# Patient Record
Sex: Female | Born: 1940 | Race: Black or African American | Hispanic: No | Smoking: Former smoker
Health system: Southern US, Community
[De-identification: ages and names within clinical notes are randomized; demographics above are authoritative.]

## PROBLEM LIST (undated history)

## (undated) DIAGNOSIS — K209 Esophagitis, unspecified without bleeding: Secondary | ICD-10-CM

## (undated) DIAGNOSIS — D860 Sarcoidosis of lung: Secondary | ICD-10-CM

## (undated) DIAGNOSIS — J449 Chronic obstructive pulmonary disease, unspecified: Secondary | ICD-10-CM

## (undated) DIAGNOSIS — E119 Type 2 diabetes mellitus without complications: Secondary | ICD-10-CM

## (undated) DIAGNOSIS — E669 Obesity, unspecified: Secondary | ICD-10-CM

## (undated) DIAGNOSIS — D649 Anemia, unspecified: Secondary | ICD-10-CM

## (undated) DIAGNOSIS — M1990 Unspecified osteoarthritis, unspecified site: Secondary | ICD-10-CM

## (undated) DIAGNOSIS — I509 Heart failure, unspecified: Secondary | ICD-10-CM

## (undated) DIAGNOSIS — M51369 Other intervertebral disc degeneration, lumbar region without mention of lumbar back pain or lower extremity pain: Secondary | ICD-10-CM

## (undated) DIAGNOSIS — N189 Chronic kidney disease, unspecified: Secondary | ICD-10-CM

## (undated) DIAGNOSIS — I10 Essential (primary) hypertension: Secondary | ICD-10-CM

## (undated) DIAGNOSIS — R0600 Dyspnea, unspecified: Secondary | ICD-10-CM

## (undated) DIAGNOSIS — H269 Unspecified cataract: Secondary | ICD-10-CM

## (undated) DIAGNOSIS — G4730 Sleep apnea, unspecified: Secondary | ICD-10-CM

## (undated) DIAGNOSIS — M109 Gout, unspecified: Secondary | ICD-10-CM

## (undated) DIAGNOSIS — M5136 Other intervertebral disc degeneration, lumbar region: Secondary | ICD-10-CM

## (undated) HISTORY — PX: OTHER SURGICAL HISTORY: SHX169

## (undated) HISTORY — DX: Type 2 diabetes mellitus without complications: E11.9

## (undated) HISTORY — PX: CATARACT EXTRACTION: SUR2

## (undated) HISTORY — DX: Sarcoidosis of lung: D86.0

## (undated) HISTORY — DX: Esophagitis, unspecified: K20.9

## (undated) HISTORY — DX: Unspecified osteoarthritis, unspecified site: M19.90

## (undated) HISTORY — DX: Unspecified cataract: H26.9

## (undated) HISTORY — PX: OOPHORECTOMY: SHX86

## (undated) HISTORY — DX: Gout, unspecified: M10.9

## (undated) HISTORY — DX: Essential (primary) hypertension: I10

## (undated) HISTORY — DX: Esophagitis, unspecified without bleeding: K20.90

## (undated) HISTORY — DX: Sleep apnea, unspecified: G47.30

## (undated) HISTORY — DX: Obesity, unspecified: E66.9

## (undated) HISTORY — DX: Anemia, unspecified: D64.9

## (undated) HISTORY — DX: Chronic obstructive pulmonary disease, unspecified: J44.9

## (undated) HISTORY — PX: ABDOMINAL HYSTERECTOMY: SHX81

## (undated) HISTORY — DX: Heart failure, unspecified: I50.9

## (undated) HISTORY — PX: FOOT SURGERY: SHX648

---

## 2003-01-09 ENCOUNTER — Encounter: Admission: RE | Admit: 2003-01-09 | Discharge: 2003-01-09 | Payer: Self-pay | Admitting: Internal Medicine

## 2003-04-21 ENCOUNTER — Ambulatory Visit (HOSPITAL_BASED_OUTPATIENT_CLINIC_OR_DEPARTMENT_OTHER): Admission: RE | Admit: 2003-04-21 | Discharge: 2003-04-21 | Payer: Self-pay | Admitting: Pulmonary Disease

## 2003-08-17 ENCOUNTER — Encounter: Admission: RE | Admit: 2003-08-17 | Discharge: 2003-08-17 | Payer: Self-pay | Admitting: Anesthesiology

## 2004-02-21 ENCOUNTER — Ambulatory Visit: Payer: Self-pay | Admitting: Pulmonary Disease

## 2004-05-08 ENCOUNTER — Encounter: Admission: RE | Admit: 2004-05-08 | Discharge: 2004-05-08 | Payer: Self-pay | Admitting: Internal Medicine

## 2005-03-12 ENCOUNTER — Ambulatory Visit: Payer: Self-pay | Admitting: Pulmonary Disease

## 2005-08-04 ENCOUNTER — Ambulatory Visit: Payer: Self-pay | Admitting: Internal Medicine

## 2005-11-02 ENCOUNTER — Ambulatory Visit: Payer: Self-pay | Admitting: Unknown Physician Specialty

## 2005-11-02 HISTORY — PX: COLONOSCOPY: SHX174

## 2006-09-30 ENCOUNTER — Ambulatory Visit: Payer: Self-pay | Admitting: Internal Medicine

## 2007-06-02 ENCOUNTER — Encounter: Payer: Self-pay | Admitting: Internal Medicine

## 2007-10-04 ENCOUNTER — Ambulatory Visit: Payer: Self-pay | Admitting: Internal Medicine

## 2008-11-22 ENCOUNTER — Ambulatory Visit: Payer: Self-pay | Admitting: Internal Medicine

## 2008-12-21 ENCOUNTER — Ambulatory Visit: Payer: Self-pay | Admitting: Internal Medicine

## 2010-02-01 ENCOUNTER — Encounter: Payer: Self-pay | Admitting: Internal Medicine

## 2010-03-25 ENCOUNTER — Ambulatory Visit: Payer: Self-pay | Admitting: Otolaryngology

## 2011-02-13 ENCOUNTER — Ambulatory Visit: Payer: Self-pay | Admitting: Family Medicine

## 2011-02-24 ENCOUNTER — Ambulatory Visit: Payer: Self-pay | Admitting: Internal Medicine

## 2011-02-27 LAB — PATHOLOGY REPORT

## 2011-03-03 ENCOUNTER — Ambulatory Visit: Payer: Self-pay | Admitting: Cardiothoracic Surgery

## 2011-03-05 ENCOUNTER — Ambulatory Visit: Payer: Self-pay | Admitting: Cardiothoracic Surgery

## 2011-03-13 ENCOUNTER — Ambulatory Visit: Payer: Self-pay | Admitting: Cardiothoracic Surgery

## 2013-01-23 ENCOUNTER — Ambulatory Visit: Payer: Self-pay | Admitting: Family Medicine

## 2013-06-19 DIAGNOSIS — E119 Type 2 diabetes mellitus without complications: Secondary | ICD-10-CM | POA: Insufficient documentation

## 2013-06-19 DIAGNOSIS — I10 Essential (primary) hypertension: Secondary | ICD-10-CM | POA: Insufficient documentation

## 2013-07-17 ENCOUNTER — Ambulatory Visit: Payer: Self-pay | Admitting: Family Medicine

## 2014-01-08 ENCOUNTER — Emergency Department: Payer: Self-pay | Admitting: Emergency Medicine

## 2014-01-08 LAB — HEPATIC FUNCTION PANEL A (ARMC)
Albumin: 3 g/dL — ABNORMAL LOW (ref 3.4–5.0)
Alkaline Phosphatase: 130 U/L — ABNORMAL HIGH
Bilirubin, Direct: <0.1 mg/dL (ref 0.0–0.2)
Bilirubin,Total: 0.5 mg/dL (ref 0.2–1.0)
SGOT(AST): 17 U/L (ref 15–37)
SGPT (ALT): 54 U/L
Total Protein: 6.3 g/dL — ABNORMAL LOW (ref 6.4–8.2)

## 2014-01-08 LAB — BASIC METABOLIC PANEL
Anion Gap: 8 (ref 7–16)
BUN: 29 mg/dL — ABNORMAL HIGH (ref 7–18)
Calcium, Total: 8.3 mg/dL — ABNORMAL LOW (ref 8.5–10.1)
Chloride: 103 mmol/L (ref 98–107)
Co2: 27 mmol/L (ref 21–32)
Creatinine: 1.66 mg/dL — ABNORMAL HIGH (ref 0.60–1.30)
EGFR (African American): 39 — ABNORMAL LOW
EGFR (Non-African Amer.): 32 — ABNORMAL LOW
Glucose: 304 mg/dL — ABNORMAL HIGH (ref 65–99)
Osmolality: 293 (ref 275–301)
Potassium: 3.8 mmol/L (ref 3.5–5.1)
Sodium: 138 mmol/L (ref 136–145)

## 2014-01-08 LAB — CBC
HCT: 35.6 % (ref 35.0–47.0)
HGB: 11.6 g/dL — ABNORMAL LOW (ref 12.0–16.0)
MCH: 30.1 pg (ref 26.0–34.0)
MCHC: 32.4 g/dL (ref 32.0–36.0)
MCV: 93 fL (ref 80–100)
Platelet: 282 10*3/uL (ref 150–440)
RBC: 3.84 10*6/uL (ref 3.80–5.20)
RDW: 14 % (ref 11.5–14.5)
WBC: 6.5 10*3/uL (ref 3.6–11.0)

## 2014-01-08 LAB — LIPASE, BLOOD: Lipase: 110 U/L (ref 73–393)

## 2014-01-08 LAB — TROPONIN I: Troponin-I: 0.03 ng/mL

## 2014-01-26 ENCOUNTER — Ambulatory Visit: Payer: Self-pay | Admitting: Specialist

## 2014-01-26 DIAGNOSIS — D869 Sarcoidosis, unspecified: Secondary | ICD-10-CM | POA: Diagnosis not present

## 2014-01-26 DIAGNOSIS — I517 Cardiomegaly: Secondary | ICD-10-CM | POA: Diagnosis not present

## 2014-01-26 DIAGNOSIS — I2510 Atherosclerotic heart disease of native coronary artery without angina pectoris: Secondary | ICD-10-CM | POA: Diagnosis not present

## 2014-01-26 DIAGNOSIS — R918 Other nonspecific abnormal finding of lung field: Secondary | ICD-10-CM | POA: Diagnosis not present

## 2014-02-08 DIAGNOSIS — J449 Chronic obstructive pulmonary disease, unspecified: Secondary | ICD-10-CM | POA: Diagnosis not present

## 2014-02-08 DIAGNOSIS — Z79899 Other long term (current) drug therapy: Secondary | ICD-10-CM | POA: Diagnosis not present

## 2014-02-08 DIAGNOSIS — E119 Type 2 diabetes mellitus without complications: Secondary | ICD-10-CM | POA: Diagnosis not present

## 2014-02-08 DIAGNOSIS — I10 Essential (primary) hypertension: Secondary | ICD-10-CM | POA: Diagnosis not present

## 2014-02-08 DIAGNOSIS — D869 Sarcoidosis, unspecified: Secondary | ICD-10-CM | POA: Diagnosis not present

## 2014-02-09 DIAGNOSIS — J449 Chronic obstructive pulmonary disease, unspecified: Secondary | ICD-10-CM | POA: Diagnosis not present

## 2014-02-09 DIAGNOSIS — E669 Obesity, unspecified: Secondary | ICD-10-CM | POA: Diagnosis not present

## 2014-02-09 DIAGNOSIS — M109 Gout, unspecified: Secondary | ICD-10-CM | POA: Diagnosis not present

## 2014-02-09 DIAGNOSIS — I270 Primary pulmonary hypertension: Secondary | ICD-10-CM | POA: Diagnosis not present

## 2014-02-10 DIAGNOSIS — D869 Sarcoidosis, unspecified: Secondary | ICD-10-CM | POA: Diagnosis not present

## 2014-02-10 DIAGNOSIS — I517 Cardiomegaly: Secondary | ICD-10-CM | POA: Diagnosis not present

## 2014-02-10 DIAGNOSIS — J449 Chronic obstructive pulmonary disease, unspecified: Secondary | ICD-10-CM | POA: Diagnosis not present

## 2014-02-10 DIAGNOSIS — D860 Sarcoidosis of lung: Secondary | ICD-10-CM | POA: Diagnosis not present

## 2014-02-10 DIAGNOSIS — I10 Essential (primary) hypertension: Secondary | ICD-10-CM | POA: Diagnosis not present

## 2014-02-10 DIAGNOSIS — R0602 Shortness of breath: Secondary | ICD-10-CM | POA: Diagnosis not present

## 2014-02-10 DIAGNOSIS — E119 Type 2 diabetes mellitus without complications: Secondary | ICD-10-CM | POA: Diagnosis not present

## 2014-02-14 DIAGNOSIS — D869 Sarcoidosis, unspecified: Secondary | ICD-10-CM | POA: Diagnosis not present

## 2014-02-14 DIAGNOSIS — I130 Hypertensive heart and chronic kidney disease with heart failure and stage 1 through stage 4 chronic kidney disease, or unspecified chronic kidney disease: Secondary | ICD-10-CM | POA: Diagnosis not present

## 2014-02-14 DIAGNOSIS — J410 Simple chronic bronchitis: Secondary | ICD-10-CM | POA: Diagnosis not present

## 2014-02-14 DIAGNOSIS — R609 Edema, unspecified: Secondary | ICD-10-CM | POA: Diagnosis not present

## 2014-02-14 DIAGNOSIS — I10 Essential (primary) hypertension: Secondary | ICD-10-CM | POA: Diagnosis not present

## 2014-02-14 DIAGNOSIS — G4733 Obstructive sleep apnea (adult) (pediatric): Secondary | ICD-10-CM | POA: Diagnosis not present

## 2014-02-14 DIAGNOSIS — J449 Chronic obstructive pulmonary disease, unspecified: Secondary | ICD-10-CM | POA: Diagnosis not present

## 2014-02-14 DIAGNOSIS — I159 Secondary hypertension, unspecified: Secondary | ICD-10-CM | POA: Diagnosis not present

## 2014-02-14 DIAGNOSIS — E119 Type 2 diabetes mellitus without complications: Secondary | ICD-10-CM | POA: Diagnosis not present

## 2014-02-14 DIAGNOSIS — D649 Anemia, unspecified: Secondary | ICD-10-CM | POA: Diagnosis not present

## 2014-02-14 DIAGNOSIS — J811 Chronic pulmonary edema: Secondary | ICD-10-CM | POA: Diagnosis not present

## 2014-02-14 DIAGNOSIS — I5033 Acute on chronic diastolic (congestive) heart failure: Secondary | ICD-10-CM | POA: Diagnosis not present

## 2014-02-14 DIAGNOSIS — Z6841 Body Mass Index (BMI) 40.0 and over, adult: Secondary | ICD-10-CM | POA: Diagnosis not present

## 2014-02-14 DIAGNOSIS — E1165 Type 2 diabetes mellitus with hyperglycemia: Secondary | ICD-10-CM | POA: Diagnosis not present

## 2014-02-14 DIAGNOSIS — R0602 Shortness of breath: Secondary | ICD-10-CM | POA: Diagnosis not present

## 2014-02-14 DIAGNOSIS — R001 Bradycardia, unspecified: Secondary | ICD-10-CM | POA: Diagnosis not present

## 2014-02-14 DIAGNOSIS — J90 Pleural effusion, not elsewhere classified: Secondary | ICD-10-CM | POA: Diagnosis not present

## 2014-02-20 DIAGNOSIS — R0609 Other forms of dyspnea: Secondary | ICD-10-CM | POA: Insufficient documentation

## 2014-02-27 DIAGNOSIS — I519 Heart disease, unspecified: Secondary | ICD-10-CM | POA: Diagnosis not present

## 2014-02-27 DIAGNOSIS — I10 Essential (primary) hypertension: Secondary | ICD-10-CM | POA: Diagnosis not present

## 2014-02-27 DIAGNOSIS — I5189 Other ill-defined heart diseases: Secondary | ICD-10-CM | POA: Insufficient documentation

## 2014-03-01 DIAGNOSIS — I519 Heart disease, unspecified: Secondary | ICD-10-CM | POA: Diagnosis not present

## 2014-03-01 DIAGNOSIS — D869 Sarcoidosis, unspecified: Secondary | ICD-10-CM | POA: Diagnosis not present

## 2014-03-01 DIAGNOSIS — D649 Anemia, unspecified: Secondary | ICD-10-CM | POA: Diagnosis not present

## 2014-03-01 DIAGNOSIS — I10 Essential (primary) hypertension: Secondary | ICD-10-CM | POA: Diagnosis not present

## 2014-03-01 DIAGNOSIS — I509 Heart failure, unspecified: Secondary | ICD-10-CM | POA: Diagnosis not present

## 2014-03-01 DIAGNOSIS — J9811 Atelectasis: Secondary | ICD-10-CM | POA: Diagnosis not present

## 2014-03-02 DIAGNOSIS — G4733 Obstructive sleep apnea (adult) (pediatric): Secondary | ICD-10-CM | POA: Diagnosis not present

## 2014-03-05 DIAGNOSIS — Z1231 Encounter for screening mammogram for malignant neoplasm of breast: Secondary | ICD-10-CM | POA: Diagnosis not present

## 2014-03-12 DIAGNOSIS — E669 Obesity, unspecified: Secondary | ICD-10-CM | POA: Diagnosis not present

## 2014-03-12 DIAGNOSIS — J449 Chronic obstructive pulmonary disease, unspecified: Secondary | ICD-10-CM | POA: Diagnosis not present

## 2014-03-12 DIAGNOSIS — I270 Primary pulmonary hypertension: Secondary | ICD-10-CM | POA: Diagnosis not present

## 2014-03-12 DIAGNOSIS — M109 Gout, unspecified: Secondary | ICD-10-CM | POA: Diagnosis not present

## 2014-03-13 DIAGNOSIS — I519 Heart disease, unspecified: Secondary | ICD-10-CM | POA: Diagnosis not present

## 2014-03-13 DIAGNOSIS — I10 Essential (primary) hypertension: Secondary | ICD-10-CM | POA: Diagnosis not present

## 2014-03-13 DIAGNOSIS — R0609 Other forms of dyspnea: Secondary | ICD-10-CM | POA: Diagnosis not present

## 2014-03-14 DIAGNOSIS — D869 Sarcoidosis, unspecified: Secondary | ICD-10-CM | POA: Diagnosis not present

## 2014-03-14 DIAGNOSIS — R0602 Shortness of breath: Secondary | ICD-10-CM | POA: Diagnosis not present

## 2014-03-27 ENCOUNTER — Encounter
Admit: 2014-03-27 | Disposition: A | Discharge status: Home / Self Care | Payer: Self-pay | Attending: Specialist | Admitting: Specialist

## 2014-03-27 DIAGNOSIS — D869 Sarcoidosis, unspecified: Secondary | ICD-10-CM | POA: Diagnosis not present

## 2014-03-27 DIAGNOSIS — J449 Chronic obstructive pulmonary disease, unspecified: Secondary | ICD-10-CM | POA: Diagnosis not present

## 2014-03-27 DIAGNOSIS — H449 Unspecified disorder of globe: Secondary | ICD-10-CM | POA: Diagnosis not present

## 2014-03-30 DIAGNOSIS — D869 Sarcoidosis, unspecified: Secondary | ICD-10-CM | POA: Diagnosis not present

## 2014-03-30 DIAGNOSIS — J449 Chronic obstructive pulmonary disease, unspecified: Secondary | ICD-10-CM | POA: Diagnosis not present

## 2014-03-30 DIAGNOSIS — H449 Unspecified disorder of globe: Secondary | ICD-10-CM | POA: Diagnosis not present

## 2014-04-02 DIAGNOSIS — D869 Sarcoidosis, unspecified: Secondary | ICD-10-CM | POA: Diagnosis not present

## 2014-04-02 DIAGNOSIS — H449 Unspecified disorder of globe: Secondary | ICD-10-CM | POA: Diagnosis not present

## 2014-04-02 DIAGNOSIS — J449 Chronic obstructive pulmonary disease, unspecified: Secondary | ICD-10-CM | POA: Diagnosis not present

## 2014-04-04 DIAGNOSIS — D869 Sarcoidosis, unspecified: Secondary | ICD-10-CM | POA: Diagnosis not present

## 2014-04-04 DIAGNOSIS — J449 Chronic obstructive pulmonary disease, unspecified: Secondary | ICD-10-CM | POA: Diagnosis not present

## 2014-04-04 DIAGNOSIS — H449 Unspecified disorder of globe: Secondary | ICD-10-CM | POA: Diagnosis not present

## 2014-04-06 DIAGNOSIS — J449 Chronic obstructive pulmonary disease, unspecified: Secondary | ICD-10-CM | POA: Diagnosis not present

## 2014-04-06 DIAGNOSIS — H449 Unspecified disorder of globe: Secondary | ICD-10-CM | POA: Diagnosis not present

## 2014-04-06 DIAGNOSIS — D869 Sarcoidosis, unspecified: Secondary | ICD-10-CM | POA: Diagnosis not present

## 2014-04-09 DIAGNOSIS — D869 Sarcoidosis, unspecified: Secondary | ICD-10-CM | POA: Diagnosis not present

## 2014-04-09 DIAGNOSIS — J449 Chronic obstructive pulmonary disease, unspecified: Secondary | ICD-10-CM | POA: Diagnosis not present

## 2014-04-09 DIAGNOSIS — H449 Unspecified disorder of globe: Secondary | ICD-10-CM | POA: Diagnosis not present

## 2014-04-10 DIAGNOSIS — M109 Gout, unspecified: Secondary | ICD-10-CM | POA: Diagnosis not present

## 2014-04-10 DIAGNOSIS — I270 Primary pulmonary hypertension: Secondary | ICD-10-CM | POA: Diagnosis not present

## 2014-04-10 DIAGNOSIS — E669 Obesity, unspecified: Secondary | ICD-10-CM | POA: Diagnosis not present

## 2014-04-10 DIAGNOSIS — J449 Chronic obstructive pulmonary disease, unspecified: Secondary | ICD-10-CM | POA: Diagnosis not present

## 2014-04-11 DIAGNOSIS — J449 Chronic obstructive pulmonary disease, unspecified: Secondary | ICD-10-CM | POA: Diagnosis not present

## 2014-04-11 DIAGNOSIS — D869 Sarcoidosis, unspecified: Secondary | ICD-10-CM | POA: Diagnosis not present

## 2014-04-11 DIAGNOSIS — H449 Unspecified disorder of globe: Secondary | ICD-10-CM | POA: Diagnosis not present

## 2014-04-13 ENCOUNTER — Encounter
Admit: 2014-04-13 | Disposition: A | Discharge status: Home / Self Care | Payer: Self-pay | Attending: Specialist | Admitting: Specialist

## 2014-04-13 DIAGNOSIS — J449 Chronic obstructive pulmonary disease, unspecified: Secondary | ICD-10-CM | POA: Diagnosis not present

## 2014-04-13 DIAGNOSIS — D689 Coagulation defect, unspecified: Secondary | ICD-10-CM | POA: Diagnosis not present

## 2014-04-16 DIAGNOSIS — D689 Coagulation defect, unspecified: Secondary | ICD-10-CM | POA: Diagnosis not present

## 2014-04-16 DIAGNOSIS — J449 Chronic obstructive pulmonary disease, unspecified: Secondary | ICD-10-CM | POA: Diagnosis not present

## 2014-04-18 DIAGNOSIS — J449 Chronic obstructive pulmonary disease, unspecified: Secondary | ICD-10-CM | POA: Diagnosis not present

## 2014-04-18 DIAGNOSIS — D689 Coagulation defect, unspecified: Secondary | ICD-10-CM | POA: Diagnosis not present

## 2014-04-20 DIAGNOSIS — D689 Coagulation defect, unspecified: Secondary | ICD-10-CM | POA: Diagnosis not present

## 2014-04-20 DIAGNOSIS — J449 Chronic obstructive pulmonary disease, unspecified: Secondary | ICD-10-CM | POA: Diagnosis not present

## 2014-04-25 DIAGNOSIS — D689 Coagulation defect, unspecified: Secondary | ICD-10-CM | POA: Diagnosis not present

## 2014-04-25 DIAGNOSIS — J449 Chronic obstructive pulmonary disease, unspecified: Secondary | ICD-10-CM | POA: Diagnosis not present

## 2014-04-26 ENCOUNTER — Other Ambulatory Visit: Payer: Self-pay

## 2014-04-26 DIAGNOSIS — I509 Heart failure, unspecified: Secondary | ICD-10-CM

## 2014-04-26 NOTE — Patient Outreach (Signed)
[place] [facility]) Care Management  04/26/2014  Mackenzie Hill Apr 30, 1940 [id number]  RN CM attempted outreach call to patient to discuss the services of [facility].  Patient unavailable and HIPPA compliant voice mail message left with return call back number.  RN CM will try to reach patient again at a later date.  Maury Dus, RN, Ishmael Holter, [place] Telephonic Care Coordinator [phone]

## 2014-04-26 NOTE — Patient Outreach (Signed)
[facility]) Care Management  04/26/2014  ANABELA MANGINELLI 06-Oct-1940 [id number]  Patient returned call and asked the reason for the call from [facility].  RN CM explained to patient that a referral was made by her insurance carrier, [employer].  RN CM talked with patient about the services of [facility].  Patient has new diagnosis of congestive heart failure.  RN CM asked patient what to talk her understanding of what congestive heart failure is.  Patient stated she does not know a lot.  States she knows her heart was pumping to slow and she was short of breath.  Patient went to [facility] and was told the diagnosis of congestive heart failure.  Patient states she goes to Pulmonology Rehab at [facility] on Mondays, Wednesdays, and Fridays from 10 am- 12 pm.  Patient admits that there is a lot to learn about this diagnosis.  States she is will to learn as much as she can.  Patient agrees to the services of [facility] and is agreeable to scheduling an appointment.  RN CM will make a referral for community nurse to educate patient on self-management of her chronic health condition of congestive heart failure.

## 2014-04-27 DIAGNOSIS — J449 Chronic obstructive pulmonary disease, unspecified: Secondary | ICD-10-CM | POA: Diagnosis not present

## 2014-04-27 DIAGNOSIS — D689 Coagulation defect, unspecified: Secondary | ICD-10-CM | POA: Diagnosis not present

## 2014-04-30 ENCOUNTER — Other Ambulatory Visit: Payer: Self-pay | Admitting: *Deleted

## 2014-04-30 DIAGNOSIS — D689 Coagulation defect, unspecified: Secondary | ICD-10-CM | POA: Diagnosis not present

## 2014-04-30 DIAGNOSIS — J449 Chronic obstructive pulmonary disease, unspecified: Secondary | ICD-10-CM | POA: Diagnosis not present

## 2014-04-30 NOTE — Patient Outreach (Signed)
Call made to home number. HIPPA compliant message left.  Plan: Call pt again tom to attempt to schedule an initial home visit. Rutherford Limerick RN, BSN  [facility] Care Management [phone])

## 2014-05-01 ENCOUNTER — Encounter: Payer: Self-pay | Admitting: *Deleted

## 2014-05-01 ENCOUNTER — Other Ambulatory Visit: Payer: Self-pay | Admitting: *Deleted

## 2014-05-01 VITALS — BP 120/60 | HR 54 | Resp 18 | Ht 68.0 in | Wt 234.0 lb

## 2014-05-01 DIAGNOSIS — I509 Heart failure, unspecified: Secondary | ICD-10-CM

## 2014-05-02 DIAGNOSIS — D689 Coagulation defect, unspecified: Secondary | ICD-10-CM | POA: Diagnosis not present

## 2014-05-02 DIAGNOSIS — J449 Chronic obstructive pulmonary disease, unspecified: Secondary | ICD-10-CM | POA: Diagnosis not present

## 2014-05-04 DIAGNOSIS — D689 Coagulation defect, unspecified: Secondary | ICD-10-CM | POA: Diagnosis not present

## 2014-05-04 DIAGNOSIS — J449 Chronic obstructive pulmonary disease, unspecified: Secondary | ICD-10-CM | POA: Diagnosis not present

## 2014-05-04 NOTE — Patient Outreach (Signed)
[facility]) Care Management   05/04/2014  Mackenzie Hill [birth year] [id number]  Mackenzie Hill is an 74 y.o. female  Subjective: " I want to learn more about heart failure because I had to stay in the hospital a long time and I don't want to have to do that again."   Objective:  Today's Vitals   05/01/14 1651 05/01/14 1652  BP: 120/60   Pulse: 54   Resp:  18  Height:  1.727 m (5\' 8" )  Weight:  234 lb (106.142 kg)  SpO2: 98%     Review of Systems  Constitutional: Positive for weight loss.  All other systems reviewed and are negative.   Physical Exam  Constitutional: She is oriented to person, place, and time. She appears well-developed and well-nourished.  Cardiovascular: Regular rhythm and intact distal pulses.  Bradycardia present.   Pulses:      Popliteal pulses are 2+ on the right side, and 2+ on the left side.  Heart rate noted to be low; No swelling to bilateral lower extremities.   Respiratory: Effort normal and breath sounds normal.  GI: Soft. Bowel sounds are normal.  Musculoskeletal: Normal range of motion.  Neurological: She is alert and oriented to person, place, and time.  Skin: Skin is warm and dry.  Skin integrity checked to bilateral feet per diabetic protocol, skin noted intact.   Psychiatric: She has a normal mood and affect. Thought content normal.    Current Medications:   Current Outpatient Prescriptions  Medication Sig Dispense Refill  . ALBUTEROL SULFATE IN Inhale 2.5 mg into the lungs daily as needed.    Marland Kitchen amLODipine (NORVASC) 10 MG tablet Take 10 mg by mouth daily.    . budesonide-formoterol (SYMBICORT) 80-4.5 MCG/ACT inhaler Inhale 2 puffs into the lungs as needed.    . Calcium Carb-Cholecalciferol (CALCIUM + D3) 600-200 MG-UNIT TABS Take 600 mg by mouth 2 (two) times daily.    . carvedilol (COREG) 12.5 MG tablet Take 12.5 mg by mouth 2 (two) times daily with a meal.    . fexofenadine (ALLEGRA) 180 MG tablet Take 180 mg by  mouth daily.    . furosemide (LASIX) 20 MG tablet Take 20 mg by mouth 2 (two) times daily.    Marland Kitchen glipiZIDE (GLUCOTROL XL) 10 MG 24 hr tablet Take 10 mg by mouth 2 (two) times daily.    . isosorbide mononitrate (IMDUR) 60 MG 24 hr tablet Take 180 mg by mouth daily.    Marland Kitchen losartan (COZAAR) 100 MG tablet Take 100 mg by mouth daily.    . metFORMIN (GLUCOPHAGE) 1000 MG tablet Take 1,000 mg by mouth daily with breakfast.    . metFORMIN (GLUCOPHAGE) 1000 MG tablet Take 1,500 mg by mouth every evening.    . mometasone (NASONEX) 50 MCG/ACT nasal spray Place 2 sprays into the nose daily as needed.    . montelukast (SINGULAIR) 10 MG tablet Take 10 mg by mouth daily.    . sitaGLIPtin (JANUVIA) 100 MG tablet Take 100 mg by mouth daily.    Marland Kitchen spironolactone (ALDACTONE) 25 MG tablet Take 25 mg by mouth daily.    . vitamin B-12 (CYANOCOBALAMIN) 1000 MCG tablet Take 1,000 mcg by mouth daily.     No current facility-administered medications for this visit.    Functional Status:   In your present state of health, do you have any difficulty performing the following activities: 05/01/2014  Hearing? N  Vision? N  Difficulty concentrating or making decisions?  N  Walking or climbing stairs? N  Dressing or bathing? N  Doing errands, shopping? N  Preparing Food and eating ? N  Using the Toilet? N  In the past six months, have you accidently leaked urine? N  Do you have problems with loss of bowel control? N  Managing your Medications? N  Managing your Finances? N  Housekeeping or managing your Housekeeping? N    Fall/Depression Screening:    PHQ 2/9 Scores 05/01/2014  PHQ - 2 Score 0   Current Outpatient Prescriptions  Medication Sig Dispense Refill  . ALBUTEROL SULFATE IN Inhale 2.5 mg into the lungs daily as needed.    Marland Kitchen amLODipine (NORVASC) 10 MG tablet Take 10 mg by mouth daily.    . budesonide-formoterol (SYMBICORT) 80-4.5 MCG/ACT inhaler Inhale 2 puffs into the lungs as needed.    . Calcium  Carb-Cholecalciferol (CALCIUM + D3) 600-200 MG-UNIT TABS Take 600 mg by mouth 2 (two) times daily.    . carvedilol (COREG) 12.5 MG tablet Take 12.5 mg by mouth 2 (two) times daily with a meal.    . fexofenadine (ALLEGRA) 180 MG tablet Take 180 mg by mouth daily.    . furosemide (LASIX) 20 MG tablet Take 20 mg by mouth 2 (two) times daily.    Marland Kitchen glipiZIDE (GLUCOTROL XL) 10 MG 24 hr tablet Take 10 mg by mouth 2 (two) times daily.    . isosorbide mononitrate (IMDUR) 60 MG 24 hr tablet Take 180 mg by mouth daily.    Marland Kitchen losartan (COZAAR) 100 MG tablet Take 100 mg by mouth daily.    . metFORMIN (GLUCOPHAGE) 1000 MG tablet Take 1,000 mg by mouth daily with breakfast.    . metFORMIN (GLUCOPHAGE) 1000 MG tablet Take 1,500 mg by mouth every evening.    . mometasone (NASONEX) 50 MCG/ACT nasal spray Place 2 sprays into the nose daily as needed.    . montelukast (SINGULAIR) 10 MG tablet Take 10 mg by mouth daily.    . sitaGLIPtin (JANUVIA) 100 MG tablet Take 100 mg by mouth daily.    Marland Kitchen spironolactone (ALDACTONE) 25 MG tablet Take 25 mg by mouth daily.    . vitamin B-12 (CYANOCOBALAMIN) 1000 MCG tablet Take 1,000 mcg by mouth daily.     No current facility-administered medications for this visit.   [facility] CM Care Plan Problem One        Patient Outreach from 05/01/2014 in [place] Problem One  Knowlege deficit of HF as evidenced by pt stating "I need to know more about heart faillure."   Care Plan for Problem One  Active   [facility] Long Term Goal Start Date  05/01/14   Interventions for Problem One Long Term Goal  Pt given heart failure packet, RNCM and pt discussed the HF danger zones and when to notify the MD.   [facility] CM Short Term Goal #1 (0-30 days)  Pt will be able to verbalize HF zones without looking at the education sheet, allowing her to recognize s/s of HF while away from home by RNCM's next visit in 30 days   [facility] CM Short Term Goal #1 Start Date  05/01/14   Interventions for  Short Term Goal #1  Heart failure zone education sheet given. RNCM and pt discussed zones and their meanings. Pt verbalized understanding and motivation to want to be very familiar with s/s of  HF   [facility] CM Short Term Goal #2 (0-30 days)  Pt will  regain the strength to work in her yard, starting on Tuesdays and Thursdays, off days of pulmonary rehab in the next 30 days.   [facility] CM Short Term Goal #2 Start Date  05/01/14   Interventions for Short Term Goal #2  RNCM and pt discussed pt's love of working in her yard and how to safely exercise, recognizing the s/s of needing to rest. Pt given EMMI education on HF which addressed HF and safe exercise.     Assessment:   HF: Pt with self proclaimed knowledge deficit. Alert oriented and pleasant mood. Willing participant in education and measures to get well as evidenced by attendance to pulmonary rehab and noted organization of medical information about disease and medications. Lungs clear, no SOB noted, bilat lower extremities with out swelling. Skin to lower extremities noted to be warm dry with +2 pulses. Heart rate noted to be in the lower 50's which the pt stated is her normal.   Plan:  Pt to read and be familiar with the HF zones. Pt to read EMMI printed EMMI information about being salt smart and exercising safely.  RNCM placed pharmacy consult related to high pill burden of greater than 16 daily medications. RNCM will email pt EMMI education on living with HF. RNCM scheduled a follow up visit on 5/17 RNCM will make J. Sparks MD aware of [facility] involvement.   Rutherford Limerick RN, BSN  [facility] Care Management [phone])

## 2014-05-07 ENCOUNTER — Encounter: Payer: Self-pay | Admitting: *Deleted

## 2014-05-07 DIAGNOSIS — J449 Chronic obstructive pulmonary disease, unspecified: Secondary | ICD-10-CM | POA: Diagnosis not present

## 2014-05-07 DIAGNOSIS — Z7189 Other specified counseling: Secondary | ICD-10-CM

## 2014-05-07 DIAGNOSIS — D689 Coagulation defect, unspecified: Secondary | ICD-10-CM | POA: Diagnosis not present

## 2014-05-07 NOTE — Addendum Note (Signed)
Addended by: Gurney Maxin on: 05/07/2014 07:17 PM   Modules accepted: Orders

## 2014-05-08 DIAGNOSIS — H52221 Regular astigmatism, right eye: Secondary | ICD-10-CM | POA: Diagnosis not present

## 2014-05-08 DIAGNOSIS — H4011X1 Primary open-angle glaucoma, mild stage: Secondary | ICD-10-CM | POA: Diagnosis not present

## 2014-05-08 DIAGNOSIS — I10 Essential (primary) hypertension: Secondary | ICD-10-CM | POA: Diagnosis not present

## 2014-05-08 DIAGNOSIS — H5211 Myopia, right eye: Secondary | ICD-10-CM | POA: Diagnosis not present

## 2014-05-08 DIAGNOSIS — H43812 Vitreous degeneration, left eye: Secondary | ICD-10-CM | POA: Diagnosis not present

## 2014-05-08 DIAGNOSIS — E119 Type 2 diabetes mellitus without complications: Secondary | ICD-10-CM | POA: Diagnosis not present

## 2014-05-08 DIAGNOSIS — H524 Presbyopia: Secondary | ICD-10-CM | POA: Diagnosis not present

## 2014-05-08 DIAGNOSIS — H25813 Combined forms of age-related cataract, bilateral: Secondary | ICD-10-CM | POA: Diagnosis not present

## 2014-05-09 DIAGNOSIS — J449 Chronic obstructive pulmonary disease, unspecified: Secondary | ICD-10-CM | POA: Diagnosis not present

## 2014-05-09 DIAGNOSIS — D689 Coagulation defect, unspecified: Secondary | ICD-10-CM | POA: Diagnosis not present

## 2014-05-10 DIAGNOSIS — I10 Essential (primary) hypertension: Secondary | ICD-10-CM | POA: Diagnosis not present

## 2014-05-10 DIAGNOSIS — G4733 Obstructive sleep apnea (adult) (pediatric): Secondary | ICD-10-CM | POA: Insufficient documentation

## 2014-05-10 DIAGNOSIS — Z9989 Dependence on other enabling machines and devices: Secondary | ICD-10-CM | POA: Insufficient documentation

## 2014-05-10 DIAGNOSIS — D869 Sarcoidosis, unspecified: Secondary | ICD-10-CM | POA: Diagnosis not present

## 2014-05-10 DIAGNOSIS — Z79899 Other long term (current) drug therapy: Secondary | ICD-10-CM | POA: Diagnosis not present

## 2014-05-10 DIAGNOSIS — E119 Type 2 diabetes mellitus without complications: Secondary | ICD-10-CM | POA: Diagnosis not present

## 2014-05-10 DIAGNOSIS — D649 Anemia, unspecified: Secondary | ICD-10-CM | POA: Diagnosis not present

## 2014-05-11 ENCOUNTER — Other Ambulatory Visit: Payer: Self-pay | Admitting: Pharmacist

## 2014-05-11 DIAGNOSIS — I270 Primary pulmonary hypertension: Secondary | ICD-10-CM | POA: Diagnosis not present

## 2014-05-11 DIAGNOSIS — E669 Obesity, unspecified: Secondary | ICD-10-CM | POA: Diagnosis not present

## 2014-05-11 DIAGNOSIS — J449 Chronic obstructive pulmonary disease, unspecified: Secondary | ICD-10-CM | POA: Diagnosis not present

## 2014-05-11 DIAGNOSIS — M109 Gout, unspecified: Secondary | ICD-10-CM | POA: Diagnosis not present

## 2014-05-11 NOTE — Patient Outreach (Signed)
Mackenzie Hill is a 74 y.o. female referred to pharmacy for a medication review.  Reviewed medication list with patient.   Drugs sorted by system:  Cardiovascular: amlodipine, carvedilol, furosemide, isosorbide mononitrate ER, losartan, aldactone  Pulmonary/Allergy: albuterol, Symbicort, fexofenadine, Nasonex, montelukast  Spoke with patient about her Symbicort inhaler. Patient reports that she has been using it, as described in the directions, on an as needed basis. Spoke to patient that this is not a rescue inhaler and about the importance of using this inhaler on a scheduled basis for it to be effective. Informed patient that I would follow up with her PCP to confirm if she should be using this on a scheduled basis twice daily. Patient is rinsing her mouth after each use.   Patient reports that she rarely needs her albuterol.  Endocrine: glipizide, metformin, Januvia  Current metformin total daily dose is 2,500 mg, maximum daily dose. Patient's last reported renal function labs in [place] from 03/01/14 reflect a GFR of 36 mL/minute and SCr of 1.7.  Metformin dose reduction is recommended with renal impairment, due to risk of lactic acidosis.  Miscellaneous: calcium + vitamin D, Vitamin B12   Patient reports that she is taking each medication as directed. She is organizing her medications in two weekly pillboxes, one for morning and one for evening doses. Patient reports that she manages her own medications and reports no missed doses.   Discussed medication affordability with patient. Discussed using [employer] for lower copays to patient. Verified that patient's brand name medications, Symbicort and Januvia, do not have a lower tier formulary alternative.   Plan: I will follow up with patient's PCP, Fulton Reek, about patient's most recent GFR and A1C. If patient's GFR remains ?30 to <45 mL/minute, will suggest a dose reduction of her metformin to a total daily dose  of 1000 mg. Will also discuss with the provider patient's Symbicort dosing.   Provided patient with my phone number and let her know that I will follow up with her at the beginning of next week, once I have spoken with her provider.  Harlow Asa, PharmD Clinical Pharmacist [place] Management [phone]

## 2014-05-11 NOTE — Patient Outreach (Signed)
Called to follow up with patient's PCP, Fulton Reek, about patient's most recent GFR and A1C, as well as patient's metformin and Symbicort dosing. Left message with Marlowe Kays. If have not heard from provider by 05/14/14, will follow up with him again at that time.  Harlow Asa, PharmD Clinical Pharmacist [place] Management [phone]

## 2014-05-11 NOTE — Patient Outreach (Signed)
Received a call back from nurse, Olivia Mackie, at Dr. Stacie Glaze office. Olivia Mackie reports that Ms. Test's most recent Scr was 1.8. Olivia Mackie states that regarding the Symbicort, the patient's dose is to be 2 puffs twice daily, not as needed.   Olivia Mackie states that she will have Dr. Doy Hutching call me back about the renal dosing. Given patient's Scr of 1.8 and then estimated creatinine clearance of 35 ml/min, have recommended a dose reduction of Ms. Davidovich's metformin to 500 mg twice daily and her Januvia to 50 mg daily.   Also asked for the most recent labs to be sent to [facility], fax number provided.   If I have not heard back from the provider by 05/14/14, will follow up at that time.   Harlow Asa, PharmD Clinical Pharmacist [place] Management [phone]

## 2014-05-14 ENCOUNTER — Encounter: Payer: Commercial Managed Care - HMO | Attending: Internal Medicine

## 2014-05-14 ENCOUNTER — Other Ambulatory Visit: Payer: Self-pay | Admitting: Pharmacist

## 2014-05-14 DIAGNOSIS — D869 Sarcoidosis, unspecified: Secondary | ICD-10-CM | POA: Insufficient documentation

## 2014-05-14 NOTE — Progress Notes (Signed)
Daily Session Note  Patient Details  Name: Makaylen M Zalesky MRN: [id number] Date of Birth: 08/02/1940 Referring Provider:  Fleming, Herbon E, MD  Encounter Date: 05/14/2014  Check In:     Session Check In - 05/14/14 1227    Check-In   Staff Present Laureen Brown BS, RRT, Respiratory Therapist    Steven Way BS, ACSM EP-C, Exercise Physiologist   ER physicians immediately available to respond to emergencies [facility] immediately available ER MD   Physician(s) Stafford and Kinner   Warm-up and Cool-down Performed on first and last piece of equipment   VAD Patient? No   Pain Assessment   Currently in Pain? No/denies         Goals Met:  Proper associated with RPD/PD & O2 Sat Exercise tolerated well  Goals Unmet:  Not Applicable  Goals Comments:    Dr. Mark Miller is Medical Director for [facility] and [facility]. 

## 2014-05-14 NOTE — Patient Outreach (Signed)
Called again to Dr. Doy Hutching office to follow up about the renal dosing of patient's metformin and Januvia. Given patient's Scr of 1.8 and then estimated creatinine clearance of 35 ml/min, have recommended a dose reduction of Mackenzie Hill's metformin to 500 mg twice daily and her Januvia to 50 mg daily.    If I have not heard back from the provider by 05/16/14, will follow up at that time.  Harlow Asa, PharmD Clinical Pharmacist [place] Management [phone]

## 2014-05-15 ENCOUNTER — Other Ambulatory Visit: Payer: Self-pay | Admitting: *Deleted

## 2014-05-15 NOTE — Patient Outreach (Signed)
[facility] [facility]) Care Management  05/15/2014  Mackenzie Hill 1940/10/07 [id number]    Phone call to patient to schedule home visit to assist with Advanced Directive completion and mental health resources.  HIPPA compliant voice mail message left for a return call.   [name] [facility] Care Management [phone]

## 2014-05-16 ENCOUNTER — Encounter: Payer: Commercial Managed Care - HMO | Admitting: *Deleted

## 2014-05-16 ENCOUNTER — Encounter: Payer: Self-pay | Admitting: Pharmacist

## 2014-05-16 ENCOUNTER — Other Ambulatory Visit: Payer: Self-pay | Admitting: Pharmacist

## 2014-05-16 DIAGNOSIS — D869 Sarcoidosis, unspecified: Secondary | ICD-10-CM

## 2014-05-16 NOTE — Patient Outreach (Signed)
Called again to Dr. Doy Hutching' office to follow up about the renal dosing of patient's metformin and Januvia. Given patient's Scr of 1.8 and then estimated creatinine clearance of 35 ml/min, have recommended a dose reduction of Ms. Lal's metformin to 500 mg twice daily and her Januvia to 50 mg daily.   As this is the third day that I have tried to get in touch with the provider, I will now send a letter to the provider to address these concerns.  Harlow Asa, PharmD Clinical Pharmacist [place] Management [phone]

## 2014-05-16 NOTE — Patient Outreach (Signed)
Called to let Ms. Nogales know that I have spoken to and left messages with Dr. Doy Hutching office about her Symbicort, metformin and Januvia. Let Ms. Cashion know that she should be taking her Symbicort 2 puffs twice daily, not as needed as her current prescription label reads. Also let Ms. Allio know that I have contacted Dr. Doy Hutching by telephone messages and a letter about her metformin and Januvia because I am concerned about the dosing given her current kidney function.   Ms. Ziller let me know that her blood sugar has been on the low side. Patient reports that she recently had a low of 67 and felt badly. Patient reports that she had not missed any meals, just had the low. Reports that when she had the low she did drink some juice to bring her blood sugar back up. Counseled patient on the importance of treating a low by taking a quick acting source of sugar like she did with the juice, then rechecking blood sugar in 15 minutes and repeating until blood sugar returns to normal and then having a snack with protein. Ms. Kassay expressed understanding. Advised patient to contact her PCP, Dr. Doy Hutching, today to let him know about her lower blood sugars and to report those numbers to him. Patient reports that she will call Dr. Doy Hutching office as soon as we get off of the phone. Will follow up with Ms. Smyth next week.   Harlow Asa, PharmD Clinical Pharmacist [place] Management [phone]

## 2014-05-16 NOTE — Patient Outreach (Signed)
Ms. Browe called me back to let me know that she had spoken with her doctor's office about her blood sugar and how she was feeling. Reported that she was instructed by her physician's office to reduce her amlodipine dose to 5 mg/day, her metformin to 500 mg twice daily and her Januvia to 50 mg daily. Confirmed for patient that she can cut her metformin and amlodipine. However, as patient's amlodipine is not scored, recommended that patient purchase a pill cutter from her pharmacy. Advised patient that the Januvia tablets should not be cut. Patient to call physician's office back to request a new prescription for the Januvia 50 mg.   Reviewed again with patient that after treating a low blood sugar, she should have a snack or meal with protein, such as peanut butter and crackers to ensure that her blood sugar does not drop again. Also spoke with patient about checking her blood sugar before bedtime.  Let patient know that I would call to follow up with her on Monday, 05/21/14.  Harlow Asa, PharmD Clinical Pharmacist [place] Management [phone]

## 2014-05-16 NOTE — Progress Notes (Signed)
Daily Session Note  Patient Details  Name: JOHNNI WUNSCHEL MRN: [id number] Date of Birth: 1940-03-03 Referring Provider:  Idelle Crouch, MD  Encounter Date: 05/16/2014  Check In:     Session Check In - 05/16/14 1134    Check-In   Staff Present Carson Myrtle BS,RRT, Respiratory Therapist; Lestine Box BS, ACSM EP-C, Exercise Physiologist; Candiss Norse MS, ACSM CEP, Exercise Physiologist   ER physicians immediately available to respond to emergencies [facility] immediately available ER MD   Physician(s) Drs: Katrine Coho, and Archie Balboa.   Warm-up and Cool-down Performed on first and last piece of equipment   VAD Patient? No   Pain Assessment   Currently in Pain? No/denies   Multiple Pain Sites No           Exercise Prescription Changes - 05/16/14 1100    Exercise Review   Progression Yes   NuStep   Level 5   Watts 55      Goals Met:  Proper associated with RPD/PD & O2 Sat Independence with exercise equipment Exercise tolerated well Personal goals reviewed  Goals Unmet:  Not Applicable  Goals Comments:    Dr. Emily Filbert is Medical Director for [place] and [facility].

## 2014-05-17 ENCOUNTER — Other Ambulatory Visit: Payer: Self-pay | Admitting: *Deleted

## 2014-05-17 NOTE — Patient Outreach (Signed)
Return call made to pt related to pt not recieving EMMI via e-mail. Re-checked e-mail on file and RNCM had written down wrong address. Pt also talked with RNCM about some recent med changes in which she was nervous about because it was related to her DM. She felt the medicine had been controlling her sugars well and was concerned the change would cause sugar increases. RNCM encouraged pt to continue to monitor sugar closely and be in contact with her physician if sugars started to elevate. We also discussed weight, diet and exercise related to HF and DM.   Plan:RNCM will resend correct email in [place] system and resend.

## 2014-05-21 ENCOUNTER — Encounter: Payer: Commercial Managed Care - HMO | Admitting: *Deleted

## 2014-05-21 ENCOUNTER — Other Ambulatory Visit: Payer: Self-pay | Admitting: Pharmacist

## 2014-05-21 DIAGNOSIS — D869 Sarcoidosis, unspecified: Secondary | ICD-10-CM

## 2014-05-21 NOTE — Progress Notes (Signed)
Daily Session Note  Patient Details  Name: Mackenzie Hill MRN: [id number] Date of Birth: 1940/07/08 Referring Provider:  Idelle Crouch, MD  Encounter Date: 05/21/2014  Check In:     Session Check In - 05/21/14 1125    Check-In   Staff Present Lestine Box BS, ACSM EP-C, Exercise Physiologist;Nuala Chiles Dillard Essex MS, ACSM CEP Exercise Physiologist;Laureen Janell Quiet, RRT, Respiratory Therapist   ER physicians immediately available to respond to emergencies [facility] immediately available ER MD   Physician(s) Schaevit and Jimmye Norman   Warm-up and Cool-down Performed on first and last piece of equipment   VAD Patient? No   Pain Assessment   Currently in Pain? No/denies   Multiple Pain Sites No         Goals Met:  Proper associated with RPD/PD & O2 Sat Independence with exercise equipment Exercise tolerated well  Goals Unmet:  Not Applicable  Goals Comments:    Dr. Emily Filbert is Medical Director for [place] and [facility].

## 2014-05-21 NOTE — Patient Outreach (Signed)
Called to follow up with patient about low blood sugars and medication dosage changes made to metformin, Januvia and amlodipine by primary care physician on 05/16/14. Left a HIPAA compliant message on the patient's voicemail. If have not heard from patient by 05/23/14, will give her another call at that time.  Harlow Asa, PharmD Clinical Pharmacist [place] Management [phone]

## 2014-05-23 ENCOUNTER — Encounter: Payer: Commercial Managed Care - HMO | Admitting: *Deleted

## 2014-05-23 ENCOUNTER — Other Ambulatory Visit: Payer: Self-pay | Admitting: *Deleted

## 2014-05-23 ENCOUNTER — Other Ambulatory Visit: Payer: Self-pay | Admitting: Pharmacist

## 2014-05-23 DIAGNOSIS — D869 Sarcoidosis, unspecified: Secondary | ICD-10-CM | POA: Diagnosis not present

## 2014-05-23 NOTE — Progress Notes (Signed)
Daily Session Note  Patient Details  Name: DEVONE BONILLA MRN: [id number] Date of Birth: 22-Jun-1940 Referring Provider:  Idelle Crouch, MD  Encounter Date: 05/23/2014  Check In:     Session Check In - 05/23/14 1144    Check-In   Staff Present Carson Myrtle BS, RRT, Respiratory Therapist;Renee Dillard [place] MS, ACSM CEP Exercise Physiologist;Steven Way BS, ACSM EP-C, Exercise Physiologist;Susanne Bice RN, BSN, [place]   ER physicians immediately available to respond to emergencies [facility] immediately available ER MD   Physician(s) Drs: Thomasene Lot and Joni Fears   Medication changes reported     No   Fall or balance concerns reported    No   Warm-up and Cool-down Performed on first and last piece of equipment   VAD Patient? No   Pain Assessment   Currently in Pain? No/denies           Exercise Prescription Changes - 05/23/14 1100    Treadmill   MPH 1.6   Grade 0      Goals Met:  Proper associated with RPD/PD & O2 Sat Independence with exercise equipment Using PLB without cueing & demonstrates good technique Exercise tolerated well Personal goals reviewed Strength training completed today  Goals Unmet:  Not Applicable  Goals Comments: Information on Sarcoidosis was given today from "Breathing in [place]"; information from Year of the Lung 2010; web sites of interest listed as well.   Dr. Emily Filbert is Medical Director for [place] and [facility].

## 2014-05-23 NOTE — Patient Outreach (Signed)
[place] [facility]) Care Management  05/23/2014  Mackenzie Hill [birth year] [id number]  Return phone call to patient to schedule home visit to complete Advanced Directive and to provide mental health referrals. HIPPA compliant voice mail message left for a return call.  [name] [facility] Care Management [phone]

## 2014-05-23 NOTE — Patient Outreach (Signed)
[facility]) Care Management  [place]   05/23/2014  Mackenzie Hill 03-14-1940 [id number]  Subjective:   Called to follow up with Mackenzie Hill about her blood sugar levels since last week when her PCP, Dr. Doy Hutching, reduced her dose of metformin and Januvia. Patient reports blood sugars in the 80s upon waking up and before bed. She reports a blood sugar level of 130 after "eating something that she wasn't supposed to". Patient reports that she is having a snack of crackers and peanut butter before bed to make sure that she does not have a low overnight. Reports that she has not had any lows since the doses of these medications were decreased.  Patient reports that her blood pressure is typically around 140-150/80, but has gotten up to 170s/80 when she has been upset. Spoke with patient about the reasons for blood pressure control. Asked patient if Dr. Doy Hutching has spoken to her about a specific goal for her blood pressure and if she knows why the provider decreased her dose of amlodipine. Patient reports that she has not had any swelling or low blood pressures and does not know why he decreased this dose.  Objective:   Current Medications: Current Outpatient Prescriptions  Medication Sig Dispense Refill  . ALBUTEROL SULFATE IN Inhale 2.5 mg into the lungs daily as needed.    Marland Kitchen amLODipine (NORVASC) 10 MG tablet Take 10 mg by mouth daily.    . budesonide-formoterol (SYMBICORT) 80-4.5 MCG/ACT inhaler Inhale 2 puffs into the lungs as needed.    . Calcium Carb-Cholecalciferol (CALCIUM + D3) 600-200 MG-UNIT TABS Take 600 mg by mouth 2 (two) times daily.    . carvedilol (COREG) 12.5 MG tablet Take 12.5 mg by mouth 2 (two) times daily with a meal.    . fexofenadine (ALLEGRA) 180 MG tablet Take 180 mg by mouth daily.    . furosemide (LASIX) 20 MG tablet Take 20 mg by mouth 2 (two) times daily.    Marland Kitchen glipiZIDE (GLUCOTROL XL) 10 MG 24 hr tablet Take 10 mg by mouth 2 (two) times  daily.    . isosorbide mononitrate (IMDUR) 60 MG 24 hr tablet Take 180 mg by mouth daily.    Marland Kitchen losartan (COZAAR) 100 MG tablet Take 100 mg by mouth daily.    . metFORMIN (GLUCOPHAGE) 1000 MG tablet Take 1,000 mg by mouth daily with breakfast.    . metFORMIN (GLUCOPHAGE) 1000 MG tablet Take 1,500 mg by mouth every evening.    . mometasone (NASONEX) 50 MCG/ACT nasal spray Place 2 sprays into the nose daily as needed.    . montelukast (SINGULAIR) 10 MG tablet Take 10 mg by mouth daily.    . sitaGLIPtin (JANUVIA) 100 MG tablet Take 100 mg by mouth daily.    Marland Kitchen spironolactone (ALDACTONE) 25 MG tablet Take 25 mg by mouth daily.    . vitamin B-12 (CYANOCOBALAMIN) 1000 MCG tablet Take 1,000 mcg by mouth daily.     No current facility-administered medications for this visit.    Functional Status: In your present state of health, do you have any difficulty performing the following activities: 05/01/2014  Hearing? N  Vision? N  Difficulty concentrating or making decisions? N  Walking or climbing stairs? N  Dressing or bathing? N  Doing errands, shopping? N  Preparing Food and eating ? N  Using the Toilet? N  In the past six months, have you accidently leaked urine? N  Do you have problems with loss of  bowel control? N  Managing your Medications? N  Managing your Finances? N  Housekeeping or managing your Housekeeping? N    Fall/Depression Screening: PHQ 2/9 Scores 05/07/2014 05/01/2014  PHQ - 2 Score 2 0  PHQ- 9 Score 5 -    Assessment:  Patient is adherent to medication changes from Dr. Doy Hutching. Her metformin and Januvia have now been renally dose adjusted.   Patient needs to discuss her blood pressure goal and reason for amlodipine dose reduction with Dr. Doy Hutching.  Plan:  Patient to call PCP's office today to ask about amlodipine dose reduction and blood pressure goal.   No further pharmacy issues at this time. Have confirmed that Mackenzie Hill has my contact information.  Harlow Asa, PharmD Clinical Pharmacist [facility] [phone]

## 2014-05-23 NOTE — Progress Notes (Signed)
Daily Session Note  Patient Details  Name: Mackenzie Hill MRN: [id number] Date of Birth: 06/17/1940 Referring Provider:  Sparks, Jeffrey D, MD  Encounter Date: 05/23/2014  Check In:     Session Check In - 05/23/14 1144    Check-In   Staff Present Laureen Brown BS, RRT, Respiratory Therapist;Renee MacMillan MS, ACSM CEP Exercise Physiologist;Steven Way BS, ACSM EP-C, Exercise Physiologist;Susanne Bice RN, BSN, CCRP   ER physicians immediately available to respond to emergencies [facility] immediately available ER MD   Physician(s) Drs: Kaminski and Stafford   Medication changes reported     No   Fall or balance concerns reported    No   Warm-up and Cool-down Performed on first and last piece of equipment   VAD Patient? No   Pain Assessment   Currently in Pain? No/denies           Exercise Prescription Changes - 05/23/14 1100    Treadmill   MPH 1.6   Grade 0      Goals Met:  Independence with exercise equipment Exercise tolerated well Personal goals reviewed Strength training completed today  Goals Unmet:  Not Applicable  Goals Comments:    Dr. Mark Miller is Medical Director for [facility] and [facility]. 

## 2014-05-28 ENCOUNTER — Encounter: Payer: Commercial Managed Care - HMO | Admitting: *Deleted

## 2014-05-28 DIAGNOSIS — D869 Sarcoidosis, unspecified: Secondary | ICD-10-CM | POA: Diagnosis not present

## 2014-05-28 NOTE — Progress Notes (Signed)
Daily Session Note  Patient Details  Name: Mackenzie Hill MRN: [id number] Date of Birth: [birth year] Referring Provider:  Idelle Crouch, MD  Encounter Date: 05/28/2014  Check In:     Session Check In - 05/28/14 1043    Check-In   Staff Present Carson Myrtle BS, RRT, Respiratory Therapist;Steven Way BS, ACSM EP-C, Exercise Physiologist;Renee Dillard Essex MS, ACSM CEP Exercise Physiologist   ER physicians immediately available to respond to emergencies [facility] immediately available ER MD   Physician(s) Karma Greaser and Corky Downs   Medication changes reported     No   Fall or balance concerns reported    No   Warm-up and Cool-down Performed on first and last piece of equipment   VAD Patient? No   Pain Assessment   Currently in Pain? No/denies           Exercise Prescription Changes - 05/28/14 1000    NuStep   Level 4   Watts 55   REL-XR   Level 4   Watts 55      Goals Met:  Proper associated with RPD/PD & O2 Sat Independence with exercise equipment Exercise tolerated well Personal goals reviewed Strength training completed today  Goals Unmet:  Not Applicable  Goals Comments:    Dr. Emily Filbert is Medical Director for [place] and [facility].

## 2014-05-28 NOTE — Progress Notes (Signed)
Daily Session Note  Patient Details  Name: Mackenzie Hill MRN: [id number] Date of Birth: 1941-01-06 Referring Provider:  Idelle Crouch, MD  Encounter Date: 05/28/2014  Check In:     Session Check In - 05/28/14 1043    Check-In   Staff Present Carson Myrtle BS, RRT, Respiratory Therapist;Steven Way BS, ACSM EP-C, Exercise Physiologist;Renee Dillard Essex MS, ACSM CEP Exercise Physiologist   ER physicians immediately available to respond to emergencies [facility] immediately available ER MD   Physician(s) Karma Greaser and Corky Downs   Medication changes reported     No   Fall or balance concerns reported    No   Warm-up and Cool-down Performed on first and last piece of equipment   VAD Patient? No   Pain Assessment   Currently in Pain? No/denies           Exercise Prescription Changes - 05/28/14 1000    NuStep   Level --   Watts --   REL-XR   Level --   Watts --      Goals Met:  Proper associated with RPD/PD & O2 Sat Independence with exercise equipment Exercise tolerated well Strength training completed today  Goals Unmet:  Not Applicable  Goals Comments:    Dr. Emily Filbert is Medical Director for [place] and [facility].

## 2014-05-29 ENCOUNTER — Other Ambulatory Visit: Payer: Self-pay | Admitting: *Deleted

## 2014-05-29 ENCOUNTER — Encounter: Payer: Self-pay | Admitting: *Deleted

## 2014-05-29 DIAGNOSIS — G4733 Obstructive sleep apnea (adult) (pediatric): Secondary | ICD-10-CM | POA: Diagnosis not present

## 2014-05-29 DIAGNOSIS — D869 Sarcoidosis, unspecified: Secondary | ICD-10-CM | POA: Diagnosis not present

## 2014-05-29 DIAGNOSIS — I519 Heart disease, unspecified: Secondary | ICD-10-CM | POA: Diagnosis not present

## 2014-05-29 NOTE — Patient Outreach (Signed)
[facility]) Care Management   05/29/2014  PATRICE MATTHEW 1940-02-12 [id number]  Mackenzie Hill is an 74 y.o. female  Subjective: "My doctor's office called me to tell me my lab values and told me I need to drink a lot more water. Since she told me that I've noticed my weight has been going up and I know that is not good." " I would really like to get off dome of this weight, but I'm having a hard time losing the weight even though I really don't eat much.'   Objective:  Blood pressure 120/72, pulse 62, resp. rate 18, height 1.651 m ('5\' 5"' ), weight 225 lb (102.059 kg), SpO2 96 % on room air. Review of Systems  All other systems reviewed and are negative.   Physical Exam  Constitutional: She is oriented to person, place, and time. Vital signs are normal. She appears well-developed and well-nourished.  Cardiovascular: Regular rhythm.  Bradycardia present.   Pt recording b/p and hr daily. Daily hr in the 50's  Respiratory: Effort normal and breath sounds normal.  GI: Soft. Bowel sounds are normal.  Neurological: She is alert and oriented to person, place, and time.  Skin: Skin is warm and dry.  Bilateral foot exam completed per diabetic protocol, bilateral feet with skin intact and no edema noted.   Psychiatric: She has a normal mood and affect. Her speech is normal and behavior is normal. Judgment and thought content normal. Cognition and memory are normal.    Current Medications:   Current Outpatient Prescriptions  Medication Sig Dispense Refill  . ALBUTEROL SULFATE IN Inhale 2.5 mg into the lungs daily as needed.    Marland Kitchen amLODipine (NORVASC) 10 MG tablet Take 5 mg by mouth daily.     . budesonide-formoterol (SYMBICORT) 80-4.5 MCG/ACT inhaler Inhale 2 puffs into the lungs as needed.    . Calcium Carb-Cholecalciferol (CALCIUM + D3) 600-200 MG-UNIT TABS Take 600 mg by mouth 2 (two) times daily.    . carvedilol (COREG) 12.5 MG tablet Take 12.5 mg by mouth 2 (two) times  daily with a meal.    . fexofenadine (ALLEGRA) 180 MG tablet Take 180 mg by mouth daily.    . furosemide (LASIX) 20 MG tablet Take 20 mg by mouth 2 (two) times daily.    Marland Kitchen glipiZIDE (GLUCOTROL XL) 10 MG 24 hr tablet Take 10 mg by mouth 2 (two) times daily.    . isosorbide mononitrate (IMDUR) 60 MG 24 hr tablet Take 180 mg by mouth daily.    Marland Kitchen losartan (COZAAR) 100 MG tablet Take 100 mg by mouth daily.    . metFORMIN (GLUCOPHAGE) 1000 MG tablet Take 500 mg by mouth 2 (two) times daily with a meal.     . mometasone (NASONEX) 50 MCG/ACT nasal spray Place 2 sprays into the nose daily as needed.    . montelukast (SINGULAIR) 10 MG tablet Take 10 mg by mouth daily.    . sitaGLIPtin (JANUVIA) 100 MG tablet Take 50 mg by mouth daily.     Marland Kitchen spironolactone (ALDACTONE) 25 MG tablet Take 25 mg by mouth daily.    . vitamin B-12 (CYANOCOBALAMIN) 1000 MCG tablet Take 1,000 mcg by mouth daily.    . metFORMIN (GLUCOPHAGE) 1000 MG tablet Take 1,500 mg by mouth every evening.     No current facility-administered medications for this visit.    Functional Status:   In your present state of health, do you have any difficulty performing the following  activities: 05/01/2014  Hearing? N  Vision? N  Difficulty concentrating or making decisions? N  Walking or climbing stairs? N  Dressing or bathing? N  Doing errands, shopping? N  Preparing Food and eating ? N  Using the Toilet? N  In the past six months, have you accidently leaked urine? N  Do you have problems with loss of bowel control? N  Managing your Medications? N  Managing your Finances? N  Housekeeping or managing your Housekeeping? N    Fall/Depression Screening:    PHQ 2/9 Scores 05/07/2014 05/01/2014  PHQ - 2 Score 2 0  PHQ- 9 Score 5 -   THN CM Care Plan Problem One        Patient Outreach from 05/29/2014 in [place] Problem One  Knowlege deficit of HF as evidenced by pt stating "I need to know more about heart  faillure."   Care Plan for Problem One  Active   THN Long Term Goal (31-90 days)  Pt will not be readmitted to the hospital related to signs and symptoms of heart failure in the next 90 days   THN Long Term Goal Start Date  05/01/14   Interventions for Problem One Long Term Goal  Pt given heart failure packet, RNCM and pt discussed the HF danger zones and when to notify the MD.   [facility] CM Short Term Goal #1 (0-30 days)  Pt will be able to verbalize HF zones without looking at the education sheet, allowing her to recognize s/s of HF while away from home by RNCM's next visit in 30 days   THN CM Short Term Goal #1 Start Date  05/01/14   [facility] CM Short Term Goal #1 Met Date  05/29/14   Interventions for Short Term Goal #1  Heart failure zone education sheet given. RNCM and pt discussed zones and their meanings. Pt verbalized understanding and motivation to want to be very familiar with s/s of  HF   [facility]N CM Short Term Goal #2 (0-30 days)  Pt will regain the strength to work in her yard, starting on Tuesdays and Thursdays, off days of pulmonary rehab in the next 30 days.   THN CM Short Term Goal #2 Start Date  05/01/14   [facility] CM Short Term Goal #2 Met Date  05/29/14   Interventions for Short Term Goal #2  RNCM and pt discussed pt's love of working in her yard and how to safely exercise, recognizing the s/s of needing to rest. Pt given EMMI education on HF which addressed HF and safe exercise.   [facility]N CM Short Term Goal #3 (0-30 days)  Pt will lose 5lbs in the next 30days to increase energy and work towards lowering pill burden.    THN CM Short Term Goal #3 Start Date  05/29/14   Interventions for Short Tern Goal #3  Nutrition education discussed. RNCM will investigate and inform pt of safe ways to lose weight with diabetes.    THN CM Short Term Goal #4 (0-30 days)  Pt will be clear on how much fluid/water she should be drinking per day and stay with in the limits within the next 30 days.   THN CM Short Term Goal  #4 Start Date  05/29/14   Interventions for Short Term Goal #4  RNCM placed phone call  to primary care physician to obtain optimal daily po fluid intake amount. Pt will continue to weigh daily and log  in [facility] calendar  to monitor for fluid overload.       Assessment:  Alert and oriented pleasant pt. Noted to be weighing daily, taking blood pressure daily and checking sugars as directed and recording all values in [facility] calendar. Vital signs stable today. Pt reports periods of fatigue in which she has to rest. She was familiar with the hf zones and when to call the MD. She has tolerated recent med changes with no adverse reactions thus far. Pt was feeling well today and had just returned from visiting her COPD specialist. She reported he made no changes and was pleased with her current status. Pt desiring to lose weight and be on the right amount of fluid per day. Pt given a THN med organizing pill box per pt request. Pt also given advanced directive paper work to look over before SW visits later this week. Pt noted to be doing well and RNCM foresees being able to discharge pt to [facility] disease management within the next 60 days.   Plan:  RNCM will obtain advice from diabetic educator on healthy ways to lose weight as a diabetic with heart failure and COPD and inform pt.  RNCM will call pt's primary care MD again to obtain the specific po fluid intake he recommends.  RNCM will see pt in one month to discuss discharge from community care management.   Rutherford Limerick RN, BSN  [facility] Care Management [phone])

## 2014-05-30 DIAGNOSIS — D869 Sarcoidosis, unspecified: Secondary | ICD-10-CM | POA: Diagnosis not present

## 2014-05-30 NOTE — Progress Notes (Signed)
Daily Session Note  Patient Details  Name: Mackenzie Hill MRN: [id number] Date of Birth: [birth year] Referring Provider:  Idelle Crouch, MD  Encounter Date: 05/30/2014  Check In:     Session Check In - 05/30/14 1033    Check-In   Staff Present Laureen Owens Shark BS, RRT, Respiratory Therapist;Renee Dillard Essex MS, ACSM CEP Exercise Physiologist;Mansfield Dann BS, ACSM EP-C, Exercise Physiologist   ER physicians immediately available to respond to emergencies [facility] immediately available ER MD   Physician(s) gayle and williams   Medication changes reported     No   Fall or balance concerns reported    No   Warm-up and Cool-down Performed on first and last piece of equipment   VAD Patient? No   Pain Assessment   Currently in Pain? No/denies         Goals Met:  Proper associated with RPD/PD & O2 Sat Exercise tolerated well No report of cardiac concerns or symptoms Strength training completed today  Goals Unmet:  Not Applicable  Goals Comments:    Dr. Emily Filbert is Medical Director for [place] and [facility].

## 2014-05-31 ENCOUNTER — Ambulatory Visit: Payer: Commercial Managed Care - HMO | Admitting: *Deleted

## 2014-06-04 ENCOUNTER — Encounter: Payer: Commercial Managed Care - HMO | Admitting: *Deleted

## 2014-06-04 ENCOUNTER — Encounter: Payer: Self-pay | Admitting: *Deleted

## 2014-06-04 DIAGNOSIS — D869 Sarcoidosis, unspecified: Secondary | ICD-10-CM

## 2014-06-04 NOTE — Progress Notes (Signed)
Daily Session Note  Patient Details  Name: Mackenzie Hill MRN: [id number] Date of Birth: 1940/10/25 Referring Provider:  Idelle Crouch, MD  Encounter Date: 06/04/2014  Check In:     Session Check In - 06/04/14 1404    Check-In   Staff Present Frederich Balding MPH, CHES;Laureen Janell Quiet, RRT, Respiratory Therapist;Kennedy Brines Alfonso Patten, ACSM CEP Exercise Physiologist;Renee Dillard Essex MS, ACSM CEP Exercise Physiologist   ER physicians immediately available to respond to emergencies [facility] immediately available ER MD   Physician(s) Corky Downs and Lord   Medication changes reported     No   Fall or balance concerns reported    No   Warm-up and Cool-down Performed on first and last piece of equipment   VAD Patient? No   Pain Assessment   Multiple Pain Sites No           Exercise Prescription Changes - 06/04/14 1400    Exercise Review   Progression Yes   Response to Exercise   Blood Pressure (Admit) 120/78 mmHg   Blood Pressure (Exercise) 140/72 mmHg   Blood Pressure (Exit) 114/60 mmHg   Heart Rate (Admit) 63 bpm   Heart Rate (Exercise) 87 bpm   Heart Rate (Exit) 60 bpm   Oxygen Saturation (Admit) 100 %   Oxygen Saturation (Exercise) 99 %   Oxygen Saturation (Exit) 100 %   Rating of Perceived Exertion (Exercise) 11.5   Perceived Dyspnea (Exercise) 3   Resistance Training   Training Prescription Yes   Weight 2   Reps 10-12   Treadmill   MPH 1.6   Grade 1   Minutes 15   Recumbant Bike   Level 4   RPM 50   Minutes 15   NuStep   Level 5   Watts 65   Minutes 15      Goals Met:  Proper associated with RPD/PD & O2 Sat Independence with exercise equipment Exercise tolerated well Strength training completed today  Goals Unmet:  Not Applicable  Goals Comments:    Dr. Emily Filbert is Medical Director for [place] and [facility].

## 2014-06-04 NOTE — Patient Outreach (Signed)
[facility]) Care Management  06/04/2014  KATHRY HILGEMAN [birth year] [id number]   Pt sent an e-mail answering questions related to her fluid intake. Pt. also emailed  information on how to contact the lifestyle center for weight loss, while being diabetic.   Plan: RNCM will follow-up with pt next week to confirm she was able to get in touch with [facility] and she understands provided education.  Rutherford Limerick RN, BSN  [facility] Care Management [phone])

## 2014-06-04 NOTE — Progress Notes (Signed)
Pulmonary Individual Treatment Plan  Patient Details  Name: Mackenzie Hill MRN: [id number] Date of Birth: [birth year] Referring Provider:  Idelle Crouch, MD  Initial Encounter Date:    Visit Diagnosis: Sarcoidosis  Patient's Home Medications on Admission:  Current outpatient prescriptions:  .  ALBUTEROL SULFATE IN, Inhale 2.5 mg into the lungs daily as needed., Disp: , Rfl:  .  amLODipine (NORVASC) 10 MG tablet, Take 5 mg by mouth daily. , Disp: , Rfl:  .  budesonide-formoterol (SYMBICORT) 80-4.5 MCG/ACT inhaler, Inhale 2 puffs into the lungs as needed., Disp: , Rfl:  .  Calcium Carb-Cholecalciferol (CALCIUM + D3) 600-200 MG-UNIT TABS, Take 600 mg by mouth 2 (two) times daily., Disp: , Rfl:  .  carvedilol (COREG) 12.5 MG tablet, Take 12.5 mg by mouth 2 (two) times daily with a meal., Disp: , Rfl:  .  fexofenadine (ALLEGRA) 180 MG tablet, Take 180 mg by mouth daily., Disp: , Rfl:  .  furosemide (LASIX) 20 MG tablet, Take 20 mg by mouth 2 (two) times daily., Disp: , Rfl:  .  glipiZIDE (GLUCOTROL XL) 10 MG 24 hr tablet, Take 10 mg by mouth 2 (two) times daily., Disp: , Rfl:  .  isosorbide mononitrate (IMDUR) 60 MG 24 hr tablet, Take 180 mg by mouth daily., Disp: , Rfl:  .  losartan (COZAAR) 100 MG tablet, Take 100 mg by mouth daily., Disp: , Rfl:  .  metFORMIN (GLUCOPHAGE) 1000 MG tablet, Take 500 mg by mouth 2 (two) times daily with a meal. , Disp: , Rfl:  .  metFORMIN (GLUCOPHAGE) 1000 MG tablet, Take 1,500 mg by mouth every evening., Disp: , Rfl:  .  mometasone (NASONEX) 50 MCG/ACT nasal spray, Place 2 sprays into the nose daily as needed., Disp: , Rfl:  .  montelukast (SINGULAIR) 10 MG tablet, Take 10 mg by mouth daily., Disp: , Rfl:  .  sitaGLIPtin (JANUVIA) 100 MG tablet, Take 50 mg by mouth daily. , Disp: , Rfl:  .  spironolactone (ALDACTONE) 25 MG tablet, Take 25 mg by mouth daily., Disp: , Rfl:  .  vitamin B-12 (CYANOCOBALAMIN) 1000 MCG tablet, Take 1,000 mcg by mouth daily.,  Disp: , Rfl:   Past Medical History: Past Medical History  Diagnosis Date  . Cataract   . CHF (congestive heart failure)   . COPD (chronic obstructive pulmonary disease)   . Diabetes mellitus without complication   . Sarcoidosis, lung     reported by pt    Tobacco Use: History  Smoking status  . Former Smoker -- 0.50 packs/day for 3 years  . Types: Cigarettes  . Start date: 01/12/1962  . Quit date: 01/12/1965  Smokeless tobacco  . Not on file    Labs: Recent Review Flowsheet Data    There is no flowsheet data to display.       ADL UCSD:    Pulmonary Function Assessment:   Exercise Target Goals:    Exercise Program Goal: Individual exercise prescription set with THRR, safety & activity barriers. Participant demonstrates ability to understand and report RPE using BORG scale, to self-measure pulse accurately, and to acknowledge the importance of the exercise prescription.  Exercise Prescription Goal: Starting with aerobic activity 30 plus minutes a day, 3 days per week for initial exercise prescription. Provide home exercise prescription and guidelines that participant acknowledges understanding prior to discharge.  Activity Barriers & Risk Stratification:   6 Minute Walk:   Initial Exercise Prescription:   Exercise Prescription Changes:  Exercise Prescription Changes      05/16/14 1100 05/23/14 1100 05/28/14 1000 06/04/14 1400     Exercise Review   Progression Yes   Yes    Response to Exercise   Blood Pressure (Admit)    120/78 mmHg    Blood Pressure (Exercise)    140/72 mmHg    Blood Pressure (Exit)    114/60 mmHg    Heart Rate (Admit)    63 bpm    Heart Rate (Exercise)    87 bpm    Heart Rate (Exit)    60 bpm    Oxygen Saturation (Admit)    100 %    Oxygen Saturation (Exercise)    99 %    Oxygen Saturation (Exit)    100 %    Rating of Perceived Exertion (Exercise)    11.5    Perceived Dyspnea (Exercise)    3    Resistance Training    Training Prescription    Yes    Weight    2    Reps    10-12    Treadmill   MPH  1.6  1.6    Grade  0  1    Minutes    15    Recumbant Bike   Level    4    RPM    50    Minutes    15    NuStep   Level 5  -- 5    Watts 55  -- 65    Minutes    15    REL-XR   Level   --     Watts   --        Discharge Exercise Prescription:    Nutrition:  Target Goals: Understanding of nutrition guidelines, daily intake of sodium 1500mg , cholesterol 200mg , calories 30% from fat and 7% or less from saturated fats, daily to have 5 or more servings of fruits and vegetables.  Biometrics:    Nutrition Therapy Plan and Nutrition Goals:   Nutrition Discharge: Rate Your Plate Scores:   Psychosocial: Target Goals: Acknowledge presence or absence of depression, maximize coping skills, provide positive support system. Participant is able to verbalize types and ability to use techniques and skills needed for reducing stress and depression.  Initial Review & Psychosocial Screening:   Quality of Life Scores:   PHQ-9:     Recent Review Flowsheet Data    Depression screen [facility] 2/9 05/07/2014 05/01/2014   Decreased Interest 1 0   Down, Depressed, Hopeless 1 0   PHQ - 2 Score 2 0   Altered sleeping 1 -   Tired, decreased energy 1 -   Change in appetite 1 -   Feeling bad or failure about yourself  0 -   Trouble concentrating 0 -   Moving slowly or fidgety/restless 0 -   Suicidal thoughts 0 -   PHQ-9 Score 5 -   Difficult doing work/chores Not difficult at all -      Psychosocial Evaluation and Intervention:   Psychosocial Re-Evaluation:  Education: Education Goals: Education classes will be provided on a weekly basis, covering required topics. Participant will state understanding/return demonstration of topics presented.  Learning Barriers/Preferences:   Education Topics: Initial Evaluation Education: - Verbal, written and demonstration of respiratory meds, RPE/PD scales,  oximetry and breathing techniques. Instruction on use of nebulizers and MDIs: cleaning and proper use, rinsing mouth with steroid doses and importance of monitoring MDI activations.   General Nutrition Guidelines/Fats  and Fiber: -Group instruction provided by verbal, written material, models and posters to present the general guidelines for heart healthy nutrition. Gives an explanation and review of dietary fats and fiber.   Controlling Sodium/Reading Food Labels: -Group verbal and written material supporting the discussion of sodium use in heart healthy nutrition. Review and explanation with models, verbal and written materials for utilization of the food label.   Exercise Physiology & Risk Factors: - Group verbal and written instruction with models to review the exercise physiology of the cardiovascular system and associated critical values. Details cardiovascular disease risk factors and the goals associated with each risk factor.   Aerobic Exercise & Resistance Training: - Gives group verbal and written discussion on the health impact of inactivity. On the components of aerobic and resistive training programs and the benefits of this training and how to safely progress through these programs.   Flexibility, Balance, General Exercise Guidelines: - Provides group verbal and written instruction on the benefits of flexibility and balance training programs. Provides general exercise guidelines with specific guidelines to those with heart or lung disease. Demonstration and skill practice provided.   Stress Management: - Provides group verbal and written instruction about the health risks of elevated stress, cause of high stress, and healthy ways to reduce stress.   Depression: - Provides group verbal and written instruction on the correlation between heart/lung disease and depressed mood, treatment options, and the stigmas associated with seeking treatment.   Exercise & Equipment  Safety: - Individual verbal instruction and demonstration of equipment use and safety with use of the equipment.   Infection Prevention: - Provides verbal and written material to individual with discussion of infection control including proper hand washing and proper equipment cleaning during exercise session.   Falls Prevention: - Provides verbal and written material to individual with discussion of falls prevention and safety.   Diabetes: - Individual verbal and written instruction to review signs/symptoms of diabetes, desired ranges of glucose level fasting, after meals and with exercise. Advice that pre and post exercise glucose checks will be done for 3 sessions at entry of program.   Chronic Lung Diseases: - Group verbal and written instruction to review new updates, new respiratory medications, new advancements in procedures and treatments. Provide informative websites and "800" numbers of self-education.   Lung Procedures: - Group verbal and written instruction to describe testing methods done to diagnose lung disease. Review the outcome of test results. Describe the treatment choices: Pulmonary Function Tests, ABGs and oximetry.   Energy Conservation: - Provide group verbal and written instruction for methods to conserve energy, plan and organize activities. Instruct on pacing techniques, use of adaptive equipment and posture/positioning to relieve shortness of breath.   Triggers: - Group verbal and written instruction to review types of environmental controls: home humidity, furnaces, filters, dust mite/pet prevention, HEPA vacuums. To discuss weather changes, air quality and the benefits of nasal washing.   Exacerbations: - Group verbal and written instruction to provide: warning signs, infection symptoms, calling MD promptly, preventive modes, and value of vaccinations. Review: effective airway clearance, coughing and/or vibration techniques. Create an Advertising copywriter.   Oxygen: - Individual and group verbal and written instruction on oxygen therapy. Includes supplement oxygen, available portable oxygen systems, continuous and intermittent flow rates, oxygen safety, concentrators, and Medicare reimbursement for oxygen.   Respiratory Medications: - Group verbal and written instruction to review medications for lung disease. Drug class, frequency, complications, importance of spacers, rinsing mouth after steroid MDI's, and  proper cleaning methods for nebulizers.   AED/CPR: - Group verbal and written instruction with the use of models to demonstrate the basic use of the AED with the basic ABC's of resuscitation.   Breathing Retraining: - Provides individuals verbal and written instruction on purpose, frequency, and proper technique of diaphragmatic breathing and pursed-lipped breathing. Applies individual practice skills.   Anatomy and Physiology of the Lungs: - Group verbal and written instruction with the use of models to provide basic lung anatomy and physiology related to function, structure and complications of lung disease.   Heart Failure: - Group verbal and written instruction on the basics of heart failure: signs/symptoms, treatments, explanation of ejection fraction, enlarged heart and cardiomyopathy.   Sleep Apnea: - Individual verbal and written instruction to review Obstructive Sleep Apnea. Review of risk factors, methods for diagnosing and types of masks and machines for OSA.   Anxiety: - Provides group, verbal and written instruction on the correlation between heart/lung disease and anxiety, treatment options, and management of anxiety.   Relaxation: - Provides group, verbal and written instruction about the benefits of relaxation for patients with heart/lung disease. Also provides patients with examples of relaxation techniques.   Knowledge Questionnaire Score:   Personal Goals and Risk Factors at Admission:      Personal Goals and Risk Factors at Admission - 03/27/14 0900    Personal Goals and Risk Factors on Admission   Increase Aerobic Exercise and Physical Activity Yes   Intervention While in program, learn and follow the exercise prescription taught. Start at a low level workload and increase workload after able to maintain previous level for 30 minutes. Increase time before increasing intensity.   Understand more about Heart/Pulmonary Disease. Yes   Intervention While in program utilize professionals for any questions, and attend the education sessions. Great websites to use are www.americanheart.org or www.lung.org for reliable information.   Improve shortness of breath with ADL's Yes   Intervention While in program, learn and follow the exercise prescription taught. Start at a low level workload and increase workload ad advised by the exercise physiologist. Increase time before increasing intensity.   Develop more efficient breathing techniques such as purse lipped breathing and diaphragmatic breathing; and practicing self-pacing with activity Yes   Intervention While in program, learn and utilize the specific breathing techniques taught to you. Continue to practice and use the techniques as needed.   Increase knowledge of respiratory medications and ability to use respiratory devices properly.  Yes   Intervention While in program, learn to administer MDI, nebulizer, and spacer properly.;Learn to take respiratory medicine as ordered.;While in program, learn to Clean MDI, nebulizers, and spacers properly.   Hypertension Yes   Goal Participant will see blood pressure controlled within the values of 140/10mm/Hg or within value directed by their physician.   Intervention Provide nutrition & aerobic exercise along with prescribed medications to achieve BP 140/90 or less.      Personal Goals and Risk Factors Review:      Goals and Risk Factor Review      05/30/14 1536           Increase Aerobic  Exercise and Physical Activity   Goals Progress/Improvement seen  Yes       Comments Keydi was increasing to Level 5 on the  NS, however this level proved too difficult for her to maintain a high enough speed for her to get an aerobic workout and related benefits. Instead of increasing her level, we are having  her focus on keeping her Francena Hanly  level  high in order for her to get a quality aerobic workout. She will focus on keeping her watts above 40.           Personal Goals Discharge:      Personal Goals at Discharge - 05/28/14 1000    Increase Aerobic Exercise and Physical Activity   Goals Progress/Improvement seen  Yes   Comments Ms Depaoli washed her trim on he house the other day - she is noticing increased stamina and applied pacing to her activity.      Comments: 30 Day Review

## 2014-06-06 ENCOUNTER — Other Ambulatory Visit: Payer: Self-pay | Admitting: *Deleted

## 2014-06-06 DIAGNOSIS — D869 Sarcoidosis, unspecified: Secondary | ICD-10-CM

## 2014-06-06 NOTE — Progress Notes (Unsigned)
Daily Session Note  Patient Details  Name: Mackenzie Hill MRN: [id number] Date of Birth: 05/06/1940 Referring Provider:  Idelle Crouch, MD  Encounter Date: 06/06/2014  Check In:     Session Check In - 06/06/14 1031    Check-In   Staff Present Candiss Norse MS, ACSM CEP Exercise Physiologist;E[place] Marchiano BS, ACSM EP-C, Exercise Physiologist;Carroll Enterkin RN, BSN   ER physicians immediately available to respond to emergencies [facility] immediately available ER MD   Medication changes reported     No   Fall or balance concerns reported    No   Warm-up and Cool-down Performed on first and last piece of equipment   VAD Patient? No   Pain Assessment   Currently in Pain? No/denies         Goals Met:  Proper associated with RPD/PD & O2 Sat Exercise tolerated well No report of cardiac concerns or symptoms Strength training completed today  Goals Unmet:  Not Applicable  Goals Comments:    Dr. Emily Filbert is Medical Director for [place] and [facility].

## 2014-06-06 NOTE — Patient Outreach (Signed)
[place] [facility]) Care Management  06/06/2014  Mackenzie Hill 1940/11/20 [id number]    Phone call to patient to reschedule home visit that she previously cancelled. Voicemail message left for a return call.  [name] [facility] Care Management [phone]

## 2014-06-12 ENCOUNTER — Other Ambulatory Visit: Payer: Self-pay | Admitting: *Deleted

## 2014-06-12 NOTE — Patient Outreach (Signed)
[place] [facility]) Care Management  06/12/2014  Mackenzie Hill 14-Jan-1940 [id number]   Home visit scheduled with patient for 06/18/14.  To meet following her appointment at [facility]-physical therapy department to complete advanced directive. Patient would rather meet after her appointment to ensure privacy.   [name] [facility] Care Management [phone]

## 2014-06-13 ENCOUNTER — Encounter: Payer: Commercial Managed Care - HMO | Attending: Internal Medicine

## 2014-06-13 DIAGNOSIS — D869 Sarcoidosis, unspecified: Secondary | ICD-10-CM

## 2014-06-13 NOTE — Progress Notes (Signed)
Daily Session Note  Patient Details  Name: LABRESHA MELLOR MRN: [id number] Date of Birth: 1940/07/23 Referring Provider:  Idelle Crouch, MD  Encounter Date: 06/13/2014  Check In:     Session Check In - 06/13/14 1051    Check-In   Staff Present Lestine Box BS, ACSM EP-C, Exercise Physiologist;Renee Dillard Essex MS, ACSM CEP Exercise Physiologist;Stacey Blanch Media RRT, RCP Respiratory Therapist   ER physicians immediately available to respond to emergencies [facility] immediately available ER MD   Physician(s) Reita Cliche and Jimmye Norman   Medication changes reported     No   Fall or balance concerns reported    No   Warm-up and Cool-down Performed on first and last piece of equipment   VAD Patient? No   Pain Assessment   Currently in Pain? No/denies         Goals Met:  Proper associated with RPD/PD & O2 Sat Exercise tolerated well No report of cardiac concerns or symptoms Strength training completed today  Goals Unmet:  Not Applicable  Goals Comments:    Dr. Emily Filbert is Medical Director for [place] and [facility].

## 2014-06-14 ENCOUNTER — Ambulatory Visit: Payer: Commercial Managed Care - HMO | Attending: Specialist

## 2014-06-14 DIAGNOSIS — G4733 Obstructive sleep apnea (adult) (pediatric): Secondary | ICD-10-CM | POA: Insufficient documentation

## 2014-06-18 ENCOUNTER — Encounter: Payer: Commercial Managed Care - HMO | Admitting: *Deleted

## 2014-06-18 ENCOUNTER — Other Ambulatory Visit: Payer: Self-pay | Admitting: *Deleted

## 2014-06-18 DIAGNOSIS — D869 Sarcoidosis, unspecified: Secondary | ICD-10-CM

## 2014-06-18 NOTE — Progress Notes (Signed)
Daily Session Note  Patient Details  Name: Mackenzie Hill MRN: [id number] Date of Birth: 1940-10-15 Referring Provider:  Idelle Crouch, MD  Encounter Date: 06/18/2014  Check In:     Session Check In - 06/18/14 1114    Check-In   Staff Present Carson Myrtle BS, RRT, Respiratory Therapist;Renee Dillard Essex MS, ACSM CEP Exercise Physiologist;Jeanene Mena Alfonso Patten, ACSM CEP Exercise Physiologist;Steven Way BS, ACSM EP-C, Exercise Physiologist   ER physicians immediately available to respond to emergencies [facility] immediately available ER MD   Physician(s) Jacqualine Code and [place]   Medication changes reported     No   Fall or balance concerns reported    No   Warm-up and Cool-down Performed on first and last piece of equipment   VAD Patient? No   Pain Assessment   Currently in Pain? No/denies   Multiple Pain Sites No         Goals Met:  Proper associated with RPD/PD & O2 Sat Independence with exercise equipment Exercise tolerated well Strength training completed today  Goals Unmet:  Not Applicable  Goals Comments:    Dr. Emily Filbert is Medical Director for [place] and [facility].

## 2014-06-18 NOTE — Patient Outreach (Signed)
  [facility]) Care Management  [facility] Social Work  06/18/2014  Mackenzie Hill 1940-04-23 [id number]    Current Medications:  Current Outpatient Prescriptions  Medication Sig Dispense Refill  . ALBUTEROL SULFATE IN Inhale 2.5 mg into the lungs daily as needed.    Marland Kitchen amLODipine (NORVASC) 10 MG tablet Take 5 mg by mouth daily.     . budesonide-formoterol (SYMBICORT) 80-4.5 MCG/ACT inhaler Inhale 2 puffs into the lungs as needed.    . Calcium Carb-Cholecalciferol (CALCIUM + D3) 600-200 MG-UNIT TABS Take 600 mg by mouth 2 (two) times daily.    . carvedilol (COREG) 12.5 MG tablet Take 12.5 mg by mouth 2 (two) times daily with a meal.    . fexofenadine (ALLEGRA) 180 MG tablet Take 180 mg by mouth daily.    . furosemide (LASIX) 20 MG tablet Take 20 mg by mouth 2 (two) times daily.    Marland Kitchen glipiZIDE (GLUCOTROL XL) 10 MG 24 hr tablet Take 10 mg by mouth 2 (two) times daily.    . isosorbide mononitrate (IMDUR) 60 MG 24 hr tablet Take 180 mg by mouth daily.    Marland Kitchen losartan (COZAAR) 100 MG tablet Take 100 mg by mouth daily.    . metFORMIN (GLUCOPHAGE) 1000 MG tablet Take 500 mg by mouth 2 (two) times daily with a meal.     . metFORMIN (GLUCOPHAGE) 1000 MG tablet Take 1,500 mg by mouth every evening.    . mometasone (NASONEX) 50 MCG/ACT nasal spray Place 2 sprays into the nose daily as needed.    . montelukast (SINGULAIR) 10 MG tablet Take 10 mg by mouth daily.    . sitaGLIPtin (JANUVIA) 100 MG tablet Take 50 mg by mouth daily.     Marland Kitchen spironolactone (ALDACTONE) 25 MG tablet Take 25 mg by mouth daily.    . vitamin B-12 (CYANOCOBALAMIN) 1000 MCG tablet Take 1,000 mcg by mouth daily.     No current facility-administered medications for this visit.    Functional Status:  In your present state of health, do you have any difficulty performing the following activities: 05/01/2014  Hearing? N  Vision? N  Difficulty concentrating or making decisions? N  Walking or climbing stairs? N  Dressing  or bathing? N  Doing errands, shopping? N  Preparing Food and eating ? N  Using the Toilet? N  In the past six months, have you accidently leaked urine? N  Do you have problems with loss of bowel control? N  Managing your Medications? N  Managing your Finances? N  Housekeeping or managing your Housekeeping? N    Fall/Depression Screening:  PHQ 2/9 Scores 05/07/2014 05/01/2014  PHQ - 2 Score 2 0  PHQ- 9 Score 5 -    Assessment:  CSW met with patient at [facility] to assist with completion of her Advanced Directive.  Advanced Directtive reviewed and completed.  Patient states that she will have the document notarized at her local bank.  Patient expressed having no other social work needs.  Patient's case to be closed to social work.  CSW's contact information provided to patient if any social work needs arise in the future  Plan: Case to be closed to social work.     [name] [facility] Care Management [phone]

## 2014-06-19 DIAGNOSIS — I10 Essential (primary) hypertension: Secondary | ICD-10-CM | POA: Diagnosis not present

## 2014-06-19 DIAGNOSIS — D6489 Other specified anemias: Secondary | ICD-10-CM | POA: Diagnosis not present

## 2014-06-19 DIAGNOSIS — E1122 Type 2 diabetes mellitus with diabetic chronic kidney disease: Secondary | ICD-10-CM | POA: Diagnosis not present

## 2014-06-19 DIAGNOSIS — N183 Chronic kidney disease, stage 3 (moderate): Secondary | ICD-10-CM | POA: Diagnosis not present

## 2014-06-20 ENCOUNTER — Encounter: Payer: Commercial Managed Care - HMO | Admitting: *Deleted

## 2014-06-20 DIAGNOSIS — D869 Sarcoidosis, unspecified: Secondary | ICD-10-CM | POA: Diagnosis not present

## 2014-06-20 NOTE — Progress Notes (Signed)
Daily Session Note  Patient Details  Name: Mackenzie Hill MRN: [id number] Date of Birth: March 22, 1940 Referring Provider:  Idelle Crouch, MD  Encounter Date: 06/20/2014  Check In:     Session Check In - 06/20/14 1103    Check-In   Staff Present Candiss Norse MS, ACSM CEP Exercise Physiologist;Steven Way BS, ACSM EP-C, Exercise Physiologist;Laureen Janell Quiet, RRT, Respiratory Therapist   ER physicians immediately available to respond to emergencies [facility] immediately available ER MD   Physician(s) Claudette Head and Archie Balboa   Medication changes reported     No   Fall or balance concerns reported    No   Warm-up and Cool-down Performed on first and last piece of equipment   VAD Patient? No   Pain Assessment   Currently in Pain? No/denies   Multiple Pain Sites No           Exercise Prescription Changes - 06/20/14 1100    Resistance Training   Training Prescription Yes   Weight 2   Reps 10-12   Treadmill   MPH 1.6   Grade 1   Minutes 15   Recumbant Bike   Level 6   RPM 50   Minutes 15   NuStep   Level 5   Watts 65   Minutes 15      Goals Met:  Proper associated with RPD/PD & O2 Sat Independence with exercise equipment Improved SOB with ADL's Using PLB without cueing & demonstrates good technique Exercise tolerated well Personal goals reviewed Strength training completed today  Goals Unmet:  Not Applicable  Goals Comments:   Dr. Emily Filbert is Medical Director for [place] and [facility].

## 2014-06-25 ENCOUNTER — Encounter: Payer: Commercial Managed Care - HMO | Admitting: *Deleted

## 2014-06-25 DIAGNOSIS — D869 Sarcoidosis, unspecified: Secondary | ICD-10-CM | POA: Diagnosis not present

## 2014-06-25 NOTE — Progress Notes (Signed)
Daily Session Note  Patient Details  Name: MEREDITH KILBRIDE MRN: [id number] Date of Birth: 04/02/1940 Referring Provider:  Idelle Crouch, MD  Encounter Date: 06/25/2014  Check In:     Session Check In - 06/25/14 1112    Check-In   Staff Present Candiss Norse MS, ACSM CEP Exercise Physiologist;Chauntae Hults Alfonso Patten, ACSM CEP Exercise Physiologist;Laureen Janell Quiet, RRT, Respiratory Therapist;Steven Way BS, ACSM EP-C, Exercise Physiologist   ER physicians immediately available to respond to emergencies [facility] immediately available ER MD   Physician(s) Archie Balboa and Clearnce Hasten   Medication changes reported     No   Fall or balance concerns reported    No   Warm-up and Cool-down Performed on first and last piece of equipment   VAD Patient? No   Pain Assessment   Currently in Pain? No/denies   Multiple Pain Sites No         Goals Met:  Proper associated with RPD/PD & O2 Sat Independence with exercise equipment Exercise tolerated well Strength training completed today  Goals Unmet:  Not Applicable  Goals Comments:    Dr. Emily Filbert is Medical Director for [facility] and [facility].

## 2014-06-27 ENCOUNTER — Encounter: Payer: Commercial Managed Care - HMO | Admitting: *Deleted

## 2014-06-27 DIAGNOSIS — D869 Sarcoidosis, unspecified: Secondary | ICD-10-CM

## 2014-06-27 NOTE — Progress Notes (Signed)
Daily Session Note  Patient Details  Name: Mackenzie Hill MRN: [id number] Date of Birth: 12/14/1940 Referring Provider:  Idelle Crouch, MD  Encounter Date: 06/27/2014  Check In:     Session Check In - 06/27/14 1105    Check-In   Staff Present Laureen Owens Shark BS, RRT, Respiratory Therapist;Addison Freimuth Dillard Essex MS, ACSM CEP Exercise Physiologist;Steven Way BS, ACSM EP-C, Exercise Physiologist   ER physicians immediately available to respond to emergencies [facility] immediately available ER MD   Physician(s) Edd Fabian and Lord   Medication changes reported     No   Fall or balance concerns reported    No   Warm-up and Cool-down Performed on first and last piece of equipment   VAD Patient? No   Pain Assessment   Currently in Pain? No/denies   Multiple Pain Sites No         Goals Met:  Proper associated with RPD/PD & O2 Sat Independence with exercise equipment Using PLB without cueing & demonstrates good technique Exercise tolerated well Strength training completed today  Goals Unmet:  Not Applicable  Goals Comments:    Dr. Emily Filbert is Medical Director for [facility] and [facility].

## 2014-06-28 ENCOUNTER — Other Ambulatory Visit: Payer: Self-pay | Admitting: *Deleted
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
## 2014-07-02 ENCOUNTER — Encounter: Payer: Commercial Managed Care - HMO | Admitting: Dietician

## 2014-07-02 ENCOUNTER — Encounter: Payer: Self-pay | Admitting: Dietician

## 2014-07-02 ENCOUNTER — Encounter: Payer: Commercial Managed Care - HMO | Admitting: *Deleted

## 2014-07-02 VITALS — BP 133/66 | Ht 65.0 in | Wt 226.0 lb

## 2014-07-02 DIAGNOSIS — D869 Sarcoidosis, unspecified: Secondary | ICD-10-CM | POA: Diagnosis not present

## 2014-07-02 DIAGNOSIS — E119 Type 2 diabetes mellitus without complications: Secondary | ICD-10-CM

## 2014-07-02 NOTE — Progress Notes (Unsigned)
Daily Session Note  Patient Details  Name: ELIYA BUBAR MRN: [id number] Date of Birth: Jul 28, 1940 Referring Provider:  Idelle Crouch, MD  Encounter Date: 07/02/2014  Check In:     Session Check In - 07/02/14 1100    Check-In   Staff Present Laureen Owens Shark BS, RRT, Respiratory Therapist;Kelly Alfonso Patten, ACSM CEP Exercise Physiologist;Renee Dillard Essex MS, ACSM CEP Exercise Physiologist   ER physicians immediately available to respond to emergencies [facility] immediately available ER MD   Physician(s) Dr Joni Fears and Dr. Corky Downs   Medication changes reported     No   Fall or balance concerns reported    No   Warm-up and Cool-down Performed on first and last piece of equipment   VAD Patient? No   Pain Assessment   Currently in Pain? No/denies         Goals Met:  {CHL AMB CP REHAB GOALS MET:[id number]}  Goals Unmet:  {CHL AMB CP REHAB GOALS NDLOP:[id number]}  Goals Comments: ***   Dr. Emily Filbert is Medical Director for [place] and [facility].

## 2014-07-02 NOTE — Progress Notes (Signed)
Diabetes Self-Management Education  Visit Type: First/Initial  Appt. Start Time: 1315 Appt. End Time: [id number]  07/02/2014  [name], identified by name and date of birth, is a 74 y.o. female with a diagnosis of Diabetes: Type 2.  Other people present during visit:      ASSESSMENT  Blood pressure 133/66, height 5\' 5"  (1.651 m), weight 226 lb (102.513 kg). Body mass index is 37.61 kg/(m^2).  Initial Visit Information:  Are you currently following a meal plan?: Yes (decreased intake of sweets, salt and fried foods)   Are you taking your medications as prescribed?: Yes Are you checking your feet?: Yes     What is the last grade level you completed in school?: 13  Psychosocial:     Patient Belief/Attitude about Diabetes: Motivated to manage diabetes Self-care barriers: None Special Needs: None Preferred Learning Style: Auditory Learning Readiness: Ready  Complications:   How often do you check your blood sugar?: 1-2 times/day (2x/day) Fasting Blood glucose range (mg/dL): 130-179 Postprandial Blood glucose range (mg/dL): 70-129 Number of hypoglycemic episodes per month: 1 Have you had a dilated eye exam in the past 12 months?: Yes Have you had a dental exam in the past 12 months?: Yes  Diet Intake:  Breakfast:  (decreased intake of  fried foods and salt; eats sweets 2-3x/wk) Snack (evening):  (eats occasional bedtime snack-only if BG<100) Beverage(s):  (drinks mostly water)  Exercise:  Exercise: ADL's (cardiac rehab 90 min 2x/wk)  Individualized Plan for Diabetes Self-Management Training:   Learning Objective:  Patient will have a greater understanding of diabetes self-management.  Patient education plan per assessed needs and concerns is to attend individual sessions for     Education Topics Reviewed with Patient Today:  Explored patient's options for treatment of their diabetes, Definition of diabetes, type 1 and 2, and the diagnosis of diabetes Food  label reading, portion sizes and measuring food., Role of diet in the treatment of diabetes and the relationship between the three main macronutrients and blood glucose level, Carbohydrate counting, Meal timing in regards to the patients' current diabetes medication. Role of exercise on diabetes management, blood pressure control and cardiac health., Helped patient identify appropriate exercises in relation to his/her diabetes, diabetes complications and other health issue. Taught/reviewed insulin injection, site rotation, insulin storage and needle disposal., Reviewed patients medication for diabetes, action, purpose, timing of dose and side effects. Purpose and frequency of SMBG., Taught/discussed recording of test results and interpretation of SMBG., Daily foot exams, Yearly dilated eye exam Taught treatment of hypoglycemia - the 15 rule. Relationship between chronic complications and blood glucose control, Assessed and discussed foot care and prevention of foot problems, Dental care     Lifestyle issues that need to be addressed for better diabetes care, Helped patient develop diabetes management plan for (enter comment)  PATIENTS GOALS/Plan (Developed by the patient):     Plan:   Patient Instructions   Check blood sugars 2 x day before breakfast and 2 hrs after supper every day Exercise:  Continue cardiac rehab  for   60-90  minutes  2 days a week Avoid sugar sweetened drinks (soda, tea, coffee, sports drinks, juices) Avoid sweets and fried foods  Eat 3 meals day,   1  snack a day at bedtime  Space meals 4-5 hours apart Bring blood sugar records to the next appointment/class Get a Regulatory affairs officer fast acting glucose and a snack at all times Complete a 3 day food record and bring to  next appt Return for appointment  on:  07-19-14   Expected Outcomes:     Education material provided: general meal planning guidelines, Medical alert ID card, A1C handout, foot care handout    If problems or questions, patient to contact team via:  [phone]  Future DSME appointment: 2 wks

## 2014-07-02 NOTE — Patient Instructions (Signed)
  Check blood sugars 2 x day before breakfast and 2 hrs after supper every day Exercise:  Continue cardiac rehab  for   60-90  minutes  2 days a week Avoid sugar sweetened drinks (soda, tea, coffee, sports drinks, juices) Avoid sweets and fried foods  Eat 3 meals day,   1  snack a day at bedtime  Space meals 4-5 hours apart Bring blood sugar records to the next appointment/class Get a Regulatory affairs officer fast acting glucose and a snack at all times Complete a 3 day food record and bring to next appt Return for appointment  on:  07-19-14

## 2014-07-02 NOTE — Progress Notes (Unsigned)
Pulmonary Individual Treatment Plan  Patient Details  Name: Mackenzie Hill MRN: [id number] Date of Birth: 1940-10-25 Referring Provider:  Idelle Crouch, MD  Initial Encounter Date:    Visit Diagnosis: Sarcoidosis  Patient's Home Medications Hill Admission:  Current outpatient prescriptions:  .  ALBUTEROL SULFATE IN, Inhale 2.5 mg into the lungs daily as needed., Disp: , Rfl:  .  amLODipine (NORVASC) 10 MG tablet, Take 5 mg by mouth daily. , Disp: , Rfl:  .  budesonide-formoterol (SYMBICORT) 80-4.5 MCG/ACT inhaler, Inhale 2 puffs into the lungs as needed., Disp: , Rfl:  .  Calcium Carb-Cholecalciferol (CALCIUM + D3) 600-200 MG-UNIT TABS, Take 600 mg by mouth 2 (two) times daily., Disp: , Rfl:  .  carvedilol (COREG) 12.5 MG tablet, Take 12.5 mg by mouth 2 (two) times daily with a meal., Disp: , Rfl:  .  fexofenadine (ALLEGRA) 180 MG tablet, Take 180 mg by mouth daily., Disp: , Rfl:  .  furosemide (LASIX) 20 MG tablet, Take 20 mg by mouth 2 (two) times daily., Disp: , Rfl:  .  glipiZIDE (GLUCOTROL XL) 10 MG 24 hr tablet, Take 10 mg by mouth 2 (two) times daily., Disp: , Rfl:  .  isosorbide mononitrate (IMDUR) 60 MG 24 hr tablet, Take 180 mg by mouth daily., Disp: , Rfl:  .  losartan (COZAAR) 100 MG tablet, Take 100 mg by mouth daily., Disp: , Rfl:  .  metFORMIN (GLUCOPHAGE) 1000 MG tablet, Take 500 mg by mouth 2 (two) times daily with a meal. , Disp: , Rfl:  .  metFORMIN (GLUCOPHAGE) 1000 MG tablet, Take 1,500 mg by mouth every evening., Disp: , Rfl:  .  mometasone (NASONEX) 50 MCG/ACT nasal spray, Place 2 sprays into the nose daily as needed., Disp: , Rfl:  .  montelukast (SINGULAIR) 10 MG tablet, Take 10 mg by mouth daily., Disp: , Rfl:  .  sitaGLIPtin (JANUVIA) 100 MG tablet, Take 50 mg by mouth daily. , Disp: , Rfl:  .  spironolactone (ALDACTONE) 25 MG tablet, Take 25 mg by mouth daily., Disp: , Rfl:  .  vitamin B-12 (CYANOCOBALAMIN) 1000 MCG tablet, Take 1,000 mcg by mouth daily.,  Disp: , Rfl:   Past Medical History: Past Medical History  Diagnosis Date  . Cataract   . CHF (congestive heart failure)   . COPD (chronic obstructive pulmonary disease)   . Diabetes mellitus without complication   . Sarcoidosis, lung     reported by pt    Tobacco Use: History  Smoking status  . Former Smoker -- 0.50 packs/day for 3 years  . Types: Cigarettes  . Start date: 01/12/1962  . Quit date: 01/12/1965  Smokeless tobacco  . Not Hill file    Labs: Recent Review Flowsheet Data    There is no flowsheet data to display.       ADL UCSD:    Pulmonary Function Assessment:   Exercise Target Goals:    Exercise Program Goal: Individual exercise prescription set with THRR, safety & activity barriers. Participant demonstrates ability to understand and report RPE using BORG scale, to self-measure pulse accurately, and to acknowledge the importance of the exercise prescription.  Exercise Prescription Goal: Starting with aerobic activity 30 plus minutes a day, 3 days per week for initial exercise prescription. Provide home exercise prescription and guidelines that participant acknowledges understanding prior to discharge.  Activity Barriers & Risk Stratification:   6 Minute Walk:   Initial Exercise Prescription:   Exercise Prescription Changes:  Exercise Prescription Changes      05/16/14 1100 05/23/14 1100 05/28/14 1000 06/04/14 1400 06/06/14 1000   Exercise Review   Progression Yes   Yes Yes   Response to Exercise   Blood Pressure (Admit)    120/78 mmHg    Blood Pressure (Exercise)    140/72 mmHg    Blood Pressure (Exit)    114/60 mmHg    Heart Rate (Admit)    63 bpm    Heart Rate (Exercise)    87 bpm    Heart Rate (Exit)    60 bpm    Oxygen Saturation (Admit)    100 %    Oxygen Saturation (Exercise)    99 %    Oxygen Saturation (Exit)    100 %    Rating of Perceived Exertion (Exercise)    11.5    Perceived Dyspnea (Exercise)    3    Resistance  Training   Training Prescription    Yes    Weight    2    Reps    10-12    Treadmill   MPH  1.6  1.6    Grade  0  1    Minutes    15    Recumbant Bike   Level    4 5   RPM    50 50   Minutes    15 15   NuStep   Level 5  -- 5    Watts 55  -- 65    Minutes    15    REL-XR   Level   --     Watts   --       06/20/14 1100 06/26/14 0600 06/27/14 1500       Exercise Review   Progression  Yes Yes     Response to Exercise   Blood Pressure (Admit)  120/78 mmHg 120/70 mmHg     Blood Pressure (Exercise)  144/80 mmHg 142/72 mmHg     Blood Pressure (Exit)  114/70 mmHg 106/74 mmHg     Heart Rate (Admit)  64 bpm  64 58 bpm     Heart Rate (Exercise)  90 bpm 91 bpm     Heart Rate (Exit)  71 bpm 66 bpm     Oxygen Saturation (Admit)  99 % 10099 %     Oxygen Saturation (Exercise)  99 % 97 %     Oxygen Saturation (Exit)  100 % 12 %     Rating of Perceived Exertion (Exercise)  12 2     Perceived Dyspnea (Exercise)  2      Resistance Training   Training Prescription Yes       Weight 2       Reps 10-12       Treadmill   MPH 1.6       Grade 1       Minutes 15       Recumbant Bike   Level 6       RPM 50       Minutes 15       NuStep   Level 5       Watts 65       Minutes 15          Discharge Exercise Prescription:    Nutrition:  Target Goals: Understanding of nutrition guidelines, daily intake of sodium 1500mg , cholesterol 200mg , calories 30% from fat and 7% or less from saturated fats, daily to  have 5 or more servings of fruits and vegetables.  Biometrics:    Nutrition Therapy Plan and Nutrition Goals:   Nutrition Discharge: Rate Your Plate Scores:   Psychosocial: Target Goals: Acknowledge presence or absence of depression, maximize coping skills, provide positive support system. Participant is able to verbalize types and ability to use techniques and skills needed for reducing stress and depression.  Initial Review & Psychosocial Screening:   Quality of Life  Scores:   PHQ-9:     Recent Review Flowsheet Data    Depression screen [facility] 2/9 05/07/2014 05/01/2014   Decreased Interest 1 0   Down, Depressed, Hopeless 1 0   PHQ - 2 Score 2 0   Altered sleeping 1 -   Tired, decreased energy 1 -   Change in appetite 1 -   Feeling bad or failure about yourself  0 -   Trouble concentrating 0 -   Moving slowly or fidgety/restless 0 -   Suicidal thoughts 0 -   PHQ-9 Score 5 -   Difficult doing work/chores Not difficult at all -      Psychosocial Evaluation and Intervention:   Psychosocial Re-Evaluation:  Education: Education Goals: Education classes will be provided Hill a weekly basis, covering required topics. Participant will state understanding/return demonstration of topics presented.  Learning Barriers/Preferences:   Education Topics: Initial Evaluation Education: - Verbal, written and demonstration of respiratory meds, RPE/PD scales, oximetry and breathing techniques. Instruction Hill use of nebulizers and MDIs: cleaning and proper use, rinsing mouth with steroid doses and importance of monitoring MDI activations.   General Nutrition Guidelines/Fats and Fiber: -Group instruction provided by verbal, written material, models and posters to present the general guidelines for heart healthy nutrition. Gives an explanation and review of dietary fats and fiber.   Controlling Sodium/Reading Food Labels: -Group verbal and written material supporting the discussion of sodium use in heart healthy nutrition. Review and explanation with models, verbal and written materials for utilization of the food label.   Exercise Physiology & Risk Factors: - Group verbal and written instruction with models to review the exercise physiology of the cardiovascular system and associated critical values. Details cardiovascular disease risk factors and the goals associated with each risk factor.   Aerobic Exercise & Resistance Training: - Gives group verbal and  written discussion Hill the health impact of inactivity. Hill the components of aerobic and resistive training programs and the benefits of this training and how to safely progress through these programs.   Flexibility, Balance, General Exercise Guidelines: - Provides group verbal and written instruction Hill the benefits of flexibility and balance training programs. Provides general exercise guidelines with specific guidelines to those with heart or lung disease. Demonstration and skill practice provided.   Stress Management: - Provides group verbal and written instruction about the health risks of elevated stress, cause of high stress, and healthy ways to reduce stress.   Depression: - Provides group verbal and written instruction Hill the correlation between heart/lung disease and depressed mood, treatment options, and the stigmas associated with seeking treatment.   Exercise & Equipment Safety: - Individual verbal instruction and demonstration of equipment use and safety with use of the equipment.   Infection Prevention: - Provides verbal and written material to individual with discussion of infection control including proper hand washing and proper equipment cleaning during exercise session.   Falls Prevention: - Provides verbal and written material to individual with discussion of falls prevention and safety.   Diabetes: - Individual verbal and  written instruction to review signs/symptoms of diabetes, desired ranges of glucose level fasting, after meals and with exercise. Advice that pre and post exercise glucose checks will be done for 3 sessions at entry of program.   Chronic Lung Diseases: - Group verbal and written instruction to review new updates, new respiratory medications, new advancements in procedures and treatments. Provide informative websites and "800" numbers of self-education.   Lung Procedures: - Group verbal and written instruction to describe testing methods done  to diagnose lung disease. Review the outcome of test results. Describe the treatment choices: Pulmonary Function Tests, ABGs and oximetry.   Energy Conservation: - Provide group verbal and written instruction for methods to conserve energy, plan and organize activities. Instruct Hill pacing techniques, use of adaptive equipment and posture/positioning to relieve shortness of breath.   Triggers: - Group verbal and written instruction to review types of environmental controls: home humidity, furnaces, filters, dust mite/pet prevention, HEPA vacuums. To discuss weather changes, air quality and the benefits of nasal washing.   Exacerbations: - Group verbal and written instruction to provide: warning signs, infection symptoms, calling MD promptly, preventive modes, and value of vaccinations. Review: effective airway clearance, coughing and/or vibration techniques. Create an Sports administrator.   Oxygen: - Individual and group verbal and written instruction Hill oxygen therapy. Includes supplement oxygen, available portable oxygen systems, continuous and intermittent flow rates, oxygen safety, concentrators, and Medicare reimbursement for oxygen.   Respiratory Medications: - Group verbal and written instruction to review medications for lung disease. Drug class, frequency, complications, importance of spacers, rinsing mouth after steroid MDI's, and proper cleaning methods for nebulizers.   AED/CPR: - Group verbal and written instruction with the use of models to demonstrate the basic use of the AED with the basic ABC's of resuscitation.   Breathing Retraining: - Provides individuals verbal and written instruction Hill purpose, frequency, and proper technique of diaphragmatic breathing and pursed-lipped breathing. Applies individual practice skills.   Anatomy and Physiology of the Lungs: - Group verbal and written instruction with the use of models to provide basic lung anatomy and physiology related to  function, structure and complications of lung disease.   Heart Failure: - Group verbal and written instruction Hill the basics of heart failure: signs/symptoms, treatments, explanation of ejection fraction, enlarged heart and cardiomyopathy.   Sleep Apnea: - Individual verbal and written instruction to review Obstructive Sleep Apnea. Review of risk factors, methods for diagnosing and types of masks and machines for OSA.   Anxiety: - Provides group, verbal and written instruction Hill the correlation between heart/lung disease and anxiety, treatment options, and management of anxiety.   Relaxation: - Provides group, verbal and written instruction about the benefits of relaxation for patients with heart/lung disease. Also provides patients with examples of relaxation techniques.   Knowledge Questionnaire Score:   Personal Goals and Risk Factors at Admission:     Personal Goals and Risk Factors at Admission - 03/27/14 0900    Personal Goals and Risk Factors Hill Admission   Increase Aerobic Exercise and Physical Activity Yes   Intervention While in program, learn and follow the exercise prescription taught. Start at a low level workload and increase workload after able to maintain previous level for 30 minutes. Increase time before increasing intensity.   Understand more about Heart/Pulmonary Disease. Yes   Intervention While in program utilize professionals for any questions, and attend the education sessions. Great websites to use are www.americanheart.org or www.lung.org for reliable information.   Improve shortness  of breath with ADL's Yes   Intervention While in program, learn and follow the exercise prescription taught. Start at a low level workload and increase workload ad advised by the exercise physiologist. Increase time before increasing intensity.   Develop more efficient breathing techniques such as purse lipped breathing and diaphragmatic breathing; and practicing self-pacing  with activity Yes   Intervention While in program, learn and utilize the specific breathing techniques taught to you. Continue to practice and use the techniques as needed.   Increase knowledge of respiratory medications and ability to use respiratory devices properly.  Yes   Intervention While in program, learn to administer MDI, nebulizer, and spacer properly.;Learn to take respiratory medicine as ordered.;While in program, learn to Clean MDI, nebulizers, and spacers properly.   Hypertension Yes   Goal Participant will see blood pressure controlled within the values of 140/88mm/Hg or within value directed by their physician.   Intervention Provide nutrition & aerobic exercise along with prescribed medications to achieve BP 140/90 or less.      Personal Goals and Risk Factors Review:      Goals and Risk Factor Review      05/30/14 1536 06/20/14 1107 06/25/14 1544       Increase Aerobic Exercise and Physical Activity   Goals Progress/Improvement seen  Yes Yes Yes     Comments Mackenzie Hill was increasing to Level 5 Hill the  NS, however this level proved too difficult for Mackenzie Hill to maintain a high enough speed for Mackenzie Hill to get an aerobic workout and related benefits. Instead of increasing Mackenzie Hill level, we are having Mackenzie Hill focus Hill keeping Mackenzie Hill Watt  level  high in order for Mackenzie Hill to get a quality aerobic workout. She will focus Hill keeping Mackenzie Hill watts above 40.  Is continuing to progress Hill all the equipment Hill a weekly baisis. Can walk Hill the treadmill now with a lot less SOB Mackenzie Hill is nearing the end of [facility] and discussed with Mackenzie Hill Mackenzie Hill discharge plans for exercise. 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     Understand more about Heart/Pulmonary Disease   Goals Progress/Improvement seen   Yes      Comments  Has enjoyed medication education lecture the most so far.       Improve shortness of breath with ADL's   Goals Progress/Improvement seen   Yes      Comments  Has less SOB when walking Hill the  treadmill and when doing Mackenzie Hill day to day activities      Breathing Techniques   Goals Progress/Improvement seen   Yes      Increase knowledge of respiratory medications   Goals Progress/Improvement seen   Yes      Comments  Has three different inhalers and is more comfortable knowing the differences between the three and how each one helps Mackenzie Hill.       Hypertension   Progress seen toward goals  Yes      Comments  Post exercise blood pressures have all been <140/<90         Personal Goals Discharge:      Personal Goals at Discharge - 05/28/14 1000    Increase Aerobic Exercise and Physical Activity   Goals Progress/Improvement seen  Yes   Comments Mackenzie Hill washed Mackenzie Hill trim Hill he house the other day - she is noticing increased stamina and applied pacing to Mackenzie Hill activity.      Comments: 30 day review

## 2014-07-03 ENCOUNTER — Encounter: Payer: Self-pay | Admitting: Internal Medicine

## 2014-07-03 DIAGNOSIS — D869 Sarcoidosis, unspecified: Secondary | ICD-10-CM

## 2014-07-04 DIAGNOSIS — D869 Sarcoidosis, unspecified: Secondary | ICD-10-CM

## 2014-07-04 NOTE — Progress Notes (Signed)
Daily Session Note  Patient Details  Name: Mackenzie Hill MRN: [id number] Date of Birth: 03/20/1940 Referring Provider:  Sparks, Jeffrey D, MD  Encounter Date: 07/04/2014  Check In:     Session Check In - 07/04/14 1140    Check-In   Staff Present Steven Way BS, ACSM EP-C, Exercise Physiologist;Renee MacMillan MS, ACSM CEP Exercise Physiologist;Laureen Brown BS, RRT, Respiratory Therapist   ER physicians immediately available to respond to emergencies [facility] immediately available ER MD   Physician(s) Kaminski and Lord   Medication changes reported     No   Fall or balance concerns reported    No   Warm-up and Cool-down Performed on first and last piece of equipment   VAD Patient? No   Pain Assessment   Currently in Pain? No/denies         Goals Met:  Proper associated with RPD/PD & O2 Sat Exercise tolerated well No report of cardiac concerns or symptoms Strength training completed today  Goals Unmet:  Not Applicable  Goals Comments:    Dr. Mark Miller is Medical Director for [facility] and [facility]. 

## 2014-07-09 ENCOUNTER — Encounter: Payer: Commercial Managed Care - HMO | Admitting: *Deleted

## 2014-07-09 DIAGNOSIS — D869 Sarcoidosis, unspecified: Secondary | ICD-10-CM | POA: Diagnosis not present

## 2014-07-09 NOTE — Progress Notes (Signed)
Daily Session Note  Patient Details  Name: Mackenzie Hill MRN: [id number] Date of Birth: [birth year] Referring Provider:  Idelle Crouch, MD  Encounter Date: 07/09/2014  Check In:     Session Check In - 07/09/14 1110    Check-In   Staff Present Earlean Shawl BS, ACSM CEP Exercise Physiologist;Laureen Janell Quiet, RRT, Respiratory Therapist;Steven Way BS, ACSM EP-C, Exercise Physiologist   ER physicians immediately available to respond to emergencies [facility] immediately available ER MD   Physician(s) Dr. Edd Fabian and Dr. Reita Cliche   Medication changes reported     No   Fall or balance concerns reported    No   Warm-up and Cool-down Performed on first and last piece of equipment   VAD Patient? No   Pain Assessment   Currently in Pain? No/denies   Multiple Pain Sites No         Goals Met:  Proper associated with RPD/PD & O2 Sat Independence with exercise equipment Exercise tolerated well Strength training completed today  Goals Unmet:  Not Applicable  Goals Comments:    Dr. Emily Filbert is Medical Director for [facility] and [facility].

## 2014-07-11 ENCOUNTER — Other Ambulatory Visit: Payer: Self-pay | Admitting: *Deleted

## 2014-07-11 DIAGNOSIS — D869 Sarcoidosis, unspecified: Secondary | ICD-10-CM | POA: Diagnosis not present

## 2014-07-11 NOTE — Progress Notes (Signed)
Daily Session Note  Patient Details  Name: Mackenzie Hill MRN: [id number] Date of Birth: Aug 20, 1940 Referring Provider:  Idelle Crouch, MD  Encounter Date: 07/11/2014  Check In:     Session Check In - 07/11/14 1057    Check-In   Staff Present Candiss Norse MS, ACSM CEP Exercise Physiologist;Dornell Grasmick BS, ACSM EP-C, Exercise Physiologist;Laureen Janell Quiet, RRT, Respiratory Therapist   ER physicians immediately available to respond to emergencies [facility] immediately available ER MD   Physician(s) goodman and stafford   Medication changes reported     No   Fall or balance concerns reported    No   Warm-up and Cool-down Performed on first and last piece of equipment   VAD Patient? No   Pain Assessment   Currently in Pain? No/denies         Goals Met:  Proper associated with RPD/PD & O2 Sat Exercise tolerated well No report of cardiac concerns or symptoms Strength training completed today  Goals Unmet:  Not Applicable  Goals Comments:    Dr. Emily Filbert is Medical Director for [facility] and [facility].

## 2014-07-12 DIAGNOSIS — N183 Chronic kidney disease, stage 3 (moderate): Secondary | ICD-10-CM | POA: Diagnosis not present

## 2014-07-12 DIAGNOSIS — E119 Type 2 diabetes mellitus without complications: Secondary | ICD-10-CM | POA: Diagnosis not present

## 2014-07-12 DIAGNOSIS — I10 Essential (primary) hypertension: Secondary | ICD-10-CM | POA: Diagnosis not present
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
## 2014-07-13 ENCOUNTER — Encounter: Payer: Commercial Managed Care - HMO | Attending: Internal Medicine

## 2014-07-13 DIAGNOSIS — D869 Sarcoidosis, unspecified: Secondary | ICD-10-CM | POA: Insufficient documentation

## 2014-07-18 ENCOUNTER — Encounter: Payer: Self-pay | Admitting: Internal Medicine

## 2014-07-18 DIAGNOSIS — D869 Sarcoidosis, unspecified: Secondary | ICD-10-CM

## 2014-07-18 NOTE — Patient Outreach (Signed)
[facility]) Care Management  07/12/2014  Mackenzie Hill [birth year] [id number]   Notification from [facility], LCSW to close SW portion of case due to goals met.  [name]. [place], [place] Management [place] Assistant Phone: [phone] Fax: [phone]

## 2014-07-18 NOTE — Progress Notes (Signed)
Daily Session Note  Patient Details  Name: ASHERAH LAVOY MRN: [id number] Date of Birth: 11/21/1940 Referring Provider:  Erby Pian, MD  Encounter Date: 07/18/2014  Check In:     Session Check In - 07/18/14 1024    Check-In   Staff Present Lestine Box BS, ACSM EP-C, Exercise Physiologist;Renee Dillard Essex MS, ACSM CEP Exercise Physiologist;Laureen Janell Quiet, RRT, Respiratory Therapist   ER physicians immediately available to respond to emergencies [facility] immediately available ER MD   Physician(s) Joni Fears and Thomasene Lot   Medication changes reported     No   Fall or balance concerns reported    No   Warm-up and Cool-down Performed on first and last piece of equipment   VAD Patient? No   Pain Assessment   Currently in Pain? No/denies           Exercise Prescription Changes - 07/18/14 0700    Exercise Review   Progression Yes   Response to Exercise   Blood Pressure (Admit) 138/74 mmHg  Date 07/11/14 35th session of [facility]   Blood Pressure (Exercise) 160/80 mmHg   Blood Pressure (Exit) 128/80 mmHg   Heart Rate (Admit) 73 bpm   Heart Rate (Exercise) 81 bpm   Heart Rate (Exit) 62 bpm   Oxygen Saturation (Admit) 100 %   Oxygen Saturation (Exercise) 100 %   Oxygen Saturation (Exit) 100 %   Rating of Perceived Exertion (Exercise) 11   Perceived Dyspnea (Exercise) 2   Frequency Add 2 additional days to program exercise sessions.   Duration Progress to 50 minutes of aerobic without signs/symptoms of physical distress   Intensity Rest + 30   Progression Continue progressive overload as per policy without signs/symptoms or physical distress.   Resistance Training   Training Prescription Yes   Weight 2   Reps 10-12   Treadmill   MPH 1.6   Grade 2   Minutes 15   Recumbant Bike   Level 6   RPM 50   Minutes 15   NuStep   Level 5   Watts 50   Minutes 15      Goals Met:  Proper associated with RPD/PD & O2 Sat Independence with exercise equipment Improved SOB  with ADL's Using PLB without cueing & demonstrates good technique Exercise tolerated well Personal goals reviewed Strength training completed today  Goals Unmet:  Not Applicable  Goals Comments: Ms Brownrigg completed her 82 session of [place] and will continue exercising in [employer].  Dr. Emily Filbert is Medical Director for [place] and [facility].

## 2014-07-18 NOTE — Progress Notes (Signed)
Daily Session Note  Patient Details  Name: Mackenzie Hill MRN: [id number] Date of Birth: 12/23/1940 Referring Provider:  Erby Pian, MD  Encounter Date: 07/18/2014  Check In:     Session Check In - 07/18/14 1024    Check-In   Staff Present Lestine Box BS, ACSM EP-C, Exercise Physiologist;Renee Dillard Essex MS, ACSM CEP Exercise Physiologist;Laureen Janell Quiet, RRT, Respiratory Therapist   ER physicians immediately available to respond to emergencies [facility] immediately available ER MD   Physician(s) Joni Fears and Thomasene Lot   Medication changes reported     No   Fall or balance concerns reported    No   Warm-up and Cool-down Performed on first and last piece of equipment   VAD Patient? No   Pain Assessment   Currently in Pain? No/denies           Exercise Prescription Changes - 07/18/14 0700    Exercise Review   Progression Yes   Response to Exercise   Blood Pressure (Admit) 138/74 mmHg  Date 07/11/14 35th session of [facility]   Blood Pressure (Exercise) 160/80 mmHg   Blood Pressure (Exit) 128/80 mmHg   Heart Rate (Admit) 73 bpm   Heart Rate (Exercise) 81 bpm   Heart Rate (Exit) 62 bpm   Oxygen Saturation (Admit) 100 %   Oxygen Saturation (Exercise) 100 %   Oxygen Saturation (Exit) 100 %   Rating of Perceived Exertion (Exercise) 11   Perceived Dyspnea (Exercise) 2   Frequency Add 2 additional days to program exercise sessions.   Duration Progress to 50 minutes of aerobic without signs/symptoms of physical distress   Intensity Rest + 30   Progression Continue progressive overload as per policy without signs/symptoms or physical distress.   Resistance Training   Training Prescription Yes   Weight 2   Reps 10-12   Treadmill   MPH 1.6   Grade 2   Minutes 15   Recumbant Bike   Level 6   RPM 50   Minutes 15   NuStep   Level 5   Watts 50   Minutes 15      Goals Met:  Proper associated with RPD/PD & O2 Sat Exercise tolerated well No report of cardiac  concerns or symptoms Strength training completed today  Goals Unmet:  Not Applicable  Goals Comments:    Dr. Emily Filbert is Medical Director for [place] and [facility].

## 2014-07-18 NOTE — Progress Notes (Signed)
Pulmonary Individual Treatment Plan  Patient Details  Name: Mackenzie Hill MRN: [id number] Date of Birth: Apr 11, 1940 Referring Provider:  Erby Pian, MD  Initial Encounter Date: 03/27/14  Visit Diagnosis: Sarcoidosis  Patient's Home Medications on Admission:  Current outpatient prescriptions:    albuterol (PROVENTIL) (2.5 MG/3ML) 0.083% nebulizer solution, Inhale into the lungs., Disp: , Rfl:    albuterol (VENTOLIN HFA) 108 (90 BASE) MCG/ACT inhaler, Inhale into the lungs., Disp: , Rfl:    amLODipine (NORVASC) 5 MG tablet, Take 1 tablet by mouth daily., Disp: , Rfl:    brimonidine (ALPHAGAN) 0.15 % ophthalmic solution, , Disp: , Rfl:    budesonide-formoterol (SYMBICORT) 160-4.5 MCG/ACT inhaler, Inhale 2 puffs into the lungs 2 (two) times daily., Disp: , Rfl:    Calcium Carbonate-Vitamin D 600-400 MG-UNIT per tablet, Take 2 tablets by mouth daily., Disp: , Rfl:    carvedilol (COREG) 12.5 MG tablet, Take 12.5 mg by mouth 2 (two) times daily with a meal., Disp: , Rfl:    Cyanocobalamin (RA VITAMIN B-12 TR) 1000 MCG TBCR, Take 1 tablet by mouth daily., Disp: , Rfl:    fexofenadine (ALLEGRA) 180 MG tablet, Take 180 mg by mouth daily., Disp: , Rfl:    furosemide (LASIX) 20 MG tablet, Take 1 tablet by mouth 2 (two) times daily., Disp: , Rfl:    glipiZIDE (GLUCOTROL XL) 10 MG 24 hr tablet, Take 10 mg by mouth 2 (two) times daily., Disp: , Rfl:    isosorbide mononitrate (IMDUR) 60 MG 24 hr tablet, Take 3 tablets by mouth daily., Disp: , Rfl:    losartan (COZAAR) 50 MG tablet, Take 2 tablets by mouth daily., Disp: , Rfl:    metFORMIN (GLUCOPHAGE) 1000 MG tablet, Take 500 mg by mouth 2 (two) times daily with a meal. , Disp: , Rfl:    mometasone (NASONEX) 50 MCG/ACT nasal spray, Place 2 sprays into the nose daily as needed., Disp: , Rfl:    montelukast (SINGULAIR) 10 MG tablet, Take 10 mg by mouth daily., Disp: , Rfl:    sitaGLIPtin (JANUVIA) 50 MG tablet, Take by mouth  daily., Disp: , Rfl:    spironolactone (ALDACTONE) 25 MG tablet, Take 25 mg by mouth daily., Disp: , Rfl:    vitamin C (ASCORBIC ACID) 500 MG tablet, Take by mouth., Disp: , Rfl:   Past Medical History: Past Medical History  Diagnosis Date   Cataract    CHF (congestive heart failure)    COPD (chronic obstructive pulmonary disease)    Diabetes mellitus without complication    Sarcoidosis, lung     reported by pt   Hypertension    Gout    Arthritis    Sleep apnea     Tobacco Use: History  Smoking status   Former Smoker -- 0.50 packs/day for 3 years   Types: Cigarettes   Start date: 01/12/1962   Quit date: 01/12/1965  Smokeless tobacco   Not on file    Labs: Recent Review Flowsheet Data    There is no flowsheet data to display.       ADL UCSD:     ADL UCSD      03/27/14 0900 05/07/14 1000 07/02/14 1000   ADL UCSD   ADL Phase Entry Mid Exit   SOB Score total 93 78 53   Rest 0 3 0   Walk '4 3 2   ' Stairs '4 4 3   ' Bath '5 3 3   ' Dress 4 3 2  Shop '4 4 3       ' Pulmonary Function Assessment:   Exercise Target Goals:    Exercise Program Goal: Individual exercise prescription set with THRR, safety & activity barriers. Participant demonstrates ability to understand and report RPE using BORG scale, to self-measure pulse accurately, and to acknowledge the importance of the exercise prescription.  Exercise Prescription Goal: Starting with aerobic activity 30 plus minutes a day, 3 days per week for initial exercise prescription. Provide home exercise prescription and guidelines that participant acknowledges understanding prior to discharge.  Activity Barriers & Risk Stratification:   6 Minute Walk:     6 Minute Walk      03/27/14 0900 05/04/14 1000 07/04/14 1141   6 Minute Walk   Phase Initial Mid Program Discharge   Distance 940 feet 900 feet 930 feet   Walk Time 6 minutes 6 minutes 6 minutes   Resting HR 60 bpm 65 bpm 60 bpm   Resting BP  122/68 mmHg 128/70 mmHg 130/78 mmHg   Max Ex. HR 67 bpm 72 bpm 57 bpm   Max Ex. BP 148/70 mmHg 134/68 mmHg 140/64 mmHg   RPE '12 13 11   ' Perceived Dyspnea  '3 2 2   ' Symptoms   No      Initial Exercise Prescription:   Exercise Prescription Changes:     Exercise Prescription Changes      05/16/14 1100 05/23/14 1100 05/28/14 1000 06/04/14 1400 06/06/14 1000   Exercise Review   Progression Yes   Yes Yes   Response to Exercise   Blood Pressure (Admit)    120/78 mmHg    Blood Pressure (Exercise)    140/72 mmHg    Blood Pressure (Exit)    114/60 mmHg    Heart Rate (Admit)    63 bpm    Heart Rate (Exercise)    87 bpm    Heart Rate (Exit)    60 bpm    Oxygen Saturation (Admit)    100 %    Oxygen Saturation (Exercise)    99 %    Oxygen Saturation (Exit)    100 %    Rating of Perceived Exertion (Exercise)    11.5    Perceived Dyspnea (Exercise)    3    Resistance Training   Training Prescription    Yes    Weight    2    Reps    10-12    Treadmill   MPH  1.6  1.6    Grade  0  1    Minutes    15    Recumbant Bike   Level    4 5   RPM    50 50   Minutes    15 15   NuStep   Level 5  -- 5    Watts 55  -- 65    Minutes    15    REL-XR   Level   --     Watts   --       06/20/14 1100 06/26/14 0600 06/27/14 1500 07/18/14 0700     Exercise Review   Progression  Yes Yes Yes    Response to Exercise   Blood Pressure (Admit)  120/78 mmHg 120/70 mmHg 138/74 mmHg  Date 07/11/14 35th session of [facility]    Blood Pressure (Exercise)  144/80 mmHg 142/72 mmHg 160/80 mmHg    Blood Pressure (Exit)  114/70 mmHg 106/74 mmHg 128/80 mmHg    Heart Rate (Admit)  64 bpm  64 58 bpm 73 bpm    Heart Rate (Exercise)  90 bpm 91 bpm 81 bpm    Heart Rate (Exit)  71 bpm 66 bpm 62 bpm    Oxygen Saturation (Admit)  99 % 10099 % 100 %    Oxygen Saturation (Exercise)  99 % 97 % 100 %    Oxygen Saturation (Exit)  100 % 12 % 100 %    Rating of Perceived Exertion (Exercise)  '12 2 11    ' Perceived Dyspnea  (Exercise)  2  2    Frequency    Add 2 additional days to program exercise sessions.    Duration    Progress to 50 minutes of aerobic without signs/symptoms of physical distress    Intensity    Rest + 30    Progression    Continue progressive overload as per policy without signs/symptoms or physical distress.    Resistance Training   Training Prescription Yes   Yes    Weight 2   2    Reps 10-12   10-12    Treadmill   MPH 1.6   1.6    Grade 1   2    Minutes 15   15    Recumbant Bike   Level 6   6    RPM 50   50    Minutes 15   15    NuStep   Level 5   5    Watts 65   50    Minutes 15   15       Discharge Exercise Prescription (Final Exercise Prescription Changes):     Exercise Prescription Changes - 07/18/14 0700    Exercise Review   Progression Yes   Response to Exercise   Blood Pressure (Admit) 138/74 mmHg  Date 07/11/14 35th session of [facility]   Blood Pressure (Exercise) 160/80 mmHg   Blood Pressure (Exit) 128/80 mmHg   Heart Rate (Admit) 73 bpm   Heart Rate (Exercise) 81 bpm   Heart Rate (Exit) 62 bpm   Oxygen Saturation (Admit) 100 %   Oxygen Saturation (Exercise) 100 %   Oxygen Saturation (Exit) 100 %   Rating of Perceived Exertion (Exercise) 11   Perceived Dyspnea (Exercise) 2   Frequency Add 2 additional days to program exercise sessions.   Duration Progress to 50 minutes of aerobic without signs/symptoms of physical distress   Intensity Rest + 30   Progression Continue progressive overload as per policy without signs/symptoms or physical distress.   Resistance Training   Training Prescription Yes   Weight 2   Reps 10-12   Treadmill   MPH 1.6   Grade 2   Minutes 15   Recumbant Bike   Level 6   RPM 50   Minutes 15   NuStep   Level 5   Watts 50   Minutes 15       Nutrition:  Target Goals: Understanding of nutrition guidelines, daily intake of sodium <1543m, cholesterol <2072m calories 30% from fat and 7% or less from saturated fats, daily to  have 5 or more servings of fruits and vegetables.  Biometrics:    Nutrition Therapy Plan and Nutrition Goals:   Nutrition Discharge: Rate Your Plate Scores:   Psychosocial: Target Goals: Acknowledge presence or absence of depression, maximize coping skills, provide positive support system. Participant is able to verbalize types and ability to use techniques and skills needed for reducing stress and depression.  Initial Review &  Psychosocial Screening:   Quality of Life Scores:   PHQ-9:     Recent Review Flowsheet Data    Depression screen [facility] 2/9 07/02/2014 05/07/2014 05/01/2014   Decreased Interest 0 1 0   Down, Depressed, Hopeless 0 1 0   PHQ - 2 Score 0 2 0   Altered sleeping - 1 -   Tired, decreased energy - 1 -   Change in appetite - 1 -   Feeling bad or failure about yourself  - 0 -   Trouble concentrating - 0 -   Moving slowly or fidgety/restless - 0 -   Suicidal thoughts - 0 -   PHQ-9 Score - 5 -   Difficult doing work/chores - Not difficult at all -      Psychosocial Evaluation and Intervention:   Psychosocial Re-Evaluation:     Psychosocial Re-Evaluation      07/11/14 1054           Psychosocial Re-Evaluation   Comments Counselor met with Ms. Wohlford today for follow up evaluation.  She reports progress made in this program so far as breathing better, legs are stronger; and she is able to work out in the yard now and mow which was not possible when she began initially.  Her appetite has decreased; which she reports is probably a "good thing" and she continues to sleep well.  Ms. Kramar states she has benefitted as well from the educational components of this program; especially the nutrition education.  She will maintain her progress as a participant in a [employer] program she has joined.           Education: Education Goals: Education classes will be provided on a weekly basis, covering required topics. Participant will state understanding/return  demonstration of topics presented.  Learning Barriers/Preferences:   Education Topics: Initial Evaluation Education: - Verbal, written and demonstration of respiratory meds, RPE/PD scales, oximetry and breathing techniques. Instruction on use of nebulizers and MDIs: cleaning and proper use, rinsing mouth with steroid doses and importance of monitoring MDI activations.   General Nutrition Guidelines/Fats and Fiber: -Group instruction provided by verbal, written material, models and posters to present the general guidelines for heart healthy nutrition. Gives an explanation and review of dietary fats and fiber.          Most Recent Value   Date  07/09/14   Educator  CR   Instruction Review Code  2- meets goals/outcomes      Controlling Sodium/Reading Food Labels: -Group verbal and written material supporting the discussion of sodium use in heart healthy nutrition. Review and explanation with models, verbal and written materials for utilization of the food label.   Exercise Physiology & Risk Factors: - Group verbal and written instruction with models to review the exercise physiology of the cardiovascular system and associated critical values. Details cardiovascular disease risk factors and the goals associated with each risk factor.   Aerobic Exercise & Resistance Training: - Gives group verbal and written discussion on the health impact of inactivity. On the components of aerobic and resistive training programs and the benefits of this training and how to safely progress through these programs.      Most Recent Value   Date  05/16/14   Educator  [address]   Instruction Review Code  2- meets goals/outcomes      Flexibility, Balance, General Exercise Guidelines: - Provides group verbal and written instruction on the benefits of flexibility and balance training programs. Provides general exercise  guidelines with specific guidelines to those with heart or lung disease. Demonstration  and skill practice provided.      Most Recent Value   Date  05/16/14   Educator  [address]   Instruction Review Code  2- meets goals/outcomes      Stress Management: - Provides group verbal and written instruction about the health risks of elevated stress, cause of high stress, and healthy ways to reduce stress.      Most Recent Value   Date  06/13/14   Educator  [facility]   Instruction Review Code  2- meets goals/outcomes      Depression: - Provides group verbal and written instruction on the correlation between heart/lung disease and depressed mood, treatment options, and the stigmas associated with seeking treatment.      Most Recent Value   Date  06/27/14   Educator  [facility]   Instruction Review Code  2- meets goals/outcomes      Exercise & Equipment Safety: - Individual verbal instruction and demonstration of equipment use and safety with use of the equipment.   Infection Prevention: - Provides verbal and written material to individual with discussion of infection control including proper hand washing and proper equipment cleaning during exercise session.   Falls Prevention: - Provides verbal and written material to individual with discussion of falls prevention and safety.   Diabetes: - Individual verbal and written instruction to review signs/symptoms of diabetes, desired ranges of glucose level fasting, after meals and with exercise. Advice that pre and post exercise glucose checks will be done for 3 sessions at entry of program.   Chronic Lung Diseases: - Group verbal and written instruction to review new updates, new respiratory medications, new advancements in procedures and treatments. Provide informative websites and "800" numbers of self-education.      Most Recent Value   Date  05/21/14   Educator  LB   Instruction Review Code  2- meets goals/outcomes      Lung Procedures: - Group verbal and written instruction to describe testing methods done to diagnose lung  disease. Review the outcome of test results. Describe the treatment choices: Pulmonary Function Tests, ABGs and oximetry.   Energy Conservation: - Provide group verbal and written instruction for methods to conserve energy, plan and organize activities. Instruct on pacing techniques, use of adaptive equipment and posture/positioning to relieve shortness of breath.      Most Recent Value   Date  06/06/14   Educator  SW   Instruction Review Code  2- meets goals/outcomes      Triggers: - Group verbal and written instruction to review types of environmental controls: home humidity, furnaces, filters, dust mite/pet prevention, HEPA vacuums. To discuss weather changes, air quality and the benefits of nasal washing.   Exacerbations: - Group verbal and written instruction to provide: warning signs, infection symptoms, calling MD promptly, preventive modes, and value of vaccinations. Review: effective airway clearance, coughing and/or vibration techniques. Create an Sports administrator.      Most Recent Value   Date  06/18/14   Educator  L. Owens Shark   Instruction Review Code  2- meets goals/outcomes      Oxygen: - Individual and group verbal and written instruction on oxygen therapy. Includes supplement oxygen, available portable oxygen systems, continuous and intermittent flow rates, oxygen safety, concentrators, and Medicare reimbursement for oxygen.   Respiratory Medications: - Group verbal and written instruction to review medications for lung disease. Drug class, frequency, complications, importance of spacers, rinsing  mouth after steroid MDI's, and proper cleaning methods for nebulizers.   AED/CPR: - Group verbal and written instruction with the use of models to demonstrate the basic use of the AED with the basic ABC's of resuscitation.   Breathing Retraining: - Provides individuals verbal and written instruction on purpose, frequency, and proper technique of diaphragmatic breathing and  pursed-lipped breathing. Applies individual practice skills.   Anatomy and Physiology of the Lungs: - Group verbal and written instruction with the use of models to provide basic lung anatomy and physiology related to function, structure and complications of lung disease.   Heart Failure: - Group verbal and written instruction on the basics of heart failure: signs/symptoms, treatments, explanation of ejection fraction, enlarged heart and cardiomyopathy.   Sleep Apnea: - Individual verbal and written instruction to review Obstructive Sleep Apnea. Review of risk factors, methods for diagnosing and types of masks and machines for OSA.   Anxiety: - Provides group, verbal and written instruction on the correlation between heart/lung disease and anxiety, treatment options, and management of anxiety.   Relaxation: - Provides group, verbal and written instruction about the benefits of relaxation for patients with heart/lung disease. Also provides patients with examples of relaxation techniques.      Most Recent Value   Date  05/30/14   Educator  Lucianne Lei LCSW   Instruction Review Code  2- Meets goals/outcomes      Knowledge Questionnaire Score:   Personal Goals and Risk Factors at Admission:     Personal Goals and Risk Factors at Admission - 03/27/14 0900    Personal Goals and Risk Factors on Admission   Increase Aerobic Exercise and Physical Activity Yes   Intervention While in program, learn and follow the exercise prescription taught. Start at a low level workload and increase workload after able to maintain previous level for 30 minutes. Increase time before increasing intensity.   Understand more about Heart/Pulmonary Disease. Yes   Intervention While in program utilize professionals for any questions, and attend the education sessions. Great websites to use are www.americanheart.org or www.lung.org for reliable information.   Improve shortness of breath with ADL's Yes    Intervention While in program, learn and follow the exercise prescription taught. Start at a low level workload and increase workload ad advised by the exercise physiologist. Increase time before increasing intensity.   Develop more efficient breathing techniques such as purse lipped breathing and diaphragmatic breathing; and practicing self-pacing with activity Yes   Intervention While in program, learn and utilize the specific breathing techniques taught to you. Continue to practice and use the techniques as needed.   Increase knowledge of respiratory medications and ability to use respiratory devices properly.  Yes   Intervention While in program, learn to administer MDI, nebulizer, and spacer properly.;Learn to take respiratory medicine as ordered.;While in program, learn to Clean MDI, nebulizers, and spacers properly.   Hypertension Yes   Goal Participant will see blood pressure controlled within the values of 140/5m/Hg or within value directed by their physician.   Intervention Provide nutrition & aerobic exercise along with prescribed medications to achieve BP 140/90 or less.      Personal Goals and Risk Factors Review:      Goals and Risk Factor Review      05/30/14 1536 06/20/14 1107 06/25/14 1544       Increase Aerobic Exercise and Physical Activity   Goals Progress/Improvement seen  Yes Yes Yes     Comments AAnilawas increasing to Level  5 on the  NS, however this level proved too difficult for her to maintain a high enough speed for her to get an aerobic workout and related benefits. Instead of increasing her level, we are having her focus on keeping her Watt  level  high in order for her to get a quality aerobic workout. She will focus on keeping her watts above 40.  Is continuing to progress on all the equipment on a weekly baisis. Can walk on the treadmill now with a lot less SOB Ms Hartl is nearing the end of [facility] and discussed with her her discharge plans for exercise. 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     Understand more about Heart/Pulmonary Disease   Goals Progress/Improvement seen   Yes      Comments  Has enjoyed medication education lecture the most so far.       Improve shortness of breath with ADL's   Goals Progress/Improvement seen   Yes      Comments  Has less SOB when walking on the treadmill and when doing her day to day activities      Breathing Techniques   Goals Progress/Improvement seen   Yes      Increase knowledge of respiratory medications   Goals Progress/Improvement seen   Yes      Comments  Has three different inhalers and is more comfortable knowing the differences between the three and how each one helps her.       Hypertension   Progress seen toward goals  Yes      Comments  Post exercise blood pressures have all been <140/<90         Personal Goals Discharge:      Personal Goals at Discharge - 07/18/14 0742    Increase Aerobic Exercise and Physical Activity   Goals Progress/Improvement seen  Yes   Comments Ms Hefter has increased her goals on the NS, TM, and RB; increased weights from 1lb to 2lbs. She plan to continue her exercise at [employer] 2 days/week.   Understand more about Heart/Pulmonary Disease   Goals Progress/Improvement seen  Yes   Comments Ms Knoche added [facility] education session and states she has learned good information for daily living.   Improve shortness of breath with ADL's   Goals Progress/Improvement seen  Yes   Comments Ms [facility] UCSD Questionnaire has improved by 40 points. Minimal Importance Difference is 5 points.   Breathing Techniques   Goals Progress/Improvement seen  Yes  Using PLB with exercise and activites at home.   Increase knowledge of respiratory medications   Goals Progress/Improvement seen  Yes   Comments Ms Folden has a good understanding of her ProAir, Symbicort, and spacer; also good understanding of her SVN albuterol.      Comments: Ms Larios has completed  [facility] and will continue exercise in [employer].

## 2014-07-18 NOTE — Patient Instructions (Signed)
pul Discharge Instructions  Patient Details  Name: Mackenzie Hill MRN: [id number] Date of Birth: [birth year] Referring Provider:  Dr Wallene Huh   Number of Visits: 36 Reason for Discharge:  Patient reached a stable level of exercise. Patient independent in their exercise.  Smoking History:  History  Smoking status   Former Smoker -- 0.50 packs/day for 3 years   Types: Cigarettes   Start date: 01/12/1962   Quit date: 01/12/1965  Smokeless tobacco   Not on file    Diagnosis:  Sarcoidosis  Initial Exercise Prescription:   Discharge Exercise Prescription (Final Exercise Prescription Changes):     Exercise Prescription Changes - 07/18/14 0700    Exercise Review   Progression Yes   Response to Exercise   Blood Pressure (Admit) 138/74 mmHg  Date 07/11/14 35th session of [facility]   Blood Pressure (Exercise) 160/80 mmHg   Blood Pressure (Exit) 128/80 mmHg   Heart Rate (Admit) 73 bpm   Heart Rate (Exercise) 81 bpm   Heart Rate (Exit) 62 bpm   Oxygen Saturation (Admit) 100 %   Oxygen Saturation (Exercise) 100 %   Oxygen Saturation (Exit) 100 %   Rating of Perceived Exertion (Exercise) 11   Perceived Dyspnea (Exercise) 2   Frequency Add 2 additional days to program exercise sessions.   Duration Progress to 50 minutes of aerobic without signs/symptoms of physical distress   Intensity Rest + 30   Progression Continue progressive overload as per policy without signs/symptoms or physical distress.   Resistance Training   Training Prescription Yes   Weight 2   Reps 10-12   Treadmill   MPH 1.6   Grade 2   Minutes 15   Recumbant Bike   Level 6   RPM 50   Minutes 15   NuStep   Level 5   Watts 50   Minutes 15      Functional Capacity:     6 Minute Walk      03/27/14 0900 05/04/14 1000 07/04/14 1141   6 Minute Walk   Phase Initial Mid Program Discharge   Distance 940 feet 900 feet 930 feet   Walk Time 6 minutes 6 minutes 6 minutes   Resting HR 60 bpm  65 bpm 60 bpm   Resting BP 122/68 mmHg 128/70 mmHg 130/78 mmHg   Max Ex. HR 67 bpm 72 bpm 57 bpm   Max Ex. BP 148/70 mmHg 134/68 mmHg 140/64 mmHg   RPE 12 13 11    Perceived Dyspnea  3 2 2    Symptoms   No      Quality of Life:   GD04 Depression Hill Results      Pre10   Post10  SF36                                                                   Pre           Post             Physical Fx     11        10                Role Fx: Physical    4  4  Bodily Pain     11  9    General Health    12  8   Vitality                 13      14    Social Fx     7  6   Role Fx: Emotional    3  3   Mental Health    22  18      Personal Goals: Goals established at orientation with interventions provided to work toward goal.     Personal Goals and Risk Factors at Admission - 03/27/14 0900    Personal Goals and Risk Factors on Admission   Increase Aerobic Exercise and Physical Activity Yes   Intervention While in program, learn and follow the exercise prescription taught. Start at a low level workload and increase workload after able to maintain previous level for 30 minutes. Increase time before increasing intensity.   Understand more about Heart/Pulmonary Disease. Yes   Intervention While in program utilize professionals for any questions, and attend the education sessions. Great websites to use are www.americanheart.org or www.lung.org for reliable information.   Improve shortness of breath with ADL's Yes   Intervention While in program, learn and follow the exercise prescription taught. Start at a low level workload and increase workload ad advised by the exercise physiologist. Increase time before increasing intensity.   Develop more efficient breathing techniques such as purse lipped breathing and diaphragmatic breathing; and practicing self-pacing with activity Yes   Intervention While in program, learn and utilize the specific breathing techniques taught to  you. Continue to practice and use the techniques as needed.   Increase knowledge of respiratory medications and ability to use respiratory devices properly.  Yes   Intervention While in program, learn to administer MDI, nebulizer, and spacer properly.;Learn to take respiratory medicine as ordered.;While in program, learn to Clean MDI, nebulizers, and spacers properly.   Hypertension Yes   Goal Participant will see blood pressure controlled within the values of 140/58mm/Hg or within value directed by their physician.   Intervention Provide nutrition & aerobic exercise along with prescribed medications to achieve BP 140/90 or less.       Personal Goals Discharge:     Personal Goals at Discharge - 07/18/14 0742    Increase Aerobic Exercise and Physical Activity   Goals Progress/Improvement seen  Yes   Comments Mackenzie Hill has increased her goals on the NS, TM, and RB; increased weights from 1lb to 2lbs. She plan to continue her exercise at [employer] 2 days/week.   Understand more about Heart/Pulmonary Disease   Goals Progress/Improvement seen  Yes   Comments Mackenzie Hill added [facility] education session and states she has learned good information for daily living.   Improve shortness of breath with ADL's   Goals Progress/Improvement seen  Yes   Comments Mackenzie Hill has improved by 40 points. Minimal Importance Difference is 5 points.   Breathing Techniques   Goals Progress/Improvement seen  Yes  Using PLB with exercise and activites at home.   Increase knowledge of respiratory medications   Goals Progress/Improvement seen  Yes   Comments Mackenzie Hill has a good understanding of her ProAir, Symbicort, and spacer; also good understanding of her SVN albuterol.      Nutrition & Weight - Outcomes:    Nutrition:   Nutrition Discharge:   Education Hill Score:   Goals reviewed with patient; copy given  to patient.

## 2014-07-18 NOTE — Progress Notes (Signed)
Pulmonary Individual Treatment Plan  Patient Details  Name: Mackenzie Hill MRN: [id number] Date of Birth: [birth year] Referring Provider:  Dr Wallene Huh  Initial Encounter Date: 03/27/14  Visit Diagnosis: Sarcoidosis  Patient's Home Medications on Admission:  Current outpatient prescriptions:    albuterol (PROVENTIL) (2.5 MG/3ML) 0.083% nebulizer solution, Inhale into the lungs., Disp: , Rfl:    albuterol (VENTOLIN HFA) 108 (90 BASE) MCG/ACT inhaler, Inhale into the lungs., Disp: , Rfl:    amLODipine (NORVASC) 5 MG tablet, Take 1 tablet by mouth daily., Disp: , Rfl:    brimonidine (ALPHAGAN) 0.15 % ophthalmic solution, , Disp: , Rfl:    budesonide-formoterol (SYMBICORT) 160-4.5 MCG/ACT inhaler, Inhale 2 puffs into the lungs 2 (two) times daily., Disp: , Rfl:    Calcium Carbonate-Vitamin D 600-400 MG-UNIT per tablet, Take 2 tablets by mouth daily., Disp: , Rfl:    carvedilol (COREG) 12.5 MG tablet, Take 12.5 mg by mouth 2 (two) times daily with a meal., Disp: , Rfl:    Cyanocobalamin (RA VITAMIN B-12 TR) 1000 MCG TBCR, Take 1 tablet by mouth daily., Disp: , Rfl:    fexofenadine (ALLEGRA) 180 MG tablet, Take 180 mg by mouth daily., Disp: , Rfl:    furosemide (LASIX) 20 MG tablet, Take 1 tablet by mouth 2 (two) times daily., Disp: , Rfl:    glipiZIDE (GLUCOTROL XL) 10 MG 24 hr tablet, Take 10 mg by mouth 2 (two) times daily., Disp: , Rfl:    isosorbide mononitrate (IMDUR) 60 MG 24 hr tablet, Take 3 tablets by mouth daily., Disp: , Rfl:    losartan (COZAAR) 50 MG tablet, Take 2 tablets by mouth daily., Disp: , Rfl:    metFORMIN (GLUCOPHAGE) 1000 MG tablet, Take 500 mg by mouth 2 (two) times daily with a meal. , Disp: , Rfl:    mometasone (NASONEX) 50 MCG/ACT nasal spray, Place 2 sprays into the nose daily as needed., Disp: , Rfl:    montelukast (SINGULAIR) 10 MG tablet, Take 10 mg by mouth daily., Disp: , Rfl:    sitaGLIPtin (JANUVIA) 50 MG tablet, Take by mouth daily.,  Disp: , Rfl:    spironolactone (ALDACTONE) 25 MG tablet, Take 25 mg by mouth daily., Disp: , Rfl:    vitamin C (ASCORBIC ACID) 500 MG tablet, Take by mouth., Disp: , Rfl:   Past Medical History: Past Medical History  Diagnosis Date   Cataract    CHF (congestive heart failure)    COPD (chronic obstructive pulmonary disease)    Diabetes mellitus without complication    Sarcoidosis, lung     reported by pt   Hypertension    Gout    Arthritis    Sleep apnea     Tobacco Use: History  Smoking status   Former Smoker -- 0.50 packs/day for 3 years   Types: Cigarettes   Start date: 01/12/1962   Quit date: 01/12/1965  Smokeless tobacco   Not on file    Labs:     Recent Review Flowsheet Data    There is no flowsheet data to display.       ADL UCSD:     ADL UCSD      03/27/14 0900 05/07/14 1000 07/02/14 1000   ADL UCSD   ADL Phase Entry Mid Exit   SOB Score total 93 78 53   Rest 0 3 0   Walk _0 Stairs _1 Bath _2 Dress 4 3  2   Shop _0 Pulmonary Function Assessment:   Exercise Target Goals:    Exercise Program Goal: Individual exercise prescription set with THRR, safety & activity barriers. Participant demonstrates ability to understand and report RPE using BORG scale, to self-measure pulse accurately, and to acknowledge the importance of the exercise prescription.  Exercise Prescription Goal: Starting with aerobic activity 30 plus minutes a day, 3 days per week for initial exercise prescription. Provide home exercise prescription and guidelines that participant acknowledges understanding prior to discharge.  Activity Barriers & Risk Stratification:   6 Minute Walk:     6 Minute Walk      03/27/14 0900 05/04/14 1000 07/04/14 1141   6 Minute Walk   Phase Initial Mid Program Discharge   Distance 940 feet 900 feet 930 feet   Walk Time 6 minutes 6 minutes 6 minutes   Resting HR 60 bpm 65 bpm 60 bpm   Resting BP  122/68 mmHg 128/70 mmHg 130/78 mmHg   Max Ex. HR 67 bpm 72 bpm 57 bpm   Max Ex. BP 148/70 mmHg 134/68 mmHg 140/64 mmHg   RPE _1 Perceived Dyspnea  _2 Symptoms   No      Initial Exercise Prescription:   Exercise Prescription Changes:      Exercise Prescription Changes      05/16/14 1100 05/23/14 1100 05/28/14 1000 06/04/14 1400 06/06/14 1000   Exercise Review   Progression Yes   Yes Yes   Response to Exercise   Blood Pressure (Admit)    120/78 mmHg    Blood Pressure (Exercise)    140/72 mmHg    Blood Pressure (Exit)    114/60 mmHg    Heart Rate (Admit)    63 bpm    Heart Rate (Exercise)    87 bpm    Heart Rate (Exit)    60 bpm    Oxygen Saturation (Admit)    100 %    Oxygen Saturation (Exercise)    99 %    Oxygen Saturation (Exit)    100 %    Rating of Perceived Exertion (Exercise)    11.5    Perceived Dyspnea (Exercise)    3    Resistance Training   Training Prescription    Yes    Weight    2    Reps    10-12    Treadmill   MPH  1.6  1.6    Grade  0  1    Minutes    15    Recumbant Bike   Level    4 5   RPM    50 50   Minutes    15 15   NuStep   Level 5  -- 5    Watts 55  -- 65    Minutes    15    REL-XR   Level   --     Watts   --       06/20/14 1100 06/26/14 0600 06/27/14 1500 07/18/14 0700     Exercise Review   Progression  Yes Yes Yes    Response to Exercise   Blood Pressure (Admit)  120/78 mmHg 120/70 mmHg 138/74 mmHg  Date 07/11/14 35th session of [facility]    Blood Pressure (Exercise)  144/80 mmHg 142/72 mmHg 160/80 mmHg    Blood Pressure (Exit)  114/70 mmHg 106/74 mmHg 128/80 mmHg  Heart Rate (Admit)  64 bpm  64 58 bpm 73 bpm    Heart Rate (Exercise)  90 bpm 91 bpm 81 bpm    Heart Rate (Exit)  71 bpm 66 bpm 62 bpm    Oxygen Saturation (Admit)  99 % 10099 % 100 %    Oxygen Saturation (Exercise)  99 % 97 % 100 %    Oxygen Saturation (Exit)  100 % 12 % 100 %    Rating of Perceived Exertion (Exercise)  _0 Perceived Dyspnea  (Exercise)  2  2    Frequency    Add 2 additional days to program exercise sessions.    Duration    Progress to 50 minutes of aerobic without signs/symptoms of physical distress    Intensity    Rest + 30    Progression    Continue progressive overload as per policy without signs/symptoms or physical distress.    Resistance Training   Training Prescription Yes   Yes    Weight 2   2    Reps 10-12   10-12    Treadmill   MPH 1.6   1.6    Grade 1   2    Minutes 15   15    Recumbant Bike   Level 6   6    RPM 50   50    Minutes 15   15    NuStep   Level 5   5    Watts 65   50    Minutes 15   15       Discharge Exercise Prescription (Final Exercise Prescription Changes):     Exercise Prescription Changes - 07/18/14 0700    Exercise Review   Progression Yes   Response to Exercise   Blood Pressure (Admit) 138/74 mmHg  Date 07/11/14 35th session of [facility]   Blood Pressure (Exercise) 160/80 mmHg   Blood Pressure (Exit) 128/80 mmHg   Heart Rate (Admit) 73 bpm   Heart Rate (Exercise) 81 bpm   Heart Rate (Exit) 62 bpm   Oxygen Saturation (Admit) 100 %   Oxygen Saturation (Exercise) 100 %   Oxygen Saturation (Exit) 100 %   Rating of Perceived Exertion (Exercise) 11   Perceived Dyspnea (Exercise) 2   Frequency Add 2 additional days to program exercise sessions.   Duration Progress to 50 minutes of aerobic without signs/symptoms of physical distress   Intensity Rest + 30   Progression Continue progressive overload as per policy without signs/symptoms or physical distress.   Resistance Training   Training Prescription Yes   Weight 2   Reps 10-12   Treadmill   MPH 1.6   Grade 2   Minutes 15   Recumbant Bike   Level 6   RPM 50   Minutes 15   NuStep   Level 5   Watts 50   Minutes 15       Nutrition:  Target Goals: Understanding of nutrition guidelines, daily intake of sodium <1538m, cholesterol <2070m calories 30% from fat and 7% or less from saturated fats, daily to  have 5 or more servings of fruits and vegetables.  Biometrics:    Nutrition Therapy Plan and Nutrition Goals:   Nutrition Discharge: Rate Your Plate Scores:   Psychosocial: Target Goals: Acknowledge presence or absence of depression, maximize coping skills, provide positive support system. Participant is able to verbalize types and ability to use techniques and skills needed for reducing stress and  depression.  Initial Review & Psychosocial Screening:     GD04 Depression Questionnaire Results      Pre10   Post10  SF36                                                                   Pre           Post             Physical Fx     11        10                Role Fx: Physical    4  4              Bodily Pain     11  9    General Health    12  8   Vitality                 13      14    Social Fx     7  6   Role Fx: Emotional    3  3   Mental Health    22  18    PHQ-9:     Recent Review Flowsheet Data    Depression screen [facility] 2/9 07/02/2014 05/07/2014 05/01/2014   Decreased Interest 0 1 0   Down, Depressed, Hopeless 0 1 0   PHQ - 2 Score 0 2 0   Altered sleeping - 1 -   Tired, decreased energy - 1 -   Change in appetite - 1 -   Feeling bad or failure about yourself  - 0 -   Trouble concentrating - 0 -   Moving slowly or fidgety/restless - 0 -   Suicidal thoughts - 0 -   PHQ-9 Score - 5 -   Difficult doing work/chores - Not difficult at all -      Psychosocial Evaluation and Intervention:   Psychosocial Re-Evaluation:     Psychosocial Re-Evaluation      07/11/14 1054           Psychosocial Re-Evaluation   Comments Counselor met with Ms. Rivers today for follow up evaluation.  She reports progress made in this program so far as breathing better, legs are stronger; and she is able to work out in the yard now and mow which was not possible when she began initially.  Her appetite has decreased; which she reports is probably a "good thing" and she continues to  sleep well.  Ms. Fromer states she has benefitted as well from the educational components of this program; especially the nutrition education.  She will maintain her progress as a participant in a [employer] program she has joined.           Education: Education Goals: Education classes will be provided on a weekly basis, covering required topics. Participant will state understanding/return demonstration of topics presented.  Learning Barriers/Preferences:   Education Topics: Initial Evaluation Education: - Verbal, written and demonstration of respiratory meds, RPE/PD scales, oximetry and breathing techniques. Instruction on use of nebulizers and MDIs: cleaning and proper use, rinsing mouth with steroid doses and importance of monitoring MDI activations.   General Nutrition Guidelines/Fats and Fiber: -  Group instruction provided by verbal, written material, models and posters to present the general guidelines for heart healthy nutrition. Gives an explanation and review of dietary fats and fiber.   Controlling Sodium/Reading Food Labels: -Group verbal and written material supporting the discussion of sodium use in heart healthy nutrition. Review and explanation with models, verbal and written materials for utilization of the food label.   Exercise Physiology & Risk Factors: - Group verbal and written instruction with models to review the exercise physiology of the cardiovascular system and associated critical values. Details cardiovascular disease risk factors and the goals associated with each risk factor.   Aerobic Exercise & Resistance Training: - Gives group verbal and written discussion on the health impact of inactivity. On the components of aerobic and resistive training programs and the benefits of this training and how to safely progress through these programs.   Flexibility, Balance, General Exercise Guidelines: - Provides group verbal and written instruction on the  benefits of flexibility and balance training programs. Provides general exercise guidelines with specific guidelines to those with heart or lung disease. Demonstration and skill practice provided.   Stress Management: - Provides group verbal and written instruction about the health risks of elevated stress, cause of high stress, and healthy ways to reduce stress.   Depression: - Provides group verbal and written instruction on the correlation between heart/lung disease and depressed mood, treatment options, and the stigmas associated with seeking treatment.   Exercise & Equipment Safety: - Individual verbal instruction and demonstration of equipment use and safety with use of the equipment.   Infection Prevention: - Provides verbal and written material to individual with discussion of infection control including proper hand washing and proper equipment cleaning during exercise session.   Falls Prevention: - Provides verbal and written material to individual with discussion of falls prevention and safety.   Diabetes: - Individual verbal and written instruction to review signs/symptoms of diabetes, desired ranges of glucose level fasting, after meals and with exercise. Advice that pre and post exercise glucose checks will be done for 3 sessions at entry of program.   Chronic Lung Diseases: - Group verbal and written instruction to review new updates, new respiratory medications, new advancements in procedures and treatments. Provide informative websites and "800" numbers of self-education.   Lung Procedures: - Group verbal and written instruction to describe testing methods done to diagnose lung disease. Review the outcome of test results. Describe the treatment choices: Pulmonary Function Tests, ABGs and oximetry.   Energy Conservation: - Provide group verbal and written instruction for methods to conserve energy, plan and organize activities. Instruct on pacing techniques, use of  adaptive equipment and posture/positioning to relieve shortness of breath.   Triggers: - Group verbal and written instruction to review types of environmental controls: home humidity, furnaces, filters, dust mite/pet prevention, HEPA vacuums. To discuss weather changes, air quality and the benefits of nasal washing.   Exacerbations: - Group verbal and written instruction to provide: warning signs, infection symptoms, calling MD promptly, preventive modes, and value of vaccinations. Review: effective airway clearance, coughing and/or vibration techniques. Create an Sports administrator.   Oxygen: - Individual and group verbal and written instruction on oxygen therapy. Includes supplement oxygen, available portable oxygen systems, continuous and intermittent flow rates, oxygen safety, concentrators, and Medicare reimbursement for oxygen.   Respiratory Medications: - Group verbal and written instruction to review medications for lung disease. Drug class, frequency, complications, importance of spacers, rinsing mouth after steroid MDI's, and proper cleaning  methods for nebulizers.   AED/CPR: - Group verbal and written instruction with the use of models to demonstrate the basic use of the AED with the basic ABC's of resuscitation.   Breathing Retraining: - Provides individuals verbal and written instruction on purpose, frequency, and proper technique of diaphragmatic breathing and pursed-lipped breathing. Applies individual practice skills.   Anatomy and Physiology of the Lungs: - Group verbal and written instruction with the use of models to provide basic lung anatomy and physiology related to function, structure and complications of lung disease.   Heart Failure: - Group verbal and written instruction on the basics of heart failure: signs/symptoms, treatments, explanation of ejection fraction, enlarged heart and cardiomyopathy.   Sleep Apnea: - Individual verbal and written instruction to  review Obstructive Sleep Apnea. Review of risk factors, methods for diagnosing and types of masks and machines for OSA.   Anxiety: - Provides group, verbal and written instruction on the correlation between heart/lung disease and anxiety, treatment options, and management of anxiety.   Relaxation: - Provides group, verbal and written instruction about the benefits of relaxation for patients with heart/lung disease. Also provides patients with examples of relaxation techniques.   Knowledge Questionnaire Score:   Personal Goals and Risk Factors at Admission:     Personal Goals and Risk Factors at Admission - 03/27/14 0900    Personal Goals and Risk Factors on Admission   Increase Aerobic Exercise and Physical Activity Yes   Intervention While in program, learn and follow the exercise prescription taught. Start at a low level workload and increase workload after able to maintain previous level for 30 minutes. Increase time before increasing intensity.   Understand more about Heart/Pulmonary Disease. Yes   Intervention While in program utilize professionals for any questions, and attend the education sessions. Great websites to use are www.americanheart.org or www.lung.org for reliable information.   Improve shortness of breath with ADL's Yes   Intervention While in program, learn and follow the exercise prescription taught. Start at a low level workload and increase workload ad advised by the exercise physiologist. Increase time before increasing intensity.   Develop more efficient breathing techniques such as purse lipped breathing and diaphragmatic breathing; and practicing self-pacing with activity Yes   Intervention While in program, learn and utilize the specific breathing techniques taught to you. Continue to practice and use the techniques as needed.   Increase knowledge of respiratory medications and ability to use respiratory devices properly.  Yes   Intervention While in program,  learn to administer MDI, nebulizer, and spacer properly.;Learn to take respiratory medicine as ordered.;While in program, learn to Clean MDI, nebulizers, and spacers properly.   Hypertension Yes   Goal Participant will see blood pressure controlled within the values of 140/60m/Hg or within value directed by their physician.   Intervention Provide nutrition & aerobic exercise along with prescribed medications to achieve BP 140/90 or less.      Personal Goals and Risk Factors Review:      Goals and Risk Factor Review      05/30/14 1536 06/20/14 1107 06/25/14 1544       Increase Aerobic Exercise and Physical Activity   Goals Progress/Improvement seen  Yes Yes Yes     Comments ANeviawas increasing to Level 5 on the  NS, however this level proved too difficult for her to maintain a high enough speed for her to get an aerobic workout and related benefits. Instead of increasing her level, we are having her focus  on keeping her Francena Hanly  level  high in order for her to get a quality aerobic workout. She will focus on keeping her watts above 40.  Is continuing to progress on all the equipment on a weekly baisis. Can walk on the treadmill now with a lot less SOB Ms Moffat is nearing the end of [facility] and discussed with her her discharge plans for exercise. 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     Understand more about Heart/Pulmonary Disease   Goals Progress/Improvement seen   Yes      Comments  Has enjoyed medication education lecture the most so far.       Improve shortness of breath with ADL's   Goals Progress/Improvement seen   Yes      Comments  Has less SOB when walking on the treadmill and when doing her day to day activities      Breathing Techniques   Goals Progress/Improvement seen   Yes      Increase knowledge of respiratory medications   Goals Progress/Improvement seen   Yes      Comments  Has three different inhalers and is more comfortable knowing the differences between  the three and how each one helps her.       Hypertension   Progress seen toward goals  Yes      Comments  Post exercise blood pressures have all been <140/<90         Personal Goals Discharge:      Personal Goals at Discharge - 07/18/14 0742    Increase Aerobic Exercise and Physical Activity   Goals Progress/Improvement seen  Yes   Comments Ms Forni has increased her goals on the NS, TM, and RB; increased weights from 1lb to 2lbs. She plan to continue her exercise at [employer] 2 days/week.   Understand more about Heart/Pulmonary Disease   Goals Progress/Improvement seen  Yes   Comments Ms Erker added [facility] education session and states she has learned good information for daily living.   Improve shortness of breath with ADL's   Goals Progress/Improvement seen  Yes   Comments Ms [facility] UCSD Questionnaire has improved by 40 points. Minimal Importance Difference is 5 points.   Breathing Techniques   Goals Progress/Improvement seen  Yes  Using PLB with exercise and activites at home.   Increase knowledge of respiratory medications   Goals Progress/Improvement seen  Yes   Comments Ms Strauss has a good understanding of her ProAir, Symbicort, and spacer; also good understanding of her SVN albuterol.      Comments: Thank you for the opportunity to work for your

## 2014-07-19 ENCOUNTER — Encounter: Payer: Commercial Managed Care - HMO | Admitting: Dietician

## 2014-07-19 VITALS — BP 130/58 | Ht 65.0 in | Wt 231.6 lb

## 2014-07-19 DIAGNOSIS — E119 Type 2 diabetes mellitus without complications: Secondary | ICD-10-CM

## 2014-07-19 DIAGNOSIS — D869 Sarcoidosis, unspecified: Secondary | ICD-10-CM | POA: Diagnosis not present

## 2014-07-19 NOTE — Patient Instructions (Signed)
   Eat either cereal or toast for breakfast, but not both. Do include a protein food: [place] sausage, lowfat cheese, nuts, or peanut butter.  Include generous portions of vegetables as well as some protein with lunch and supper meals to help control hunger. For example, add nuts, beans, chicken, or tuna to a salad along with fruit and whole grain bread or crackers.   If you continue to feel hungry or blood sugars drop too low, check with nurse/ doctor about reducing the Glipizide medication.

## 2014-07-19 NOTE — Progress Notes (Signed)
   Reviewed basic meal planning using plate method and food lists, and provided 1500kcal meal plan for gradual weight loss.  Patient's food diary indicates some high-carb meals (eating cereal and 2 slices toast with breakfast), so discussed controlling carb intake.  Patient reports feeling hungry and lightheaded when reducing her food intake -- advised her to include adequate protein and vegetables with meals, and to discuss possibility of reducing dose of Glipizide with PCP.  Reviewed treatment for hypoglycemia.  Advised pt to analyze BG results to see patterns in BGs and to determine causes of high or low BGs.   Education materials given:  Planning A [employer] and meal plan  Quick and Healthy Meal Ideas  Goals/ instructions

## 2014-07-25 DIAGNOSIS — N183 Chronic kidney disease, stage 3 (moderate): Secondary | ICD-10-CM | POA: Diagnosis not present

## 2014-07-27 ENCOUNTER — Other Ambulatory Visit: Payer: Self-pay | Admitting: *Deleted

## 2014-07-30 NOTE — Patient Outreach (Signed)
[facility]) Care Management   07/27/14  Mackenzie Hill 1940-07-09 [id number]  Mackenzie Hill is an 74 y.o. female  Subjective: "I had to have a kidney ultra-sound and I am worried about how to care for my kidneys" "After going to the diabetic classes I realized I am eating too many carbs for breakfast, but I am not sure what else I can eat?"  Objective:Blood pressure 134/72, pulse 56, resp. rate 16, height 1.651 m (5\' 5" ), weight 226 lb (102.513 kg), SpO2 97 %.  Review of Systems  All other systems reviewed and are negative.   Physical Exam  Constitutional: She is oriented to person, place, and time. She appears well-developed and well-nourished.  Cardiovascular: Regular rhythm.  Bradycardia present.   Pulses:      Dorsalis pedis pulses are 2+ on the right side, and 2+ on the left side.  Respiratory: Effort normal and breath sounds normal.  GI: Soft. Bowel sounds are normal.  Musculoskeletal: Normal range of motion.  Neurological: She is alert and oriented to person, place, and time.  Skin: Skin is warm, dry and intact.  Bilateral feet with skin intact, +pedal pulses bilat, negative for edema     Current Medications: Medications reviewed and are congruent to what the pt has present in the home.  Current Outpatient Prescriptions  Medication Sig Dispense Refill  . albuterol (PROVENTIL) (2.5 MG/3ML) 0.083% nebulizer solution Inhale into the lungs.    Marland Kitchen albuterol (VENTOLIN HFA) 108 (90 BASE) MCG/ACT inhaler Inhale into the lungs.    Marland Kitchen amLODipine (NORVASC) 5 MG tablet Take 1 tablet by mouth daily.    . brimonidine (ALPHAGAN) 0.15 % ophthalmic solution     . budesonide-formoterol (SYMBICORT) 160-4.5 MCG/ACT inhaler Inhale 2 puffs into the lungs 2 (two) times daily.    . Calcium Carbonate-Vitamin D 600-400 MG-UNIT per tablet Take 2 tablets by mouth daily.    . carvedilol (COREG) 12.5 MG tablet Take 12.5 mg by mouth 2 (two) times daily with a meal.    . Cyanocobalamin  (RA VITAMIN B-12 TR) 1000 MCG TBCR Take 1 tablet by mouth daily.    . fexofenadine (ALLEGRA) 180 MG tablet Take 180 mg by mouth daily.    . furosemide (LASIX) 20 MG tablet Take 1 tablet by mouth 2 (two) times daily.    Marland Kitchen glipiZIDE (GLUCOTROL XL) 10 MG 24 hr tablet Take 10 mg by mouth 2 (two) times daily.    . isosorbide mononitrate (IMDUR) 60 MG 24 hr tablet Take 3 tablets by mouth daily.    Marland Kitchen losartan (COZAAR) 50 MG tablet Take 2 tablets by mouth daily.    . metFORMIN (GLUCOPHAGE) 1000 MG tablet Take 500 mg by mouth 2 (two) times daily with a meal.     . mometasone (NASONEX) 50 MCG/ACT nasal spray Place 2 sprays into the nose daily as needed.    . montelukast (SINGULAIR) 10 MG tablet Take 10 mg by mouth daily.    . sitaGLIPtin (JANUVIA) 50 MG tablet Take by mouth daily.    Marland Kitchen spironolactone (ALDACTONE) 25 MG tablet Take 25 mg by mouth daily.    . vitamin C (ASCORBIC ACID) 500 MG tablet Take by mouth.     No current facility-administered medications for this visit.    Functional Status:   In your present state of health, do you have any difficulty performing the following activities: 05/01/2014  Hearing? N  Vision? N  Difficulty concentrating or making decisions? N  Walking  or climbing stairs? N  Dressing or bathing? N  Doing errands, shopping? N  Preparing Food and eating ? N  Using the Toilet? N  In the past six months, have you accidently leaked urine? N  Do you have problems with loss of bowel control? N  Managing your Medications? N  Managing your Finances? N  Housekeeping or managing your Housekeeping? N    Fall/Depression Screening:    PHQ 2/9 Scores 07/02/2014 05/07/2014 05/01/2014  PHQ - 2 Score 0 2 0  PHQ- 9 Score - 5 -    Assessment: See physical assessment as above. RNCM assisted pt in reviewing options for breakfast. RNCM and pt reviewed her weekly meals recorded for the dietitian to reinforce diabetic teaching done by [facility]. RNCM used the intranet to find  low carb breakfast options, RNCM wrote down two recipes for the pt to try. RNCM and pt discussed ways to care for her kidneys. Pt noted to be extremely concerned that a recent kidney ultrasound was going to have bad results although the MD stated it was for routine purposes. RNCM discussed with pt she was doing well and would benefit from possible health coach to continue working on diabetic eating plan. Pt requested RNCM come for a home visit one more time "just in case the kidney ultra-sound wasn't good." RNCM called kidney MD office to inquire about a patient portal information to assist pt in obtaining her own results, but was informed they do not give this type of information over the phone. Nurse stated pt could request this info. at her next visit.     Plan: RNCM will see pt in one month for d/c visit.  RNCM will print and mail pt information on caring for her kidneys, per pt request.   Kwame Ryland RN, BSN  [facility] Care Management [phone])

## 2014-08-01 ENCOUNTER — Encounter: Payer: Self-pay | Admitting: *Deleted

## 2014-08-01 NOTE — Progress Notes (Signed)
Discharge Summary  Patient Details  Name: Mackenzie Hill MRN: [id number] Date of Birth: May 12, 1940 Referring Provider:  No ref. provider found   Number of Visits: 36  Reason for Discharge:  Patient reached a stable level of exercise. Patient independent in their exercise.  Smoking History:  History  Smoking status  . Former Smoker -- 0.50 packs/day for 3 years  . Types: Cigarettes  . Start date: 01/12/1962  . Quit date: 01/12/1965  Smokeless tobacco  . Not on file    Diagnosis:  No diagnosis found.  ADL UCSD:   Initial Exercise Prescription:   Discharge Exercise Prescription (Final Exercise Prescription Changes):     Exercise Prescription Changes - 07/18/14 0700    Exercise Review   Progression Yes   Response to Exercise   Blood Pressure (Admit) 138/74 mmHg  Date 07/11/14 35th session of [facility]   Blood Pressure (Exercise) 160/80 mmHg   Blood Pressure (Exit) 128/80 mmHg   Heart Rate (Admit) 73 bpm   Heart Rate (Exercise) 81 bpm   Heart Rate (Exit) 62 bpm   Oxygen Saturation (Admit) 100 %   Oxygen Saturation (Exercise) 100 %   Oxygen Saturation (Exit) 100 %   Rating of Perceived Exertion (Exercise) 11   Perceived Dyspnea (Exercise) 2   Frequency Add 2 additional days to program exercise sessions.   Duration Progress to 50 minutes of aerobic without signs/symptoms of physical distress   Intensity Rest + 30   Progression Continue progressive overload as per policy without signs/symptoms or physical distress.   Resistance Training   Training Prescription Yes   Weight 2   Reps 10-12   Treadmill   MPH 1.6   Grade 2   Minutes 15   Recumbant Bike   Level 6   RPM 50   Minutes 15   NuStep   Level 5   Watts 50   Minutes 15      Functional Capacity:   Psychological, QOL, Others - Outcomes: PHQ 2/9: Depression screen [facility] 2/9 07/02/2014 05/07/2014 05/01/2014  Decreased Interest 0 1 0  Down, Depressed, Hopeless 0 1 0  PHQ - 2 Score 0 2 0  Altered  sleeping - 1 -  Tired, decreased energy - 1 -  Change in appetite - 1 -  Feeling bad or failure about yourself  - 0 -  Trouble concentrating - 0 -  Moving slowly or fidgety/restless - 0 -  Suicidal thoughts - 0 -  PHQ-9 Score - 5 -  Difficult doing work/chores - Not difficult at all -    Quality of Life:   Personal Goals: Goals established at orientation with interventions provided to work toward goal.    Personal Goals Discharge:     Personal Goals at Discharge - 07/18/14 0742    Increase Aerobic Exercise and Physical Activity   Goals Progress/Improvement seen  Yes   Comments Ms Dahm has increased her goals on the NS, TM, and RB; increased weights from 1lb to 2lbs. She plan to continue her exercise at [employer] 2 days/week.   Understand more about Heart/Pulmonary Disease   Goals Progress/Improvement seen  Yes   Comments Ms Surti added [facility] education session and states she has learned good information for daily living.   Improve shortness of breath with ADL's   Goals Progress/Improvement seen  Yes   Comments Ms [facility] UCSD Questionnaire has improved by 40 points. Minimal Importance Difference is 5 points.   Breathing Techniques   Goals Progress/Improvement seen  Yes  Using PLB with exercise and activites at home.   Increase knowledge of respiratory medications   Goals Progress/Improvement seen  Yes   Comments Ms Norrington has a good understanding of her ProAir, Symbicort, and spacer; also good understanding of her SVN albuterol.      Nutrition & Weight - Outcomes:    Nutrition:   Nutrition Discharge:   Education Questionnaire Score:   Goals reviewed with patient; copy given to patient.

## 2014-08-06 ENCOUNTER — Other Ambulatory Visit: Payer: Self-pay | Admitting: *Deleted

## 2014-08-06 NOTE — Patient Outreach (Signed)
RNCM received a phone call from pt related to her having a problem knowing if her eye doctor was still covered under her insurance. Pt relayed that her PCP office had called her stating she needed to switch to [facility] center because her current eye doctor was not in the network. Pt stated she had always gone to Dr. Matilde Sprang and did not want to switch. Pt stated she requested a referral to Dr. Matilde Sprang. Pt stated she wanted to know if RNCM knew if there had been any switching around of her insurance with the eye doctor? RNCM let pt know she was unsure but would find out any information she could. Pt stated she had already contacted [employer] and was able to speak with a representative.   Plan: RNCM will call [facility] office for guidance on this issue.   Rutherford Limerick RN, BSN  [facility] Care Management [phone])

## 2014-08-10 DIAGNOSIS — I10 Essential (primary) hypertension: Secondary | ICD-10-CM | POA: Diagnosis not present

## 2014-08-10 DIAGNOSIS — N183 Chronic kidney disease, stage 3 (moderate): Secondary | ICD-10-CM | POA: Diagnosis not present

## 2014-08-10 DIAGNOSIS — I5032 Chronic diastolic (congestive) heart failure: Secondary | ICD-10-CM | POA: Diagnosis not present

## 2014-08-10 DIAGNOSIS — E119 Type 2 diabetes mellitus without complications: Secondary | ICD-10-CM | POA: Diagnosis not present

## 2014-08-10 DIAGNOSIS — D631 Anemia in chronic kidney disease: Secondary | ICD-10-CM | POA: Diagnosis not present

## 2014-08-21 DIAGNOSIS — I5032 Chronic diastolic (congestive) heart failure: Secondary | ICD-10-CM | POA: Diagnosis not present

## 2014-08-21 DIAGNOSIS — E119 Type 2 diabetes mellitus without complications: Secondary | ICD-10-CM | POA: Diagnosis not present

## 2014-08-21 DIAGNOSIS — D631 Anemia in chronic kidney disease: Secondary | ICD-10-CM | POA: Diagnosis not present

## 2014-08-21 DIAGNOSIS — N183 Chronic kidney disease, stage 3 (moderate): Secondary | ICD-10-CM | POA: Diagnosis not present

## 2014-08-21 DIAGNOSIS — I10 Essential (primary) hypertension: Secondary | ICD-10-CM | POA: Diagnosis not present

## 2014-08-24 ENCOUNTER — Ambulatory Visit: Payer: Commercial Managed Care - HMO | Admitting: *Deleted

## 2014-09-03 ENCOUNTER — Ambulatory Visit: Payer: Commercial Managed Care - HMO | Admitting: *Deleted

## 2014-09-04 DIAGNOSIS — H43812 Vitreous degeneration, left eye: Secondary | ICD-10-CM | POA: Diagnosis not present

## 2014-09-04 DIAGNOSIS — H4011X1 Primary open-angle glaucoma, mild stage: Secondary | ICD-10-CM | POA: Diagnosis not present

## 2014-09-04 DIAGNOSIS — H5211 Myopia, right eye: Secondary | ICD-10-CM | POA: Diagnosis not present

## 2014-09-04 DIAGNOSIS — H25813 Combined forms of age-related cataract, bilateral: Secondary | ICD-10-CM | POA: Diagnosis not present

## 2014-09-04 DIAGNOSIS — H524 Presbyopia: Secondary | ICD-10-CM | POA: Diagnosis not present

## 2014-09-04 DIAGNOSIS — I10 Essential (primary) hypertension: Secondary | ICD-10-CM | POA: Diagnosis not present

## 2014-09-04 DIAGNOSIS — H52221 Regular astigmatism, right eye: Secondary | ICD-10-CM | POA: Diagnosis not present

## 2014-09-04 DIAGNOSIS — E119 Type 2 diabetes mellitus without complications: Secondary | ICD-10-CM | POA: Diagnosis not present

## 2014-09-07 ENCOUNTER — Other Ambulatory Visit: Payer: Self-pay | Admitting: *Deleted
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
## 2014-09-10 ENCOUNTER — Encounter: Payer: Self-pay | Admitting: *Deleted

## 2014-09-13 ENCOUNTER — Encounter: Payer: Self-pay | Admitting: *Deleted

## 2014-09-13 NOTE — Patient Outreach (Signed)
[facility]) Care Management  09/13/2014  Mackenzie Hill May 28, 1940 [id number]   [facility] social work episode closed.   [name] [facility] Care Management [phone]

## 2014-09-18 NOTE — Patient Outreach (Signed)
[facility]) Care Management  09/18/2014  NUMA HEATWOLE 01-06-1941 [id number]   Notification from Boyce Minor, RN to close case due to goals met with [facility] Care Management .  Thanks, [name]. [place], [place] Assistant Phone: [phone] Fax: [phone]

## 2014-09-21 DIAGNOSIS — E118 Type 2 diabetes mellitus with unspecified complications: Secondary | ICD-10-CM | POA: Diagnosis not present

## 2014-09-21 DIAGNOSIS — I10 Essential (primary) hypertension: Secondary | ICD-10-CM | POA: Diagnosis not present

## 2014-09-21 DIAGNOSIS — E1165 Type 2 diabetes mellitus with hyperglycemia: Secondary | ICD-10-CM | POA: Diagnosis not present

## 2014-09-21 DIAGNOSIS — Z79899 Other long term (current) drug therapy: Secondary | ICD-10-CM | POA: Diagnosis not present

## 2014-09-28 DIAGNOSIS — I10 Essential (primary) hypertension: Secondary | ICD-10-CM | POA: Diagnosis not present

## 2014-09-28 DIAGNOSIS — D869 Sarcoidosis, unspecified: Secondary | ICD-10-CM | POA: Diagnosis not present

## 2014-09-28 DIAGNOSIS — D6489 Other specified anemias: Secondary | ICD-10-CM | POA: Diagnosis not present

## 2014-09-28 DIAGNOSIS — Z0000 Encounter for general adult medical examination without abnormal findings: Secondary | ICD-10-CM | POA: Diagnosis not present

## 2014-09-28 DIAGNOSIS — Z79899 Other long term (current) drug therapy: Secondary | ICD-10-CM | POA: Diagnosis not present

## 2014-09-28 DIAGNOSIS — R21 Rash and other nonspecific skin eruption: Secondary | ICD-10-CM | POA: Diagnosis not present

## 2014-09-28 DIAGNOSIS — G4733 Obstructive sleep apnea (adult) (pediatric): Secondary | ICD-10-CM | POA: Diagnosis not present

## 2014-09-28 DIAGNOSIS — E1122 Type 2 diabetes mellitus with diabetic chronic kidney disease: Secondary | ICD-10-CM | POA: Diagnosis not present

## 2014-10-19 DIAGNOSIS — Z23 Encounter for immunization: Secondary | ICD-10-CM | POA: Diagnosis not present

## 2014-12-11 DIAGNOSIS — G4733 Obstructive sleep apnea (adult) (pediatric): Secondary | ICD-10-CM | POA: Diagnosis not present

## 2015-01-25 DIAGNOSIS — N183 Chronic kidney disease, stage 3 (moderate): Secondary | ICD-10-CM | POA: Diagnosis not present

## 2015-01-25 DIAGNOSIS — E1122 Type 2 diabetes mellitus with diabetic chronic kidney disease: Secondary | ICD-10-CM | POA: Diagnosis not present

## 2015-01-25 DIAGNOSIS — I10 Essential (primary) hypertension: Secondary | ICD-10-CM | POA: Diagnosis not present

## 2015-01-25 DIAGNOSIS — Z13220 Encounter for screening for lipoid disorders: Secondary | ICD-10-CM | POA: Diagnosis not present

## 2015-01-25 DIAGNOSIS — Z79899 Other long term (current) drug therapy: Secondary | ICD-10-CM | POA: Diagnosis not present

## 2015-01-28 DIAGNOSIS — M109 Gout, unspecified: Secondary | ICD-10-CM | POA: Diagnosis not present

## 2015-01-28 DIAGNOSIS — N184 Chronic kidney disease, stage 4 (severe): Secondary | ICD-10-CM | POA: Diagnosis not present

## 2015-01-28 DIAGNOSIS — J431 Panlobular emphysema: Secondary | ICD-10-CM | POA: Diagnosis not present

## 2015-01-28 DIAGNOSIS — I10 Essential (primary) hypertension: Secondary | ICD-10-CM | POA: Diagnosis not present

## 2015-02-27 DIAGNOSIS — M2550 Pain in unspecified joint: Secondary | ICD-10-CM | POA: Diagnosis not present

## 2015-02-27 DIAGNOSIS — R200 Anesthesia of skin: Secondary | ICD-10-CM | POA: Diagnosis not present

## 2015-02-27 DIAGNOSIS — M1A9XX0 Chronic gout, unspecified, without tophus (tophi): Secondary | ICD-10-CM | POA: Diagnosis not present

## 2015-02-27 DIAGNOSIS — N184 Chronic kidney disease, stage 4 (severe): Secondary | ICD-10-CM | POA: Diagnosis not present

## 2015-02-27 DIAGNOSIS — M79675 Pain in left toe(s): Secondary | ICD-10-CM | POA: Insufficient documentation

## 2015-02-27 DIAGNOSIS — E1121 Type 2 diabetes mellitus with diabetic nephropathy: Secondary | ICD-10-CM | POA: Diagnosis not present

## 2015-03-11 DIAGNOSIS — Z1231 Encounter for screening mammogram for malignant neoplasm of breast: Secondary | ICD-10-CM | POA: Diagnosis not present

## 2015-03-28 DIAGNOSIS — I10 Essential (primary) hypertension: Secondary | ICD-10-CM | POA: Diagnosis not present

## 2015-03-28 DIAGNOSIS — H5211 Myopia, right eye: Secondary | ICD-10-CM | POA: Diagnosis not present

## 2015-03-28 DIAGNOSIS — E119 Type 2 diabetes mellitus without complications: Secondary | ICD-10-CM | POA: Diagnosis not present

## 2015-03-28 DIAGNOSIS — H401131 Primary open-angle glaucoma, bilateral, mild stage: Secondary | ICD-10-CM | POA: Diagnosis not present

## 2015-03-28 DIAGNOSIS — H52221 Regular astigmatism, right eye: Secondary | ICD-10-CM | POA: Diagnosis not present

## 2015-03-28 DIAGNOSIS — H43812 Vitreous degeneration, left eye: Secondary | ICD-10-CM | POA: Diagnosis not present

## 2015-03-28 DIAGNOSIS — H524 Presbyopia: Secondary | ICD-10-CM | POA: Diagnosis not present

## 2015-03-28 DIAGNOSIS — H25813 Combined forms of age-related cataract, bilateral: Secondary | ICD-10-CM | POA: Diagnosis not present

## 2015-04-10 DIAGNOSIS — M1A00X0 Idiopathic chronic gout, unspecified site, without tophus (tophi): Secondary | ICD-10-CM | POA: Insufficient documentation

## 2015-04-10 DIAGNOSIS — N184 Chronic kidney disease, stage 4 (severe): Secondary | ICD-10-CM | POA: Diagnosis not present

## 2015-04-10 DIAGNOSIS — M150 Primary generalized (osteo)arthritis: Secondary | ICD-10-CM | POA: Diagnosis not present

## 2015-04-10 DIAGNOSIS — E1121 Type 2 diabetes mellitus with diabetic nephropathy: Secondary | ICD-10-CM | POA: Diagnosis not present

## 2015-04-10 DIAGNOSIS — I10 Essential (primary) hypertension: Secondary | ICD-10-CM | POA: Diagnosis not present

## 2015-04-23 DIAGNOSIS — Z79899 Other long term (current) drug therapy: Secondary | ICD-10-CM | POA: Diagnosis not present

## 2015-04-23 DIAGNOSIS — M109 Gout, unspecified: Secondary | ICD-10-CM | POA: Diagnosis not present

## 2015-04-23 DIAGNOSIS — E1121 Type 2 diabetes mellitus with diabetic nephropathy: Secondary | ICD-10-CM | POA: Diagnosis not present

## 2015-04-30 DIAGNOSIS — N184 Chronic kidney disease, stage 4 (severe): Secondary | ICD-10-CM | POA: Diagnosis not present

## 2015-04-30 DIAGNOSIS — J449 Chronic obstructive pulmonary disease, unspecified: Secondary | ICD-10-CM | POA: Diagnosis not present

## 2015-04-30 DIAGNOSIS — J431 Panlobular emphysema: Secondary | ICD-10-CM | POA: Diagnosis not present

## 2015-04-30 DIAGNOSIS — E119 Type 2 diabetes mellitus without complications: Secondary | ICD-10-CM | POA: Diagnosis not present

## 2015-04-30 DIAGNOSIS — I10 Essential (primary) hypertension: Secondary | ICD-10-CM | POA: Diagnosis not present

## 2015-04-30 DIAGNOSIS — D6489 Other specified anemias: Secondary | ICD-10-CM | POA: Diagnosis not present

## 2015-04-30 DIAGNOSIS — Z794 Long term (current) use of insulin: Secondary | ICD-10-CM | POA: Diagnosis not present

## 2015-06-12 DIAGNOSIS — G4733 Obstructive sleep apnea (adult) (pediatric): Secondary | ICD-10-CM | POA: Diagnosis not present

## 2015-07-04 DIAGNOSIS — I10 Essential (primary) hypertension: Secondary | ICD-10-CM | POA: Diagnosis not present

## 2015-07-04 DIAGNOSIS — H43812 Vitreous degeneration, left eye: Secondary | ICD-10-CM | POA: Diagnosis not present

## 2015-07-04 DIAGNOSIS — H524 Presbyopia: Secondary | ICD-10-CM | POA: Diagnosis not present

## 2015-07-04 DIAGNOSIS — H401131 Primary open-angle glaucoma, bilateral, mild stage: Secondary | ICD-10-CM | POA: Diagnosis not present

## 2015-07-04 DIAGNOSIS — E119 Type 2 diabetes mellitus without complications: Secondary | ICD-10-CM | POA: Diagnosis not present

## 2015-07-04 DIAGNOSIS — H52221 Regular astigmatism, right eye: Secondary | ICD-10-CM | POA: Diagnosis not present

## 2015-07-04 DIAGNOSIS — H5211 Myopia, right eye: Secondary | ICD-10-CM | POA: Diagnosis not present

## 2015-07-04 DIAGNOSIS — H25813 Combined forms of age-related cataract, bilateral: Secondary | ICD-10-CM | POA: Diagnosis not present

## 2015-08-01 DIAGNOSIS — J431 Panlobular emphysema: Secondary | ICD-10-CM | POA: Diagnosis not present

## 2015-08-01 DIAGNOSIS — R5381 Other malaise: Secondary | ICD-10-CM | POA: Diagnosis not present

## 2015-08-01 DIAGNOSIS — E1121 Type 2 diabetes mellitus with diabetic nephropathy: Secondary | ICD-10-CM | POA: Diagnosis not present

## 2015-08-01 DIAGNOSIS — R0609 Other forms of dyspnea: Secondary | ICD-10-CM | POA: Diagnosis not present

## 2015-08-01 DIAGNOSIS — G4733 Obstructive sleep apnea (adult) (pediatric): Secondary | ICD-10-CM | POA: Diagnosis not present

## 2015-08-01 DIAGNOSIS — Z1329 Encounter for screening for other suspected endocrine disorder: Secondary | ICD-10-CM | POA: Diagnosis not present

## 2015-08-01 DIAGNOSIS — I10 Essential (primary) hypertension: Secondary | ICD-10-CM | POA: Diagnosis not present

## 2015-08-01 DIAGNOSIS — R5383 Other fatigue: Secondary | ICD-10-CM | POA: Diagnosis not present

## 2015-08-01 DIAGNOSIS — N184 Chronic kidney disease, stage 4 (severe): Secondary | ICD-10-CM | POA: Diagnosis not present

## 2015-08-01 DIAGNOSIS — E782 Mixed hyperlipidemia: Secondary | ICD-10-CM | POA: Diagnosis not present

## 2015-08-01 DIAGNOSIS — R8290 Unspecified abnormal findings in urine: Secondary | ICD-10-CM | POA: Diagnosis not present

## 2015-08-01 DIAGNOSIS — Z79899 Other long term (current) drug therapy: Secondary | ICD-10-CM | POA: Diagnosis not present

## 2015-08-07 DIAGNOSIS — R0609 Other forms of dyspnea: Secondary | ICD-10-CM | POA: Diagnosis not present

## 2015-11-06 DIAGNOSIS — G4733 Obstructive sleep apnea (adult) (pediatric): Secondary | ICD-10-CM | POA: Diagnosis not present

## 2015-11-06 DIAGNOSIS — M1A00X0 Idiopathic chronic gout, unspecified site, without tophus (tophi): Secondary | ICD-10-CM | POA: Diagnosis not present

## 2015-11-06 DIAGNOSIS — Z23 Encounter for immunization: Secondary | ICD-10-CM | POA: Diagnosis not present

## 2015-11-06 DIAGNOSIS — I10 Essential (primary) hypertension: Secondary | ICD-10-CM | POA: Diagnosis not present

## 2015-11-06 DIAGNOSIS — N184 Chronic kidney disease, stage 4 (severe): Secondary | ICD-10-CM | POA: Diagnosis not present

## 2015-11-06 DIAGNOSIS — E1121 Type 2 diabetes mellitus with diabetic nephropathy: Secondary | ICD-10-CM | POA: Diagnosis not present

## 2015-11-06 DIAGNOSIS — Z79899 Other long term (current) drug therapy: Secondary | ICD-10-CM | POA: Diagnosis not present

## 2015-11-06 DIAGNOSIS — Z0000 Encounter for general adult medical examination without abnormal findings: Secondary | ICD-10-CM | POA: Diagnosis not present

## 2015-11-06 DIAGNOSIS — D539 Nutritional anemia, unspecified: Secondary | ICD-10-CM | POA: Diagnosis not present

## 2015-12-16 DIAGNOSIS — Z1211 Encounter for screening for malignant neoplasm of colon: Secondary | ICD-10-CM | POA: Diagnosis not present

## 2015-12-16 DIAGNOSIS — I10 Essential (primary) hypertension: Secondary | ICD-10-CM | POA: Diagnosis not present

## 2015-12-17 DIAGNOSIS — Z1211 Encounter for screening for malignant neoplasm of colon: Secondary | ICD-10-CM | POA: Insufficient documentation

## 2015-12-23 DIAGNOSIS — Z1211 Encounter for screening for malignant neoplasm of colon: Secondary | ICD-10-CM | POA: Diagnosis not present

## 2015-12-23 DIAGNOSIS — Z1212 Encounter for screening for malignant neoplasm of rectum: Secondary | ICD-10-CM | POA: Diagnosis not present

## 2016-02-12 DIAGNOSIS — Z0000 Encounter for general adult medical examination without abnormal findings: Secondary | ICD-10-CM | POA: Diagnosis not present

## 2016-02-12 DIAGNOSIS — I10 Essential (primary) hypertension: Secondary | ICD-10-CM | POA: Diagnosis not present

## 2016-02-12 DIAGNOSIS — J431 Panlobular emphysema: Secondary | ICD-10-CM | POA: Diagnosis not present

## 2016-02-12 DIAGNOSIS — Z1231 Encounter for screening mammogram for malignant neoplasm of breast: Secondary | ICD-10-CM | POA: Diagnosis not present

## 2016-02-12 DIAGNOSIS — Z1329 Encounter for screening for other suspected endocrine disorder: Secondary | ICD-10-CM | POA: Diagnosis not present

## 2016-02-12 DIAGNOSIS — Z79899 Other long term (current) drug therapy: Secondary | ICD-10-CM | POA: Diagnosis not present

## 2016-02-12 DIAGNOSIS — N184 Chronic kidney disease, stage 4 (severe): Secondary | ICD-10-CM | POA: Diagnosis not present

## 2016-02-12 DIAGNOSIS — E1121 Type 2 diabetes mellitus with diabetic nephropathy: Secondary | ICD-10-CM | POA: Diagnosis not present

## 2016-02-25 DIAGNOSIS — I10 Essential (primary) hypertension: Secondary | ICD-10-CM | POA: Diagnosis not present

## 2016-02-25 DIAGNOSIS — H5211 Myopia, right eye: Secondary | ICD-10-CM | POA: Diagnosis not present

## 2016-02-25 DIAGNOSIS — H25813 Combined forms of age-related cataract, bilateral: Secondary | ICD-10-CM | POA: Diagnosis not present

## 2016-02-25 DIAGNOSIS — H524 Presbyopia: Secondary | ICD-10-CM | POA: Diagnosis not present

## 2016-02-25 DIAGNOSIS — H52221 Regular astigmatism, right eye: Secondary | ICD-10-CM | POA: Diagnosis not present

## 2016-02-25 DIAGNOSIS — H401131 Primary open-angle glaucoma, bilateral, mild stage: Secondary | ICD-10-CM | POA: Diagnosis not present

## 2016-02-25 DIAGNOSIS — H43812 Vitreous degeneration, left eye: Secondary | ICD-10-CM | POA: Diagnosis not present

## 2016-02-25 DIAGNOSIS — E119 Type 2 diabetes mellitus without complications: Secondary | ICD-10-CM | POA: Diagnosis not present

## 2016-03-12 DIAGNOSIS — Z1231 Encounter for screening mammogram for malignant neoplasm of breast: Secondary | ICD-10-CM | POA: Diagnosis not present

## 2016-04-02 DIAGNOSIS — G4733 Obstructive sleep apnea (adult) (pediatric): Secondary | ICD-10-CM | POA: Diagnosis not present

## 2016-04-06 ENCOUNTER — Encounter: Payer: Self-pay | Admitting: Emergency Medicine

## 2016-04-06 ENCOUNTER — Emergency Department: Payer: Medicare HMO

## 2016-04-06 ENCOUNTER — Emergency Department
Admission: EM | Admit: 2016-04-06 | Discharge: 2016-04-06 | Disposition: A | Discharge status: Home / Self Care | Payer: Medicare HMO | Attending: Emergency Medicine | Admitting: Emergency Medicine

## 2016-04-06 DIAGNOSIS — Z87891 Personal history of nicotine dependence: Secondary | ICD-10-CM | POA: Diagnosis not present

## 2016-04-06 DIAGNOSIS — J449 Chronic obstructive pulmonary disease, unspecified: Secondary | ICD-10-CM | POA: Insufficient documentation

## 2016-04-06 DIAGNOSIS — E119 Type 2 diabetes mellitus without complications: Secondary | ICD-10-CM | POA: Insufficient documentation

## 2016-04-06 DIAGNOSIS — J40 Bronchitis, not specified as acute or chronic: Secondary | ICD-10-CM | POA: Diagnosis not present

## 2016-04-06 DIAGNOSIS — Z7984 Long term (current) use of oral hypoglycemic drugs: Secondary | ICD-10-CM | POA: Diagnosis not present

## 2016-04-06 DIAGNOSIS — R079 Chest pain, unspecified: Secondary | ICD-10-CM | POA: Diagnosis not present

## 2016-04-06 DIAGNOSIS — I509 Heart failure, unspecified: Secondary | ICD-10-CM | POA: Insufficient documentation

## 2016-04-06 DIAGNOSIS — I110 Hypertensive heart disease with heart failure: Secondary | ICD-10-CM | POA: Insufficient documentation

## 2016-04-06 DIAGNOSIS — R05 Cough: Secondary | ICD-10-CM | POA: Diagnosis not present

## 2016-04-06 DIAGNOSIS — R0602 Shortness of breath: Secondary | ICD-10-CM | POA: Diagnosis not present

## 2016-04-06 MED ORDER — ALBUTEROL SULFATE (2.5 MG/3ML) 0.083% IN NEBU
INHALATION_SOLUTION | RESPIRATORY_TRACT | Status: AC
  Administered 2016-04-06: 5 mg via RESPIRATORY_TRACT
  Filled 2016-04-06: qty 3

## 2016-04-06 MED ORDER — ALBUTEROL SULFATE (2.5 MG/3ML) 0.083% IN NEBU
5.0000 mg | INHALATION_SOLUTION | Freq: Once | RESPIRATORY_TRACT | Status: AC
  Administered 2016-04-06: 5 mg via RESPIRATORY_TRACT

## 2016-04-06 MED ORDER — IPRATROPIUM-ALBUTEROL 0.5-2.5 (3) MG/3ML IN SOLN
3.0000 mL | Freq: Once | RESPIRATORY_TRACT | Status: AC
  Administered 2016-04-06: 3 mL via RESPIRATORY_TRACT
  Filled 2016-04-06: qty 3

## 2016-04-06 MED ORDER — ALBUTEROL SULFATE HFA 108 (90 BASE) MCG/ACT IN AERS: 2.0000 | INHALATION_SPRAY | Freq: Four times a day (QID) | RESPIRATORY_TRACT | 0 refills | Status: AC | PRN

## 2016-04-06 MED ORDER — ALBUTEROL SULFATE (2.5 MG/3ML) 0.083% IN NEBU
INHALATION_SOLUTION | RESPIRATORY_TRACT | Status: AC
  Filled 2016-04-06: qty 3

## 2016-04-06 MED ORDER — AZITHROMYCIN 250 MG PO TABS: ORAL_TABLET | ORAL | 0 refills | Status: DC

## 2016-04-06 NOTE — ED Provider Notes (Signed)
[facility] Emergency Department Provider Note  ____________________________________________  Time seen: Approximately 1:47 PM  I have reviewed the triage vital signs and the nursing notes.   HISTORY  Chief Complaint Shortness of Breath    HPI Mackenzie Hill is a 76 y.o. female that presents to the emergency department with productive cough for 3 days. Patient states that she is coughing up green sputum. Patient has had difficulty breathing. She does not smoke. This has happened in the past and she states "they gave me a really strong antibiotic, which took care of it. "Patient is diabetic and last time she took steroids she needed to be put on insulin and was advised not to use steroids if possible in the future. She has a nebulizer machine at home and has used it a couple of times. Patient is out of her inhalers. Patient denies fever, congestion, chest pain, nausea, vomiting, abdominal pain.   Past Medical History:  Diagnosis Date  . Arthritis   . Cataract   . CHF (congestive heart failure) ([place])   . COPD (chronic obstructive pulmonary disease) ([place])   . Diabetes mellitus without complication ([place])   . Gout   . Hypertension   . Sarcoidosis, lung ([place])    reported by pt  . Sleep apnea     There are no active problems to display for this patient.   History reviewed. No pertinent surgical history.  Prior to Admission medications   Medication Sig Start Date End Date Taking? Authorizing Provider  albuterol (PROVENTIL HFA;VENTOLIN HFA) 108 (90 Base) MCG/ACT inhaler Inhale 2 puffs into the lungs every 6 (six) hours as needed for wheezing or shortness of breath. 04/06/16   Laban Emperor, PA-C  amLODipine (NORVASC) 5 MG tablet Take 1 tablet by mouth daily. 05/17/14 05/17/15  Historical Provider, MD  azithromycin (ZITHROMAX Z-PAK) 250 MG tablet Take 2 tablets (500 mg) on  Day 1,  followed by 1 tablet (250 mg) once daily on Days 2 through 5. 04/06/16   Laban Emperor,  PA-C  brimonidine (ALPHAGAN) 0.15 % ophthalmic solution  11/16/13   Historical Provider, MD  budesonide-formoterol (SYMBICORT) 160-4.5 MCG/ACT inhaler Inhale 2 puffs into the lungs 2 (two) times daily.    Historical Provider, MD  Calcium Carbonate-Vitamin D 600-400 MG-UNIT per tablet Take 2 tablets by mouth daily.    Historical Provider, MD  carvedilol (COREG) 12.5 MG tablet Take 12.5 mg by mouth 2 (two) times daily with a meal.    Historical Provider, MD  Cyanocobalamin (RA VITAMIN B-12 TR) 1000 MCG TBCR Take 1 tablet by mouth daily.    Historical Provider, MD  fexofenadine (ALLEGRA) 180 MG tablet Take 180 mg by mouth daily.    Historical Provider, MD  furosemide (LASIX) 20 MG tablet Take 1 tablet by mouth 2 (two) times daily. 05/25/14   Historical Provider, MD  glipiZIDE (GLUCOTROL XL) 10 MG 24 hr tablet Take 10 mg by mouth 2 (two) times daily.    Historical Provider, MD  isosorbide mononitrate (IMDUR) 60 MG 24 hr tablet Take 3 tablets by mouth daily. 04/30/14   Historical Provider, MD  losartan (COZAAR) 50 MG tablet Take 2 tablets by mouth daily. 10/17/13   Historical Provider, MD  metFORMIN (GLUCOPHAGE) 1000 MG tablet Take 500 mg by mouth 2 (two) times daily with a meal.     Historical Provider, MD  mometasone (NASONEX) 50 MCG/ACT nasal spray Place 2 sprays into the nose daily as needed.    Historical Provider, MD  montelukast (SINGULAIR) 10 MG tablet Take 10 mg by mouth daily.    Historical Provider, MD  sitaGLIPtin (JANUVIA) 50 MG tablet Take by mouth daily. 07/02/14 07/02/15  Historical Provider, MD  spironolactone (ALDACTONE) 25 MG tablet Take 25 mg by mouth daily.    Historical Provider, MD  vitamin C (ASCORBIC ACID) 500 MG tablet Take by mouth.    Historical Provider, MD    Allergies Hydralazine; Meloxicam; Penicillins; Pioglitazone; and Tramadol  Family History  Problem Relation Age of Onset  . Diabetes Mother   . Diabetes Father   . Heart disease Father   . Diabetes Sister   . Heart  disease Sister   . Diabetes Brother   . Heart disease Brother     Social History Social History  Substance Use Topics  . Smoking status: Former Smoker    Packs/day: 0.50    Years: 3.00    Types: Cigarettes    Start date: 01/12/1962    Quit date: 01/12/1965  . Smokeless tobacco: Never Used  . Alcohol use No     Review of Systems  Constitutional: No fever/chills Eyes: No visual changes. No discharge. ENT: Negative for congestion and rhinorrhea. Cardiovascular: No chest pain. Respiratory: Positive for cough. Positive for SOB. Gastrointestinal: No abdominal pain.  No nausea, no vomiting.  No diarrhea.  No constipation. Musculoskeletal: Negative for musculoskeletal pain. Skin: Negative for rash, abrasions, lacerations, ecchymosis. Neurological: Negative for headaches.   ____________________________________________   PHYSICAL EXAM:  VITAL SIGNS: ED Triage Vitals  Enc Vitals Group     BP 04/06/16 1225 124/76     Pulse Rate 04/06/16 1225 (!) 54     Resp --      Temp 04/06/16 1225 97.7 F (36.5 C)     Temp Source 04/06/16 1225 Oral     SpO2 04/06/16 1225 97 %     Weight 04/06/16 1227 240 lb (108.9 kg)     Height 04/06/16 1227 5\' 5"  (1.651 m)     Head Circumference --      Peak Flow --      Pain Score 04/06/16 1225 0     Pain Loc --      Pain Edu? --      Excl. in [place]? --      Constitutional: Alert and oriented. Well appearing and in no acute distress. Eyes: Conjunctivae are normal. PERRL. EOMI. No discharge. Head: Atraumatic. ENT: No frontal and maxillary sinus tenderness.      Ears: Tympanic membranes pearly gray with good landmarks. No discharge.      Nose: Mild congestion/rhinnorhea.      Mouth/Throat: Mucous membranes are moist. Oropharynx non-erythematous. Tonsils not enlarged. No exudates. Uvula midline. Neck: No stridor.   Hematological/Lymphatic/Immunilogical: No cervical lymphadenopathy. Cardiovascular: Normal rate, regular rhythm.  Good peripheral  circulation. Respiratory: Normal respiratory effort without tachypnea or retractions. Scattered wheezes. Good air entry to the bases with no decreased or absent breath sounds. Gastrointestinal: Bowel sounds 4 quadrants. Soft and nontender to palpation. No guarding or rigidity. No palpable masses. No distention. Musculoskeletal: Full range of motion to all extremities. No gross deformities appreciated. Neurologic:  Normal speech and language. No gross focal neurologic deficits are appreciated.  Skin:  Skin is warm, dry and intact. No rash noted.   ____________________________________________   LABS (all labs ordered are listed, but only abnormal results are displayed)  Labs Reviewed - No data to display ____________________________________________  EKG   ____________________________________________  RADIOLOGY Robinette Haines, personally viewed and  evaluated these images (plain radiographs) as part of my medical decision making, as well as reviewing the written report by the radiologist.  Dg Chest 2 View  Result Date: 04/06/2016 CLINICAL DATA:  Three days of productive cough associated with shortness of breath in midchest pain. Sarcoidosis suspected on in January 26, 2014 chest CT scan. EXAM: CHEST  2 VIEW COMPARISON:  CT scan chest of January 26, 2014 and chest x-ray of January 08, 2014. FINDINGS: The lungs are adequately inflated. There is no focal infiltrate. There is no pleural effusion. There is mild soft tissue prominence in the right hilar region. The paratracheal regions are normal. There is calcification in the wall of the aortic arch. The heart is normal in size and contour. There is no pulmonary vascular congestion. The bony thorax exhibits no acute abnormality. IMPRESSION: There is no pneumonia nor CHF. Mild right hilar prominence likely reflects clinically suspected sarcoidosis. This has not significantly changed since the previous study. Thoracic aortic atherosclerosis.  Electronically Signed   By: David  Martinique M.D.   On: 04/06/2016 13:04    ____________________________________________    PROCEDURES  Procedure(s) performed:    Procedures    Medications  albuterol (PROVENTIL) (2.5 MG/3ML) 0.083% nebulizer solution 5 mg (5 mg Nebulization Given 04/06/16 1204)  ipratropium-albuterol (DUONEB) 0.5-2.5 (3) MG/3ML nebulizer solution 3 mL (3 mLs Nebulization Given 04/06/16 1334)     ____________________________________________   INITIAL IMPRESSION / ASSESSMENT AND PLAN / ED COURSE  Pertinent labs & imaging results that were available during my care of the patient were reviewed by me and considered in my medical decision making (see chart for details).  Review of the [place] CSRS was performed in accordance of the [place] prior to dispensing any controlled drugs.     Patient's diagnosis is consistent with bronchitis. Vital signs and exam are reassuring. Patient is afebrile. No acute processes indicated on chest x-ray. She received albuterol nebulizer and DuoNeb in ED. Patient feels comfortable going home. Patient will be discharged home with prescriptions for azithromycin and albuterol inhaler. Patient will use nebulizer at home. I will not start steroids at this time since patient is diabetic and was advised not to do steroids unless necessary. Patient is to follow up with PCP on Wednesday to ensure that symptoms are improving. Patient is given ED precautions to return to the ED for any worsening or new symptoms.     ____________________________________________  FINAL CLINICAL IMPRESSION(S) / ED DIAGNOSES  Final diagnoses:  Bronchitis      NEW MEDICATIONS STARTED DURING THIS VISIT:  Discharge Medication List as of 04/06/2016  2:11 PM    START taking these medications   Details  azithromycin (ZITHROMAX Z-PAK) 250 MG tablet Take 2 tablets (500 mg) on  Day 1,  followed by 1 tablet (250 mg) once daily on Days 2 through 5., Print             This chart was dictated using voice recognition software/Dragon. Despite best efforts to proofread, errors can occur which can change the meaning. Any change was purely unintentional.    Laban Emperor, PA-C 04/06/16 1842    Earleen Newport, MD 04/07/16 [phone]

## 2016-04-06 NOTE — ED Notes (Signed)
See triage note  Presents with cough and some SOB since Friday  Cough is prod  Greenish in color afebrile on arrival

## 2016-04-06 NOTE — ED Triage Notes (Signed)
Says cough with green sputum since Friday.  Pt was given svn in triage area.

## 2016-04-08 DIAGNOSIS — D869 Sarcoidosis, unspecified: Secondary | ICD-10-CM | POA: Diagnosis not present

## 2016-04-08 DIAGNOSIS — J431 Panlobular emphysema: Secondary | ICD-10-CM | POA: Diagnosis not present

## 2016-04-08 DIAGNOSIS — G4733 Obstructive sleep apnea (adult) (pediatric): Secondary | ICD-10-CM | POA: Diagnosis not present

## 2016-04-08 DIAGNOSIS — Z9989 Dependence on other enabling machines and devices: Secondary | ICD-10-CM | POA: Diagnosis not present

## 2016-04-20 DIAGNOSIS — M1A00X0 Idiopathic chronic gout, unspecified site, without tophus (tophi): Secondary | ICD-10-CM | POA: Diagnosis not present

## 2016-04-20 DIAGNOSIS — G4733 Obstructive sleep apnea (adult) (pediatric): Secondary | ICD-10-CM | POA: Diagnosis not present

## 2016-04-20 DIAGNOSIS — M10072 Idiopathic gout, left ankle and foot: Secondary | ICD-10-CM | POA: Diagnosis not present

## 2016-04-20 DIAGNOSIS — N184 Chronic kidney disease, stage 4 (severe): Secondary | ICD-10-CM | POA: Diagnosis not present

## 2016-04-24 DIAGNOSIS — Z9989 Dependence on other enabling machines and devices: Secondary | ICD-10-CM | POA: Diagnosis not present

## 2016-04-24 DIAGNOSIS — G4733 Obstructive sleep apnea (adult) (pediatric): Secondary | ICD-10-CM | POA: Diagnosis not present

## 2016-04-24 DIAGNOSIS — R05 Cough: Secondary | ICD-10-CM | POA: Diagnosis not present

## 2016-05-01 DIAGNOSIS — G4730 Sleep apnea, unspecified: Secondary | ICD-10-CM | POA: Diagnosis not present

## 2016-05-01 IMAGING — CT CT CHEST W/O CM
2 of 3 series · 14 of 36 positions shown, 17 images · non-contrast
Comparison: Chest CT 07/17/2013.

CLINICAL DATA: 74-year-old female with shortness of breath for the
past 2 months with dry cough.

EXAM:
CT CHEST WITHOUT CONTRAST
TECHNIQUE: Multidetector CT imaging of the chest was performed following the
standard protocol without IV contrast..

[Series 2: routine chest wo · axial · 0.73mm/px · z∈[-624,-404]mm · 11 of 52 slices shown, 14 images]
[im 4/52  mediastinal]
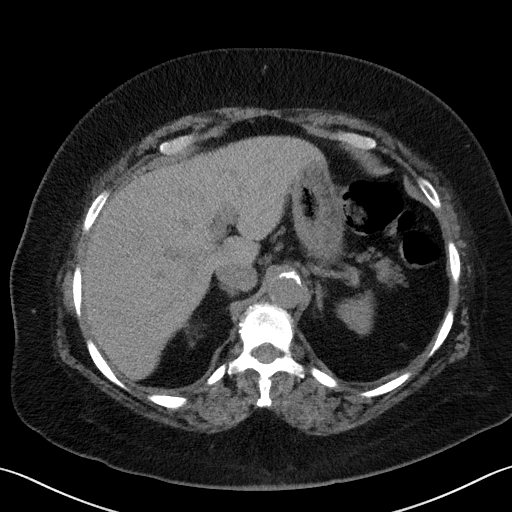
[im 4/52  lung]
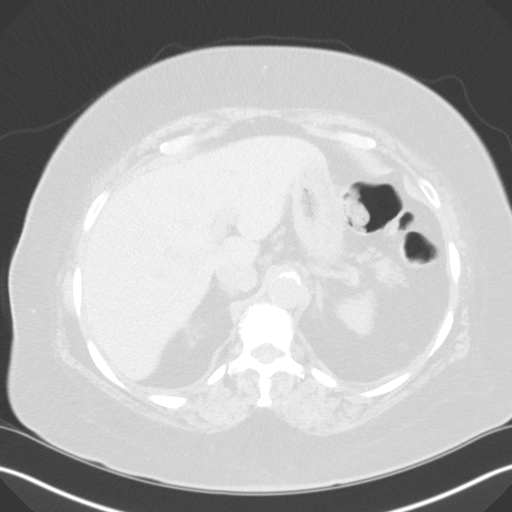
[im 8/52  lung]
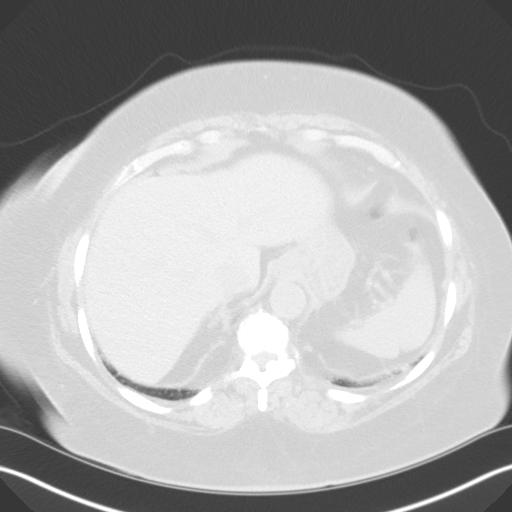
[im 12/52  lung]
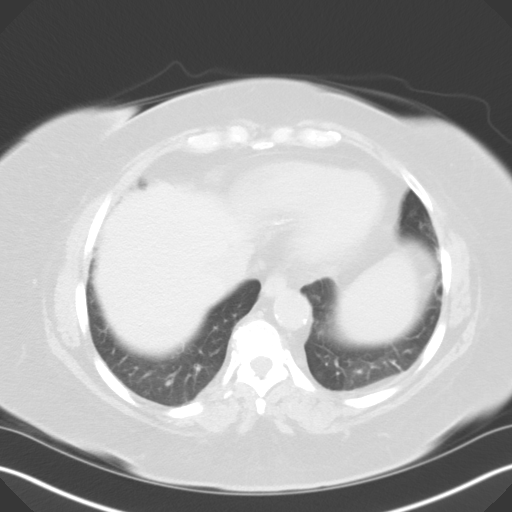
[im 18/52  lung]
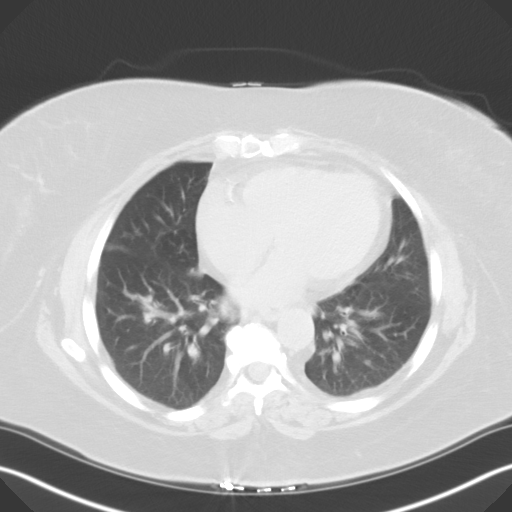
[im 21/52  mediastinal]
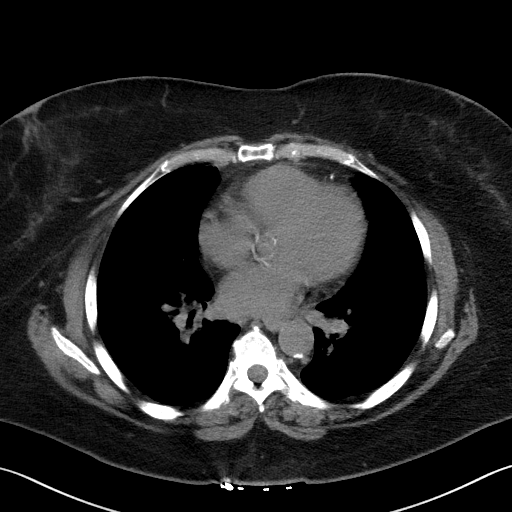
[im 21/52  lung]
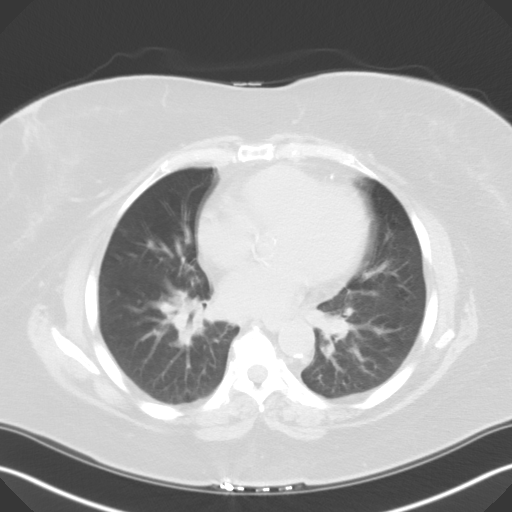
[im 27/52  lung]
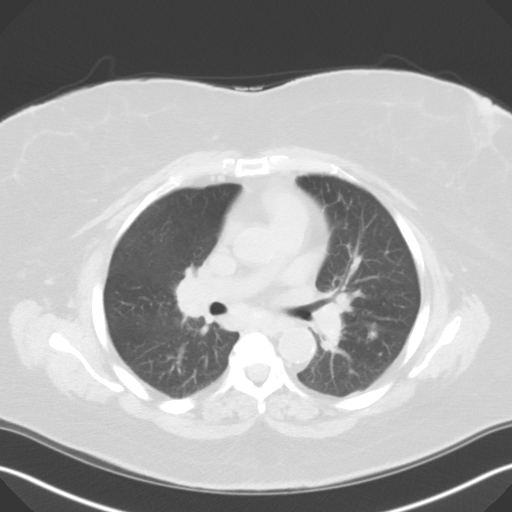
[im 31/52  lung]
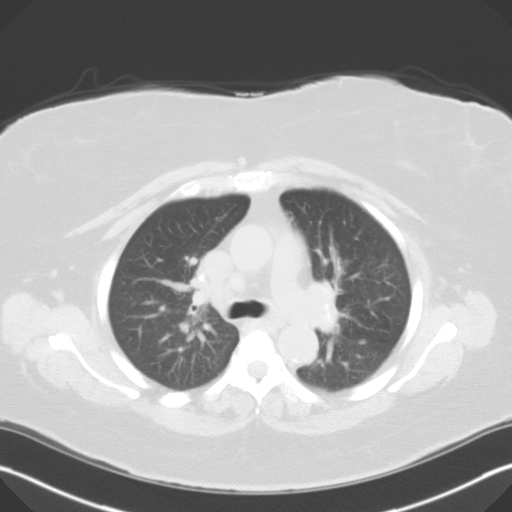
[im 35/52  lung]
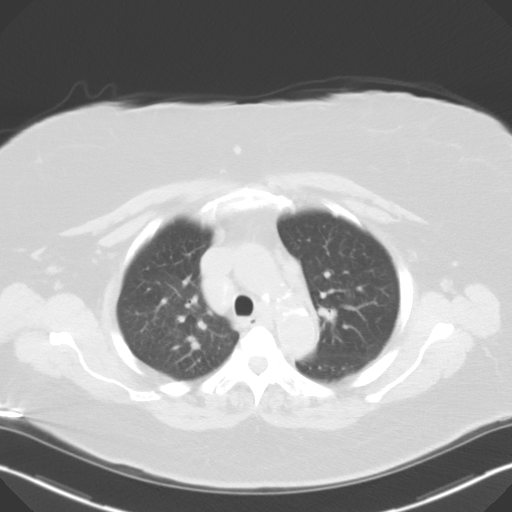
[im 40/52  mediastinal]
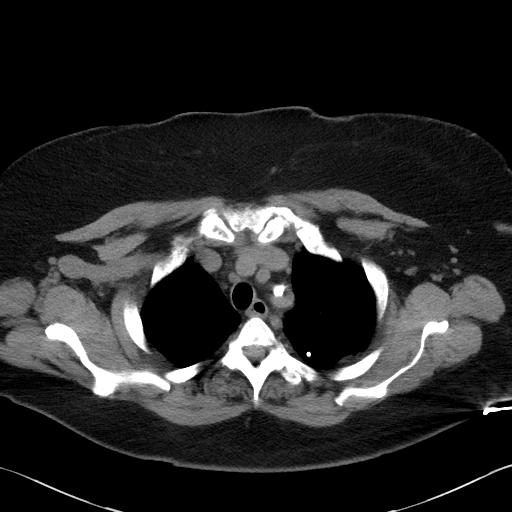
[im 40/52  lung]
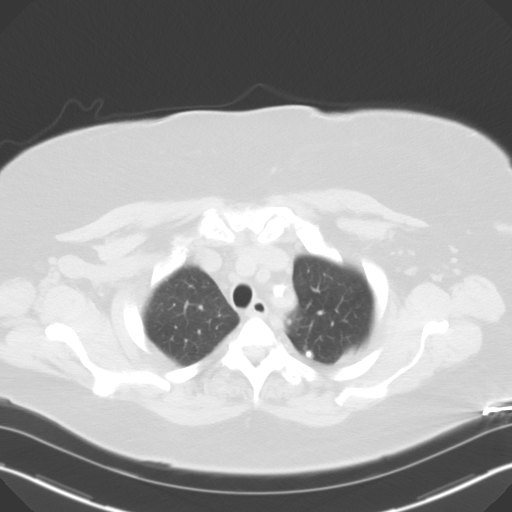
[im 44/52  lung]
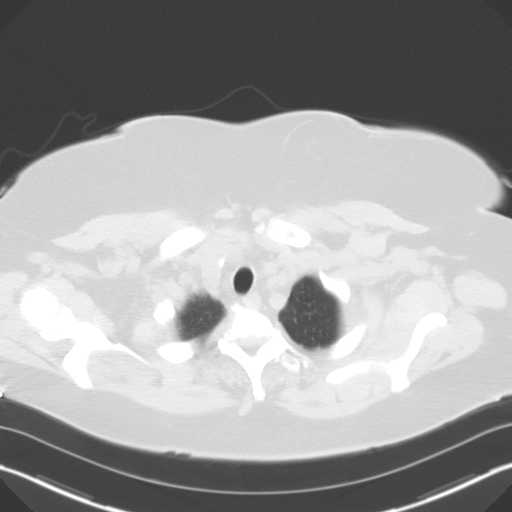
[im 48/52  lung]
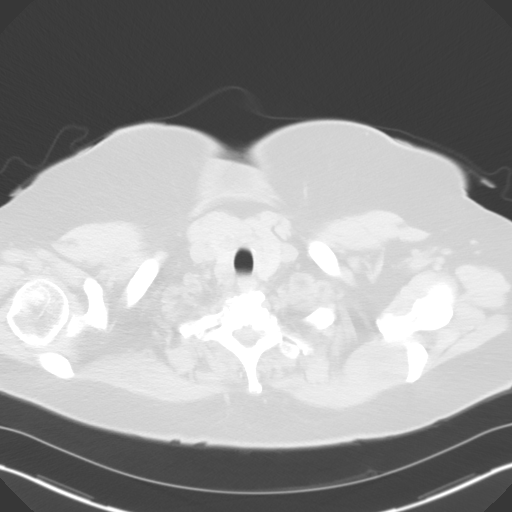

[Series 5: cor routine chest wo · coronal · 0.53mm/px · 3 of 168 slices shown]
[im 34/168  lung]
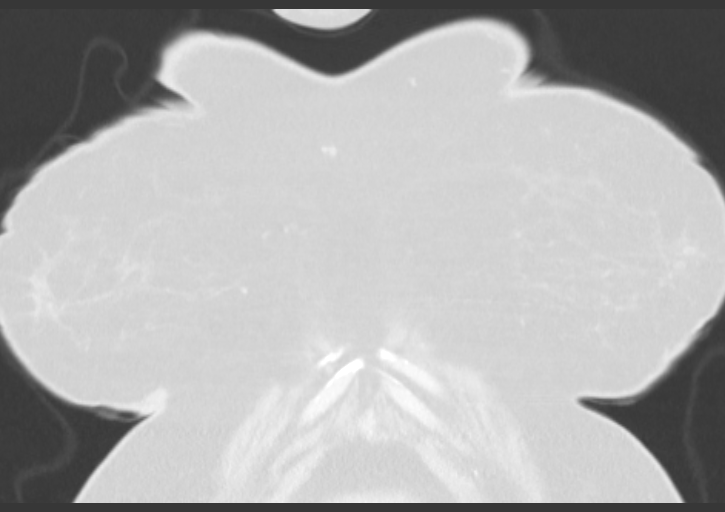
[im 67/168  lung]
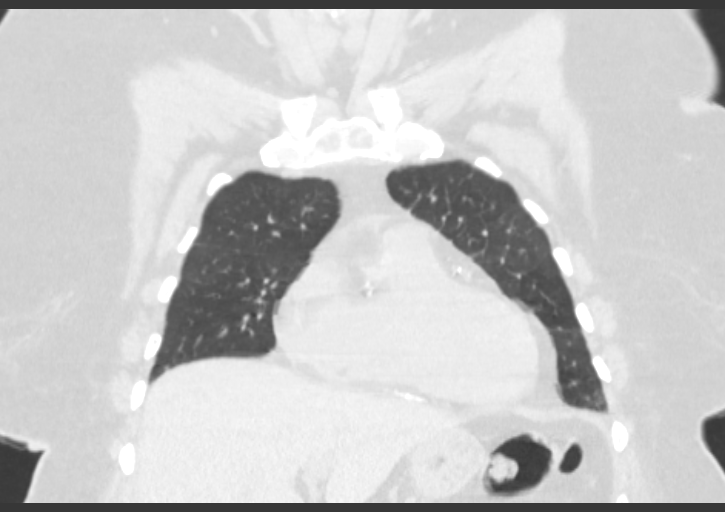
[im 101/168  lung]
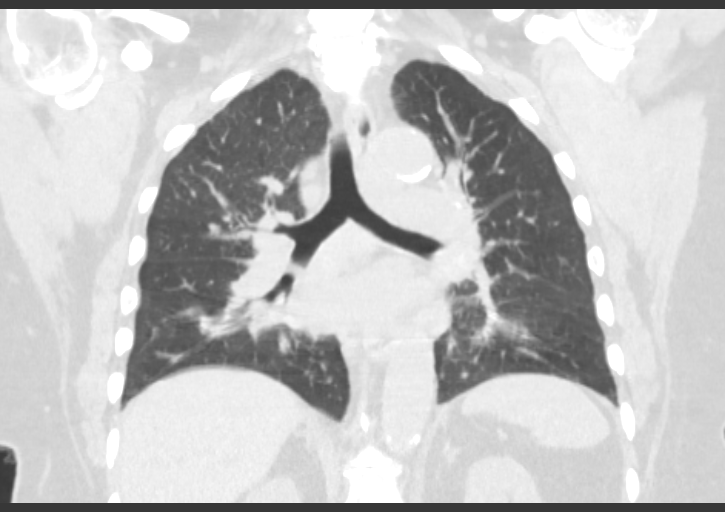

[14 of 36 positions shown; findings below may reference images not displayed]

FINDINGS: Mediastinum/Lymph Nodes: Again noted are multiple enlarged partially
calcified mediastinal and bilateral hilar lymph nodes, similar to
the prior examination, compatible with the reported clinical history
of sarcoidosis. Heart size is mildly enlarged. There is no
significant pericardial fluid, thickening or pericardial
calcification. There is atherosclerosis of the thoracic aorta, the
great vessels of the mediastinum and the coronary arteries,
including calcified atherosclerotic plaque in the left main, left
anterior descending, left circumflex and right coronary arteries.
Faint calcifications of the aortic valve. Esophagus is unremarkable
in appearance. No axillary lymphadenopathy noted.

Lungs/Pleura: Mild thickening of the peribronchovascular
interstitium in the perihilar aspects of the lungs bilaterally,
similar to the prior examination. Several small nodules are again
noted along the fissures bilaterally, largest of which measures 7 x
8 mm in the left lower lobe (image 25 of series 4), and is partially
calcified. A few subpleural nodules are also noted. Calcified
granuloma in the posterior aspect of the left upper lobe (image 13
of series 4). No other larger more suspicious appearing pulmonary
nodules or masses are otherwise noted. No acute consolidative
airspace disease. No pleural effusions.

Musculoskeletal/Soft Tissues: There are no aggressive appearing
lytic or blastic lesions noted in the visualized portions of the
skeleton.

Upper Abdomen: Unremarkable.
IMPRESSION: 1. Spectrum of findings in the chest again most compatible with
underlying sarcoidosis. The appearance is largely unchanged compared
to remote prior study 02/13/2011, and considered benign.
2. Atherosclerosis, including left main and 3 vessel coronary artery
disease. Assessment for potential risk factor modification, dietary
therapy or pharmacologic therapy may be warranted, if clinically
indicated.
3. Mild cardiomegaly.

## 2016-05-08 DIAGNOSIS — E1122 Type 2 diabetes mellitus with diabetic chronic kidney disease: Secondary | ICD-10-CM | POA: Diagnosis not present

## 2016-05-08 DIAGNOSIS — I129 Hypertensive chronic kidney disease with stage 1 through stage 4 chronic kidney disease, or unspecified chronic kidney disease: Secondary | ICD-10-CM | POA: Diagnosis not present

## 2016-05-08 DIAGNOSIS — D631 Anemia in chronic kidney disease: Secondary | ICD-10-CM | POA: Diagnosis not present

## 2016-05-08 DIAGNOSIS — M109 Gout, unspecified: Secondary | ICD-10-CM | POA: Diagnosis not present

## 2016-05-08 DIAGNOSIS — N184 Chronic kidney disease, stage 4 (severe): Secondary | ICD-10-CM | POA: Diagnosis not present

## 2016-05-09 DIAGNOSIS — G4730 Sleep apnea, unspecified: Secondary | ICD-10-CM | POA: Diagnosis not present

## 2016-05-13 DIAGNOSIS — Z794 Long term (current) use of insulin: Secondary | ICD-10-CM | POA: Diagnosis not present

## 2016-05-13 DIAGNOSIS — N184 Chronic kidney disease, stage 4 (severe): Secondary | ICD-10-CM | POA: Diagnosis not present

## 2016-05-13 DIAGNOSIS — E119 Type 2 diabetes mellitus without complications: Secondary | ICD-10-CM | POA: Diagnosis not present

## 2016-05-13 DIAGNOSIS — I10 Essential (primary) hypertension: Secondary | ICD-10-CM | POA: Diagnosis not present

## 2016-05-26 DIAGNOSIS — D539 Nutritional anemia, unspecified: Secondary | ICD-10-CM | POA: Diagnosis not present

## 2016-05-26 DIAGNOSIS — E1121 Type 2 diabetes mellitus with diabetic nephropathy: Secondary | ICD-10-CM | POA: Diagnosis not present

## 2016-05-26 DIAGNOSIS — N184 Chronic kidney disease, stage 4 (severe): Secondary | ICD-10-CM | POA: Diagnosis not present

## 2016-05-26 DIAGNOSIS — H52221 Regular astigmatism, right eye: Secondary | ICD-10-CM | POA: Diagnosis not present

## 2016-05-26 DIAGNOSIS — H5211 Myopia, right eye: Secondary | ICD-10-CM | POA: Diagnosis not present

## 2016-05-26 DIAGNOSIS — H43812 Vitreous degeneration, left eye: Secondary | ICD-10-CM | POA: Diagnosis not present

## 2016-05-26 DIAGNOSIS — H401131 Primary open-angle glaucoma, bilateral, mild stage: Secondary | ICD-10-CM | POA: Diagnosis not present

## 2016-05-26 DIAGNOSIS — M1611 Unilateral primary osteoarthritis, right hip: Secondary | ICD-10-CM | POA: Diagnosis not present

## 2016-05-26 DIAGNOSIS — H524 Presbyopia: Secondary | ICD-10-CM | POA: Diagnosis not present

## 2016-05-26 DIAGNOSIS — E119 Type 2 diabetes mellitus without complications: Secondary | ICD-10-CM | POA: Diagnosis not present

## 2016-05-26 DIAGNOSIS — M25551 Pain in right hip: Secondary | ICD-10-CM | POA: Diagnosis not present

## 2016-05-26 DIAGNOSIS — H25813 Combined forms of age-related cataract, bilateral: Secondary | ICD-10-CM | POA: Diagnosis not present

## 2016-05-26 DIAGNOSIS — I10 Essential (primary) hypertension: Secondary | ICD-10-CM | POA: Diagnosis not present

## 2016-06-24 DIAGNOSIS — M1611 Unilateral primary osteoarthritis, right hip: Secondary | ICD-10-CM | POA: Diagnosis not present

## 2016-07-07 ENCOUNTER — Encounter: Payer: Self-pay | Admitting: *Deleted

## 2016-07-07 ENCOUNTER — Encounter
Admission: RE | Admit: 2016-07-07 | Discharge: 2016-07-07 | Disposition: A | Discharge status: Home / Self Care | Payer: Medicare HMO | Source: Ambulatory Visit | Attending: Orthopedic Surgery | Admitting: Orthopedic Surgery

## 2016-07-07 DIAGNOSIS — E1122 Type 2 diabetes mellitus with diabetic chronic kidney disease: Secondary | ICD-10-CM | POA: Diagnosis not present

## 2016-07-07 DIAGNOSIS — D860 Sarcoidosis of lung: Secondary | ICD-10-CM | POA: Insufficient documentation

## 2016-07-07 DIAGNOSIS — Z01812 Encounter for preprocedural laboratory examination: Secondary | ICD-10-CM | POA: Insufficient documentation

## 2016-07-07 DIAGNOSIS — M5136 Other intervertebral disc degeneration, lumbar region: Secondary | ICD-10-CM | POA: Diagnosis not present

## 2016-07-07 DIAGNOSIS — I509 Heart failure, unspecified: Secondary | ICD-10-CM | POA: Insufficient documentation

## 2016-07-07 DIAGNOSIS — R001 Bradycardia, unspecified: Secondary | ICD-10-CM | POA: Insufficient documentation

## 2016-07-07 DIAGNOSIS — I130 Hypertensive heart and chronic kidney disease with heart failure and stage 1 through stage 4 chronic kidney disease, or unspecified chronic kidney disease: Secondary | ICD-10-CM | POA: Insufficient documentation

## 2016-07-07 DIAGNOSIS — N189 Chronic kidney disease, unspecified: Secondary | ICD-10-CM | POA: Diagnosis not present

## 2016-07-07 DIAGNOSIS — J449 Chronic obstructive pulmonary disease, unspecified: Secondary | ICD-10-CM | POA: Insufficient documentation

## 2016-07-07 DIAGNOSIS — M109 Gout, unspecified: Secondary | ICD-10-CM | POA: Diagnosis not present

## 2016-07-07 DIAGNOSIS — Z01810 Encounter for preprocedural cardiovascular examination: Secondary | ICD-10-CM | POA: Insufficient documentation

## 2016-07-07 DIAGNOSIS — I10 Essential (primary) hypertension: Secondary | ICD-10-CM | POA: Diagnosis not present

## 2016-07-07 HISTORY — DX: Other intervertebral disc degeneration, lumbar region: M51.36

## 2016-07-07 HISTORY — DX: Dyspnea, unspecified: R06.00

## 2016-07-07 HISTORY — DX: Anemia, unspecified: D64.9

## 2016-07-07 HISTORY — DX: Other intervertebral disc degeneration, lumbar region without mention of lumbar back pain or lower extremity pain: M51.369

## 2016-07-07 HISTORY — DX: Chronic kidney disease, unspecified: N18.9

## 2016-07-07 LAB — URINALYSIS, COMPLETE (UACMP) WITH MICROSCOPIC
BILIRUBIN URINE: NEGATIVE
Bacteria, UA: NONE SEEN
Glucose, UA: NEGATIVE mg/dL
Hgb urine dipstick: NEGATIVE
KETONES UR: NEGATIVE mg/dL
LEUKOCYTES UA: NEGATIVE
Nitrite: NEGATIVE
PH: 6 (ref 5.0–8.0)
Protein, ur: NEGATIVE mg/dL
RBC / HPF: NONE SEEN RBC/hpf (ref 0–5)
Specific Gravity, Urine: 1.013 (ref 1.005–1.030)

## 2016-07-07 LAB — CBC
HCT: 33.7 % — ABNORMAL LOW (ref 35.0–47.0)
Hemoglobin: 11.4 g/dL — ABNORMAL LOW (ref 12.0–16.0)
MCH: 30.8 pg (ref 26.0–34.0)
MCHC: 33.9 g/dL (ref 32.0–36.0)
MCV: 90.8 fL (ref 80.0–100.0)
PLATELETS: 313 10*3/uL (ref 150–440)
RBC: 3.72 MIL/uL — AB (ref 3.80–5.20)
RDW: 13.2 % (ref 11.5–14.5)
WBC: 5.8 10*3/uL (ref 3.6–11.0)

## 2016-07-07 LAB — TYPE AND SCREEN
ABO/RH(D): O POS
Antibody Screen: NEGATIVE

## 2016-07-07 LAB — SURGICAL PCR SCREEN
MRSA, PCR: NEGATIVE
Staphylococcus aureus: NEGATIVE

## 2016-07-07 LAB — BASIC METABOLIC PANEL
ANION GAP: 7 (ref 5–15)
BUN: 26 mg/dL — ABNORMAL HIGH (ref 6–20)
CO2: 25 mmol/L (ref 22–32)
Calcium: 9.4 mg/dL (ref 8.9–10.3)
Chloride: 107 mmol/L (ref 101–111)
Creatinine, Ser: 1.86 mg/dL — ABNORMAL HIGH (ref 0.44–1.00)
GFR, EST AFRICAN AMERICAN: 29 mL/min — AB (ref >60)
GFR, EST NON AFRICAN AMERICAN: 25 mL/min — AB (ref >60)
Glucose, Bld: 120 mg/dL — ABNORMAL HIGH (ref 65–99)
POTASSIUM: 4.2 mmol/L (ref 3.5–5.1)
SODIUM: 139 mmol/L (ref 135–145)

## 2016-07-07 LAB — PROTIME-INR
INR: 0.99
PROTHROMBIN TIME: 13.1 s (ref 11.4–15.2)

## 2016-07-07 LAB — SEDIMENTATION RATE: SED RATE: 46 mm/h — AB (ref 0–30)

## 2016-07-07 LAB — APTT: APTT: 29 s (ref 24–36)

## 2016-07-07 NOTE — Patient Instructions (Signed)
Your procedure is scheduled on: July 21, 2016 [facility]) Report to Same Day Surgery 2nd floor medical mall ([place] Entrance-take elevator on left to 2nd floor.  Check in with surgery information desk.) To find out your arrival time please call [phone] between 1PM - 3PM on July 20, 2016 (MONDAY)   Remember: Instructions that are not followed completely may result in serious medical risk, up to and including death, or upon the discretion of your surgeon and anesthesiologist your surgery may need to be rescheduled.    _x___ 1. Do not eat food or drink liquids after midnight. No gum chewing or hard candies                                 __x__ 2. No Alcohol for 24 hours before or after surgery.   __x__3. No Smoking for 24 prior to surgery.   ____  4. Bring all medications with you on the day of surgery if instructed.    __x__ 5. Notify your doctor if there is any change in your medical condition     (cold, fever, infections).     Do not wear jewelry, make-up, hairpins, clips or nail polish.  Do not wear lotions, powders, or perfumes.   Do not shave 48 hours prior to surgery. Men may shave face and neck.  Do not bring valuables to the hospital.    [facility] is not responsible for any belongings or valuables.               Contacts, dentures or bridgework may not be worn into surgery.  Leave your suitcase in the car. After surgery it may be brought to your room.  For patients admitted to the hospital, discharge time is determined by your treatment team                      Patients discharged the day of surgery will not be allowed to drive home.  You will need someone to drive you home and stay with you the night of your procedure.    Please read over the following fact sheets that you were given:   [facility] Preparing for Surgery and or MRSA Information   _x___ Take the following medications the morning of surgery with a sip of water :  1. AMLODIPINE  2. LOSARTAN  3.  ISOSORBIDE MONONITRATE  4.  5.  6.  ____Fleets enema or Magnesium Citrate as directed.   _x___ Use CHG Soap or sage wipes as directed on instruction sheet   _x___ Use inhalers on the day of surgery and bring to hospital day of surgery (USE DUONEB  NEBULIZER AND SYMBICORT INHALER THE MORNING OF SURGERY AND BRING ALBUTEROL AND [place] )   _x___ Stop Metformin and Janumet 2 days prior to surgery.  (STOP METFORMIN  ON JULY 8 )   _X___ Take 1/2 of usual insulin dose the night before surgery and none on the morning surgery.  (NO JANUVIA THE MORNING OF SURGERY )   _x___ Follow recommendations from Cardiologist, Pulmonologist or PCP regarding          stopping Aspirin, Coumadin, Plavix ,Eliquis, Effient, or Pradaxa, and Pletal.  X____Stop Anti-inflammatories such as Advil, Aleve, Ibuprofen, Motrin, Naproxen, Naprosyn, Goodies powders or aspirin products one week before surgery. OK to take Tylenol  ( STOP DICLOFENAC GEL ONE WEEK BEFORE SURGERY )  _x___ Stop supplements until after surgery.  But may continue Vitamin D, Vitamin B,and multivitamin (STOP VITAMIN C NOW )         _x___ Bring C-Pap to the hospital.

## 2016-07-07 NOTE — Pre-Procedure Instructions (Signed)
EKG COMPARED WITH OLD 

## 2016-07-08 LAB — URINE CULTURE

## 2016-07-20 MED ORDER — CLINDAMYCIN PHOSPHATE 900 MG/50ML IV SOLN
900.0000 mg | Freq: Once | INTRAVENOUS | Status: AC
  Administered 2016-07-21: 900 mg via INTRAVENOUS

## 2016-07-21 ENCOUNTER — Encounter
Admission: RE | Disposition: A | Discharge status: Skilled Nursing Facility | Payer: Self-pay | Source: Ambulatory Visit | Attending: Orthopedic Surgery

## 2016-07-21 ENCOUNTER — Inpatient Hospital Stay: Payer: Medicare HMO

## 2016-07-21 ENCOUNTER — Inpatient Hospital Stay
Admission: RE | Admit: 2016-07-21 | Discharge: 2016-07-23 | DRG: 470 | Disposition: A | Discharge status: Skilled Nursing Facility | Payer: Medicare HMO | Source: Ambulatory Visit | Attending: Orthopedic Surgery | Admitting: Orthopedic Surgery

## 2016-07-21 ENCOUNTER — Inpatient Hospital Stay: Payer: Medicare HMO | Admitting: Anesthesiology

## 2016-07-21 ENCOUNTER — Encounter: Payer: Self-pay | Admitting: *Deleted

## 2016-07-21 DIAGNOSIS — I5032 Chronic diastolic (congestive) heart failure: Secondary | ICD-10-CM | POA: Diagnosis not present

## 2016-07-21 DIAGNOSIS — Z87891 Personal history of nicotine dependence: Secondary | ICD-10-CM | POA: Diagnosis not present

## 2016-07-21 DIAGNOSIS — D860 Sarcoidosis of lung: Secondary | ICD-10-CM | POA: Diagnosis not present

## 2016-07-21 DIAGNOSIS — M25551 Pain in right hip: Secondary | ICD-10-CM | POA: Diagnosis present

## 2016-07-21 DIAGNOSIS — Z888 Allergy status to other drugs, medicaments and biological substances status: Secondary | ICD-10-CM | POA: Diagnosis not present

## 2016-07-21 DIAGNOSIS — G8918 Other acute postprocedural pain: Secondary | ICD-10-CM

## 2016-07-21 DIAGNOSIS — G4730 Sleep apnea, unspecified: Secondary | ICD-10-CM | POA: Diagnosis not present

## 2016-07-21 DIAGNOSIS — N184 Chronic kidney disease, stage 4 (severe): Secondary | ICD-10-CM | POA: Diagnosis present

## 2016-07-21 DIAGNOSIS — J431 Panlobular emphysema: Secondary | ICD-10-CM | POA: Diagnosis not present

## 2016-07-21 DIAGNOSIS — E1122 Type 2 diabetes mellitus with diabetic chronic kidney disease: Secondary | ICD-10-CM | POA: Diagnosis present

## 2016-07-21 DIAGNOSIS — I509 Heart failure, unspecified: Secondary | ICD-10-CM | POA: Diagnosis not present

## 2016-07-21 DIAGNOSIS — Z471 Aftercare following joint replacement surgery: Secondary | ICD-10-CM | POA: Diagnosis not present

## 2016-07-21 DIAGNOSIS — J449 Chronic obstructive pulmonary disease, unspecified: Secondary | ICD-10-CM | POA: Diagnosis present

## 2016-07-21 DIAGNOSIS — Z96641 Presence of right artificial hip joint: Secondary | ICD-10-CM | POA: Diagnosis not present

## 2016-07-21 DIAGNOSIS — E669 Obesity, unspecified: Secondary | ICD-10-CM | POA: Diagnosis not present

## 2016-07-21 DIAGNOSIS — M1611 Unilateral primary osteoarthritis, right hip: Secondary | ICD-10-CM | POA: Diagnosis not present

## 2016-07-21 DIAGNOSIS — Z7984 Long term (current) use of oral hypoglycemic drugs: Secondary | ICD-10-CM

## 2016-07-21 DIAGNOSIS — H269 Unspecified cataract: Secondary | ICD-10-CM | POA: Diagnosis present

## 2016-07-21 DIAGNOSIS — Z79899 Other long term (current) drug therapy: Secondary | ICD-10-CM | POA: Diagnosis not present

## 2016-07-21 DIAGNOSIS — Z6839 Body mass index (BMI) 39.0-39.9, adult: Secondary | ICD-10-CM

## 2016-07-21 DIAGNOSIS — Z419 Encounter for procedure for purposes other than remedying health state, unspecified: Secondary | ICD-10-CM

## 2016-07-21 DIAGNOSIS — M109 Gout, unspecified: Secondary | ICD-10-CM | POA: Diagnosis not present

## 2016-07-21 DIAGNOSIS — I110 Hypertensive heart disease with heart failure: Secondary | ICD-10-CM | POA: Diagnosis not present

## 2016-07-21 DIAGNOSIS — I130 Hypertensive heart and chronic kidney disease with heart failure and stage 1 through stage 4 chronic kidney disease, or unspecified chronic kidney disease: Secondary | ICD-10-CM | POA: Diagnosis not present

## 2016-07-21 DIAGNOSIS — R262 Difficulty in walking, not elsewhere classified: Secondary | ICD-10-CM | POA: Diagnosis not present

## 2016-07-21 DIAGNOSIS — Z880 Allergy status to penicillin: Secondary | ICD-10-CM | POA: Diagnosis not present

## 2016-07-21 DIAGNOSIS — R6889 Other general symptoms and signs: Secondary | ICD-10-CM | POA: Diagnosis not present

## 2016-07-21 DIAGNOSIS — M6281 Muscle weakness (generalized): Secondary | ICD-10-CM | POA: Diagnosis not present

## 2016-07-21 DIAGNOSIS — Z743 Need for continuous supervision: Secondary | ICD-10-CM | POA: Diagnosis not present

## 2016-07-21 DIAGNOSIS — G4733 Obstructive sleep apnea (adult) (pediatric): Secondary | ICD-10-CM | POA: Diagnosis present

## 2016-07-21 HISTORY — PX: TOTAL HIP ARTHROPLASTY: SHX124

## 2016-07-21 LAB — CREATININE, SERUM
Creatinine, Ser: 1.63 mg/dL — ABNORMAL HIGH (ref 0.44–1.00)
GFR calc Af Amer: 34 mL/min — ABNORMAL LOW (ref >60)
GFR calc non Af Amer: 30 mL/min — ABNORMAL LOW (ref >60)

## 2016-07-21 LAB — GLUCOSE, CAPILLARY
GLUCOSE-CAPILLARY: 153 mg/dL — AB (ref 65–99)
GLUCOSE-CAPILLARY: 124 mg/dL — AB (ref 65–99)
Glucose-Capillary: 212 mg/dL — ABNORMAL HIGH (ref 65–99)

## 2016-07-21 LAB — CBC
HCT: 31 % — ABNORMAL LOW (ref 35.0–47.0)
Hemoglobin: 10.6 g/dL — ABNORMAL LOW (ref 12.0–16.0)
MCH: 31 pg (ref 26.0–34.0)
MCHC: 34 g/dL (ref 32.0–36.0)
MCV: 91.2 fL (ref 80.0–100.0)
PLATELETS: 286 10*3/uL (ref 150–440)
RBC: 3.4 MIL/uL — ABNORMAL LOW (ref 3.80–5.20)
RDW: 13.3 % (ref 11.5–14.5)
WBC: 5.6 10*3/uL (ref 3.6–11.0)

## 2016-07-21 LAB — ABO/RH: ABO/RH(D): O POS

## 2016-07-21 SURGERY — ARTHROPLASTY, HIP, TOTAL, ANTERIOR APPROACH
Anesthesia: Spinal | Laterality: Right | Wound class: Clean

## 2016-07-21 MED ORDER — DEXAMETHASONE SODIUM PHOSPHATE 10 MG/ML IJ SOLN
INTRAMUSCULAR | Status: AC
  Filled 2016-07-21: qty 1

## 2016-07-21 MED ORDER — TRANEXAMIC ACID 1000 MG/10ML IV SOLN
INTRAVENOUS | Status: AC | PRN
  Administered 2016-07-21: 1000 mg via TOPICAL

## 2016-07-21 MED ORDER — PROPOFOL 500 MG/50ML IV EMUL
INTRAVENOUS | Status: AC
  Filled 2016-07-21: qty 50

## 2016-07-21 MED ORDER — SPIRONOLACTONE 25 MG PO TABS
25.0000 mg | ORAL_TABLET | Freq: Every day | ORAL | Status: DC
  Administered 2016-07-22 – 2016-07-23 (×2): 25 mg via ORAL
  Filled 2016-07-21 (×2): qty 1

## 2016-07-21 MED ORDER — ENOXAPARIN SODIUM 40 MG/0.4ML ~~LOC~~ SOLN
40.0000 mg | SUBCUTANEOUS | Status: DC
  Administered 2016-07-22 – 2016-07-23 (×2): 40 mg via SUBCUTANEOUS
  Filled 2016-07-21 (×2): qty 0.4

## 2016-07-21 MED ORDER — MENTHOL 3 MG MT LOZG
1.0000 | LOZENGE | OROMUCOSAL | Status: DC | PRN
  Filled 2016-07-21: qty 9

## 2016-07-21 MED ORDER — ONDANSETRON HCL 4 MG PO TABS: 4.0000 mg | ORAL_TABLET | Freq: Four times a day (QID) | ORAL | Status: DC | PRN

## 2016-07-21 MED ORDER — MAGNESIUM CITRATE PO SOLN
1.0000 | Freq: Once | ORAL | Status: AC | PRN
  Administered 2016-07-23: 1 via ORAL
  Filled 2016-07-21: qty 296

## 2016-07-21 MED ORDER — CLINDAMYCIN PHOSPHATE 900 MG/50ML IV SOLN
900.0000 mg | Freq: Four times a day (QID) | INTRAVENOUS | Status: AC
  Administered 2016-07-21 – 2016-07-22 (×4): 900 mg via INTRAVENOUS
  Filled 2016-07-21 (×4): qty 50

## 2016-07-21 MED ORDER — AMLODIPINE BESYLATE 5 MG PO TABS
5.0000 mg | ORAL_TABLET | Freq: Every day | ORAL | Status: DC
  Administered 2016-07-22 – 2016-07-23 (×2): 5 mg via ORAL
  Filled 2016-07-21 (×2): qty 1

## 2016-07-21 MED ORDER — LOSARTAN POTASSIUM 50 MG PO TABS
100.0000 mg | ORAL_TABLET | Freq: Every day | ORAL | Status: DC
Start: 2016-07-22 — End: 2016-07-23
  Administered 2016-07-22 – 2016-07-23 (×2): 100 mg via ORAL
  Filled 2016-07-21 (×2): qty 2

## 2016-07-21 MED ORDER — MORPHINE SULFATE (PF) 2 MG/ML IV SOLN: 2.0000 mg | INTRAVENOUS | Status: DC | PRN

## 2016-07-21 MED ORDER — ACETAMINOPHEN 325 MG PO TABS
650.0000 mg | ORAL_TABLET | Freq: Four times a day (QID) | ORAL | Status: DC | PRN
  Administered 2016-07-23: 650 mg via ORAL
  Filled 2016-07-21: qty 2

## 2016-07-21 MED ORDER — ONDANSETRON HCL 4 MG/2ML IJ SOLN: 4.0000 mg | Freq: Four times a day (QID) | INTRAMUSCULAR | Status: DC | PRN

## 2016-07-21 MED ORDER — VITAMIN C 500 MG PO TABS
500.0000 mg | ORAL_TABLET | Freq: Every day | ORAL | Status: DC
  Administered 2016-07-22 – 2016-07-23 (×2): 500 mg via ORAL
  Filled 2016-07-21 (×2): qty 1

## 2016-07-21 MED ORDER — BRIMONIDINE TARTRATE 0.15 % OP SOLN
1.0000 [drp] | Freq: Three times a day (TID) | OPHTHALMIC | Status: DC
  Administered 2016-07-21 – 2016-07-23 (×6): 1 [drp] via OPHTHALMIC
  Filled 2016-07-21: qty 5

## 2016-07-21 MED ORDER — PROPOFOL 10 MG/ML IV BOLUS
INTRAVENOUS | Status: DC | PRN
  Administered 2016-07-21: 20 mg via INTRAVENOUS

## 2016-07-21 MED ORDER — DIPHENHYDRAMINE HCL 12.5 MG/5ML PO ELIX: 12.5000 mg | ORAL_SOLUTION | ORAL | Status: DC | PRN

## 2016-07-21 MED ORDER — NEOMYCIN-POLYMYXIN B GU 40-200000 IR SOLN
Status: DC | PRN
  Administered 2016-07-21: 4 mL

## 2016-07-21 MED ORDER — DEXTROSE 5 % IV SOLN
500.0000 mg | Freq: Four times a day (QID) | INTRAVENOUS | Status: DC | PRN
  Filled 2016-07-21: qty 5

## 2016-07-21 MED ORDER — ZOLPIDEM TARTRATE 5 MG PO TABS: 5.0000 mg | ORAL_TABLET | Freq: Every evening | ORAL | Status: DC | PRN

## 2016-07-21 MED ORDER — ACETAMINOPHEN 650 MG RE SUPP: 650.0000 mg | Freq: Four times a day (QID) | RECTAL | Status: DC | PRN

## 2016-07-21 MED ORDER — HYDROCHLOROTHIAZIDE 25 MG PO TABS
12.5000 mg | ORAL_TABLET | Freq: Every day | ORAL | Status: DC
  Administered 2016-07-22: 12.5 mg via ORAL
  Filled 2016-07-21: qty 1

## 2016-07-21 MED ORDER — EPINEPHRINE PF 1 MG/ML IJ SOLN
INTRAMUSCULAR | Status: AC
  Filled 2016-07-21: qty 1

## 2016-07-21 MED ORDER — OXYCODONE HCL 5 MG PO TABS
5.0000 mg | ORAL_TABLET | ORAL | Status: DC | PRN
  Administered 2016-07-21 (×2): 5 mg via ORAL
  Administered 2016-07-21 – 2016-07-22 (×4): 10 mg via ORAL
  Administered 2016-07-23: 5 mg via ORAL
  Filled 2016-07-21: qty 2
  Filled 2016-07-21: qty 1
  Filled 2016-07-21 (×2): qty 2
  Filled 2016-07-21 (×2): qty 1
  Filled 2016-07-21: qty 2

## 2016-07-21 MED ORDER — IPRATROPIUM-ALBUTEROL 0.5-2.5 (3) MG/3ML IN SOLN: 3.0000 mL | Freq: Four times a day (QID) | RESPIRATORY_TRACT | Status: DC | PRN

## 2016-07-21 MED ORDER — ALBUTEROL SULFATE (2.5 MG/3ML) 0.083% IN NEBU: 3.0000 mL | INHALATION_SOLUTION | Freq: Four times a day (QID) | RESPIRATORY_TRACT | Status: DC | PRN

## 2016-07-21 MED ORDER — FAMOTIDINE 20 MG PO TABS
20.0000 mg | ORAL_TABLET | Freq: Once | ORAL | Status: AC
  Administered 2016-07-21: 20 mg via ORAL

## 2016-07-21 MED ORDER — VITAMIN B-12 1000 MCG PO TABS
1000.0000 ug | ORAL_TABLET | Freq: Every day | ORAL | Status: DC
  Administered 2016-07-22 – 2016-07-23 (×2): 1000 ug via ORAL
  Filled 2016-07-21 (×2): qty 1

## 2016-07-21 MED ORDER — MAGNESIUM HYDROXIDE 400 MG/5ML PO SUSP
30.0000 mL | Freq: Every day | ORAL | Status: DC | PRN
  Administered 2016-07-22 – 2016-07-23 (×2): 30 mL via ORAL
  Filled 2016-07-21 (×2): qty 30

## 2016-07-21 MED ORDER — BUPIVACAINE-EPINEPHRINE (PF) 0.25% -1:200000 IJ SOLN
INTRAMUSCULAR | Status: AC
  Filled 2016-07-21: qty 30

## 2016-07-21 MED ORDER — CLINDAMYCIN PHOSPHATE 900 MG/50ML IV SOLN
INTRAVENOUS | Status: AC
  Filled 2016-07-21: qty 50

## 2016-07-21 MED ORDER — BUPIVACAINE-EPINEPHRINE 0.25% -1:200000 IJ SOLN
INTRAMUSCULAR | Status: DC | PRN
  Administered 2016-07-21: 30 mL

## 2016-07-21 MED ORDER — MOMETASONE FURO-FORMOTEROL FUM 200-5 MCG/ACT IN AERO
2.0000 | INHALATION_SPRAY | Freq: Two times a day (BID) | RESPIRATORY_TRACT | Status: DC
  Administered 2016-07-21 – 2016-07-23 (×4): 2 via RESPIRATORY_TRACT
  Filled 2016-07-21: qty 8.8

## 2016-07-21 MED ORDER — GLYCOPYRROLATE 0.2 MG/ML IJ SOLN
INTRAMUSCULAR | Status: DC | PRN
  Administered 2016-07-21: 0.2 mg via INTRAVENOUS

## 2016-07-21 MED ORDER — METHOCARBAMOL 500 MG PO TABS
500.0000 mg | ORAL_TABLET | Freq: Four times a day (QID) | ORAL | Status: DC | PRN
  Administered 2016-07-22 – 2016-07-23 (×2): 500 mg via ORAL
  Filled 2016-07-21 (×2): qty 1

## 2016-07-21 MED ORDER — FLUTICASONE PROPIONATE 50 MCG/ACT NA SUSP
1.0000 | Freq: Two times a day (BID) | NASAL | Status: DC
  Administered 2016-07-21 – 2016-07-23 (×4): 1 via NASAL
  Filled 2016-07-21: qty 16

## 2016-07-21 MED ORDER — MONTELUKAST SODIUM 10 MG PO TABS
10.0000 mg | ORAL_TABLET | Freq: Every evening | ORAL | Status: DC
  Administered 2016-07-22: 10 mg via ORAL
  Filled 2016-07-21: qty 1

## 2016-07-21 MED ORDER — BISACODYL 10 MG RE SUPP
10.0000 mg | Freq: Every day | RECTAL | Status: DC | PRN
  Administered 2016-07-23: 10 mg via RECTAL
  Filled 2016-07-21: qty 1

## 2016-07-21 MED ORDER — SODIUM CHLORIDE 0.9 % IV SOLN
INTRAVENOUS | Status: DC
  Administered 2016-07-21 (×2): via INTRAVENOUS

## 2016-07-21 MED ORDER — FENTANYL CITRATE (PF) 100 MCG/2ML IJ SOLN
INTRAMUSCULAR | Status: DC | PRN
  Administered 2016-07-21: 100 ug via INTRAVENOUS

## 2016-07-21 MED ORDER — DOCUSATE SODIUM 100 MG PO CAPS
100.0000 mg | ORAL_CAPSULE | Freq: Two times a day (BID) | ORAL | Status: DC
  Administered 2016-07-21 – 2016-07-23 (×4): 100 mg via ORAL
  Filled 2016-07-21 (×4): qty 1

## 2016-07-21 MED ORDER — FAMOTIDINE 20 MG PO TABS
ORAL_TABLET | ORAL | Status: AC
  Administered 2016-07-21: 20 mg via ORAL
  Filled 2016-07-21: qty 1

## 2016-07-21 MED ORDER — METOCLOPRAMIDE HCL 5 MG/ML IJ SOLN: 5.0000 mg | Freq: Three times a day (TID) | INTRAMUSCULAR | Status: DC | PRN

## 2016-07-21 MED ORDER — PROPOFOL 500 MG/50ML IV EMUL
INTRAVENOUS | Status: DC | PRN
  Administered 2016-07-21: 60 ug/kg/min via INTRAVENOUS

## 2016-07-21 MED ORDER — SODIUM CHLORIDE 0.9 % IV SOLN
INTRAVENOUS | Status: DC | PRN
  Administered 2016-07-21: 30 ug/min via INTRAVENOUS

## 2016-07-21 MED ORDER — INSULIN ASPART 100 UNIT/ML ~~LOC~~ SOLN
0.0000 [IU] | Freq: Three times a day (TID) | SUBCUTANEOUS | Status: DC
  Administered 2016-07-22: 3 [IU] via SUBCUTANEOUS
  Administered 2016-07-22: 2 [IU] via SUBCUTANEOUS
  Administered 2016-07-22 – 2016-07-23 (×3): 3 [IU] via SUBCUTANEOUS
  Filled 2016-07-21 (×5): qty 1

## 2016-07-21 MED ORDER — ISOSORBIDE MONONITRATE ER 30 MG PO TB24
180.0000 mg | ORAL_TABLET | Freq: Every day | ORAL | Status: DC
  Administered 2016-07-22 – 2016-07-23 (×2): 180 mg via ORAL
  Filled 2016-07-21 (×2): qty 6

## 2016-07-21 MED ORDER — GLIPIZIDE ER 10 MG PO TB24
10.0000 mg | ORAL_TABLET | Freq: Two times a day (BID) | ORAL | Status: DC
  Administered 2016-07-22 – 2016-07-23 (×3): 10 mg via ORAL
  Filled 2016-07-21 (×4): qty 1

## 2016-07-21 MED ORDER — LORATADINE 10 MG PO TABS
10.0000 mg | ORAL_TABLET | Freq: Every day | ORAL | Status: DC
  Administered 2016-07-22 – 2016-07-23 (×2): 10 mg via ORAL
  Filled 2016-07-21 (×2): qty 1

## 2016-07-21 MED ORDER — LORATADINE 10 MG PO TABS: 10.0000 mg | ORAL_TABLET | Freq: Every day | ORAL | Status: DC

## 2016-07-21 MED ORDER — BUPIVACAINE HCL (PF) 0.5 % IJ SOLN
INTRAMUSCULAR | Status: DC | PRN
  Administered 2016-07-21: 3 mL

## 2016-07-21 MED ORDER — FENTANYL CITRATE (PF) 100 MCG/2ML IJ SOLN
INTRAMUSCULAR | Status: AC
  Filled 2016-07-21: qty 2

## 2016-07-21 MED ORDER — FEBUXOSTAT 40 MG PO TABS
40.0000 mg | ORAL_TABLET | Freq: Every day | ORAL | Status: DC
  Administered 2016-07-22 – 2016-07-23 (×2): 40 mg via ORAL
  Filled 2016-07-21 (×3): qty 1

## 2016-07-21 MED ORDER — METFORMIN HCL 500 MG PO TABS: 500.0000 mg | ORAL_TABLET | Freq: Two times a day (BID) | ORAL | Status: DC

## 2016-07-21 MED ORDER — GLYCOPYRROLATE 0.2 MG/ML IJ SOLN
INTRAMUSCULAR | Status: AC
  Filled 2016-07-21: qty 1

## 2016-07-21 MED ORDER — EPHEDRINE SULFATE 50 MG/ML IJ SOLN
INTRAMUSCULAR | Status: DC | PRN
  Administered 2016-07-21: 10 mg via INTRAVENOUS

## 2016-07-21 MED ORDER — METOCLOPRAMIDE HCL 10 MG PO TABS: 5.0000 mg | ORAL_TABLET | Freq: Three times a day (TID) | ORAL | Status: DC | PRN

## 2016-07-21 MED ORDER — LIDOCAINE HCL (PF) 2 % IJ SOLN
INTRAMUSCULAR | Status: AC
  Filled 2016-07-21: qty 2

## 2016-07-21 MED ORDER — SODIUM CHLORIDE 0.9 % IV SOLN
INTRAVENOUS | Status: DC
  Administered 2016-07-21 (×2): via INTRAVENOUS

## 2016-07-21 MED ORDER — PHENOL 1.4 % MT LIQD
1.0000 | OROMUCOSAL | Status: DC | PRN
  Filled 2016-07-21: qty 177

## 2016-07-21 MED ORDER — TRANEXAMIC ACID 1000 MG/10ML IV SOLN
INTRAVENOUS | Status: AC
  Filled 2016-07-21: qty 10

## 2016-07-21 MED ORDER — NEOMYCIN-POLYMYXIN B GU 40-200000 IR SOLN
Status: AC
  Filled 2016-07-21: qty 4

## 2016-07-21 MED ORDER — FLUTICASONE PROPIONATE 50 MCG/ACT NA SUSP: 1.0000 | Freq: Every day | NASAL | Status: DC

## 2016-07-21 SURGICAL SUPPLY — 52 items
BLADE SAW SAG 18.5X105 (BLADE) ×2 IMPLANT
BNDG COHESIVE 6X5 TAN STRL LF (GAUZE/BANDAGES/DRESSINGS) ×6 IMPLANT
CANISTER SUCT 1200ML W/VALVE (MISCELLANEOUS) ×2 IMPLANT
CAPT HIP TOTAL 3 ×1 IMPLANT
CATH FOL LEG HOLDER (MISCELLANEOUS) ×2 IMPLANT
CATH TRAY METER 16FR LF (MISCELLANEOUS) ×2 IMPLANT
CHLORAPREP W/TINT 26ML (MISCELLANEOUS) ×2 IMPLANT
DRAPE C-ARM XRAY 36X54 (DRAPES) ×2 IMPLANT
DRAPE INCISE IOBAN 66X60 STRL (DRAPES) IMPLANT
DRAPE POUCH INSTRU U-SHP 10X18 (DRAPES) ×2 IMPLANT
DRAPE SHEET LG 3/4 BI-LAMINATE (DRAPES) ×6 IMPLANT
DRAPE TABLE BACK 80X90 (DRAPES) ×2 IMPLANT
DRESSING SURGICEL FIBRLLR 1X2 (HEMOSTASIS) ×2 IMPLANT
DRSG OPSITE POSTOP 4X8 (GAUZE/BANDAGES/DRESSINGS) ×2 IMPLANT
DRSG SURGICEL FIBRILLAR 1X2 (HEMOSTASIS) ×4
ELECT BLADE 6.5 EXT (BLADE) ×2 IMPLANT
ELECT REM PT RETURN 9FT ADLT (ELECTROSURGICAL) ×2
ELECTRODE REM PT RTRN 9FT ADLT (ELECTROSURGICAL) ×1 IMPLANT
GLOVE BIOGEL PI IND STRL 9 (GLOVE) ×1 IMPLANT
GLOVE BIOGEL PI INDICATOR 9 (GLOVE) ×1
GLOVE SURG SYN 9.0  PF PI (GLOVE) ×2
GLOVE SURG SYN 9.0 PF PI (GLOVE) ×2 IMPLANT
GOWN SRG 2XL LVL 4 RGLN SLV (GOWNS) ×1 IMPLANT
GOWN STRL NON-REIN 2XL LVL4 (GOWNS) ×2
GOWN STRL REUS W/ TWL LRG LVL3 (GOWN DISPOSABLE) ×1 IMPLANT
GOWN STRL REUS W/TWL LRG LVL3 (GOWN DISPOSABLE) ×2
HEMOVAC 400CC 10FR (MISCELLANEOUS) IMPLANT
HOOD PEEL AWAY FLYTE STAYCOOL (MISCELLANEOUS) ×2 IMPLANT
KIT PREVENA INCISION MGT 13 (CANNISTER) ×2 IMPLANT
MAT BLUE FLOOR 46X72 FLO (MISCELLANEOUS) ×2 IMPLANT
NDL SAFETY 18GX1.5 (NEEDLE) ×2 IMPLANT
NDL SPNL 18GX3.5 QUINCKE PK (NEEDLE) ×1 IMPLANT
NEEDLE SPNL 18GX3.5 QUINCKE PK (NEEDLE) ×2 IMPLANT
NS IRRIG 1000ML POUR BTL (IV SOLUTION) ×2 IMPLANT
PACK HIP COMPR (MISCELLANEOUS) ×2 IMPLANT
SOL PREP PVP 2OZ (MISCELLANEOUS) ×2
SOLUTION PREP PVP 2OZ (MISCELLANEOUS) ×1 IMPLANT
SPONGE DRAIN TRACH 4X4 STRL 2S (GAUZE/BANDAGES/DRESSINGS) ×1 IMPLANT
STAPLER SKIN PROX 35W (STAPLE) ×2 IMPLANT
STRAP SAFETY BODY (MISCELLANEOUS) ×2 IMPLANT
SUT DVC 2 QUILL PDO  T11 36X36 (SUTURE) ×1
SUT DVC 2 QUILL PDO T11 36X36 (SUTURE) ×1 IMPLANT
SUT SILK 0 (SUTURE) ×2
SUT SILK 0 30XBRD TIE 6 (SUTURE) ×1 IMPLANT
SUT V-LOC 90 ABS DVC 3-0 CL (SUTURE) ×2 IMPLANT
SUT VIC AB 1 CT1 36 (SUTURE) ×2 IMPLANT
SYR 20CC LL (SYRINGE) ×2 IMPLANT
SYR 30ML LL (SYRINGE) ×2 IMPLANT
TAPE MICROFOAM 4IN (TAPE) ×1 IMPLANT
TOWEL OR 17X26 4PK STRL BLUE (TOWEL DISPOSABLE) ×2 IMPLANT
WATER STERILE IRR 1000ML POUR (IV SOLUTION) ×1 IMPLANT
WND VAC CANISTER 500ML (MISCELLANEOUS) ×2 IMPLANT

## 2016-07-21 NOTE — Progress Notes (Signed)
Admission: Patient alert and oriented. Decreased sensation on lower extremities. Vital signs stable. Patient alert to room and call bell system. Foot pumps and TEDS in place. Admission and skin assessment completed.   Wynema Birch, RN

## 2016-07-21 NOTE — Anesthesia Procedure Notes (Addendum)
Spinal  Patient location during procedure: OR Start time: 07/21/2016 1:55 PM End time: 07/21/2016 2:08 PM Staffing Anesthesiologist: Alvin Critchley Resident/CRNA: Rosaria Ferries, DAVID Performed: anesthesiologist and resident/CRNA  Preanesthetic Checklist Completed: patient identified, site marked, surgical consent, pre-op evaluation, timeout performed, IV checked, risks and benefits discussed and monitors and equipment checked Spinal Block Patient position: sitting Prep: Betadine Patient monitoring: heart rate, continuous pulse ox, blood pressure and cardiac monitor Approach: midline Location: L4-5 Injection technique: single-shot Needle Needle type: Introducer and Pencan  Needle gauge: 24 G Needle length: 9 cm Assessment Sensory level: T8 Additional Notes Negative paresthesia. Negative blood return. Positive free-flowing CSF. Expiration date of kit checked and confirmed. Patient tolerated procedure well, without complications.

## 2016-07-21 NOTE — Anesthesia Post-op Follow-up Note (Cosign Needed)
Anesthesia QCDR form completed.        

## 2016-07-21 NOTE — Op Note (Signed)
07/21/2016  3:43 PM  PATIENT:  Mackenzie Hill  76 y.o. female  PRE-OPERATIVE DIAGNOSIS:  PRIMARY OSTEOARTHRITIS right hip  POST-OPERATIVE DIAGNOSIS:  PRIMARY OSTEOARTHRITIS right hip  PROCEDURE:  Procedure(s): TOTAL HIP ARTHROPLASTY ANTERIOR APPROACH (Right)  SURGEON: Laurene Footman, MD  ASSISTANTS: None  ANESTHESIA:   spinal  EBL:  Total I/O In: 1000 [I.V.:1000] Out: 600 [Urine:200; Blood:400]  BLOOD ADMINISTERED:none  DRAINS: none   LOCAL MEDICATIONS USED:  MARCAINE     SPECIMEN:  Source of Specimen:  Right femoral head  DISPOSITION OF SPECIMEN:  PATHOLOGY  COUNTS:  YES  TOURNIQUET:  * No tourniquets in log *  IMPLANTS: Medacta AMIS 3 standard stem with 52 mm Mpact DM cup and liner with M 28 mm metal head  DICTATION: .Dragon Dictation   The patient was brought to the operating room and after spinal anesthesia was obtained patient was placed on the operative table with the ipsilateral foot into the Medacta attachment, contralateral leg on a well-padded table. C-arm was brought in and preop template x-ray taken. After prepping and draping in usual sterile fashion appropriate patient identification and timeout procedures were completed. Anterior approach to the hip was obtained and centered over the greater trochanter and TFL muscle. The subcutaneous tissue was incised hemostasis being achieved by electrocautery. TFL fascia was incised and the muscle retracted laterally deep retractor placed. The lateral femoral circumflex vessels were identified and ligated. The anterior capsule was exposed and a capsulotomy performed. The neck was identified and a femoral neck cut carried out with a saw. The head was removed without difficulty and showed sclerotic femoral head and acetabulum. Reaming was carried out to 50 mm and a 52 mm cup trial gave appropriate tightness to the acetabular component a 52 DM cup was impacted into position. The leg was then externally rotated and ischiofemoral  and pubofemoral releases carried out. The femur was sequentially broached to a size 3, size 3 standard stem with an L head trials were placed and the final components chosen. The 3 standard stem was inserted along with a M 28 mm head and 52 mm liner. The hip was reduced and was stable the wound was thoroughly irrigated with a dilute Betadine solution. The deep fascia view. Using a heavy Quill after infiltration of 30 cc of quarter percent Sensorcaine with epinephrine, TXA was also infiltrated into the joint.Marland Kitchen 3-0 v-loc subcutaneous closure followed by skin staples and incisional wound VAC  PLAN OF CARE: Admit to inpatient

## 2016-07-21 NOTE — H&P (Signed)
Reviewed paper H+P, will be scanned into chart. Patient examined No changes noted.  

## 2016-07-21 NOTE — Anesthesia Preprocedure Evaluation (Addendum)
Anesthesia Evaluation  Patient identified by MRN, date of birth, ID band Patient awake    Reviewed: Allergy & Precautions, NPO status , Patient's Chart, lab work & pertinent test results  Airway Mallampati: III  TM Distance: <3 FB     Dental  (+) Chipped   Pulmonary shortness of breath and with exertion, sleep apnea , COPD,  COPD inhaler, former smoker,    Pulmonary exam normal        Cardiovascular hypertension, Pt. on medications +CHF  Normal cardiovascular exam     Neuro/Psych negative neurological ROS  negative psych ROS   GI/Hepatic Neg liver ROS,   Endo/Other  diabetes, Well Controlled, Type 2, Oral Hypoglycemic Agents  Renal/GU Renal InsufficiencyRenal disease  negative genitourinary   Musculoskeletal  (+) Arthritis , Osteoarthritis,    Abdominal (+) + obese,   Peds negative pediatric ROS (+)  Hematology  (+) anemia ,   Anesthesia Other Findings Past Medical History: No date: Anemia No date: Arthritis No date: Cataract     Comment: Bilateral No date: CHF (congestive heart failure) (HCC) No date: Chronic kidney disease No date: COPD (chronic obstructive pulmonary disease) (* No date: DDD (degenerative disc disease), lumbar No date: Diabetes mellitus without complication (HCC) No date: Dyspnea     Comment: with exertion No date: Gout No date: Hypertension No date: Sarcoidosis, lung (HCC)     Comment: reported by pt No date: Sleep apnea     Comment: OSA--Use C-PAP  Reproductive/Obstetrics                            Anesthesia Physical Anesthesia Plan  ASA: III  Anesthesia Plan: Spinal   Post-op Pain Management:    Induction: Intravenous  PONV Risk Score and Plan:   Airway Management Planned: Nasal Cannula  Additional Equipment:   Intra-op Plan:   Post-operative Plan:   Informed Consent: I have reviewed the patients History and Physical, chart, labs and  discussed the procedure including the risks, benefits and alternatives for the proposed anesthesia with the patient or authorized representative who has indicated his/her understanding and acceptance.   Dental advisory given  Plan Discussed with: CRNA and Surgeon  Anesthesia Plan Comments:         Anesthesia Quick Evaluation

## 2016-07-21 NOTE — Transfer of Care (Addendum)
Immediate Anesthesia Transfer of Care Note  Patient: Mackenzie Hill  Procedure(s) Performed: Procedure(s): TOTAL HIP ARTHROPLASTY ANTERIOR APPROACH (Right)  Patient Location: PACU  Anesthesia Type:Spinal  Level of Consciousness: sedated  Airway & Oxygen Therapy: Patient Spontanous Breathing and Patient connected to nasal cannula oxygen  Post-op Assessment: Report given to RN and Post -op Vital signs reviewed and stable  Post vital signs: Reviewed and stable  Last Vitals:  Vitals:   07/21/16 1635 07/21/16 1657  BP: 138/72 (!) 125/50  Pulse: 62 62  Resp: 19 17  Temp: 36.9 C (!) 36.3 C    Last Pain:  Vitals:   07/21/16 1657  TempSrc: Oral  PainSc:          Complications: No apparent anesthesia complications

## 2016-07-22 ENCOUNTER — Encounter: Payer: Self-pay | Admitting: Orthopedic Surgery

## 2016-07-22 LAB — BASIC METABOLIC PANEL
Anion gap: 5 (ref 5–15)
BUN: 23 mg/dL — AB (ref 6–20)
CALCIUM: 8.5 mg/dL — AB (ref 8.9–10.3)
CO2: 22 mmol/L (ref 22–32)
CREATININE: 1.75 mg/dL — AB (ref 0.44–1.00)
Chloride: 111 mmol/L (ref 101–111)
GFR calc Af Amer: 32 mL/min — ABNORMAL LOW (ref >60)
GFR, EST NON AFRICAN AMERICAN: 27 mL/min — AB (ref >60)
GLUCOSE: 145 mg/dL — AB (ref 65–99)
POTASSIUM: 5 mmol/L (ref 3.5–5.1)
SODIUM: 138 mmol/L (ref 135–145)

## 2016-07-22 LAB — GLUCOSE, CAPILLARY
GLUCOSE-CAPILLARY: 131 mg/dL — AB (ref 65–99)
GLUCOSE-CAPILLARY: 170 mg/dL — AB (ref 65–99)
Glucose-Capillary: 164 mg/dL — ABNORMAL HIGH (ref 65–99)
Glucose-Capillary: 138 mg/dL — ABNORMAL HIGH (ref 65–99)

## 2016-07-22 LAB — CBC
HCT: 28.9 % — ABNORMAL LOW (ref 35.0–47.0)
Hemoglobin: 9.7 g/dL — ABNORMAL LOW (ref 12.0–16.0)
MCH: 30.1 pg (ref 26.0–34.0)
MCHC: 33.5 g/dL (ref 32.0–36.0)
MCV: 89.9 fL (ref 80.0–100.0)
PLATELETS: 272 10*3/uL (ref 150–440)
RBC: 3.21 MIL/uL — ABNORMAL LOW (ref 3.80–5.20)
RDW: 13.1 % (ref 11.5–14.5)
WBC: 7.2 10*3/uL (ref 3.6–11.0)

## 2016-07-22 NOTE — Clinical Social Work Placement (Addendum)
   CLINICAL SOCIAL WORK PLACEMENT  NOTE  Date:  07/22/2016  Patient Details  Name: Mackenzie Hill MRN: [id number] Date of Birth: Jan 14, 1940  Clinical Social Work is seeking post-discharge placement for this patient at the [place] level of care (*CSW will initial, date and re-position this form in  chart as items are completed):  Yes   Patient/family provided with [place] Work Department's list of facilities offering this level of care within the geographic area requested by the patient (or if unable, by the patient's family).  Yes   Patient/family informed of their freedom to choose among providers that offer the needed level of care, that participate in Medicare, Medicaid or managed care program needed by the patient, have an available bed and are willing to accept the patient.  Yes   Patient/family informed of [facility]'s ownership interest in [facility] and [facility], as well as of the fact that they are under no obligation to receive care at these facilities.  PASRR submitted to EDS on 07/22/16     PASRR number received on 07/22/16     Existing PASRR number confirmed on       FL2 transmitted to all facilities in geographic area requested by pt/family on 07/22/16     FL2 transmitted to all facilities within larger geographic area on       Patient informed that his/her managed care company has contracts with or will negotiate with certain facilities, including the following:        Yes   Patient/family informed of bed offers received.  Patient chooses bed at  [facility] )     Physician recommends and patient chooses bed at      Patient to be transferred to  [facility] on  07-23-16. (updated 07-23-16 Evette Cristal, MSW, [place])  Patient to be transferred to facility by  [facility] (updated 07-23-16 Evette Cristal, MSW, LCSWA)    Patient family notified on  07-23-16 of transfer. (updated 07-23-16 Evette Cristal,  MSW, [place])  Name of family member notified:   Alric Seton niece [phone] (updated 07-23-16 Evette Cristal, MSW, [place])   PHYSICIAN       Additional Comment:    _______________________________________________ Sample, Veronia Beets, LCSW 07/22/2016, 11:53 AM   Jones Broom. Norval Morton, MSW, [place]  07/23/2016 4:39 PM

## 2016-07-22 NOTE — Care Management (Signed)
RNCM consult for discharge planning. POD # 1 right THA. Pt pending. Will follow progression and assist with discharge needs.

## 2016-07-22 NOTE — Plan of Care (Signed)
Problem: Safety: Goal: Ability to remain free from injury will improve Outcome: Progressing Pt is alert and oriented and rings bell for assistance.  Problem: Pain Managment: Goal: General experience of comfort will improve Outcome: Progressing Pt pain is control with oral pain medication this shift.  Problem: Skin Integrity: Goal: Risk for impaired skin integrity will decrease Outcome: Progressing Surgical dressing is dry and intact.  Problem: Nutrition: Goal: Adequate nutrition will be maintained Outcome: Progressing Pt is eating and drinking without difficulty.

## 2016-07-22 NOTE — Progress Notes (Signed)
   Subjective: 1 Day Post-Op Procedure(s) (LRB): TOTAL HIP ARTHROPLASTY ANTERIOR APPROACH (Right) Patient reports pain as mild.   Patient is well, and has had no acute complaints or problems Denies any CP, SOB, ABD pain. We will start therapy today.    Objective: Vital signs in last 24 hours: Temp:  [97.4 F (36.3 C)-98.9 F (37.2 C)] 97.6 F (36.4 C) (07/11 0716) Pulse Rate:  [60-75] 66 (07/11 0716) Resp:  [16-21] 19 (07/11 0406) BP: (104-167)/(50-76) 123/60 (07/11 0716) SpO2:  [96 %-100 %] 97 % (07/11 0716) Weight:  [107.5 kg (237 lb)] 107.5 kg (237 lb) (07/10 1116)  Intake/Output from previous day: 07/10 0701 - 07/11 0700 In: 2250 [I.V.:2150; IV Piggyback:100] Out: 1350 [Urine:950; Blood:400] Intake/Output this shift: No intake/output data recorded.   Recent Labs  07/21/16 1752 07/22/16 0425  HGB 10.6* 9.7*    Recent Labs  07/21/16 1752 07/22/16 0425  WBC 5.6 7.2  RBC 3.40* 3.21*  HCT 31.0* 28.9*  PLT 286 272    Recent Labs  07/21/16 1752 07/22/16 0425  NA  --  138  K  --  5.0  CL  --  111  CO2  --  22  BUN  --  23*  CREATININE 1.63* 1.75*  GLUCOSE  --  145*  CALCIUM  --  8.5*   No results for input(s): LABPT, INR in the last 72 hours.  EXAM General - Patient is Alert, Appropriate and Oriented Extremity - Neurovascular intact Sensation intact distally Intact pulses distally Dorsiflexion/Plantar flexion intact Dressing - dressing C/D/I and no drainage, wound vac intact Motor Function - intact, moving foot and toes well on exam.   Past Medical History:  Diagnosis Date  . Anemia   . Arthritis   . Cataract    Bilateral  . CHF (congestive heart failure) ([place])   . Chronic kidney disease   . COPD (chronic obstructive pulmonary disease) ([place])   . DDD (degenerative disc disease), lumbar   . Diabetes mellitus without complication ([place])   . Dyspnea    with exertion  . Gout   . Hypertension   . Sarcoidosis, lung ([place])    reported by pt  .  Sleep apnea    OSA--Use C-PAP    Assessment/Plan:   1 Day Post-Op Procedure(s) (LRB): TOTAL HIP ARTHROPLASTY ANTERIOR APPROACH (Right) Active Problems:   Primary localized osteoarthritis of right hip  Estimated body mass index is 39.44 kg/m as calculated from the following:   Height as of this encounter: 5\' 5"  (1.651 m).   Weight as of this encounter: 107.5 kg (237 lb). Advance diet Up with therapy  Needs BM Recheck labs in the am CM to assist with discharge  DVT Prophylaxis - Lovenox, Foot Pumps and TED hose Weight-Bearing as tolerated to right leg   T. Rachelle Hora, PA-C [place] 07/22/2016, 7:58 AM

## 2016-07-22 NOTE — Evaluation (Signed)
Physical Therapy Evaluation Patient Details Name: Mackenzie Hill MRN: [id number] DOB: [birth year] Today's Date: 07/22/2016   History of Present Illness  Pt is a 76 y/o F s/p R THA.  Pt's PMH includes sarcoidosis, gout, lumbar DDD, COPD, CHF.  Clinical Impression  Pt is s/p R THA resulting in the deficits listed below (see PT Problem List). [name] presents with RLE weakness and stiffness but is agreeable to therapy.  She requires max increased time and effort with min assist for bed mobility.  She requires mod assist to boost to standing after several unsuccessful attempts to stand. Ambulatory distance limited by difficulty with R foot clearance, fatigue, and R hip pain.  Pt will benefit from skilled PT to increase their independence and safety with mobility to allow discharge to the venue listed below.     Follow Up Recommendations SNF    Equipment Recommendations  Rolling walker with 5" wheels    Recommendations for Other Services       Precautions / Restrictions Precautions Precautions: Fall Precaution Comments: Chair follow as pt fatigues quickly.  Direct anterior approach, no hip precautions. Restrictions Weight Bearing Restrictions: Yes RLE Weight Bearing: Weight bearing as tolerated      Mobility  Bed Mobility Overal bed mobility: Needs Assistance Bed Mobility: Supine to Sit     Supine to sit: Min assist;HOB elevated     General bed mobility comments: HOB elevated and heavy use of bed rail.  Cues provided for sequencing and technique.  Pt requires min assist to elevate trunk and requires max increased time and effort to scoot to EOB once sitting.  Transfers Overall transfer level: Needs assistance Equipment used: Rolling walker (2 wheeled) Transfers: Sit to/from Stand Sit to Stand: Mod assist;From elevated surface         General transfer comment: Cues to scoot to EOB prior to standing and to flex Bil knees for improved mechanical advantage.  Pt requires  several attempt to achieve standing.  Mod assist to boost up to standing.  Cues for proper hand placement.  Pt demonstrates poor control of descent to chair.  Ambulation/Gait Ambulation/Gait assistance: Min assist;Min guard Ambulation Distance (Feet): 10 Feet Assistive device: Rolling walker (2 wheeled) Gait Pattern/deviations: Step-to pattern;Decreased stride length;Decreased weight shift to right;Decreased stance time - right;Decreased step length - left;Antalgic;Trunk flexed;Wide base of support Gait velocity: very decreased Gait velocity interpretation: <1.8 ft/sec, indicative of risk for recurrent falls General Gait Details: Cues for upright posture and for proper sequencing using RW.  Pt initially unable to pick R foot up from floor despite cues and tactile cues and requires min assist to achieve foot clearance.  As pt continues on she demonstrates a combination of crawling her R foot and lifting her R foot from the floor.  Pt with very decreased gait speed and fatigues quickly.  Stairs            Wheelchair Mobility    Modified Rankin (Stroke Patients Only)       Balance Overall balance assessment: Needs assistance Sitting-balance support: No upper extremity supported;Feet supported Sitting balance-Leahy Scale: Poor Sitting balance - Comments: Pt relies on having at least 1UE supported when seated EOB.   Standing balance support: Bilateral upper extremity supported;During functional activity Standing balance-Leahy Scale: Poor Standing balance comment: Relies on BUE support for static and dynamic activities.  Pertinent Vitals/Pain Pain Assessment: 0-10 Pain Score: 8  Pain Location: R hip Pain Descriptors / Indicators: Aching;Guarding Pain Intervention(s): Limited activity within patient's tolerance;Monitored during session;Repositioned    Home Living Family/patient expects to be discharged to:: Skilled nursing facility Living  Arrangements: Children Available Help at Discharge: Family Type of Home: House Home Access: Stairs to enter Entrance Stairs-Rails: None Entrance Stairs-Number of Steps: 2 (1, 1) Home Layout: One level Home Equipment: Grab bars - tub/shower;Hand held shower head Additional Comments: Pt was originally planning on going home with daughter at d/c so information above is for daughter's home layout.  Daughter has R knee injury and is currently wearing R KI and ambulating with SPC so she will be limited in her physical assist she can provide to pt.      Prior Function Level of Independence: Independent         Comments: Pt ambulating without use of AD and reports no falls over the past 6 months.  Independent with bathing, dressing, cooking, cleaning.     Hand Dominance        Extremity/Trunk Assessment   Upper Extremity Assessment Upper Extremity Assessment:  (BUE strength grossly 4/5)    Lower Extremity Assessment Lower Extremity Assessment: RLE deficits/detail RLE Deficits / Details: Strength grossly 3-/5, limited by pain.  Limited ROM as expected s/p surgery documented above. RLE: Unable to fully assess due to pain       Communication   Communication: No difficulties  Cognition Arousal/Alertness: Awake/alert Behavior During Therapy: WFL for tasks assessed/performed Overall Cognitive Status: Within Functional Limits for tasks assessed                                        General Comments General comments (skin integrity, edema, etc.): Daughter present during Evaluation.    Exercises Total Joint Exercises Ankle Circles/Pumps: AROM;Both;10 reps;Supine Quad Sets: Strengthening;Both;10 reps;Supine Long Arc Quad: AROM;Right;10 reps;Seated Knee Flexion: Other (comment) (attempted in standing but pt unable to achieve)   Assessment/Plan    PT Assessment Patient needs continued PT services  PT Problem List Decreased strength;Decreased range of  motion;Decreased activity tolerance;Decreased balance;Decreased mobility;Pain;Decreased knowledge of use of DME;Decreased safety awareness       PT Treatment Interventions DME instruction;Gait training;Stair training;Functional mobility training;Therapeutic activities;Therapeutic exercise;Balance training;Neuromuscular re-education;Patient/family education;Modalities    PT Goals (Current goals can be found in the Care Plan section)  Acute Rehab PT Goals Patient Stated Goal: to get stronger PT Goal Formulation: With patient Time For Goal Achievement: 08/05/16 Potential to Achieve Goals: Fair    Frequency BID   Barriers to discharge Inaccessible home environment;Decreased caregiver support Daughter able to provided limited amount of physical assist    Co-evaluation               AM-PAC PT "6 Clicks" Daily Activity  Outcome Measure Difficulty turning over in bed (including adjusting bedclothes, sheets and blankets)?: Total Difficulty moving from lying on back to sitting on the side of the bed? : Total Difficulty sitting down on and standing up from a chair with arms (e.g., wheelchair, bedside commode, etc,.)?: Total Help needed moving to and from a bed to chair (including a wheelchair)?: A Little Help needed walking in hospital room?: A Little Help needed climbing 3-5 steps with a railing? : A Lot 6 Click Score: 11    End of Session Equipment Utilized During Treatment: Gait belt Activity Tolerance:  Patient limited by fatigue;Patient limited by pain Patient left: in chair;with call bell/phone within reach;with chair alarm set;with family/visitor present Nurse Communication: Mobility status;Weight bearing status PT Visit Diagnosis: Pain;Muscle weakness (generalized) (M62.81);Unsteadiness on feet (R26.81);Difficulty in walking, not elsewhere classified (R26.2) Pain - Right/Left: Right Pain - part of body: Hip    Time: 1012-1057 PT Time Calculation (min) (ACUTE ONLY): 45  min   Charges:   PT Evaluation $PT Eval Low Complexity: 1 Procedure PT Treatments $Gait Training: 8-22 mins $Therapeutic Activity: 8-22 mins   PT G Codes:        Collie Siad PT, DPT 07/22/2016, 11:28 AM

## 2016-07-22 NOTE — NC FL2 (Signed)
[place] LEVEL OF CARE SCREENING TOOL     IDENTIFICATION  Patient Name: Mackenzie Hill Birthdate: [birth year] Sex: female Admission Date (Current Location): 07/21/2016  [place] and [place] Number:  Engineering geologist and Address:  [facility], [address] [place], [place] [address]      Provider Number: [id number]  Attending Physician Name and Address:  Hessie Knows, MD  Relative Name and Phone Number:       Current Level of Care: Hospital Recommended Level of Care: [place] Prior Approval Number:    Date Approved/Denied:   PASRR Number:  [id number])  Discharge Plan: SNF    Current Diagnoses: Patient Active Problem List   Diagnosis Date Noted  . Primary localized osteoarthritis of right hip 07/21/2016    Orientation RESPIRATION BLADDER Height & Weight     Self, Time, Situation, Place  Normal Continent Weight: 237 lb (107.5 kg) Height:  5\' 5"  (165.1 cm)  BEHAVIORAL SYMPTOMS/MOOD NEUROLOGICAL BOWEL NUTRITION STATUS   (none)  (none) Continent Diet (Diet: Carb Modified )  AMBULATORY STATUS COMMUNICATION OF NEEDS Skin   Extensive Assist Verbally Surgical wounds, Wound Vac (Incision: Right Hip, Provena wound vac. )                       Personal Care Assistance Level of Assistance  Bathing, Feeding, Dressing Bathing Assistance: Limited assistance Feeding assistance: Independent Dressing Assistance: Limited assistance     Functional Limitations Info  Sight, Hearing, Speech Sight Info: Adequate Hearing Info: Adequate Speech Info: Adequate    SPECIAL CARE FACTORS FREQUENCY  PT (By licensed PT), OT (By licensed OT)     PT Frequency:  (5) OT Frequency:  (5)            Contractures      Additional Factors Info  Code Status, Allergies Code Status Info:  (Full Code. ) Allergies Info:  (Hydralazine, Meloxicam, Penicillins, Pioglitazone, Tramadol)           Current Medications  (07/22/2016):  This is the current hospital active medication list Current Facility-Administered Medications  Medication Dose Route Frequency Provider Last Rate Last Dose  . 0.9 %  sodium chloride infusion   Intravenous Continuous Hessie Knows, MD 100 mL/hr at 07/21/16 2056    . acetaminophen (TYLENOL) tablet 650 mg  650 mg Oral Q6H PRN Hessie Knows, MD       Or  . acetaminophen (TYLENOL) suppository 650 mg  650 mg Rectal Q6H PRN Hessie Knows, MD      . albuterol (PROVENTIL) (2.5 MG/3ML) 0.083% nebulizer solution 3 mL  3 mL Inhalation Q6H PRN Hessie Knows, MD      . amLODipine (NORVASC) tablet 5 mg  5 mg Oral Daily Hessie Knows, MD      . bisacodyl (DULCOLAX) suppository 10 mg  10 mg Rectal Daily PRN Hessie Knows, MD      . brimonidine (ALPHAGAN) 0.15 % ophthalmic solution 1 drop  1 drop Both Eyes TID Hessie Knows, MD   1 drop at 07/21/16 2055  . clindamycin (CLEOCIN) IVPB 900 mg  900 mg Intravenous Q6H Hessie Knows, MD   Stopped at 07/22/16 0745  . diphenhydrAMINE (BENADRYL) 12.5 MG/5ML elixir 12.5-25 mg  12.5-25 mg Oral Q4H PRN Hessie Knows, MD      . docusate sodium (COLACE) capsule 100 mg  100 mg Oral BID Hessie Knows, MD   100 mg at 07/21/16 2055  . enoxaparin (LOVENOX) injection 40  mg  40 mg Subcutaneous Q24H Hessie Knows, MD   40 mg at 07/22/16 0715  . febuxostat (ULORIC) tablet 40 mg  40 mg Oral Daily Hessie Knows, MD      . fluticasone [facility]) 50 MCG/ACT nasal spray 1 spray  1 spray Each Nare BID Hessie Knows, MD   1 spray at 07/21/16 2056  . glipiZIDE (GLUCOTROL XL) 24 hr tablet 10 mg  10 mg Oral BID Hessie Knows, MD   10 mg at 07/22/16 0715  . hydrochlorothiazide (HYDRODIURIL) tablet 12.5 mg  12.5 mg Oral QHS Hessie Knows, MD      . insulin aspart (novoLOG) injection 0-15 Units  0-15 Units Subcutaneous TID WC Hessie Knows, MD   2 Units at 07/22/16 1610  . ipratropium-albuterol (DUONEB) 0.5-2.5 (3) MG/3ML nebulizer solution 3 mL  3 mL Nebulization Q6H PRN Hessie Knows,  MD      . isosorbide mononitrate (IMDUR) 24 hr tablet 180 mg  180 mg Oral Daily Hessie Knows, MD      . loratadine (CLARITIN) tablet 10 mg  10 mg Oral Daily Hessie Knows, MD      . losartan (COZAAR) tablet 100 mg  100 mg Oral Daily Hessie Knows, MD      . magnesium citrate solution 1 Bottle  1 Bottle Oral Once PRN Hessie Knows, MD      . magnesium hydroxide (MILK OF MAGNESIA) suspension 30 mL  30 mL Oral Daily PRN Hessie Knows, MD      . menthol-cetylpyridinium (CEPACOL) lozenge 3 mg  1 lozenge Oral PRN Hessie Knows, MD       Or  . phenol (CHLORASEPTIC) mouth spray 1 spray  1 spray Mouth/Throat PRN Hessie Knows, MD      . methocarbamol (ROBAXIN) tablet 500 mg  500 mg Oral Q6H PRN Hessie Knows, MD       Or  . methocarbamol (ROBAXIN) 500 mg in dextrose 5 % 50 mL IVPB  500 mg Intravenous Q6H PRN Hessie Knows, MD      . metoCLOPramide (REGLAN) tablet 5-10 mg  5-10 mg Oral Q8H PRN Hessie Knows, MD       Or  . metoCLOPramide (REGLAN) injection 5-10 mg  5-10 mg Intravenous Q8H PRN Hessie Knows, MD      . mometasone-formoterol [facility]) 200-5 MCG/ACT inhaler 2 puff  2 puff Inhalation BID Hessie Knows, MD   2 puff at 07/22/16 0715  . montelukast (SINGULAIR) tablet 10 mg  10 mg Oral QPM Hessie Knows, MD      . morphine 2 MG/ML injection 2 mg  2 mg Intravenous Q1H PRN Hessie Knows, MD      . ondansetron [facility]) tablet 4 mg  4 mg Oral Q6H PRN Hessie Knows, MD       Or  . ondansetron [facility]) injection 4 mg  4 mg Intravenous Q6H PRN Hessie Knows, MD      . oxyCODONE (Oxy IR/ROXICODONE) immediate release tablet 5-10 mg  5-10 mg Oral Q3H PRN Hessie Knows, MD   10 mg at 07/22/16 9604  . spironolactone (ALDACTONE) tablet 25 mg  25 mg Oral Daily Hessie Knows, MD      . vitamin B-12 (CYANOCOBALAMIN) tablet 1,000 mcg  1,000 mcg Oral Daily Hessie Knows, MD      . vitamin C (ASCORBIC ACID) tablet 500 mg  500 mg Oral Daily Hessie Knows, MD      . zolpidem (AMBIEN) tablet 5 mg  5 mg Oral QHS PRN  Hessie Knows,  MD         Discharge Medications: Please see discharge summary for a list of discharge medications.  Relevant Imaging Results:  Relevant Lab Results:   Additional Information  (SSN: [id number])  Kealan Buchan, Veronia Beets, LCSW

## 2016-07-22 NOTE — Clinical Social Work Note (Signed)
Clinical Social Work Assessment  Patient Details  Name: Mackenzie Hill MRN: [id number] Date of Birth: 1940/12/31  Date of referral:  07/22/16               Reason for consult:  Facility Placement                Permission sought to share information with:  Chartered certified accountant granted to share information::  Yes, Verbal Permission Granted  Name::      [place]::   [place]   Relationship::     Contact Information:     Housing/Transportation Living arrangements for the past 2 months:  [place] of Information:  Patient, Other (Comment Required) (Niece ) Patient Interpreter Needed:  None Criminal Activity/Legal Involvement Pertinent to Current Situation/Hospitalization:  No - Comment as needed Significant Relationships:  Adult Children, Other Family Members Lives with:  Adult Children Do you feel safe going back to the place where you live?  Yes Need for family participation in patient care:  Yes (Comment)  Care giving concerns:  Patient lives in [place] and her son Legrand Como stays with her.    Social Worker assessment / plan:  Holiday representative (CSW) received SNF consult. PT is recommending SNF. CSW met with patient and her niece/ HPOA Mackenzie Hill [phone] was at bedside. Patient was alert and oriented X4 and was sitting up in the chair at bedside. CSW introduced self and explained role of CSW department. Patient reported that she lives in [place] and her son Legrand Como lives with her. Per patient she is home alone most of the time because Legrand Como is often out. CSW explained SNF process and that Mcarthur Rossetti will have to approve SNF. Patient reported that she was planning on going home to her niece's house however she is agreeable to SNF search in [place]. FL2 complete and faxed out.   CSW presented bed offers to patient and she chose [facility]. Per [facility] admissions coordinator at [facility] she will  start [facility] authorization today. CSW will continue to follow and assist as needed.   Employment status:  Retired Nurse, adult PT Recommendations:  [place] / Referral to community resources:  [place]  Patient/Family's Response to care:  Patient is agreeable to going to [facility] for short term rehab.   Patient/Family's Understanding of and Emotional Response to Diagnosis, Current Treatment, and Prognosis:  Patient was very pleasant and thanked CSW for assistance.   Emotional Assessment Appearance:  Appears stated age Attitude/Demeanor/Rapport:    Affect (typically observed):  Accepting, Adaptable, Pleasant Orientation:  Oriented to Self, Oriented to Place, Oriented to  Time, Oriented to Situation Alcohol / Substance use:  Not Applicable Psych involvement (Current and /or in the community):  No (Comment)  Discharge Needs  Concerns to be addressed:  Discharge Planning Concerns Readmission within the last 30 days:  No Current discharge risk:  Dependent with Mobility Barriers to Discharge:  Continued Medical Work up   [employer], Veronia Beets, LCSW 07/22/2016, 11:54 AM

## 2016-07-22 NOTE — Anesthesia Postprocedure Evaluation (Signed)
Anesthesia Post Note  Patient: LEIGHTON LUSTER  Procedure(s) Performed: Procedure(s) (LRB): TOTAL HIP ARTHROPLASTY ANTERIOR APPROACH (Right)  Patient location during evaluation: Nursing Unit Anesthesia Type: Spinal Level of consciousness: awake, awake and alert and oriented Pain management: pain level controlled Vital Signs Assessment: post-procedure vital signs reviewed and stable Respiratory status: spontaneous breathing Cardiovascular status: blood pressure returned to baseline Postop Assessment: no signs of nausea or vomiting, no headache and no backache Anesthetic complications: no     Last Vitals:  Vitals:   07/22/16 0406 07/22/16 0716  BP: (!) 141/67 123/60  Pulse: 68 66  Resp: 19   Temp: 36.6 C 36.4 C    Last Pain:  Vitals:   07/22/16 0716  TempSrc: Oral  PainSc:                  Hedda Slade

## 2016-07-22 NOTE — Progress Notes (Signed)
Physical Therapy Treatment Patient Details Name: Mackenzie Hill MRN: [id number] DOB: Jun 09, 1940 Today's Date: 07/22/2016    History of Present Illness Pt is a 76 y/o F s/p R THA.  Pt's PMH includes sarcoidosis, gout, lumbar DDD, COPD, CHF.    PT Comments    Pt is making very modest progress and remains unable to clear R foot from floor when ambulating.  She fatigues quickly when ambulating, limited to 25 ft with RW.  She requires mod assist to boost to standing from chair.  SNF remains most appropriate d/c plan.    Follow Up Recommendations  SNF     Equipment Recommendations  Rolling walker with 5" wheels    Recommendations for Other Services       Precautions / Restrictions Precautions Precautions: Fall Precaution Comments: Chair follow as pt fatigues quickly.  Direct anterior approach, no hip precautions. Restrictions Weight Bearing Restrictions: Yes RLE Weight Bearing: Weight bearing as tolerated    Mobility  Bed Mobility Overal bed mobility: Needs Assistance Bed Mobility: Supine to Sit     Supine to sit: Min assist;HOB elevated     General bed mobility comments: Pt sitting in chair at start and end of session  Transfers Overall transfer level: Needs assistance Equipment used: Rolling walker (2 wheeled) Transfers: Sit to/from Stand Sit to Stand: Mod assist         General transfer comment: Cues to scoot to EOB prior to standing and to flex Bil knees for improved mechanical advantage.  Pt requires mod assist to boost to standing.  Ambulation/Gait Ambulation/Gait assistance: Min assist;Min guard Ambulation Distance (Feet): 25 Feet Assistive device: Rolling walker (2 wheeled) Gait Pattern/deviations: Step-to pattern;Decreased stride length;Decreased weight shift to right;Decreased stance time - right;Decreased step length - left;Antalgic;Trunk flexed;Wide base of support Gait velocity: very decreased Gait velocity interpretation: <1.8 ft/sec, indicative of  risk for recurrent falls General Gait Details: Several techniques trialed in an effort for pt to perform R foot clearance; however pt unable to do so and instead shuffles her foot on the floor when ambulating.  She requires min assist to be able to clear R foot.   Stairs            Wheelchair Mobility    Modified Rankin (Stroke Patients Only)       Balance Overall balance assessment: Needs assistance Sitting-balance support: No upper extremity supported;Feet supported Sitting balance-Leahy Scale: Fair Sitting balance - Comments: Pt relies on having at least 1UE supported when seated EOB.   Standing balance support: Bilateral upper extremity supported;During functional activity Standing balance-Leahy Scale: Poor Standing balance comment: Relies on BUE support for static and dynamic activities.                            Cognition Arousal/Alertness: Awake/alert Behavior During Therapy: WFL for tasks assessed/performed Overall Cognitive Status: Within Functional Limits for tasks assessed                                        Exercises Total Joint Exercises Ankle Circles/Pumps: AROM;Both;10 reps;Seated Quad Sets: Strengthening;Both;10 reps;Supine Heel Slides: AAROM;Right;10 reps;Seated Hip ABduction/ADduction: AAROM;Right;AROM;10 reps;Seated Straight Leg Raises: AAROM;Right;10 reps;Seated Long Arc Quad: AROM;Right;10 reps;Seated Knee Flexion: Other (comment) (attempted in standing but pt unable to achieve) Other Exercises Other Exercises: Pt unable to perform marching in standing so min assist provided to raise  R foot from floor and then had pt perform eccentrically which pt was able to control minimally.  Pt then able to trial on her own successfully clearing R foot ~1 inch x2.      General Comments General comments (skin integrity, edema, etc.): Two friends from church present during therapy session.        Pertinent Vitals/Pain Pain  Assessment: Faces Pain Score: 8  Faces Pain Scale: Hurts whole lot Pain Location: R hip Pain Descriptors / Indicators: Aching;Guarding Pain Intervention(s): Limited activity within patient's tolerance;Monitored during session;Premedicated before session    Home Living Family/patient expects to be discharged to:: Skilled nursing facility Living Arrangements: Children Available Help at Discharge: Family Type of Home: House Home Access: Stairs to enter Entrance Stairs-Rails: None Home Layout: One level Home Equipment: Grab bars - tub/shower;Hand held shower head Additional Comments: Pt was originally planning on going home with daughter at d/c so information above is for daughter's home layout.  Daughter has R knee injury and is currently wearing R KI and ambulating with SPC so she will be limited in her physical assist she can provide to pt.      Prior Function Level of Independence: Independent      Comments: Pt ambulating without use of AD and reports no falls over the past 6 months.  Independent with bathing, dressing, cooking, cleaning.   PT Goals (current goals can now be found in the care plan section) Acute Rehab PT Goals Patient Stated Goal: to get stronger PT Goal Formulation: With patient Time For Goal Achievement: 08/05/16 Potential to Achieve Goals: Fair Progress towards PT goals: Progressing toward goals (very modestly)    Frequency    BID      PT Plan Current plan remains appropriate    Co-evaluation              AM-PAC PT "6 Clicks" Daily Activity  Outcome Measure  Difficulty turning over in bed (including adjusting bedclothes, sheets and blankets)?: Total Difficulty moving from lying on back to sitting on the side of the bed? : Total Difficulty sitting down on and standing up from a chair with arms (e.g., wheelchair, bedside commode, etc,.)?: Total Help needed moving to and from a bed to chair (including a wheelchair)?: A Little Help needed walking  in hospital room?: A Little Help needed climbing 3-5 steps with a railing? : A Lot 6 Click Score: 11    End of Session Equipment Utilized During Treatment: Gait belt Activity Tolerance: Patient limited by fatigue;Patient limited by pain Patient left: in chair;with call bell/phone within reach;with chair alarm set;with family/visitor present Nurse Communication: Mobility status PT Visit Diagnosis: Pain;Muscle weakness (generalized) (M62.81);Unsteadiness on feet (R26.81);Difficulty in walking, not elsewhere classified (R26.2) Pain - Right/Left: Right Pain - part of body: Hip     Time: 1062-6948 PT Time Calculation (min) (ACUTE ONLY): 28 min  Charges:  $Gait Training: 8-22 mins $Therapeutic Exercise: 8-22 mins $Therapeutic Activity: 8-22 mins                    G Codes:       Collie Siad PT, DPT 07/22/2016, 2:24 PM

## 2016-07-23 ENCOUNTER — Encounter
Admission: RE | Admit: 2016-07-23 | Discharge: 2016-07-23 | Disposition: A | Discharge status: Home / Self Care | Payer: Medicare HMO | Source: Ambulatory Visit | Attending: Internal Medicine | Admitting: Internal Medicine

## 2016-07-23 DIAGNOSIS — E1122 Type 2 diabetes mellitus with diabetic chronic kidney disease: Secondary | ICD-10-CM | POA: Diagnosis not present

## 2016-07-23 DIAGNOSIS — Z794 Long term (current) use of insulin: Secondary | ICD-10-CM | POA: Diagnosis not present

## 2016-07-23 DIAGNOSIS — I110 Hypertensive heart disease with heart failure: Secondary | ICD-10-CM | POA: Diagnosis not present

## 2016-07-23 DIAGNOSIS — I5032 Chronic diastolic (congestive) heart failure: Secondary | ICD-10-CM | POA: Diagnosis not present

## 2016-07-23 DIAGNOSIS — M1A00X0 Idiopathic chronic gout, unspecified site, without tophus (tophi): Secondary | ICD-10-CM | POA: Diagnosis not present

## 2016-07-23 DIAGNOSIS — M150 Primary generalized (osteo)arthritis: Secondary | ICD-10-CM | POA: Diagnosis not present

## 2016-07-23 DIAGNOSIS — I10 Essential (primary) hypertension: Secondary | ICD-10-CM | POA: Diagnosis not present

## 2016-07-23 DIAGNOSIS — E119 Type 2 diabetes mellitus without complications: Secondary | ICD-10-CM | POA: Diagnosis not present

## 2016-07-23 DIAGNOSIS — Z96641 Presence of right artificial hip joint: Secondary | ICD-10-CM | POA: Diagnosis not present

## 2016-07-23 DIAGNOSIS — M1611 Unilateral primary osteoarthritis, right hip: Secondary | ICD-10-CM | POA: Diagnosis not present

## 2016-07-23 DIAGNOSIS — N184 Chronic kidney disease, stage 4 (severe): Secondary | ICD-10-CM | POA: Diagnosis not present

## 2016-07-23 DIAGNOSIS — D860 Sarcoidosis of lung: Secondary | ICD-10-CM | POA: Diagnosis not present

## 2016-07-23 DIAGNOSIS — R262 Difficulty in walking, not elsewhere classified: Secondary | ICD-10-CM | POA: Diagnosis not present

## 2016-07-23 DIAGNOSIS — Z471 Aftercare following joint replacement surgery: Secondary | ICD-10-CM | POA: Diagnosis not present

## 2016-07-23 DIAGNOSIS — G4733 Obstructive sleep apnea (adult) (pediatric): Secondary | ICD-10-CM | POA: Diagnosis not present

## 2016-07-23 DIAGNOSIS — J431 Panlobular emphysema: Secondary | ICD-10-CM | POA: Diagnosis not present

## 2016-07-23 DIAGNOSIS — Z743 Need for continuous supervision: Secondary | ICD-10-CM | POA: Diagnosis not present

## 2016-07-23 DIAGNOSIS — R6889 Other general symptoms and signs: Secondary | ICD-10-CM | POA: Diagnosis not present

## 2016-07-23 DIAGNOSIS — M6281 Muscle weakness (generalized): Secondary | ICD-10-CM | POA: Diagnosis not present

## 2016-07-23 DIAGNOSIS — J449 Chronic obstructive pulmonary disease, unspecified: Secondary | ICD-10-CM | POA: Diagnosis not present

## 2016-07-23 LAB — CBC
HCT: 26.3 % — ABNORMAL LOW (ref 35.0–47.0)
Hemoglobin: 9 g/dL — ABNORMAL LOW (ref 12.0–16.0)
MCH: 31 pg (ref 26.0–34.0)
MCHC: 34.1 g/dL (ref 32.0–36.0)
MCV: 90.8 fL (ref 80.0–100.0)
PLATELETS: 226 10*3/uL (ref 150–440)
RBC: 2.9 MIL/uL — AB (ref 3.80–5.20)
RDW: 13.2 % (ref 11.5–14.5)
WBC: 10.7 10*3/uL (ref 3.6–11.0)

## 2016-07-23 LAB — BASIC METABOLIC PANEL
Anion gap: 5 (ref 5–15)
BUN: 32 mg/dL — AB (ref 6–20)
CO2: 22 mmol/L (ref 22–32)
CREATININE: 2.08 mg/dL — AB (ref 0.44–1.00)
Calcium: 8.1 mg/dL — ABNORMAL LOW (ref 8.9–10.3)
Chloride: 107 mmol/L (ref 101–111)
GFR, EST AFRICAN AMERICAN: 26 mL/min — AB (ref >60)
GFR, EST NON AFRICAN AMERICAN: 22 mL/min — AB (ref >60)
Glucose, Bld: 163 mg/dL — ABNORMAL HIGH (ref 65–99)
Potassium: 4.4 mmol/L (ref 3.5–5.1)
SODIUM: 134 mmol/L — AB (ref 135–145)

## 2016-07-23 LAB — GLUCOSE, CAPILLARY
GLUCOSE-CAPILLARY: 155 mg/dL — AB (ref 65–99)
Glucose-Capillary: 200 mg/dL — ABNORMAL HIGH (ref 65–99)

## 2016-07-23 LAB — SURGICAL PATHOLOGY

## 2016-07-23 MED ORDER — ENOXAPARIN SODIUM 40 MG/0.4ML ~~LOC~~ SOLN: 40.0000 mg | SUBCUTANEOUS | 0 refills | Status: DC

## 2016-07-23 MED ORDER — ENOXAPARIN SODIUM 30 MG/0.3ML ~~LOC~~ SOLN: 30.0000 mg | SUBCUTANEOUS | Status: DC

## 2016-07-23 MED ORDER — OXYCODONE HCL 5 MG PO TABS: 5.0000 mg | ORAL_TABLET | ORAL | 0 refills | Status: DC | PRN

## 2016-07-23 NOTE — Progress Notes (Signed)
Rept called to Educational psychologist at [facility] [place]. All questions answered. Transport called.

## 2016-07-23 NOTE — Progress Notes (Signed)
Physical Therapy Treatment Patient Details Name: Mackenzie Hill MRN: [id number] DOB: [birth year] Today's Date: 07/23/2016    History of Present Illness Pt is a 76 y/o F s/p R THA.  Pt's PMH includes sarcoidosis, gout, lumbar DDD, COPD, CHF.    PT Comments    Upon entering room, pt requesting bedside commode.  Assisted with mod assist to edge of bed and to stand from all surfaces.  She was able to ambulate 25' to door this am but was too fatigued to ambulate back.  She continues with difficulty picking up and advancing RLE due to pain and weakness.  She initially needs help to advance but by end of session she is able slide it along the floor but does not clear her foot.  Participated in exercises as described below.   Follow Up Recommendations  SNF     Equipment Recommendations  Rolling walker with 5" wheels    Recommendations for Other Services       Precautions / Restrictions Precautions Precautions: Fall Precaution Comments: Chair follow as pt fatigues quickly.  Direct anterior approach, no hip precautions. Restrictions Weight Bearing Restrictions: Yes RLE Weight Bearing: Weight bearing as tolerated    Mobility  Bed Mobility Overal bed mobility: Needs Assistance Bed Mobility: Supine to Sit     Supine to sit: Mod assist     General bed mobility comments: increased time, use of rails and HOB raised  Transfers Overall transfer level: Needs assistance Equipment used: Rolling walker (2 wheeled) Transfers: Sit to/from Stand Sit to Stand: Mod assist            Ambulation/Gait Ambulation/Gait assistance: Min assist;Min guard Ambulation Distance (Feet): 25 Feet Assistive device: Rolling walker (2 wheeled) Gait Pattern/deviations: Step-to pattern;Decreased step length - right;Decreased weight shift to right;Decreased stance time - right;Wide base of support;Trunk flexed Gait velocity: very decreased Gait velocity interpretation: <1.8 ft/sec, indicative of risk for  recurrent falls General Gait Details: Very slow gait,  Difficulty advancing RLE initially and needed assist to advance.  Eventually she was able to do so without assist but slides foot along floor.    Stairs            Wheelchair Mobility    Modified Rankin (Stroke Patients Only)       Balance Overall balance assessment: Needs assistance Sitting-balance support: No upper extremity supported;Feet supported Sitting balance-Leahy Scale: Fair Sitting balance - Comments: Pt relies on having at least 1UE supported when seated EOB.   Standing balance support: Bilateral upper extremity supported;During functional activity Standing balance-Leahy Scale: Fair Standing balance comment: Relies on BUE support for static and dynamic activities.                            Cognition Arousal/Alertness: Awake/alert Behavior During Therapy: WFL for tasks assessed/performed Overall Cognitive Status: Within Functional Limits for tasks assessed                                        Exercises Total Joint Exercises Ankle Circles/Pumps: AROM;Both;10 reps;Seated Towel Squeeze: AROM;Both;10 reps;Seated Long Arc Quad: AAROM;Right;10 reps;Seated Marching in Standing: AAROM;Right;Seated;10 reps Other Exercises Other Exercises: to commode to void.    General Comments        Pertinent Vitals/Pain Pain Assessment: 0-10 Pain Score: 5  Pain Location: R hip Pain Descriptors / Indicators: Aching;Guarding Pain Intervention(s): Limited  activity within patient's tolerance;Premedicated before session;Monitored during session    Home Living                      Prior Function            PT Goals (current goals can now be found in the care plan section) Progress towards PT goals: Progressing toward goals    Frequency    BID      PT Plan Current plan remains appropriate    Co-evaluation              AM-PAC PT "6 Clicks" Daily Activity   Outcome Measure  Difficulty turning over in bed (including adjusting bedclothes, sheets and blankets)?: Total Difficulty moving from lying on back to sitting on the side of the bed? : Total Difficulty sitting down on and standing up from a chair with arms (e.g., wheelchair, bedside commode, etc,.)?: Total Help needed moving to and from a bed to chair (including a wheelchair)?: A Little Help needed walking in hospital room?: A Little Help needed climbing 3-5 steps with a railing? : A Lot 6 Click Score: 11    End of Session Equipment Utilized During Treatment: Gait belt Activity Tolerance: Patient limited by fatigue;Patient limited by pain Patient left: in chair;with call bell/phone within reach;with chair alarm set   Pain - Right/Left: Right Pain - part of body: Hip     Time: 4035-2481 PT Time Calculation (min) (ACUTE ONLY): 27 min  Charges:  $Gait Training: 8-22 mins $Therapeutic Exercise: 8-22 mins                    G Codes:      Chesley Noon, PTA 07/23/16, 9:51 AM

## 2016-07-23 NOTE — Progress Notes (Addendum)
   Subjective: 2 Days Post-Op Procedure(s) (LRB): TOTAL HIP ARTHROPLASTY ANTERIOR APPROACH (Right) Patient reports pain as mild.  Swelling and pain improving. Patient is well, and has had no acute complaints or problems Denies any CP, SOB, ABD pain. We will start therapy today.    Objective: Vital signs in last 24 hours: Temp:  [98.9 F (37.2 C)-100.3 F (37.9 C)] 100.3 F (37.9 C) (07/11 2307) Pulse Rate:  [70-78] 78 (07/11 2307) Resp:  [18] 18 (07/11 2307) BP: (122-138)/(45-68) 122/45 (07/11 2307) SpO2:  [98 %] 98 % (07/11 2307)  Intake/Output from previous day: 07/11 0701 - 07/12 0700 In: 960 [P.O.:960] Out: -  Intake/Output this shift: No intake/output data recorded.   Recent Labs  07/21/16 1752 07/22/16 0425 07/23/16 0334  HGB 10.6* 9.7* 9.0*    Recent Labs  07/22/16 0425 07/23/16 0334  WBC 7.2 10.7  RBC 3.21* 2.90*  HCT 28.9* 26.3*  PLT 272 226    Recent Labs  07/22/16 0425 07/23/16 0334  NA 138 134*  K 5.0 4.4  CL 111 107  CO2 22 22  BUN 23* 32*  CREATININE 1.75* 2.08*  GLUCOSE 145* 163*  CALCIUM 8.5* 8.1*   No results for input(s): LABPT, INR in the last 72 hours.  EXAM General - Patient is Alert, Appropriate and Oriented Extremity - Neurovascular intact Sensation intact distally Intact pulses distally Dorsiflexion/Plantar flexion intact Dressing - dressing C/D/I and no drainage, wound vac intact Motor Function - intact, moving foot and toes well on exam.   Past Medical History:  Diagnosis Date  . Anemia   . Arthritis   . Cataract    Bilateral  . CHF (congestive heart failure) ([place])   . Chronic kidney disease   . COPD (chronic obstructive pulmonary disease) ([place])   . DDD (degenerative disc disease), lumbar   . Diabetes mellitus without complication ([place])   . Dyspnea    with exertion  . Gout   . Hypertension   . Sarcoidosis, lung ([place])    reported by pt  . Sleep apnea    OSA--Use C-PAP    Assessment/Plan:   2 Days  Post-Op Procedure(s) (LRB): TOTAL HIP ARTHROPLASTY ANTERIOR APPROACH (Right) Active Problems:   Primary localized osteoarthritis of right hip   CKD stage 4  Estimated body mass index is 39.44 kg/m as calculated from the following:   Height as of this encounter: 5\' 5"  (1.651 m).   Weight as of this encounter: 107.5 kg (237 lb). Advance diet Up with therapy  Needs BM Labs are stable.  CM to assist with discharge to SNF tomorrow  DVT Prophylaxis - Lovenox, Foot Pumps and TED hose Weight-Bearing as tolerated to right leg   T. Rachelle Hora, PA-C [place] 07/23/2016, 8:00 AM

## 2016-07-23 NOTE — Progress Notes (Signed)
Physical Therapy Treatment Patient Details Name: Mackenzie Hill MRN: [id number] DOB: April 13, 1940 Today's Date: 07/23/2016    History of Present Illness Pt is a 76 y/o F s/p R THA.  Pt's PMH includes sarcoidosis, gout, lumbar DDD, COPD, CHF.    PT Comments    Pt in chair needing to void.  Transferred to bedside commode with min assist.  She was able to continue after and ambulate to door with continued slow gait and occasional assist to advance RLE and to position foot properly on the floor.  At times positions in external rotation but it seems to stem from difficulty advancing her foot and not clearing it as she does so.  She uses her toes to help scoot her foot along.  Gait remains slow with self initiating rest breaks.  She fatigues quickly and requires a wheelchair to be brought along for when she needs to sit.  No buckling or LOB noted.  She was able to ambulate to the threshold of her room this session. Returned to supine after session with mod assist.  Encouraged out of bed again this afternoon with nursing when she feels rested.     Follow Up Recommendations  SNF     Equipment Recommendations  Rolling walker with 5" wheels    Recommendations for Other Services       Precautions / Restrictions Precautions Precautions: Fall Precaution Comments: Chair follow as pt fatigues quickly.  Direct anterior approach, no hip precautions. Restrictions Weight Bearing Restrictions: Yes RLE Weight Bearing: Weight bearing as tolerated    Mobility  Bed Mobility Overal bed mobility: Needs Assistance Bed Mobility: Sit to Supine     Supine to sit: Mod assist Sit to supine: Mod assist   General bed mobility comments: for LE management  Transfers Overall transfer level: Needs assistance Equipment used: Rolling walker (2 wheeled) Transfers: Sit to/from Stand Sit to Stand: Min assist         General transfer comment: Less assist this session but still requires increased  time  Ambulation/Gait Ambulation/Gait assistance: Min guard;Min assist Ambulation Distance (Feet): 25 Feet Assistive device: Rolling walker (2 wheeled) Gait Pattern/deviations: Step-to pattern;Decreased step length - right;Decreased weight shift to right;Decreased stance time - right;Wide base of support;Trunk flexed Gait velocity: very decreased Gait velocity interpretation: <1.8 ft/sec, indicative of risk for recurrent falls General Gait Details: Very slow gait,  Difficulty advancing RLE initially and needed assist to advance.  Eventually she was able to do so without assist but slides foot along floor.    Stairs            Wheelchair Mobility    Modified Rankin (Stroke Patients Only)       Balance Overall balance assessment: Needs assistance Sitting-balance support: No upper extremity supported;Feet supported Sitting balance-Leahy Scale: Fair Sitting balance - Comments: Pt relies on having at least 1UE supported when seated EOB.   Standing balance support: Bilateral upper extremity supported;During functional activity Standing balance-Leahy Scale: Fair Standing balance comment: Relies on BUE support for static and dynamic activities.                            Cognition Arousal/Alertness: Awake/alert Behavior During Therapy: WFL for tasks assessed/performed Overall Cognitive Status: Within Functional Limits for tasks assessed  Exercises Total Joint Exercises Ankle Circles/Pumps: AROM;Both;10 reps;Seated Quad Sets: Strengthening;Both;10 reps;Supine;AROM Gluteal Sets: Strengthening;Supine;Both;10 reps Towel Squeeze: AROM;Strengthening;Supine;Both;10 reps Short Arc Quad: AROM;Strengthening;Supine;Right;20 reps Heel Slides: AAROM;Strengthening;Supine;Right;10 reps Hip ABduction/ADduction: AAROM;Strengthening;Supine;Right;10 reps Long Arc Quad: AAROM;Right;10 reps;Seated Marching in Standing:  AAROM;Right;Seated;10 reps Other Exercises Other Exercises: to commode to void. Other Exercises: HEP review    General Comments        Pertinent Vitals/Pain Pain Assessment: 0-10 Pain Score: 5  Pain Location: R hip Pain Descriptors / Indicators: Aching;Guarding Pain Intervention(s): Limited activity within patient's tolerance;Monitored during session    Home Living                      Prior Function            PT Goals (current goals can now be found in the care plan section) Progress towards PT goals: Progressing toward goals    Frequency    BID      PT Plan Current plan remains appropriate    Co-evaluation              AM-PAC PT "6 Clicks" Daily Activity  Outcome Measure  Difficulty turning over in bed (including adjusting bedclothes, sheets and blankets)?: Total Difficulty moving from lying on back to sitting on the side of the bed? : Total Difficulty sitting down on and standing up from a chair with arms (e.g., wheelchair, bedside commode, etc,.)?: Total Help needed moving to and from a bed to chair (including a wheelchair)?: A Little Help needed walking in hospital room?: A Little Help needed climbing 3-5 steps with a railing? : A Lot 6 Click Score: 11    End of Session Equipment Utilized During Treatment: Gait belt Activity Tolerance: Patient limited by fatigue;Patient limited by pain Patient left: with call bell/phone within reach;in bed;with bed alarm set;with chair alarm set   Pain - Right/Left: Right Pain - part of body: Hip     Time: 2774-1287 PT Time Calculation (min) (ACUTE ONLY): 32 min  Charges:  $Gait Training: 8-22 mins $Therapeutic Exercise: 8-22 mins                    G Codes:      Chesley Noon, PTA 07/23/16, 11:40 AM

## 2016-07-23 NOTE — Progress Notes (Signed)
Chris PA for Dr. Rudene Christians aware that pt has been having low grade temps 100.0. WBC 10.7 today. Per his order proceed with discharge to SNF today

## 2016-07-23 NOTE — Clinical Social Work Note (Signed)
Patient to be d/c'ed today to [facility].  Patient and family agreeable to plans will transport via ems RN to call report to [phone].  Evette Cristal, MSW, [place]

## 2016-07-23 NOTE — Clinical Social Work Note (Signed)
CSW received phone call from [facility], they have received insurance authorization for patient and can accept patient today.  CSW notified charge nurse to pass message to physician.  CSW awaiting for discharge summary before patient can discharge to SNF.  Jones Broom. [place], MSW, [place]  07/23/2016 11:29 AM

## 2016-07-23 NOTE — Discharge Summary (Signed)
Physician Discharge Summary  Patient ID: Mackenzie Hill MRN: [id number] DOB/AGE: 09/24/1940 76 y.o.  Admit date: 07/21/2016 Discharge date: 07/23/2016  Admission Diagnoses:  PRIMARY OSTEOARTHRITIS   Discharge Diagnoses: Patient Active Problem List   Diagnosis Date Noted  . Primary localized osteoarthritis of right hip 07/21/2016    Past Medical History:  Diagnosis Date  . Anemia   . Arthritis   . Cataract    Bilateral  . CHF (congestive heart failure) ([place])   . Chronic kidney disease   . COPD (chronic obstructive pulmonary disease) ([place])   . DDD (degenerative disc disease), lumbar   . Diabetes mellitus without complication ([place])   . Dyspnea    with exertion  . Gout   . Hypertension   . Sarcoidosis, lung ([place])    reported by pt  . Sleep apnea    OSA--Use C-PAP     Transfusion: none   Consultants (if any):   Discharged Condition: Improved  Hospital Course: AILINE Hill is an 76 y.o. female who was admitted 07/21/2016 with a diagnosis of right hip osteoarthritis and went to the operating room on 07/21/2016 and underwent the above named procedures.    Surgeries: Procedure(s): TOTAL HIP ARTHROPLASTY ANTERIOR APPROACH on 07/21/2016 Patient tolerated the surgery well. Taken to PACU where she was stabilized and then transferred to the orthopedic floor.  Started on Lovenox 40 q 24 hrs. Foot pumps applied bilaterally at 80 mm. Heels elevated on bed with rolled towels. No evidence of DVT. Negative Homan. Physical therapy started on day #1 for gait training and transfer. OT started day #1 for ADL and assisted devices.  Patient's foley was d/c on day #1. Patient's IV  was d/c on day #2.  On post op day #2 patient was stable and ready for discharge to SNF.  Implants: Medacta AMIS 3 standard stem with 52 mm Mpact DM cup and liner with M 28 mm metal head   Remove wound vac on 07/28/16 and apply honey comb dressing. Change dressing as needed. Keep incision clean and dry  She  was given perioperative antibiotics:  Anti-infectives    Start     Dose/Rate Route Frequency Ordered Stop   07/21/16 2000  clindamycin (CLEOCIN) IVPB 900 mg     900 mg 100 mL/hr over 30 Minutes Intravenous Every 6 hours 07/21/16 1714 07/22/16 1506   07/21/16 1051  clindamycin (CLEOCIN) 900 MG/50ML IVPB    Comments:  Norton Blizzard  : cabinet override      07/21/16 1051 07/21/16 1419   07/20/16 2245  clindamycin (CLEOCIN) IVPB 900 mg     900 mg 100 mL/hr over 30 Minutes Intravenous  Once 07/20/16 2244 07/21/16 1434    .  She was given sequential compression devices, early ambulation, and Lovenox for DVT prophylaxis.  She benefited maximally from the hospital stay and there were no complications.    Recent vital signs:  Vitals:   07/23/16 0825 07/23/16 1010  BP: 132/72   Pulse: 76   Resp: 18   Temp: 100 F (37.8 C) 100.1 F (37.8 C)    Recent laboratory studies:  Lab Results  Component Value Date   HGB 9.0 (L) 07/23/2016   HGB 9.7 (L) 07/22/2016   HGB 10.6 (L) 07/21/2016   Lab Results  Component Value Date   WBC 10.7 07/23/2016   PLT 226 07/23/2016   Lab Results  Component Value Date   INR 0.99 07/07/2016   Lab Results  Component Value Date  NA 134 (L) 07/23/2016   K 4.4 07/23/2016   CL 107 07/23/2016   CO2 22 07/23/2016   BUN 32 (H) 07/23/2016   CREATININE 2.08 (H) 07/23/2016   GLUCOSE 163 (H) 07/23/2016    Discharge Medications:   Allergies as of 07/23/2016      Reactions   Hydralazine Itching   Meloxicam Other (See Comments)   Causes excessive sleepiness fainting   Penicillins Hives, Itching, Other (See Comments)   Whelps Has patient had a PCN reaction causing immediate rash, facial/tongue/throat swelling, SOB or lightheadedness with hypotension: Yes Has patient had a PCN reaction causing severe rash involving mucus membranes or skin necrosis: No Has patient had a PCN reaction that required hospitalization: No Has patient had a PCN reaction  occurring within the last 10 years: No If all of the above answers are "NO", then may proceed with Cephalosporin use.   Pioglitazone Itching   Tramadol Other (See Comments)   Causes excessive sleepiness Dizzy/fainting      Medication List    TAKE these medications   albuterol 108 (90 Base) MCG/ACT inhaler Commonly known as:  PROVENTIL HFA;VENTOLIN HFA Inhale 2 puffs into the lungs every 6 (six) hours as needed for wheezing or shortness of breath.   amLODipine 5 MG tablet Commonly known as:  NORVASC Take 5 mg by mouth daily.   azithromycin 250 MG tablet Commonly known as:  ZITHROMAX Z-PAK Take 2 tablets (500 mg) on  Day 1,  followed by 1 tablet (250 mg) once daily on Days 2 through 5.   brimonidine 0.15 % ophthalmic solution Commonly known as:  ALPHAGAN Place 1 drop into both eyes 3 (three) times daily.   budesonide-formoterol 160-4.5 MCG/ACT inhaler Commonly known as:  SYMBICORT Inhale 2 puffs into the lungs 2 (two) times daily.   CALCIUM 600/VITAMIN D 600-400 MG-UNIT Tabs Generic drug:  Calcium Carbonate-Vitamin D3 Take 2 tablets by mouth daily.   cetirizine 10 MG tablet Commonly known as:  ZYRTEC Take 10 mg by mouth at bedtime.   diclofenac sodium 1 % Gel Commonly known as:  VOLTAREN Apply topically 2 (two) times daily as needed (pain).   enoxaparin 40 MG/0.4ML injection Commonly known as:  LOVENOX Inject 0.4 mLs (40 mg total) into the skin daily.   fexofenadine 180 MG tablet Commonly known as:  ALLEGRA Take 180 mg by mouth daily.   fluticasone 50 MCG/ACT nasal spray Commonly known as:  FLONASE Place 1 spray into both nostrils 2 (two) times daily.   glipiZIDE 10 MG 24 hr tablet Commonly known as:  GLUCOTROL XL Take 10 mg by mouth 2 (two) times daily.   hydrochlorothiazide 12.5 MG tablet Commonly known as:  HYDRODIURIL Take 12.5 mg by mouth at bedtime.   ipratropium-albuterol 0.5-2.5 (3) MG/3ML Soln Commonly known as:  DUONEB Take 3 mLs by  nebulization every 6 (six) hours as needed (shortness of breath or wheezing).   isosorbide mononitrate 60 MG 24 hr tablet Commonly known as:  IMDUR Take 180 mg by mouth daily.   losartan 50 MG tablet Commonly known as:  COZAAR Take 100 mg by mouth daily.   metFORMIN 1000 MG tablet Commonly known as:  GLUCOPHAGE Take 500 mg by mouth 2 (two) times daily with a meal.   mometasone 50 MCG/ACT nasal spray Commonly known as:  NASONEX Place 2 sprays into the nose daily as needed (allergies).   montelukast 10 MG tablet Commonly known as:  SINGULAIR Take 10 mg by mouth every evening.  oxyCODONE 5 MG immediate release tablet Commonly known as:  Oxy IR/ROXICODONE Take 1-2 tablets (5-10 mg total) by mouth every 3 (three) hours as needed for breakthrough pain.   sitaGLIPtin 50 MG tablet Commonly known as:  JANUVIA Take 50 mg by mouth daily.   spironolactone 25 MG tablet Commonly known as:  ALDACTONE Take 25 mg by mouth daily.   ULORIC 40 MG tablet Generic drug:  febuxostat Take 40 mg by mouth daily.   vitamin B-12 1000 MCG tablet Commonly known as:  CYANOCOBALAMIN Take 1,000 mcg by mouth daily.   vitamin C 500 MG tablet Commonly known as:  ASCORBIC ACID Take 500 mg by mouth daily.       Diagnostic Studies: Dg Hip Operative Unilat W Or W/o Pelvis Right  Result Date: 07/21/2016 CLINICAL DATA:  Elective surgery. EXAM: OPERATIVE RIGHT HIP (WITH PELVIS IF PERFORMED) 2 VIEWS TECHNIQUE: Fluoroscopic spot image(s) were submitted for interpretation post-operatively. COMPARISON:  None. FINDINGS: Two fluoroscopic spot images of the right hip. Initial image demonstrates osteoarthritis. Subsequently there is right hip arthroplasty. Radiation dose reported 5.20 mGy. Sidedness not discretely labeled, however appears to be the right hip by convention. IMPRESSION: Intra procedural fluoroscopy with right hip arthroplasty. Electronically Signed   By: Jeb Levering M.D.   On: 07/21/2016 16:13    Dg Hip Unilat W Or W/o Pelvis 2-3 Views Right  Result Date: 07/21/2016 CLINICAL DATA:  Post right total hip replacement EXAM: DG HIP (WITH OR WITHOUT PELVIS) 2-3V RIGHT COMPARISON:  None. FINDINGS: The components of the right total hip replacement appear to be in good position. No complicating features are seen. IMPRESSION: Right total hip replacement components in good position Electronically Signed   By: Ivar Drape M.D.   On: 07/21/2016 16:19    Disposition: 01-Home or Self Care    Contact information for after-discharge care    Destination    [facility] .   Specialty:  [place] information: [address] [place] [phone]               Signed: Dorise Hiss [facility] 07/23/2016, 2:09 PM

## 2016-07-23 NOTE — Discharge Instructions (Signed)

## 2016-07-23 NOTE — Progress Notes (Signed)
Pt ordered lovenox 40mg  q 24hr. Pt w/ a crcl 15-51ml/min. Order changed to 30mg  q 24hr per protocol.  Ramond Dial, Pharm.D, BCPS Clinical Pharmacist

## 2016-07-23 NOTE — Progress Notes (Signed)
Patient discharged to [facility] Per EMS. VSS. Packet given to driver. Patient called family to notify them that she was leaving.

## 2016-07-24 DIAGNOSIS — E119 Type 2 diabetes mellitus without complications: Secondary | ICD-10-CM | POA: Diagnosis not present

## 2016-07-24 DIAGNOSIS — J431 Panlobular emphysema: Secondary | ICD-10-CM | POA: Diagnosis not present

## 2016-07-24 DIAGNOSIS — M150 Primary generalized (osteo)arthritis: Secondary | ICD-10-CM | POA: Diagnosis not present

## 2016-07-24 DIAGNOSIS — Z794 Long term (current) use of insulin: Secondary | ICD-10-CM | POA: Diagnosis not present

## 2016-07-24 DIAGNOSIS — I10 Essential (primary) hypertension: Secondary | ICD-10-CM | POA: Diagnosis not present

## 2016-07-30 ENCOUNTER — Non-Acute Institutional Stay (SKILLED_NURSING_FACILITY): Payer: Medicare HMO | Admitting: Gerontology

## 2016-07-30 DIAGNOSIS — J449 Chronic obstructive pulmonary disease, unspecified: Secondary | ICD-10-CM | POA: Diagnosis not present

## 2016-07-30 DIAGNOSIS — M1611 Unilateral primary osteoarthritis, right hip: Secondary | ICD-10-CM

## 2016-07-31 ENCOUNTER — Encounter: Payer: Self-pay | Admitting: Gerontology

## 2016-07-31 NOTE — Progress Notes (Signed)
Location:   [facility] Room Number: 208B Place of Service:  SNF [phone]) Provider:  Toni Arthurs, NP-C  Sparks, Leonie Douglas, MD  Patient Care Team: Idelle Crouch, MD as PCP - General (Internal Medicine) Minor, Dalbert Garnet, RN as [facility]  Extended Emergency Contact Information Primary Emergency Contact: [place] Phone: [phone] Relation: Friend Secondary Emergency Contact: Forrestine Him Address: [address]          [place], [place] [address] [place] Phone: [phone] Relation: Niece  Code Status: FULL Goals of care: Advanced Directive information Advanced Directives 07/31/2016  Does Patient Have a Medical Advance Directive? No  Type of Advance Directive -  Does patient want to make changes to medical advance directive? -  Copy of [place] in Chart? -  Would patient like information on creating a medical advance directive? -     Chief Complaint  Patient presents with  . Medical Management of Chronic Issues    Routine Visit    HPI:  Pt is a 76 y.o. female seen today for follow up. Pt was admitted to the facility for rehab following hospitalization for right Total Hip Arthroplasty. She has been participating in PT and OT. Therapy reports she is progressing slowly. Pt reports her pain is well controlled with current regimen. Appetite is good. Having regular BMs. Pt was noted to have wheezing on exam. Pt reports she uses her rescue inhaler 4-6 times per day at home. Pt agreeable to medication adjustments for her COPD. Otherwise, pt reports she is feeling well. VSS. No other complaints.     Past Medical History:  Diagnosis Date  . Anemia   . Anemia, unspecified    Hgb 11.8 - 04/2013  . Arthritis   . Cataract    Bilateral  . CHF (congestive heart failure) ([place])   . Chronic kidney disease   . COPD (chronic obstructive pulmonary disease)  ([place])   . DDD (degenerative disc disease), lumbar   . Degenerative arthritis   . Diabetes mellitus type 2, uncomplicated (H[facility])    AODM (A1C 6.9%) 04/2013  . Diabetes mellitus without complication ([place])   . Dyspnea    with exertion  . Esophagitis   . Gout    Uric acid 5.4 - 08/2012  . Hypertension   . Obesity, unspecified   . Sarcoidosis, lung ([place])    reported by pt, Clinically without a biopsy  . Sleep apnea    OSA--Use C-PAP   Past Surgical History:  Procedure Laterality Date  . ABDOMINAL HYSTERECTOMY     age 64  . COLONOSCOPY  11/02/2005   Hyperplastic Polyp: CBF 10/2015; Recall Ltr mailed 08/27/2015 (dw)  . FOOT SURGERY Right   . HALLUX VALGUS REPAIR Left   . OOPHORECTOMY    . TOTAL HIP ARTHROPLASTY 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   Procedure: TOTAL HIP ARTHROPLASTY ANTERIOR APPROACH;  Surgeon: Hessie Knows, MD;  Location: [facility] ORS;  Service: Orthopedics;  Laterality: Right;    Allergies  Allergen Reactions  . Hydralazine Itching  . Meloxicam Other (See Comments)    Causes excessive sleepiness fainting  . Penicillins Hives, Itching and Other (See Comments)    Whelps Has patient had a PCN reaction causing immediate rash, facial/tongue/throat swelling, SOB or lightheadedness with hypotension: Yes Has patient had a PCN reaction causing severe rash involving mucus membranes or skin necrosis: No Has patient had a PCN reaction that required hospitalization:  No Has patient had a PCN reaction occurring within the last 10 years: No If all of the above answers are "NO", then may proceed with Cephalosporin use.   . Pioglitazone Itching  . Tramadol Other (See Comments)    Causes excessive sleepiness Dizzy/fainting    Allergies as of 07/30/2016      Reactions   Hydralazine Itching   Meloxicam Other (See Comments)   Causes excessive sleepiness fainting   Penicillins Hives, Itching, Other (See Comments)   Whelps Has patient had a PCN reaction causing immediate rash, facial/tongue/throat  swelling, SOB or lightheadedness with hypotension: Yes Has patient had a PCN reaction causing severe rash involving mucus membranes or skin necrosis: No Has patient had a PCN reaction that required hospitalization: No Has patient had a PCN reaction occurring within the last 10 years: No If all of the above answers are "NO", then may proceed with Cephalosporin use.   Pioglitazone Itching   Tramadol Other (See Comments)   Causes excessive sleepiness Dizzy/fainting      Medication List       Accurate as of 07/30/16 11:59 PM. Always use your most recent med list.          albuterol 108 (90 Base) MCG/ACT inhaler Commonly known as:  PROVENTIL HFA;VENTOLIN HFA Inhale 2 puffs into the lungs every 6 (six) hours as needed for wheezing or shortness of breath.   amLODipine 5 MG tablet Commonly known as:  NORVASC Take 5 mg by mouth daily.   brimonidine 0.15 % ophthalmic solution Commonly known as:  ALPHAGAN Place 1 drop into both eyes 3 (three) times daily.   budesonide-formoterol 160-4.5 MCG/ACT inhaler Commonly known as:  SYMBICORT Inhale 2 puffs into the lungs 2 (two) times daily.   CALCIUM 600/VITAMIN D 600-400 MG-UNIT Tabs Generic drug:  Calcium Carbonate-Vitamin D3 Take 2 tablets by mouth daily.   cetirizine 10 MG tablet Commonly known as:  ZYRTEC Take 10 mg by mouth at bedtime.   diclofenac sodium 1 % Gel Commonly known as:  VOLTAREN Apply topically 2 (two) times daily as needed (pain).   enoxaparin 40 MG/0.4ML injection Commonly known as:  LOVENOX Inject 0.4 mLs (40 mg total) into the skin daily.   fexofenadine 180 MG tablet Commonly known as:  ALLEGRA Take 180 mg by mouth daily.   glipiZIDE 10 MG 24 hr tablet Commonly known as:  GLUCOTROL XL Take 10 mg by mouth 2 (two) times daily.   ipratropium-albuterol 0.5-2.5 (3) MG/3ML Soln Commonly known as:  DUONEB Take 3 mLs by nebulization every 6 (six) hours as needed (shortness of breath or wheezing).   isosorbide  mononitrate 60 MG 24 hr tablet Commonly known as:  IMDUR Take 180 mg by mouth daily.   losartan 50 MG tablet Commonly known as:  COZAAR Take 100 mg by mouth daily.   metFORMIN 1000 MG tablet Commonly known as:  GLUCOPHAGE Take 500 mg by mouth 2 (two) times daily with a meal.   mometasone 50 MCG/ACT nasal spray Commonly known as:  NASONEX Place 2 sprays into the nose daily as needed (allergies).   montelukast 10 MG tablet Commonly known as:  SINGULAIR Take 10 mg by mouth every evening.   oxyCODONE 5 MG immediate release tablet Commonly known as:  Oxy IR/ROXICODONE Take 1-2 tablets (5-10 mg total) by mouth every 3 (three) hours as needed for breakthrough pain.   sitaGLIPtin 50 MG tablet Commonly known as:  JANUVIA Take 50 mg by mouth daily.   spironolactone 25  MG tablet Commonly known as:  ALDACTONE Take 25 mg by mouth daily.   tiotropium 18 MCG inhalation capsule Commonly known as:  SPIRIVA Place 18 mcg into inhaler and inhale daily.   ULORIC 40 MG tablet Generic drug:  febuxostat Take 40 mg by mouth daily.   vitamin B-12 1000 MCG tablet Commonly known as:  CYANOCOBALAMIN Take 1,000 mcg by mouth daily.   vitamin C 500 MG tablet Commonly known as:  ASCORBIC ACID Take 500 mg by mouth daily.       Review of Systems  Constitutional: Negative for activity change, appetite change, chills, diaphoresis and fever.  HENT: Negative for congestion, sneezing, sore throat, trouble swallowing and voice change.   Respiratory: Negative for apnea, cough, choking, chest tightness, shortness of breath and wheezing.   Cardiovascular: Negative for chest pain, palpitations and leg swelling.  Gastrointestinal: Negative for abdominal distention, abdominal pain, constipation, diarrhea and nausea.  Genitourinary: Negative for difficulty urinating, dysuria, frequency and urgency.  Musculoskeletal: Positive for arthralgias (typical arthritis) and gait problem. Negative for back pain and  myalgias.  Skin: Negative for color change, pallor, rash and wound.  Neurological: Negative for dizziness, tremors, syncope, speech difficulty, weakness, numbness and headaches.  Psychiatric/Behavioral: Negative for agitation and behavioral problems.  All other systems reviewed and are negative.   Immunization History  Administered Date(s) Administered  . Influenza Inj Mdck Quad Pf 11/06/2015  . Influenza Split 10/19/2014  . Pneumococcal Polysaccharide-23 09/14/2007  . Tdap 10/17/2013   Pertinent  Health Maintenance Due  Topic Date Due  . HEMOGLOBIN A1C  [birth year]  . FOOT EXAM  01/23/1950  . OPHTHALMOLOGY EXAM  01/23/1950  . COLONOSCOPY  01/23/1991  . DEXA SCAN  01/22/2006  . PNA vac Low Risk Adult (1 of 2 - PCV13) 01/22/2006  . INFLUENZA VACCINE  08/12/2016   Fall Risk  09/07/2014 07/27/2014 07/02/2014 05/01/2014  Falls in the past year? Yes No No No  Number falls in past yr: 1 - - -  Injury with Fall? Yes - - -  Risk for fall due to : Medication side effect;History of fall(s) Medication side effect - Medication side effect  Follow up Education provided;Falls prevention discussed - - -   Functional Status Survey:    Vitals:   07/30/16 1124  BP: (!) 170/74  Pulse: 87  Resp: 19  Temp: (!) 97.4 F (36.3 C)  SpO2: 95%  Weight: 238 lb 9.6 oz (108.2 kg)  Height: 5\' 5"  (1.651 m)   Body mass index is 39.71 kg/m. Physical Exam  Constitutional: She is oriented to person, place, and time. Vital signs are normal. She appears well-developed and well-nourished. She is active and cooperative. She does not appear ill. No distress.  HENT:  Head: Normocephalic and atraumatic.  Mouth/Throat: Uvula is midline, oropharynx is clear and moist and mucous membranes are normal. Mucous membranes are not pale, not dry and not cyanotic.  Eyes: Pupils are equal, round, and reactive to light. Conjunctivae, EOM and lids are normal.  Neck: Trachea normal, normal range of motion and full passive  range of motion without pain. Neck supple. No JVD present. No tracheal deviation, no edema and no erythema present. No thyromegaly present.  Cardiovascular: Normal rate, regular rhythm, normal heart sounds, intact distal pulses and normal pulses.  Exam reveals no gallop, no distant heart sounds and no friction rub.   No murmur heard. Pulses:      Dorsalis pedis pulses are 2+ on the right side, and 2+ on  the left side.  Pulmonary/Chest: Effort normal. No accessory muscle usage. No respiratory distress. She has no decreased breath sounds. She has wheezes in the right upper field and the left upper field. She has no rhonchi. She has no rales. She exhibits no tenderness.  Abdominal: Normal appearance and bowel sounds are normal. She exhibits no distension and no ascites. There is no tenderness.  Musculoskeletal: She exhibits no edema or tenderness.       Left hip: She exhibits decreased range of motion, decreased strength and laceration.  Expected osteoarthritis, stiffness. Calves soft, supple. Negative Homan's sign  Neurological: She is alert and oriented to person, place, and time. She has normal strength.  Skin: Skin is warm and dry. Laceration (incision) noted. She is not diaphoretic. No cyanosis. No pallor. Nails show no clubbing.  Psychiatric: She has a normal mood and affect. Her speech is normal and behavior is normal. Judgment and thought content normal. Cognition and memory are normal.  Nursing note and vitals reviewed.   Labs reviewed:  Recent Labs  07/07/16 0918 07/21/16 1752 07/22/16 0425 07/23/16 0334  NA 139  --  138 134*  K 4.2  --  5.0 4.4  CL 107  --  111 107  CO2 25  --  22 22  GLUCOSE 120*  --  145* 163*  BUN 26*  --  23* 32*  CREATININE 1.86* 1.63* 1.75* 2.08*  CALCIUM 9.4  --  8.5* 8.1*   No results for input(s): AST, ALT, ALKPHOS, BILITOT, PROT, ALBUMIN in the last 8760 hours.  Recent Labs  07/21/16 1752 07/22/16 0425 07/23/16 0334  WBC 5.6 7.2 10.7  HGB  10.6* 9.7* 9.0*  HCT 31.0* 28.9* 26.3*  MCV 91.2 89.9 90.8  PLT 286 272 226   No results found for: TSH No results found for: HGBA1C No results found for: CHOL, HDL, LDLCALC, LDLDIRECT, TRIG, CHOLHDL  Significant Diagnostic Results in last 30 days:  Dg Hip Operative Unilat W Or W/o Pelvis Right  Result Date: 07/21/2016 CLINICAL DATA:  Elective surgery. EXAM: OPERATIVE RIGHT HIP (WITH PELVIS IF PERFORMED) 2 VIEWS TECHNIQUE: Fluoroscopic spot image(s) were submitted for interpretation post-operatively. COMPARISON:  None. FINDINGS: Two fluoroscopic spot images of the right hip. Initial image demonstrates osteoarthritis. Subsequently there is right hip arthroplasty. Radiation dose reported 5.20 mGy. Sidedness not discretely labeled, however appears to be the right hip by convention. IMPRESSION: Intra procedural fluoroscopy with right hip arthroplasty. Electronically Signed   By: Jeb Levering M.D.   On: 07/21/2016 16:13   Dg Hip Unilat W Or W/o Pelvis 2-3 Views Right  Result Date: 07/21/2016 CLINICAL DATA:  Post right total hip replacement EXAM: DG HIP (WITH OR WITHOUT PELVIS) 2-3V RIGHT COMPARISON:  None. FINDINGS: The components of the right total hip replacement appear to be in good position. No complicating features are seen. IMPRESSION: Right total hip replacement components in good position Electronically Signed   By: Ivar Drape M.D.   On: 07/21/2016 16:19    Assessment/Plan 1. Primary localized osteoarthritis of right hip  Continue PT and OT  Continue exercises as taught by PT and OT  Continue Lovenox 40 mg SQ Q Day  Continue Oxycodone 5 mg 1-2 tablets po Q 3 hours prn  Ice to hip prn  Skin care per protocol  2. Chronic obstructive pulmonary disease, unspecified COPD type (HCC)  Spiriva 18 mcg IH Q Day  Continue Proventil 90 mg 2 puffs Q 6 hours prn  Continue Symbicort 160-4.5 mcg  2 puffs BID  Family/ staff Communication:   Total Time:  Documentation:  Face to  Face:  Family/Phone:   Labs/tests ordered:    Medication list reviewed and assessed for continued appropriateness. Monthly medication orders reviewed and signed.  Vikki Ports, NP-C Geriatrics [facility] [phone], [place] [address] Cell Phone (Mon-Fri 8am-5pm):  [phone] On Call:  [phone] & follow prompts after 5pm & weekends Office Phone:  [phone] Office Fax:  [phone]

## 2016-08-05 ENCOUNTER — Encounter: Payer: Self-pay | Admitting: Gerontology

## 2016-08-05 ENCOUNTER — Non-Acute Institutional Stay (SKILLED_NURSING_FACILITY): Payer: Medicare HMO | Admitting: Gerontology

## 2016-08-05 DIAGNOSIS — M1611 Unilateral primary osteoarthritis, right hip: Secondary | ICD-10-CM | POA: Diagnosis not present

## 2016-08-05 DIAGNOSIS — J449 Chronic obstructive pulmonary disease, unspecified: Secondary | ICD-10-CM

## 2016-08-05 NOTE — Progress Notes (Signed)
Location:   [facility] Room Number: [place] of Service:  SNF [phone])  Provider: Toni Arthurs, NP-C  PCP: Idelle Crouch, MD Patient Care Team: Idelle Crouch, MD as PCP - General (Internal Medicine) Minor, Dalbert Garnet, RN as [facility] Management  Extended Emergency Contact Information Primary Emergency Contact: [place] Phone: [phone] Mobile Phone: [phone] Relation: Friend Secondary Emergency Contact: Alric Seton A Address: [address]          [place], [place] [address] [place] Phone: [phone] Relation: Niece  Code Status: FULL Goals of care:  Advanced Directive information Advanced Directives 08/05/2016  Does Patient Have a Medical Advance Directive? No  Type of Advance Directive -  Does patient want to make changes to medical advance directive? -  Copy of [place] in Chart? -  Would patient like information on creating a medical advance directive? -     Allergies  Allergen Reactions  . Hydralazine Itching  . Meloxicam Other (See Comments)    Causes excessive sleepiness fainting  . Penicillins Hives, Itching and Other (See Comments)    [place] Has patient had a PCN reaction causing immediate rash, facial/tongue/throat swelling, SOB or lightheadedness with hypotension: Yes Has patient had a PCN reaction causing severe rash involving mucus membranes or skin necrosis: No Has patient had a PCN reaction that required hospitalization: No Has patient had a PCN reaction occurring within the last 10 years: No If all of the above answers are "NO", then may proceed with Cephalosporin use.   . Pioglitazone Itching  . Tramadol Other (See Comments)    Causes excessive sleepiness Dizzy/fainting    Chief Complaint  Patient presents with  . Follow up    Hip replacement    HPI:  Pt is a 76 y.o. female seen today for follow up. Pt was  admitted to the facility for rehab following hospitalization for right Total Hip Arthroplasty. She has been participating in PT and OT. Therapy reports she is progressing slowly. Pt reports her pain is well controlled with current regimen. Appetite is good. Having regular BMs. Pt was noted to have wheezing on exam last week. Pt reports she uses her rescue inhaler 4-6 times per day at home. Pt had medication adjustments for her COPD, adding Spiriva. Today, pt denies wheezing, dyspnea. Otherwise, pt reports she is feeling well. VSS. No other complaints.        Past Medical History:  Diagnosis Date  . Anemia   . Anemia, unspecified    Hgb 11.8 - 04/2013  . Arthritis   . Cataract    Bilateral  . CHF (congestive heart failure) ([place])   . Chronic kidney disease   . COPD (chronic obstructive pulmonary disease) ([place])   . DDD (degenerative disc disease), lumbar   . Degenerative arthritis   . Diabetes mellitus type 2, uncomplicated (HCC)    AODM (A1C 6.9%) 04/2013  . Diabetes mellitus without complication ([place])   . Dyspnea    with exertion  . Esophagitis   . Gout    Uric acid 5.4 - 08/2012  . Hypertension   . Obesity, unspecified   . Sarcoidosis, lung ([place])    reported by pt, Clinically without a biopsy  . Sleep apnea    OSA--Use C-PAP    Past Surgical History:  Procedure Laterality Date  . ABDOMINAL HYSTERECTOMY     age 44  . COLONOSCOPY  11/02/2005  Hyperplastic Polyp: CBF 10/2015; Recall Ltr mailed 08/27/2015 (dw)  . FOOT SURGERY Right   . HALLUX VALGUS REPAIR Left   . OOPHORECTOMY    . 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 Her smoking use included Cigarettes. She started smoking about 54 years ago. She has a 2.10 pack-year smoking history. She has never used smokeless tobacco.  She reports that she does not drink alcohol or use drugs. Social History   Social History  . Marital status: Divorced    Spouse name: N/A  . Number of children: 1  . Years of education: 2 years college   Occupational History  . retired     worked at [employer]  . Smoking status: Former Smoker    Packs/day: 0.70    Years: 3.00    Types: Cigarettes    Start date: 01/12/1962    Quit date: 01/12/1966  . Smokeless tobacco: Never Used  . Alcohol use No  . Drug use: No  . Sexual activity: Not on file   Other Topics Concern  . Not on file   Social History Narrative  . No narrative on file   Functional Status Survey:    Allergies  Allergen Reactions  . Hydralazine Itching  . Meloxicam Other (See Comments)    Causes excessive sleepiness fainting  . Penicillins Hives, Itching and Other (See Comments)    [place] Has patient had a PCN reaction causing immediate rash, facial/tongue/throat swelling, SOB or lightheadedness with hypotension: Yes Has patient had a PCN reaction causing severe rash involving mucus membranes or skin necrosis: No Has patient had a PCN reaction that required hospitalization: No Has patient had a PCN reaction occurring within the last 10 years: No If all of the above answers are "NO", then may proceed with Cephalosporin use.   . Pioglitazone Itching  . Tramadol Other (See Comments)    Causes excessive sleepiness Dizzy/fainting    Pertinent  Health Maintenance Due  Topic Date Due  . HEMOGLOBIN A1C  11/24/1941  . FOOT EXAM  01/23/1951  . OPHTHALMOLOGY EXAM  01/23/1951  . COLONOSCOPY  01/23/1991  . DEXA SCAN  01/22/2006  . PNA vac Low Risk Adult (2 of 2 - PCV13) 09/13/2008  . INFLUENZA VACCINE  08/12/2016    Medications: Allergies as of 08/05/2016      Reactions   Hydralazine Itching   Meloxicam Other (See Comments)   Causes excessive sleepiness fainting   Penicillins Hives, Itching, Other (See Comments)   [place] Has  patient had a PCN reaction causing immediate rash, facial/tongue/throat swelling, SOB or lightheadedness with hypotension: Yes Has patient had a PCN reaction causing severe rash involving mucus membranes or skin necrosis: No Has patient had a PCN reaction that required hospitalization: No Has patient had a PCN reaction occurring within the last 10 years: No If all of the above answers are "NO", then may proceed with Cephalosporin use.   Pioglitazone Itching   Tramadol Other (See Comments)   Causes excessive sleepiness Dizzy/fainting      Medication List       Accurate as of 08/05/16 12:10 PM. Always use your most recent med list.          albuterol 108 (90 Base) MCG/ACT inhaler Commonly known as:  PROVENTIL HFA;VENTOLIN HFA Inhale 2 puffs into the lungs every 6 (six) hours as needed for wheezing or shortness of breath.   amLODipine 5 MG tablet Commonly known as:  NORVASC Take 5 mg by mouth daily.   brimonidine 0.15 % ophthalmic solution Commonly known as:  ALPHAGAN Place 1 drop into both eyes 3 (three) times daily.   budesonide-formoterol 160-4.5 MCG/ACT inhaler Commonly known as:  SYMBICORT Inhale 2 puffs into the lungs 2 (two) times daily.   CALCIUM 600/VITAMIN D 600-400 MG-UNIT Tabs Generic drug:  Calcium Carbonate-Vitamin D3 Take 2 tablets by mouth daily.   cetirizine 10 MG tablet Commonly known as:  ZYRTEC Take 10 mg by mouth at bedtime.   diclofenac sodium 1 % Gel Commonly known as:  VOLTAREN Apply topically 2 (two) times daily as needed (pain).   enoxaparin 40 MG/0.4ML injection Commonly known as:  LOVENOX Inject 0.4 mLs (40 mg total) into the skin daily.   fexofenadine 180 MG tablet Commonly known as:  ALLEGRA Take 180 mg by mouth daily.   glipiZIDE 10 MG 24 hr tablet Commonly known as:  GLUCOTROL XL Take 10 mg by mouth 2 (two) times daily.   ipratropium-albuterol 0.5-2.5 (3) MG/3ML Soln Commonly known as:  DUONEB Take 3 mLs by nebulization every 6  (six) hours as needed (shortness of breath or wheezing).   isosorbide mononitrate 60 MG 24 hr tablet Commonly known as:  IMDUR Take 180 mg by mouth daily.   losartan 50 MG tablet Commonly known as:  COZAAR Take 100 mg by mouth daily.   metFORMIN 1000 MG tablet Commonly known as:  GLUCOPHAGE Take 500 mg by mouth 2 (two) times daily with a meal.   mometasone 50 MCG/ACT nasal spray Commonly known as:  NASONEX Place 2 sprays into the nose daily as needed (allergies).   montelukast 10 MG tablet Commonly known as:  SINGULAIR Take 10 mg by mouth every evening.   oxyCODONE 5 MG immediate release tablet Commonly known as:  Oxy IR/ROXICODONE Take 1-2 tablets (5-10 mg total) by mouth every 3 (three) hours as needed for breakthrough pain.   sitaGLIPtin 50 MG tablet Commonly known as:  JANUVIA Take 50 mg by mouth daily.   spironolactone 25 MG tablet Commonly known as:  ALDACTONE Take 25 mg by mouth daily.   tiotropium 18 MCG inhalation capsule Commonly known as:  SPIRIVA Place 18 mcg into inhaler and inhale daily.   ULORIC 40 MG tablet Generic drug:  febuxostat Take 40 mg by mouth daily.   vitamin B-12 1000 MCG tablet Commonly known as:  CYANOCOBALAMIN Take 1,000 mcg by mouth daily.   vitamin C 500 MG tablet Commonly known as:  ASCORBIC ACID Take 500 mg by mouth daily.       Review of Systems  Constitutional: Negative for activity change, appetite change, chills, diaphoresis and fever.  HENT: Negative for congestion, sneezing, sore throat, trouble swallowing and voice change.   Respiratory: Negative for apnea, cough, choking, chest tightness, shortness of breath and wheezing.   Cardiovascular: Negative for chest pain, palpitations and leg swelling.  Gastrointestinal: Negative for abdominal distention, abdominal pain, constipation, diarrhea and nausea.  Genitourinary: Negative for difficulty urinating, dysuria, frequency and urgency.  Musculoskeletal: Positive for  arthralgias (typical arthritis) and gait problem. Negative for back pain and myalgias.  Skin: Negative for color change, pallor, rash and wound.  Neurological: Negative for dizziness, tremors, syncope, speech difficulty, weakness, numbness and headaches.  Psychiatric/Behavioral: Negative for agitation and behavioral problems.  All  other systems reviewed and are negative.   Vitals:   08/05/16 1203  BP: 128/77  Pulse: 67  Resp: 18  Temp: 98.1 F (36.7 C)  SpO2: 96%  Weight: 237 lb 4.8 oz (107.6 kg)  Height: 5\' 5"  (1.651 m)   Body mass index is 39.49 kg/m. Physical Exam  Constitutional: She is oriented to person, place, and time. Vital signs are normal. She appears well-developed and well-nourished. She is active and cooperative. She does not appear ill. No distress.  HENT:  Head: Normocephalic and atraumatic.  Mouth/Throat: Uvula is midline, oropharynx is clear and moist and mucous membranes are normal. Mucous membranes are not pale, not dry and not cyanotic.  Eyes: Pupils are equal, round, and reactive to light. Conjunctivae, EOM and lids are normal.  Neck: Trachea normal, normal range of motion and full passive range of motion without pain. Neck supple. No JVD present. No tracheal deviation, no edema and no erythema present. No thyromegaly present.  Cardiovascular: Normal rate, regular rhythm, normal heart sounds, intact distal pulses and normal pulses.  Exam reveals no gallop, no distant heart sounds and no friction rub.   No murmur heard. Pulses:      Dorsalis pedis pulses are 2+ on the right side, and 2+ on the left side.  Pulmonary/Chest: Effort normal and breath sounds normal. No accessory muscle usage. No respiratory distress. She has no decreased breath sounds. She has no wheezes. She has no rhonchi. She has no rales. She exhibits no tenderness.  Abdominal: Normal appearance and bowel sounds are normal. She exhibits no distension and no ascites. There is no tenderness.    Musculoskeletal: She exhibits no edema or tenderness.       Left hip: She exhibits decreased range of motion, decreased strength and laceration.  Expected osteoarthritis, stiffness. Calves soft, supple. Negative Homan's sign  Neurological: She is alert and oriented to person, place, and time. She has normal strength.  Skin: Skin is warm and dry. Laceration (incision) noted. She is not diaphoretic. No cyanosis. No pallor. Nails show no clubbing.  Psychiatric: She has a normal mood and affect. Her speech is normal and behavior is normal. Judgment and thought content normal. Cognition and memory are normal.  Nursing note and vitals reviewed.   Labs reviewed: Basic Metabolic Panel:  Recent Labs  07/07/16 0918 07/21/16 1752 07/22/16 0425 07/23/16 0334  NA 139  --  138 134*  K 4.2  --  5.0 4.4  CL 107  --  111 107  CO2 25  --  22 22  GLUCOSE 120*  --  145* 163*  BUN 26*  --  23* 32*  CREATININE 1.86* 1.63* 1.75* 2.08*  CALCIUM 9.4  --  8.5* 8.1*   Liver Function Tests: No results for input(s): AST, ALT, ALKPHOS, BILITOT, PROT, ALBUMIN in the last 8760 hours. No results for input(s): LIPASE, AMYLASE in the last 8760 hours. No results for input(s): AMMONIA in the last 8760 hours. CBC:  Recent Labs  07/21/16 1752 07/22/16 0425 07/23/16 0334  WBC 5.6 7.2 10.7  HGB 10.6* 9.7* 9.0*  HCT 31.0* 28.9* 26.3*  MCV 91.2 89.9 90.8  PLT 286 272 226   Cardiac Enzymes: No results for input(s): CKTOTAL, CKMB, CKMBINDEX, TROPONINI in the last 8760 hours. BNP: Invalid input(s): POCBNP CBG:  Recent Labs  07/22/16 2101 07/23/16 0733 07/23/16 1155  GLUCAP 138* 155* 200*    Procedures and Imaging Studies During Stay: Dg Hip Operative Unilat W Or W/o Pelvis Right  Result Date: 07/21/2016 CLINICAL DATA:  Elective surgery. EXAM: OPERATIVE RIGHT HIP (WITH PELVIS IF PERFORMED) 2 VIEWS TECHNIQUE: Fluoroscopic spot image(s) were submitted for interpretation post-operatively. COMPARISON:   None. FINDINGS: Two fluoroscopic spot images of the right hip. Initial image demonstrates osteoarthritis. Subsequently there is right hip arthroplasty. Radiation dose reported 5.20 mGy. Sidedness not discretely labeled, however appears to be the right hip by convention. IMPRESSION: Intra procedural fluoroscopy with right hip arthroplasty. Electronically Signed   By: Jeb Levering M.D.   On: 07/21/2016 16:13   Dg Hip Unilat W Or W/o Pelvis 2-3 Views Right  Result Date: 07/21/2016 CLINICAL DATA:  Post right total hip replacement EXAM: DG HIP (WITH OR WITHOUT PELVIS) 2-3V RIGHT COMPARISON:  None. FINDINGS: The components of the right total hip replacement appear to be in good position. No complicating features are seen. IMPRESSION: Right total hip replacement components in good position Electronically Signed   By: Ivar Drape M.D.   On: 07/21/2016 16:19    Assessment/Plan:   1. Primary localized osteoarthritis of right hip  Continue PT and OT  Continue exercises as taught by PT and OT  Continue Lovenox 40 mg SQ Q Day  Continue Oxycodone 5 mg 1-2 tablets po Q 3 hours prn  Ice to hip prn  Skin care per protocol  2. Chronic obstructive pulmonary disease, unspecified COPD type (HCC)  Continue Spiriva 18 mcg IH Q Day  Continue Proventil 90 mg 2 puffs Q 6 hours prn  Continue Symbicort 160-4.5 mcg 2 puffs BID  Future labs/tests needed:   Family/ staff Communication:   Total Time:  Documentation:  Face to Face:  Family/Phone:  Vikki Ports, NP-C [facility] [address], [place] [address] Cell Phone (Mon-Fri 8am-5pm):  [phone] On Call:  [phone] & follow prompts after 5pm & weekends Office Phone:  [phone] Office Fax:  [phone]

## 2016-08-11 ENCOUNTER — Non-Acute Institutional Stay (SKILLED_NURSING_FACILITY): Payer: Medicare HMO | Admitting: Gerontology

## 2016-08-11 DIAGNOSIS — J449 Chronic obstructive pulmonary disease, unspecified: Secondary | ICD-10-CM | POA: Diagnosis not present

## 2016-08-11 DIAGNOSIS — M1611 Unilateral primary osteoarthritis, right hip: Secondary | ICD-10-CM

## 2016-08-11 NOTE — Progress Notes (Signed)
Location:   [facility] Service:  SNF [phone])  Provider: Toni Arthurs, NP-C  PCP: Idelle Crouch, MD Patient Care Team: Idelle Crouch, MD as PCP - General (Internal Medicine) Minor, Dalbert Garnet, RN as [place] Management  Extended Emergency Contact Information Primary Emergency Contact: Dona [place] of [place] Phone: [phone] Mobile Phone: [phone] Relation: Friend Secondary Emergency Contact: Alric Seton A Address: [address]          [place], [place] [address] [place] Phone: [phone] Relation: Niece  Code Status: FULL Goals of care:  Advanced Directive information Advanced Directives 08/05/2016  Does Patient Have a Medical Advance Directive? No  Type of Advance Directive -  Does patient want to make changes to medical advance directive? -  Copy of [place] in Chart? -  Would patient like information on creating a medical advance directive? -     Allergies  Allergen Reactions  . Hydralazine Itching  . Meloxicam Other (See Comments)    Causes excessive sleepiness fainting  . Penicillins Hives, Itching and Other (See Comments)    [place] Has patient had a PCN reaction causing immediate rash, facial/tongue/throat swelling, SOB or lightheadedness with hypotension: Yes Has patient had a PCN reaction causing severe rash involving mucus membranes or skin necrosis: No Has patient had a PCN reaction that required hospitalization: No Has patient had a PCN reaction occurring within the last 10 years: No If all of the above answers are "NO", then may proceed with Cephalosporin use.   . Pioglitazone Itching  . Tramadol Other (See Comments)    Causes excessive sleepiness Dizzy/fainting    Chief Complaint  Patient presents with  . Follow up    Hip replacement    HPI:  Pt is a 76 y.o. female seen today for follow up. Pt was admitted to the facility for  rehab following hospitalization for right Total Hip Arthroplasty. She has been participating in PT and OT. Therapy reports she is progressing slowly. Pt reports her pain is well controlled with current regimen. Appetite is good. Having regular BMs. Pt was noted to have wheezing on exam last week. Pt reports she uses her rescue inhaler 4-6 times per day at home. Pt had medication adjustments for her COPD, adding Spiriva. Today, pt denies wheezing, dyspnea. Otherwise, pt reports she is feeling well and ready to discharge home. VSS. No other complaints.        Past Medical History:  Diagnosis Date  . Anemia   . Anemia, unspecified    Hgb 11.8 - 04/2013  . Arthritis   . Cataract    Bilateral  . CHF (congestive heart failure) ([place])   . Chronic kidney disease   . COPD (chronic obstructive pulmonary disease) ([place])   . DDD (degenerative disc disease), lumbar   . Degenerative arthritis   . Diabetes mellitus type 2, uncomplicated (HCC)    AODM (A1C 6.9%) 04/2013  . Diabetes mellitus without complication ([place])   . Dyspnea    with exertion  . Esophagitis   . Gout    Uric acid 5.4 - 08/2012  . Hypertension   . Obesity, unspecified   . Sarcoidosis, lung ([place])    reported by pt, Clinically without a biopsy  . Sleep apnea    OSA--Use C-PAP    Past Surgical History:  Procedure Laterality Date  . ABDOMINAL HYSTERECTOMY     age 75  . COLONOSCOPY  11/02/2005   Hyperplastic Polyp: CBF 10/2015; Recall Ltr mailed 08/27/2015 (dw)  . FOOT SURGERY Right   . HALLUX VALGUS REPAIR Left   . OOPHORECTOMY    . 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 Her smoking use included Cigarettes. She started smoking about 54 years ago. She has a 2.10 pack-year smoking history. She has never used smokeless tobacco.  She reports that she does not drink alcohol or use drugs. Social History   Social History  . Marital status: Divorced    Spouse name: N/A  . Number of children: 1  . Years of education: 2 years college   Occupational History  . retired     worked at [employer]  . Smoking status: Former Smoker    Packs/day: 0.70    Years: 3.00    Types: Cigarettes    Start date: 01/12/1962    Quit date: 01/12/1966  . Smokeless tobacco: Never Used  . Alcohol use No  . Drug use: No  . Sexual activity: Not on file   Other Topics Concern  . Not on file   Social History Narrative  . No narrative on file   Functional Status Survey:    Allergies  Allergen Reactions  . Hydralazine Itching  . Meloxicam Other (See Comments)    Causes excessive sleepiness fainting  . Penicillins Hives, Itching and Other (See Comments)    [place] Has patient had a PCN reaction causing immediate rash, facial/tongue/throat swelling, SOB or lightheadedness with hypotension: Yes Has patient had a PCN reaction causing severe rash involving mucus membranes or skin necrosis: No Has patient had a PCN reaction that required hospitalization: No Has patient had a PCN reaction occurring within the last 10 years: No If all of the above answers are "NO", then may proceed with Cephalosporin use.   . Pioglitazone Itching  . Tramadol Other (See Comments)    Causes excessive sleepiness Dizzy/fainting    Pertinent  Health Maintenance Due  Topic Date Due  . HEMOGLOBIN A1C  24-Dec-1940  . FOOT EXAM  01/[birth year]/1951  . OPHTHALMOLOGY EXAM  [birth year]/1951  . COLONOSCOPY  01/23/1991  . DEXA SCAN  01/22/2006  . PNA vac Low Risk Adult (2 of 2 - PCV13) 09/13/2008  . INFLUENZA VACCINE  08/12/2016    Medications: Allergies as of 08/11/2016      Reactions   Hydralazine Itching   Meloxicam Other (See Comments)   Causes excessive sleepiness fainting   Penicillins Hives, Itching, Other (See Comments)   [place] Has  patient had a PCN reaction causing immediate rash, facial/tongue/throat swelling, SOB or lightheadedness with hypotension: Yes Has patient had a PCN reaction causing severe rash involving mucus membranes or skin necrosis: No Has patient had a PCN reaction that required hospitalization: No Has patient had a PCN reaction occurring within the last 10 years: No If all of the above answers are "NO", then may proceed with Cephalosporin use.   Pioglitazone Itching   Tramadol Other (See Comments)   Causes excessive sleepiness Dizzy/fainting      Medication List       Accurate as of 08/11/16  9:50 PM. Always use your most recent med list.          albuterol 108 (90 Base) MCG/ACT inhaler  Commonly known as:  PROVENTIL HFA;VENTOLIN HFA Inhale 2 puffs into the lungs every 6 (six) hours as needed for wheezing or shortness of breath.   amLODipine 5 MG tablet Commonly known as:  NORVASC Take 5 mg by mouth daily.   brimonidine 0.15 % ophthalmic solution Commonly known as:  ALPHAGAN Place 1 drop into both eyes 3 (three) times daily.   budesonide-formoterol 160-4.5 MCG/ACT inhaler Commonly known as:  SYMBICORT Inhale 2 puffs into the lungs 2 (two) times daily.   CALCIUM 600/VITAMIN D 600-400 MG-UNIT Tabs Generic drug:  Calcium Carbonate-Vitamin D3 Take 2 tablets by mouth daily.   cetirizine 10 MG tablet Commonly known as:  ZYRTEC Take 10 mg by mouth at bedtime.   diclofenac sodium 1 % Gel Commonly known as:  VOLTAREN Apply topically 2 (two) times daily as needed (pain).   fexofenadine 180 MG tablet Commonly known as:  ALLEGRA Take 180 mg by mouth daily.   glipiZIDE 10 MG 24 hr tablet Commonly known as:  GLUCOTROL XL Take 10 mg by mouth 2 (two) times daily.   ipratropium-albuterol 0.5-2.5 (3) MG/3ML Soln Commonly known as:  DUONEB Take 3 mLs by nebulization every 6 (six) hours as needed (shortness of breath or wheezing).   isosorbide mononitrate 60 MG 24 hr tablet Commonly  known as:  IMDUR Take 180 mg by mouth daily.   losartan 50 MG tablet Commonly known as:  COZAAR Take 100 mg by mouth daily.   magnesium hydroxide 400 MG/5ML suspension Commonly known as:  MILK OF MAGNESIA Take 30 mLs by mouth every 4 (four) hours as needed. If constipation/ no BM for 2 days   metFORMIN 500 MG tablet Commonly known as:  GLUCOPHAGE Take 500 mg by mouth 2 (two) times daily with a meal.   mometasone 50 MCG/ACT nasal spray Commonly known as:  NASONEX Place 2 sprays into the nose daily as needed (allergies).   montelukast 10 MG tablet Commonly known as:  SINGULAIR Take 10 mg by mouth every evening.   oxyCODONE 5 MG immediate release tablet Commonly known as:  Oxy IR/ROXICODONE Take 1-2 tablets (5-10 mg total) by mouth every 3 (three) hours as needed for breakthrough pain.   sitaGLIPtin 50 MG tablet Commonly known as:  JANUVIA Take 50 mg by mouth daily.   spironolactone 25 MG tablet Commonly known as:  ALDACTONE Take 25 mg by mouth daily.   tiotropium 18 MCG inhalation capsule Commonly known as:  SPIRIVA Place 18 mcg into inhaler and inhale daily.   ULORIC 40 MG tablet Generic drug:  febuxostat Take 40 mg by mouth daily.   vitamin B-12 1000 MCG tablet Commonly known as:  CYANOCOBALAMIN Take 1,000 mcg by mouth daily.   vitamin C 500 MG tablet Commonly known as:  ASCORBIC ACID Take 500 mg by mouth daily.       Review of Systems  Constitutional: Negative for activity change, appetite change, chills, diaphoresis and fever.  HENT: Negative for congestion, sneezing, sore throat, trouble swallowing and voice change.   Respiratory: Negative for apnea, cough, choking, chest tightness, shortness of breath and wheezing.   Cardiovascular: Negative for chest pain, palpitations and leg swelling.  Gastrointestinal: Negative for abdominal distention, abdominal pain, constipation, diarrhea and nausea.  Genitourinary: Negative for difficulty urinating, dysuria,  frequency and urgency.  Musculoskeletal: Positive for arthralgias (typical arthritis) and gait problem. Negative for back pain and myalgias.  Skin: Negative for color change, pallor, rash and wound.  Neurological: Negative for dizziness, tremors, syncope, speech  difficulty, weakness, numbness and headaches.  Psychiatric/Behavioral: Negative for agitation and behavioral problems.  All other systems reviewed and are negative.   Vitals:   08/11/16 0846  BP: (!) 178/80  Weight: 235 lb (106.6 kg)   Body mass index is 39.11 kg/m. Physical Exam  Constitutional: She is oriented to person, place, and time. Vital signs are normal. She appears well-developed and well-nourished. She is active and cooperative. She does not appear ill. No distress.  HENT:  Head: Normocephalic and atraumatic.  Mouth/Throat: Uvula is midline, oropharynx is clear and moist and mucous membranes are normal. Mucous membranes are not pale, not dry and not cyanotic.  Eyes: Pupils are equal, round, and reactive to light. Conjunctivae, EOM and lids are normal.  Neck: Trachea normal, normal range of motion and full passive range of motion without pain. Neck supple. No JVD present. No tracheal deviation, no edema and no erythema present. No thyromegaly present.  Cardiovascular: Normal rate, regular rhythm, normal heart sounds, intact distal pulses and normal pulses.  Exam reveals no gallop, no distant heart sounds and no friction rub.   No murmur heard. Pulses:      Dorsalis pedis pulses are 2+ on the right side, and 2+ on the left side.  Pulmonary/Chest: Effort normal and breath sounds normal. No accessory muscle usage. No respiratory distress. She has no decreased breath sounds. She has no wheezes. She has no rhonchi. She has no rales. She exhibits no tenderness.  Abdominal: Normal appearance and bowel sounds are normal. She exhibits no distension and no ascites. There is no tenderness.  Musculoskeletal: She exhibits no edema or  tenderness.       Left hip: She exhibits decreased range of motion, decreased strength and laceration.  Expected osteoarthritis, stiffness. Calves soft, supple. Negative Homan's sign  Neurological: She is alert and oriented to person, place, and time. She has normal strength.  Skin: Skin is warm and dry. Laceration (incision) noted. She is not diaphoretic. No cyanosis. No pallor. Nails show no clubbing.  Psychiatric: She has a normal mood and affect. Her speech is normal and behavior is normal. Judgment and thought content normal. Cognition and memory are normal.  Nursing note and vitals reviewed.   Labs reviewed: Basic Metabolic Panel:  Recent Labs  07/07/16 0918 07/21/16 1752 07/22/16 0425 07/23/16 0334  NA 139  --  138 134*  K 4.2  --  5.0 4.4  CL 107  --  111 107  CO2 25  --  22 22  GLUCOSE 120*  --  145* 163*  BUN 26*  --  23* 32*  CREATININE 1.86* 1.63* 1.75* 2.08*  CALCIUM 9.4  --  8.5* 8.1*   Liver Function Tests: No results for input(s): AST, ALT, ALKPHOS, BILITOT, PROT, ALBUMIN in the last 8760 hours. No results for input(s): LIPASE, AMYLASE in the last 8760 hours. No results for input(s): AMMONIA in the last 8760 hours. CBC:  Recent Labs  07/21/16 1752 07/22/16 0425 07/23/16 0334  WBC 5.6 7.2 10.7  HGB 10.6* 9.7* 9.0*  HCT 31.0* 28.9* 26.3*  MCV 91.2 89.9 90.8  PLT 286 272 226   Cardiac Enzymes: No results for input(s): CKTOTAL, CKMB, CKMBINDEX, TROPONINI in the last 8760 hours. BNP: Invalid input(s): POCBNP CBG:  Recent Labs  07/22/16 2101 07/23/16 0733 07/23/16 1155  GLUCAP 138* 155* 200*    Procedures and Imaging Studies During Stay: Dg Hip Operative Unilat W Or W/o Pelvis Right  Result Date: 07/21/2016 CLINICAL DATA:  Elective  surgery. EXAM: OPERATIVE RIGHT HIP (WITH PELVIS IF PERFORMED) 2 VIEWS TECHNIQUE: Fluoroscopic spot image(s) were submitted for interpretation post-operatively. COMPARISON:  None. FINDINGS: Two fluoroscopic spot images  of the right hip. Initial image demonstrates osteoarthritis. Subsequently there is right hip arthroplasty. Radiation dose reported 5.20 mGy. Sidedness not discretely labeled, however appears to be the right hip by convention. IMPRESSION: Intra procedural fluoroscopy with right hip arthroplasty. Electronically Signed   By: Jeb Levering M.D.   On: 07/21/2016 16:13   Dg Hip Unilat W Or W/o Pelvis 2-3 Views Right  Result Date: 07/21/2016 CLINICAL DATA:  Post right total hip replacement EXAM: DG HIP (WITH OR WITHOUT PELVIS) 2-3V RIGHT COMPARISON:  None. FINDINGS: The components of the right total hip replacement appear to be in good position. No complicating features are seen. IMPRESSION: Right total hip replacement components in good position Electronically Signed   By: Ivar Drape M.D.   On: 07/21/2016 16:19    Assessment/Plan:   1. Primary localized osteoarthritis of right hip  Continue PT and OT  Continue exercises as taught by PT and OT  Continue Lovenox 40 mg SQ Q Day  Continue Oxycodone 5 mg 1-2 tablets po Q 3 hours prn  Ice to hip prn  Skin care per protocol  Follow up with Orthopedist asap after discharge for continuity of care  2. Chronic obstructive pulmonary disease, unspecified COPD type (HCC)  Continue Spiriva 18 mcg IH Q Day  Continue Proventil 90 mg 2 puffs Q 6 hours prn  Continue Symbicort 160-4.5 mcg 2 puffs BID  Follow up with PCP or Pulmonologist asap after discharge for continuity of care  Patient is being discharged with the following home health services: HHPT/OT/RN/CNA through [facility]    Patient is being discharged with the following durable medical equipment: RW   Patient has been advised to f/u with their PCP in 1-2 weeks to bring them up to date on their rehab stay.  Social services at facility was responsible for arranging this appointment.  Pt was provided with a 30 day supply of prescriptions for medications and refills must be obtained from  their PCP.  For controlled substances, a more limited supply may be provided adequate until PCP appointment only.  Future labs/tests needed:   Family/ staff Communication:   Total Time:  Documentation:  Face to Face:  Family/Phone:  Vikki Ports, NP-C [facility] [address], [place] [address] Cell Phone (Mon-Fri 8am-5pm):  [phone] On Call:  [phone] & follow prompts after 5pm & weekends Office Phone:  [phone] Office Fax:  [phone]

## 2016-08-12 ENCOUNTER — Encounter: Admission: RE | Admit: 2016-08-12 | Payer: Medicare HMO | Source: Ambulatory Visit | Admitting: Internal Medicine

## 2016-08-13 DIAGNOSIS — M1A00X0 Idiopathic chronic gout, unspecified site, without tophus (tophi): Secondary | ICD-10-CM | POA: Diagnosis not present

## 2016-08-13 DIAGNOSIS — G4733 Obstructive sleep apnea (adult) (pediatric): Secondary | ICD-10-CM | POA: Diagnosis not present

## 2016-08-13 DIAGNOSIS — Z96641 Presence of right artificial hip joint: Secondary | ICD-10-CM | POA: Diagnosis not present

## 2016-08-13 DIAGNOSIS — R262 Difficulty in walking, not elsewhere classified: Secondary | ICD-10-CM | POA: Diagnosis not present

## 2016-08-13 DIAGNOSIS — Z471 Aftercare following joint replacement surgery: Secondary | ICD-10-CM | POA: Diagnosis not present

## 2016-08-14 DIAGNOSIS — I5032 Chronic diastolic (congestive) heart failure: Secondary | ICD-10-CM | POA: Diagnosis not present

## 2016-08-14 DIAGNOSIS — M5136 Other intervertebral disc degeneration, lumbar region: Secondary | ICD-10-CM | POA: Diagnosis not present

## 2016-08-14 DIAGNOSIS — I110 Hypertensive heart disease with heart failure: Secondary | ICD-10-CM | POA: Diagnosis not present

## 2016-08-14 DIAGNOSIS — Z471 Aftercare following joint replacement surgery: Secondary | ICD-10-CM | POA: Diagnosis not present

## 2016-08-14 DIAGNOSIS — J431 Panlobular emphysema: Secondary | ICD-10-CM | POA: Diagnosis not present

## 2016-08-14 DIAGNOSIS — E1122 Type 2 diabetes mellitus with diabetic chronic kidney disease: Secondary | ICD-10-CM | POA: Diagnosis not present

## 2016-08-17 DIAGNOSIS — Z471 Aftercare following joint replacement surgery: Secondary | ICD-10-CM | POA: Diagnosis not present

## 2016-08-17 DIAGNOSIS — I110 Hypertensive heart disease with heart failure: Secondary | ICD-10-CM | POA: Diagnosis not present

## 2016-08-17 DIAGNOSIS — M5136 Other intervertebral disc degeneration, lumbar region: Secondary | ICD-10-CM | POA: Diagnosis not present

## 2016-08-17 DIAGNOSIS — J431 Panlobular emphysema: Secondary | ICD-10-CM | POA: Diagnosis not present

## 2016-08-17 DIAGNOSIS — I5032 Chronic diastolic (congestive) heart failure: Secondary | ICD-10-CM | POA: Diagnosis not present

## 2016-08-17 DIAGNOSIS — E1122 Type 2 diabetes mellitus with diabetic chronic kidney disease: Secondary | ICD-10-CM | POA: Diagnosis not present

## 2016-08-18 DIAGNOSIS — Z471 Aftercare following joint replacement surgery: Secondary | ICD-10-CM | POA: Diagnosis not present

## 2016-08-18 DIAGNOSIS — I110 Hypertensive heart disease with heart failure: Secondary | ICD-10-CM | POA: Diagnosis not present

## 2016-08-18 DIAGNOSIS — E1122 Type 2 diabetes mellitus with diabetic chronic kidney disease: Secondary | ICD-10-CM | POA: Diagnosis not present

## 2016-08-18 DIAGNOSIS — J431 Panlobular emphysema: Secondary | ICD-10-CM | POA: Diagnosis not present

## 2016-08-18 DIAGNOSIS — I5032 Chronic diastolic (congestive) heart failure: Secondary | ICD-10-CM | POA: Diagnosis not present

## 2016-08-18 DIAGNOSIS — M5136 Other intervertebral disc degeneration, lumbar region: Secondary | ICD-10-CM | POA: Diagnosis not present

## 2016-08-19 DIAGNOSIS — Z471 Aftercare following joint replacement surgery: Secondary | ICD-10-CM | POA: Diagnosis not present

## 2016-08-19 DIAGNOSIS — J431 Panlobular emphysema: Secondary | ICD-10-CM | POA: Diagnosis not present

## 2016-08-19 DIAGNOSIS — I5032 Chronic diastolic (congestive) heart failure: Secondary | ICD-10-CM | POA: Diagnosis not present

## 2016-08-19 DIAGNOSIS — E1122 Type 2 diabetes mellitus with diabetic chronic kidney disease: Secondary | ICD-10-CM | POA: Diagnosis not present

## 2016-08-19 DIAGNOSIS — M5136 Other intervertebral disc degeneration, lumbar region: Secondary | ICD-10-CM | POA: Diagnosis not present

## 2016-08-19 DIAGNOSIS — I110 Hypertensive heart disease with heart failure: Secondary | ICD-10-CM | POA: Diagnosis not present

## 2016-08-21 DIAGNOSIS — I110 Hypertensive heart disease with heart failure: Secondary | ICD-10-CM | POA: Diagnosis not present

## 2016-08-21 DIAGNOSIS — Z471 Aftercare following joint replacement surgery: Secondary | ICD-10-CM | POA: Diagnosis not present

## 2016-08-21 DIAGNOSIS — E1122 Type 2 diabetes mellitus with diabetic chronic kidney disease: Secondary | ICD-10-CM | POA: Diagnosis not present

## 2016-08-21 DIAGNOSIS — M5136 Other intervertebral disc degeneration, lumbar region: Secondary | ICD-10-CM | POA: Diagnosis not present

## 2016-08-21 DIAGNOSIS — I5032 Chronic diastolic (congestive) heart failure: Secondary | ICD-10-CM | POA: Diagnosis not present

## 2016-08-21 DIAGNOSIS — J431 Panlobular emphysema: Secondary | ICD-10-CM | POA: Diagnosis not present

## 2016-08-24 DIAGNOSIS — J431 Panlobular emphysema: Secondary | ICD-10-CM | POA: Diagnosis not present

## 2016-08-24 DIAGNOSIS — Z471 Aftercare following joint replacement surgery: Secondary | ICD-10-CM | POA: Diagnosis not present

## 2016-08-24 DIAGNOSIS — E1122 Type 2 diabetes mellitus with diabetic chronic kidney disease: Secondary | ICD-10-CM | POA: Diagnosis not present

## 2016-08-24 DIAGNOSIS — M5136 Other intervertebral disc degeneration, lumbar region: Secondary | ICD-10-CM | POA: Diagnosis not present

## 2016-08-24 DIAGNOSIS — I110 Hypertensive heart disease with heart failure: Secondary | ICD-10-CM | POA: Diagnosis not present

## 2016-08-24 DIAGNOSIS — I5032 Chronic diastolic (congestive) heart failure: Secondary | ICD-10-CM | POA: Diagnosis not present

## 2016-08-25 DIAGNOSIS — M5136 Other intervertebral disc degeneration, lumbar region: Secondary | ICD-10-CM | POA: Diagnosis not present

## 2016-08-25 DIAGNOSIS — I5032 Chronic diastolic (congestive) heart failure: Secondary | ICD-10-CM | POA: Diagnosis not present

## 2016-08-25 DIAGNOSIS — J431 Panlobular emphysema: Secondary | ICD-10-CM | POA: Diagnosis not present

## 2016-08-25 DIAGNOSIS — Z471 Aftercare following joint replacement surgery: Secondary | ICD-10-CM | POA: Diagnosis not present

## 2016-08-25 DIAGNOSIS — I110 Hypertensive heart disease with heart failure: Secondary | ICD-10-CM | POA: Diagnosis not present

## 2016-08-25 DIAGNOSIS — E1122 Type 2 diabetes mellitus with diabetic chronic kidney disease: Secondary | ICD-10-CM | POA: Diagnosis not present

## 2016-08-26 DIAGNOSIS — Z471 Aftercare following joint replacement surgery: Secondary | ICD-10-CM | POA: Diagnosis not present

## 2016-08-26 DIAGNOSIS — M5136 Other intervertebral disc degeneration, lumbar region: Secondary | ICD-10-CM | POA: Diagnosis not present

## 2016-08-26 DIAGNOSIS — I110 Hypertensive heart disease with heart failure: Secondary | ICD-10-CM | POA: Diagnosis not present

## 2016-08-26 DIAGNOSIS — J431 Panlobular emphysema: Secondary | ICD-10-CM | POA: Diagnosis not present

## 2016-08-26 DIAGNOSIS — I5032 Chronic diastolic (congestive) heart failure: Secondary | ICD-10-CM | POA: Diagnosis not present

## 2016-08-26 DIAGNOSIS — E1122 Type 2 diabetes mellitus with diabetic chronic kidney disease: Secondary | ICD-10-CM | POA: Diagnosis not present

## 2016-08-27 DIAGNOSIS — Z471 Aftercare following joint replacement surgery: Secondary | ICD-10-CM | POA: Diagnosis not present

## 2016-08-27 DIAGNOSIS — I5032 Chronic diastolic (congestive) heart failure: Secondary | ICD-10-CM | POA: Diagnosis not present

## 2016-08-27 DIAGNOSIS — E1122 Type 2 diabetes mellitus with diabetic chronic kidney disease: Secondary | ICD-10-CM | POA: Diagnosis not present

## 2016-08-27 DIAGNOSIS — I110 Hypertensive heart disease with heart failure: Secondary | ICD-10-CM | POA: Diagnosis not present

## 2016-08-27 DIAGNOSIS — M5136 Other intervertebral disc degeneration, lumbar region: Secondary | ICD-10-CM | POA: Diagnosis not present

## 2016-08-27 DIAGNOSIS — J431 Panlobular emphysema: Secondary | ICD-10-CM | POA: Diagnosis not present

## 2016-09-03 DIAGNOSIS — I10 Essential (primary) hypertension: Secondary | ICD-10-CM | POA: Diagnosis not present

## 2016-09-03 DIAGNOSIS — Z471 Aftercare following joint replacement surgery: Secondary | ICD-10-CM | POA: Diagnosis not present

## 2016-09-03 DIAGNOSIS — I5032 Chronic diastolic (congestive) heart failure: Secondary | ICD-10-CM | POA: Diagnosis not present

## 2016-09-03 DIAGNOSIS — M5136 Other intervertebral disc degeneration, lumbar region: Secondary | ICD-10-CM | POA: Diagnosis not present

## 2016-09-03 DIAGNOSIS — N184 Chronic kidney disease, stage 4 (severe): Secondary | ICD-10-CM | POA: Diagnosis not present

## 2016-09-03 DIAGNOSIS — Z79899 Other long term (current) drug therapy: Secondary | ICD-10-CM | POA: Diagnosis not present

## 2016-09-03 DIAGNOSIS — E1122 Type 2 diabetes mellitus with diabetic chronic kidney disease: Secondary | ICD-10-CM | POA: Diagnosis not present

## 2016-09-03 DIAGNOSIS — E119 Type 2 diabetes mellitus without complications: Secondary | ICD-10-CM | POA: Diagnosis not present

## 2016-09-03 DIAGNOSIS — J431 Panlobular emphysema: Secondary | ICD-10-CM | POA: Diagnosis not present

## 2016-09-03 DIAGNOSIS — I110 Hypertensive heart disease with heart failure: Secondary | ICD-10-CM | POA: Diagnosis not present

## 2016-09-03 DIAGNOSIS — Z794 Long term (current) use of insulin: Secondary | ICD-10-CM | POA: Diagnosis not present

## 2016-09-03 DIAGNOSIS — Z96641 Presence of right artificial hip joint: Secondary | ICD-10-CM | POA: Diagnosis not present

## 2016-09-04 DIAGNOSIS — I5032 Chronic diastolic (congestive) heart failure: Secondary | ICD-10-CM | POA: Diagnosis not present

## 2016-09-04 DIAGNOSIS — M5136 Other intervertebral disc degeneration, lumbar region: Secondary | ICD-10-CM | POA: Diagnosis not present

## 2016-09-04 DIAGNOSIS — Z471 Aftercare following joint replacement surgery: Secondary | ICD-10-CM | POA: Diagnosis not present

## 2016-09-04 DIAGNOSIS — E1122 Type 2 diabetes mellitus with diabetic chronic kidney disease: Secondary | ICD-10-CM | POA: Diagnosis not present

## 2016-09-04 DIAGNOSIS — I110 Hypertensive heart disease with heart failure: Secondary | ICD-10-CM | POA: Diagnosis not present

## 2016-09-04 DIAGNOSIS — J431 Panlobular emphysema: Secondary | ICD-10-CM | POA: Diagnosis not present

## 2016-09-08 DIAGNOSIS — I5032 Chronic diastolic (congestive) heart failure: Secondary | ICD-10-CM | POA: Diagnosis not present

## 2016-09-08 DIAGNOSIS — I110 Hypertensive heart disease with heart failure: Secondary | ICD-10-CM | POA: Diagnosis not present

## 2016-09-08 DIAGNOSIS — Z471 Aftercare following joint replacement surgery: Secondary | ICD-10-CM | POA: Diagnosis not present

## 2016-09-08 DIAGNOSIS — J431 Panlobular emphysema: Secondary | ICD-10-CM | POA: Diagnosis not present

## 2016-09-08 DIAGNOSIS — M5136 Other intervertebral disc degeneration, lumbar region: Secondary | ICD-10-CM | POA: Diagnosis not present

## 2016-09-08 DIAGNOSIS — E1122 Type 2 diabetes mellitus with diabetic chronic kidney disease: Secondary | ICD-10-CM | POA: Diagnosis not present

## 2016-09-10 DIAGNOSIS — I110 Hypertensive heart disease with heart failure: Secondary | ICD-10-CM | POA: Diagnosis not present

## 2016-09-10 DIAGNOSIS — Z471 Aftercare following joint replacement surgery: Secondary | ICD-10-CM | POA: Diagnosis not present

## 2016-09-10 DIAGNOSIS — J431 Panlobular emphysema: Secondary | ICD-10-CM | POA: Diagnosis not present

## 2016-09-10 DIAGNOSIS — M5136 Other intervertebral disc degeneration, lumbar region: Secondary | ICD-10-CM | POA: Diagnosis not present

## 2016-09-10 DIAGNOSIS — I5032 Chronic diastolic (congestive) heart failure: Secondary | ICD-10-CM | POA: Diagnosis not present

## 2016-09-10 DIAGNOSIS — E1122 Type 2 diabetes mellitus with diabetic chronic kidney disease: Secondary | ICD-10-CM | POA: Diagnosis not present

## 2016-09-16 DIAGNOSIS — G4733 Obstructive sleep apnea (adult) (pediatric): Secondary | ICD-10-CM | POA: Diagnosis not present

## 2016-09-16 DIAGNOSIS — Z471 Aftercare following joint replacement surgery: Secondary | ICD-10-CM | POA: Diagnosis not present

## 2016-09-16 DIAGNOSIS — Z96641 Presence of right artificial hip joint: Secondary | ICD-10-CM | POA: Diagnosis not present

## 2016-09-16 DIAGNOSIS — M6281 Muscle weakness (generalized): Secondary | ICD-10-CM | POA: Diagnosis not present

## 2016-09-16 DIAGNOSIS — R262 Difficulty in walking, not elsewhere classified: Secondary | ICD-10-CM | POA: Diagnosis not present

## 2016-09-16 DIAGNOSIS — M1A00X0 Idiopathic chronic gout, unspecified site, without tophus (tophi): Secondary | ICD-10-CM | POA: Diagnosis not present

## 2016-09-24 DIAGNOSIS — M25561 Pain in right knee: Secondary | ICD-10-CM | POA: Diagnosis not present

## 2016-09-24 DIAGNOSIS — G8929 Other chronic pain: Secondary | ICD-10-CM | POA: Diagnosis not present

## 2016-09-24 DIAGNOSIS — I10 Essential (primary) hypertension: Secondary | ICD-10-CM | POA: Diagnosis not present

## 2016-09-24 DIAGNOSIS — M25562 Pain in left knee: Secondary | ICD-10-CM | POA: Diagnosis not present

## 2016-10-02 DIAGNOSIS — M170 Bilateral primary osteoarthritis of knee: Secondary | ICD-10-CM | POA: Diagnosis not present

## 2016-10-02 DIAGNOSIS — Z23 Encounter for immunization: Secondary | ICD-10-CM | POA: Diagnosis not present

## 2016-10-02 DIAGNOSIS — M25562 Pain in left knee: Secondary | ICD-10-CM | POA: Diagnosis not present

## 2016-10-02 DIAGNOSIS — M25561 Pain in right knee: Secondary | ICD-10-CM | POA: Diagnosis not present

## 2016-10-09 DIAGNOSIS — I10 Essential (primary) hypertension: Secondary | ICD-10-CM | POA: Diagnosis not present

## 2016-10-21 DIAGNOSIS — Z96641 Presence of right artificial hip joint: Secondary | ICD-10-CM | POA: Diagnosis not present

## 2016-10-21 DIAGNOSIS — M170 Bilateral primary osteoarthritis of knee: Secondary | ICD-10-CM | POA: Diagnosis not present

## 2016-11-17 ENCOUNTER — Ambulatory Visit: Payer: Medicare HMO | Attending: Student

## 2016-11-17 DIAGNOSIS — R2689 Other abnormalities of gait and mobility: Secondary | ICD-10-CM

## 2016-11-17 DIAGNOSIS — M25651 Stiffness of right hip, not elsewhere classified: Secondary | ICD-10-CM | POA: Diagnosis not present

## 2016-11-17 DIAGNOSIS — M6281 Muscle weakness (generalized): Secondary | ICD-10-CM | POA: Diagnosis not present

## 2016-11-17 DIAGNOSIS — M25661 Stiffness of right knee, not elsewhere classified: Secondary | ICD-10-CM | POA: Insufficient documentation

## 2016-11-17 NOTE — Therapy (Signed)
[place] MAIN [facility] SERVICES [address] [place], [place], [address] Phone: [phone]   Fax:  [phone]  Physical Therapy Evaluation  Patient Details  Name: Mackenzie Hill MRN: [id number] Date of Birth: 05-27-1940 Referring Provider: Damaris Hippo, PA   Encounter Date: 11/17/2016  PT End of Session - 11/17/16 1410    Visit Number  1    Number of Visits  16    Date for PT Re-Evaluation  01/12/17    Authorization - Visit Number  1    Authorization - Number of Visits  10    PT Start Time  0800    PT Stop Time  2426    PT Time Calculation (min)  58 min    Equipment Utilized During Treatment  Gait belt    Activity Tolerance  Patient tolerated treatment well;Patient limited by pain;Patient limited by fatigue    Behavior During Therapy  [facility] for tasks assessed/performed       Past Medical History:  Diagnosis Date  . Anemia   . Anemia, unspecified    Hgb 11.8 - 04/2013  . Arthritis   . Cataract    Bilateral  . CHF (congestive heart failure) ([place])   . Chronic kidney disease   . COPD (chronic obstructive pulmonary disease) ([place])   . DDD (degenerative disc disease), lumbar   . Degenerative arthritis   . Diabetes mellitus type 2, uncomplicated (HCC)    AODM (A1C 6.9%) 04/2013  . Diabetes mellitus without complication ([place])   . Dyspnea    with exertion  . Esophagitis   . Gout    Uric acid 5.4 - 08/2012  . Hypertension   . Obesity, unspecified   . Sarcoidosis, lung ([place])    reported by pt, Clinically without a biopsy  . Sleep apnea    OSA--Use C-PAP    Past Surgical History:  Procedure Laterality Date  . ABDOMINAL HYSTERECTOMY     age 70  . COLONOSCOPY  11/02/2005   Hyperplastic Polyp: CBF 10/2015; Recall Ltr mailed 08/27/2015 (dw)  . FOOT SURGERY Right   . HALLUX VALGUS REPAIR Left   . OOPHORECTOMY      There were no vitals filed for this visit.           Subjective Assessment - 11/17/16 0810    Subjective   Patient is a pleasant 76 year old female who presents with OA of knees. R Knee is primary antagonist and has been limited since her R hip surgery in July.     Pertinent History  Patient presents to physical therapy evaluation with SPC. Patient had R hip surgery on July 10th and ever since the surgery she has had difficulty bending her R knee. R knee is difficult to move, can't sit up and down without extreme difficulty. Had a cortisone shot in knees in August. 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     Limitations  Sitting;Standing;Walking;House hold activities;Other (comment)    How long can you sit comfortably?  20 minutes    How long can you stand comfortably?  20 minutes    How long can you walk comfortably?  20 minutes    Patient Stated Goals  sit down and get up easier.     Currently in Pain?  No/denies      PAIN: Current pain: 0/10 Worst pain: 5/10  POSTURE: Limited weight shift over RLE  PROM/AROM:  R knee extension in supine: AROM: -10  Flexion in supine: AROM: 92 Limited R hamstring mobility   Knee end feel : hard bilaterally, painful to patient   STRENGTH:  Graded on a 0-5 scale Muscle Group Left Right  Shoulder flex    Shoulder Abd    Shoulder Ext    Shoulder IR/ER    Elbow    Wrist/hand    Hip Flex 4/5 2/5  Hip Abd 4/5 2+/5  Hip Add 4/5 2/5  Hip Ext 4/5 2+/5  Hip IR/ER    Knee Flex 4/5 3/5  Knee Ext 4/5 3/5  Ankle DF 4/5 4-/5  Ankle PF 4/5 4-/5   SENSATION: WFL between legs   SPECIAL TESTS: Unable to perform SLR  FUNCTIONAL MOBILITY: Mod I  For bed mobility with increased time and difficulty moving RLE.  Require UE assistance for STS  BALANCE: Require Cane for stability in standing   OUTCOME MEASURES: TEST Outcome Interpretation  5 times sit<>stand 48 sec >60 yo, >15 sec indicates increased risk for falls  10 meter walk test     .34            m/s <1.0 m/s indicates increased risk for falls; limited community  ambulator  Timed up and Go  41               sec <14 sec indicates increased risk for falls  LEFS 48/80            Treat: LAQ  Seated marches Seated abduction Seated adduction Ankle pumps Sit to stands   [facility] PT Assessment - 11/17/16 0001      Assessment   Medical Diagnosis  bilateral knee OA    Referring Provider  Damaris Hippo, PA    Onset Date/Surgical Date  07/21/16    Hand Dominance  Right    Next MD Visit  pt. not sure    Prior Therapy  yes Home health for hip       Precautions   Precautions  None      Restrictions   Weight Bearing Restrictions  No      Balance Screen   Has the patient fallen in the past 6 months  No    Has the patient had a decrease in activity level because of a fear of falling?   Yes    Is the patient reluctant to leave their home because of a fear of falling?   Yes      [place]  Private residence    Living Arrangements  Children    Available Help at Discharge  Family    Type of [place] to enter    Entrance Stairs-Number of Steps  3    Entrance Stairs-Rails  Right    [place]  One level    [place] - single point;Walker - standard;Grab bars - tub/shower;Shower seat;Bedside commode      Prior Function   Level of Independence  Independent    Leisure  silver sneakers       Cognition   Overall Cognitive Status  Within Functional Limits for tasks assessed      Sensation   Light Touch  Appears Intact      Coordination   Gross Motor Movements are Fluid and Coordinated  No      Posture/Postural Control   Posture/Postural Control  Postural limitations    Postural Limitations  Flexed trunk;Weight shift left  Bed Mobility   Bed Mobility  Left Sidelying to Sit;Sit to Sidelying Left    Left Sidelying to Sit  6: Modified independent (Device/Increase time)    Sit to Sidelying Left  6: Modified independent (Device/Increase time)      Transfers   Transfers  Sit to  Stand;Stand to Sit;Stand Pivot Transfers    Sit to Stand  6: Modified independent (Device/Increase time);4: Min guard    Five time sit to stand comments   require UE assistance    Stand to Sit  6: Modified independent (Device/Increase time)    Stand Pivot Transfers  6: Modified independent (Device/Increase time)      Ambulation/Gait   Ambulation/Gait  Yes    Ambulation/Gait Assistance  6: Modified independent (Device/Increase time);5: Supervision    Ambulation Distance (Feet)  15 Feet    Assistive device  Straight cane    Gait Pattern  Lateral trunk lean to left;Trunk flexed;Poor foot clearance - right;Antalgic;Decreased hip/knee flexion - right;Decreased stride length    Ambulation Surface  Level;Indoor             Objective measurements completed on examination: See above findings.              PT Education - 11/17/16 1409    Education provided  Yes    Education Details  HEP, ambulatory mechanics    Person(s) Educated  Patient    Methods  Explanation;Demonstration;Handout    Comprehension  Verbalized understanding;Returned demonstration       PT Short Term Goals - 11/17/16 1423      PT SHORT TERM GOAL #1   Title  Patient will perform three five consecutive sit to stands in a row without a break demonstrating increased functional strength.     Baseline  requires rest breaks between sit to stands and excessive UE assist     Time  2    Period  Weeks    Status  New    Target Date  12/01/16      PT SHORT TERM GOAL #2   Title  Patient will be independent in home exercise program to improve strength/mobility for better functional independence with ADLs.    Baseline  hep given    Time  2    Period  Weeks    Status  New    Target Date  12/01/16      PT SHORT TERM GOAL #3   Title  Patient will be modified independent in walking on even/uneven surface with least restrictive assistive device, for 10+ minutes without rest break, reporting some difficulty or less to  improve walking tolerance with community ambulation including grocery shopping, going to [place]  require frequent rest breaks for ambulation    Time  2    Period  Weeks    Status  New    Target Date  12/01/16        PT Long Term Goals - 11/17/16 1426      PT LONG TERM GOAL #1   Title  Patient (> 8 years old) will complete five times sit to stand test in < 15 seconds indicating an increased LE strength and improved balance.    Baseline  11/6: 48 seconds    Time  8    Period  Weeks    Status  New    Target Date  01/12/17      PT LONG TERM GOAL #2   Title  Patient will increase 10  meter walk test to >1.21m/s as to improve gait speed for better community ambulation and to reduce fall risk.    Baseline  2022-12-05: .61m/s    Time  8    Period  Weeks    Status  New    Target Date  01/12/17      PT LONG TERM GOAL #3   Title  Patient will reduce timed up and go to <11 seconds to reduce fall risk and demonstrate improved transfer/gait ability.    Baseline  12/05/22: 41 seconds    Time  8    Period  Weeks    Status  New    Target Date  01/12/17      PT LONG TERM GOAL #4   Title  Patient will increase lower extremity functional scale to >60/80 to demonstrate improved functional mobility and increased tolerance with ADLs    Baseline  2022-12-05: 48/80    Time  8    Period  Weeks    Status  New    Target Date  01/12/17             Plan - 2016-12-04 1412    Clinical Impression Statement  Patient is a pleasant 76 year old female who presents with bilateral knee OA with R>L. When performing sit to stand patient puts weight over LLE, minimizing weight distribution to RLE.  R quadriceps musculature most limited in strength, limited clearance of LE with ambulation. Sit to stand transfer requires UE assistance and addiitonal time. 5x STS= 48 seconds, 10MWT=.8m/s, TUG=41 seconds, LEFS 48/80. Patient would benefit from skilled physical therapy to increase mobility and strength of LE's  for return to previous level of function.     History and Personal Factors relevant to plan of care:  This patient presents with 3, personal factors/ comorbidities , and 1-2,  body elements including body structures and functions, activity limitations and or participation restrictions. Patient's condition is evolving.     Clinical Presentation  Evolving    Clinical Presentation due to:  consistent decline in mobility and funciton.     Clinical Decision Making  Moderate    Rehab Potential  Fair    Clinical Impairments Affecting Rehab Potential  (-) R hip sx complications, consistant decline in function and mobility; (+) desire for progress,     PT Frequency  2x / week    PT Duration  8 weeks    PT Treatment/Interventions  ADLs/Self Care Home Management;Aquatic Therapy;Cryotherapy;Electrical Stimulation;Ultrasound;Traction;Moist Heat;Iontophoresis 4mg /ml Dexamethasone;DME Instruction;Gait training;Stair training;Functional mobility training;Therapeutic activities;Therapeutic exercise;Patient/family education;Neuromuscular re-education;Balance training;Manual techniques;Taping;Energy conservation;Passive range of motion    PT Next Visit Plan  review HEP, strengthen quad, stretch hamstring    PT Home Exercise Plan  see sheet    Consulted and Agree with Plan of Care  Patient       Patient will benefit from skilled therapeutic intervention in order to improve the following deficits and impairments:  Abnormal gait, Decreased knowledge of use of DME, Decreased coordination, Decreased activity tolerance, Decreased balance, Decreased endurance, Decreased range of motion, Decreased safety awareness, Decreased mobility, Decreased strength, Difficulty walking, Impaired flexibility, Impaired perceived functional ability, Increased muscle spasms, Hypomobility, Postural dysfunction, Improper body mechanics, Pain  Visit Diagnosis: Muscle weakness (generalized)  Stiffness of right knee, not elsewhere  classified  Other abnormalities of gait and mobility  G-Codes - 12/04/2016 1429    Functional Assessment Tool Used (Outpatient Only)  5xSTS, 10MWT, TUG, LEFS, clinical judgement, ROM    Functional Limitation  Mobility:  Walking and moving around    Mobility: Walking and Moving Around Current Status [phone])  At least 60 percent but less than 80 percent impaired, limited or restricted    Mobility: Walking and Moving Around Goal Status [phone])  At least 20 percent but less than 40 percent impaired, limited or restricted        Problem List Patient Active Problem List   Diagnosis Date Noted  . COPD (chronic obstructive pulmonary disease) ([place]) 08/11/2016  . Primary localized osteoarthritis of right hip 07/21/2016  Janna Arch, PT, DPT    Janna Arch 11/17/2016, 2:30 PM  [place] MAIN [facility] SERVICES [address] [place], [place], [address] Phone: [phone]   Fax:  [phone]  Name: Mackenzie Hill MRN: [id number] Date of Birth: Nov 08, 1940

## 2016-11-19 ENCOUNTER — Ambulatory Visit: Payer: Medicare HMO

## 2016-11-19 DIAGNOSIS — R2689 Other abnormalities of gait and mobility: Secondary | ICD-10-CM

## 2016-11-19 DIAGNOSIS — M25651 Stiffness of right hip, not elsewhere classified: Secondary | ICD-10-CM | POA: Diagnosis not present

## 2016-11-19 DIAGNOSIS — M6281 Muscle weakness (generalized): Secondary | ICD-10-CM | POA: Diagnosis not present

## 2016-11-19 DIAGNOSIS — M25661 Stiffness of right knee, not elsewhere classified: Secondary | ICD-10-CM

## 2016-11-19 NOTE — Therapy (Signed)
[place] MAIN [facility] SERVICES [address] [place], [place], [address] Phone: [phone]   Fax:  [phone]  Physical Therapy Treatment  Patient Details  Name: Mackenzie Hill MRN: [id number] Date of Birth: 08-27-1940 Referring Provider: Damaris Hippo, PA   Encounter Date: 11/19/2016  PT End of Session - 11/19/16 0856    Visit Number  2    Number of Visits  16    Date for PT Re-Evaluation  01/12/17    Authorization - Visit Number  2    Authorization - Number of Visits  10    PT Start Time  0845    PT Stop Time  0930    PT Time Calculation (min)  45 min    Equipment Utilized During Treatment  Gait belt    Activity Tolerance  Patient tolerated treatment well;Patient limited by pain;Patient limited by fatigue    Behavior During Therapy  [facility] for tasks assessed/performed       Past Medical History:  Diagnosis Date  . Anemia   . Anemia, unspecified    Hgb 11.8 - 04/2013  . Arthritis   . Cataract    Bilateral  . CHF (congestive heart failure) ([place])   . Chronic kidney disease   . COPD (chronic obstructive pulmonary disease) ([place])   . DDD (degenerative disc disease), lumbar   . Degenerative arthritis   . Diabetes mellitus type 2, uncomplicated (HCC)    AODM (A1C 6.9%) 04/2013  . Diabetes mellitus without complication ([place])   . Dyspnea    with exertion  . Esophagitis   . Gout    Uric acid 5.4 - 08/2012  . Hypertension   . Obesity, unspecified   . Sarcoidosis, lung ([place])    reported by pt, Clinically without a biopsy  . Sleep apnea    OSA--Use C-PAP    Past Surgical History:  Procedure Laterality Date  . ABDOMINAL HYSTERECTOMY     age 48  . COLONOSCOPY  11/02/2005   Hyperplastic Polyp: CBF 10/2015; Recall Ltr mailed 08/27/2015 (dw)  . FOOT SURGERY Right   . HALLUX VALGUS REPAIR Left   . OOPHORECTOMY      There were no vitals filed for this visit.  Subjective Assessment - 11/19/16 0850    Subjective  Patient was sore after  intervention. Had difficulty with up and down exercise but could do seated exercises.     Pertinent History  Patient presents to physical therapy evaluation with SPC. Patient had R hip surgery on July 10th and ever since the surgery she has had difficulty bending her R knee. R knee is difficult to move, can't sit up and down without extreme difficulty. Had a cortisone shot in knees in August. 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     Limitations  Sitting;Standing;Walking;House hold activities;Other (comment)    How long can you sit comfortably?  20 minutes    How long can you stand comfortably?  20 minutes    How long can you walk comfortably?  20 minutes    Patient Stated Goals  sit down and get up easier.     Currently in Pain?  Yes    Pain Score  2     Pain Location  Knee    Pain Orientation  Right    Pain Descriptors / Indicators  Aching;Tightness    Pain Type  Chronic pain         Treat: Quad set 10x  3 second with towel under knee glute squeezes 15x supine  SAQ 12x L and R LAQ 15x L and R, cues for controlling eccentric motion Resisted knee flexion orange theraband 12x L and R  Seated marches 20x  Seated abduction 15x orange theraband  Seated adduction 15x with mirror in front to correct for left weight shift.  Ankle pumps supine 20x  Stair stretch hamstring 60 seconds each 2x 60 seconds Seated hamstring stretch 3x30 seconds    Hamstring stretch 60 seconds Dorsiflexion overpressure 60 seconds    Pt. response to medical necessity:  Patient will continue to benefit from skilled physical therapy services to increase mobility and strength of LE's for return to PLOF.               PT Education - 11/19/16 0856    Education provided  Yes    Education Details  icing after exercise, HEP compliance: sit to stand from stable surface    Person(s) Educated  Patient    Methods  Demonstration;Explanation;Verbal cues    Comprehension   Verbalized understanding;Returned demonstration       PT Short Term Goals - 11/17/16 1423      PT SHORT TERM GOAL #1   Title  Patient will perform three five consecutive sit to stands in a row without a break demonstrating increased functional strength.     Baseline  requires rest breaks between sit to stands and excessive UE assist     Time  2    Period  Weeks    Status  New    Target Date  12/01/16      PT SHORT TERM GOAL #2   Title  Patient will be independent in home exercise program to improve strength/mobility for better functional independence with ADLs.    Baseline  hep given    Time  2    Period  Weeks    Status  New    Target Date  12/01/16      PT SHORT TERM GOAL #3   Title  Patient will be modified independent in walking on even/uneven surface with least restrictive assistive device, for 10+ minutes without rest break, reporting some difficulty or less to improve walking tolerance with community ambulation including grocery shopping, going to [place]  require frequent rest breaks for ambulation    Time  2    Period  Weeks    Status  New    Target Date  12/01/16        PT Long Term Goals - 11/17/16 1426      PT LONG TERM GOAL #1   Title  Patient (> 61 years old) will complete five times sit to stand test in < 15 seconds indicating an increased LE strength and improved balance.    Baseline  11/6: 48 seconds    Time  8    Period  Weeks    Status  New    Target Date  01/12/17      PT LONG TERM GOAL #2   Title  Patient will increase 10 meter walk test to >1.72m/s as to improve gait speed for better community ambulation and to reduce fall risk.    Baseline  11/6: .40m/s    Time  8    Period  Weeks    Status  New    Target Date  01/12/17      PT LONG TERM GOAL #3   Title  Patient will reduce timed  up and go to <11 seconds to reduce fall risk and demonstrate improved transfer/gait ability.    Baseline  11/6: 41 seconds    Time  8    Period   Weeks    Status  New    Target Date  01/12/17      PT LONG TERM GOAL #4   Title  Patient will increase lower extremity functional scale to >60/80 to demonstrate improved functional mobility and increased tolerance with ADLs    Baseline  11/6: 48/80    Time  8    Period  Weeks    Status  New    Target Date  01/12/17            Plan - 11/19/16 0926    Clinical Impression Statement  Pt. demonstrates frequent left weight shift to unload R hip, not due to pain, corrected in mirror with no pain. Bilateral hamstrings excessively tight leading to abnormal gait pattern. Seated hamstring stretch added to HEP. Patient will continue to benefit from skilled physical therapy services to increase mobility and strength of LE's for return to PLOF.     Rehab Potential  Fair    Clinical Impairments Affecting Rehab Potential  (-) R hip sx complications, consistant decline in function and mobility; (+) desire for progress,     PT Frequency  2x / week    PT Duration  8 weeks    PT Treatment/Interventions  ADLs/Self Care Home Management;Aquatic Therapy;Cryotherapy;Electrical Stimulation;Ultrasound;Traction;Moist Heat;Iontophoresis 4mg /ml Dexamethasone;DME Instruction;Gait training;Stair training;Functional mobility training;Therapeutic activities;Therapeutic exercise;Patient/family education;Neuromuscular re-education;Balance training;Manual techniques;Taping;Energy conservation;Passive range of motion    PT Next Visit Plan  review HEP, strengthen quad, stretch hamstring    PT Home Exercise Plan  see sheet    Consulted and Agree with Plan of Care  Patient       Patient will benefit from skilled therapeutic intervention in order to improve the following deficits and impairments:  Abnormal gait, Decreased knowledge of use of DME, Decreased coordination, Decreased activity tolerance, Decreased balance, Decreased endurance, Decreased range of motion, Decreased safety awareness, Decreased mobility, Decreased  strength, Difficulty walking, Impaired flexibility, Impaired perceived functional ability, Increased muscle spasms, Hypomobility, Postural dysfunction, Improper body mechanics, Pain  Visit Diagnosis: Muscle weakness (generalized)  Stiffness of right knee, not elsewhere classified  Other abnormalities of gait and mobility     Problem List Patient Active Problem List   Diagnosis Date Noted  . COPD (chronic obstructive pulmonary disease) ([place]) 08/11/2016  . Primary localized osteoarthritis of right hip 07/21/2016   Janna Arch, PT, DPT   Janna Arch 11/19/2016, 9:32 AM  [place] MAIN [facility] SERVICES [address] [place], [place], [address] Phone: [phone]   Fax:  [phone]  Name: Mackenzie Hill MRN: [id number] Date of Birth: [birth year]

## 2016-11-24 ENCOUNTER — Ambulatory Visit: Payer: Medicare HMO

## 2016-11-24 DIAGNOSIS — R2689 Other abnormalities of gait and mobility: Secondary | ICD-10-CM | POA: Diagnosis not present

## 2016-11-24 DIAGNOSIS — M25651 Stiffness of right hip, not elsewhere classified: Secondary | ICD-10-CM | POA: Diagnosis not present

## 2016-11-24 DIAGNOSIS — M25661 Stiffness of right knee, not elsewhere classified: Secondary | ICD-10-CM

## 2016-11-24 DIAGNOSIS — M6281 Muscle weakness (generalized): Secondary | ICD-10-CM

## 2016-11-24 NOTE — Therapy (Signed)
[place] MAIN [facility] SERVICES [address] [place], [place], [address] Phone: [phone]   Fax:  [phone]  Physical Therapy Treatment  Patient Details  Name: Mackenzie Hill MRN: [id number] Date of Birth: 08/07/1940 Referring Provider: Damaris Hippo, PA   Encounter Date: 11/24/2016  PT End of Session - 11/24/16 0812    Visit Number  3    Number of Visits  16    Date for PT Re-Evaluation  01/12/17    Authorization - Visit Number  3    Authorization - Number of Visits  10    PT Start Time  0800    PT Stop Time  0845    PT Time Calculation (min)  45 min    Equipment Utilized During Treatment  Gait belt    Activity Tolerance  Patient tolerated treatment well;Patient limited by pain;Patient limited by fatigue    Behavior During Therapy  [facility] for tasks assessed/performed       Past Medical History:  Diagnosis Date  . Anemia   . Anemia, unspecified    Hgb 11.8 - 04/2013  . Arthritis   . Cataract    Bilateral  . CHF (congestive heart failure) ([place])   . Chronic kidney disease   . COPD (chronic obstructive pulmonary disease) ([place])   . DDD (degenerative disc disease), lumbar   . Degenerative arthritis   . Diabetes mellitus type 2, uncomplicated (HCC)    AODM (A1C 6.9%) 04/2013  . Diabetes mellitus without complication ([place])   . Dyspnea    with exertion  . Esophagitis   . Gout    Uric acid 5.4 - 08/2012  . Hypertension   . Obesity, unspecified   . Sarcoidosis, lung ([place])    reported by pt, Clinically without a biopsy  . Sleep apnea    OSA--Use C-PAP    Past Surgical History:  Procedure Laterality Date  . ABDOMINAL HYSTERECTOMY     age 48  . COLONOSCOPY  11/02/2005   Hyperplastic Polyp: CBF 10/2015; Recall Ltr mailed 08/27/2015 (dw)  . FOOT SURGERY Right   . HALLUX VALGUS REPAIR Left   . OOPHORECTOMY      There were no vitals filed for this visit.  Subjective Assessment - 11/24/16 0803    Subjective  Patient feeling stiff  over the weekend. Reports compliance with HEP , difficulty with sit to stands.     Pertinent History  Patient presents to physical therapy evaluation with SPC. Patient had R hip surgery on July 10th and ever since the surgery she has had difficulty bending her R knee. R knee is difficult to move, can't sit up and down without extreme difficulty. Had a cortisone shot in knees in August. 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     Limitations  Sitting;Standing;Walking;House hold activities;Other (comment)    How long can you sit comfortably?  20 minutes    How long can you stand comfortably?  20 minutes    How long can you walk comfortably?  20 minutes    Patient Stated Goals  sit down and get up easier.     Currently in Pain?  No/denies       Treat: Quad set 10x 3 second with towel under knee glute squeezes 15x supine  SAQ 15x L and R hooklying LAQ 15x L and R, cues for controlling eccentric motion Resisted knee flexion orange theraband 15x L and R  Seated marches 20x  Hooklying  abduction 15x orange theraband  Hooklying adduction 15x with green ball Bridges 10x with cueing for gluteal squeezes  Seated hamstring stretch 3x30 seconds 6 " step toe taps 20x, BUE support 6" step side toe tap 15x each side BUE support   step over and back over half foam roller 15x each side BUE support  Hamstring stretch 60 seconds Dorsiflexion overpressure 60 seconds Supine hip flexor stretch 60 seconds Side stepping in // bars 4x    Pt. response to medical necessity:  Patient will continue to benefit from skilled physical therapy services to increase mobility and strength of LE's for return to PLOF.                       PT Education - 11/24/16 [phone]    Education provided  Yes    Education Details  quad strengthening and hamstring stretch    Person(s) Educated  Patient    Methods  Explanation;Demonstration;Verbal cues    Comprehension  Verbalized  understanding;Returned demonstration       PT Short Term Goals - 11/17/16 1423      PT SHORT TERM GOAL #1   Title  Patient will perform three five consecutive sit to stands in a row without a break demonstrating increased functional strength.     Baseline  requires rest breaks between sit to stands and excessive UE assist     Time  2    Period  Weeks    Status  New    Target Date  12/01/16      PT SHORT TERM GOAL #2   Title  Patient will be independent in home exercise program to improve strength/mobility for better functional independence with ADLs.    Baseline  hep given    Time  2    Period  Weeks    Status  New    Target Date  12/01/16      PT SHORT TERM GOAL #3   Title  Patient will be modified independent in walking on even/uneven surface with least restrictive assistive device, for 10+ minutes without rest break, reporting some difficulty or less to improve walking tolerance with community ambulation including grocery shopping, going to [place]  require frequent rest breaks for ambulation    Time  2    Period  Weeks    Status  New    Target Date  12/01/16        PT Long Term Goals - 11/17/16 1426      PT LONG TERM GOAL #1   Title  Patient (> 30 years old) will complete five times sit to stand test in < 15 seconds indicating an increased LE strength and improved balance.    Baseline  11/6: 48 seconds    Time  8    Period  Weeks    Status  New    Target Date  01/12/17      PT LONG TERM GOAL #2   Title  Patient will increase 10 meter walk test to >1.81m/s as to improve gait speed for better community ambulation and to reduce fall risk.    Baseline  11/6: .4m/s    Time  8    Period  Weeks    Status  New    Target Date  01/12/17      PT LONG TERM GOAL #3   Title  Patient will reduce timed up and go to <11 seconds to reduce fall risk  and demonstrate improved transfer/gait ability.    Baseline  11/6: 41 seconds    Time  8    Period  Weeks     Status  New    Target Date  01/12/17      PT LONG TERM GOAL #4   Title  Patient will increase lower extremity functional scale to >60/80 to demonstrate improved functional mobility and increased tolerance with ADLs    Baseline  11/6: 48/80    Time  8    Period  Weeks    Status  New    Target Date  01/12/17            Plan - 11/24/16 0846    Clinical Impression Statement  Pt.  performed hooklying strengthening activities with minimal compensation of R hip and knee. R continues to be weaker than L and needs more frequent tactile and verbal cueing for full ROM and  sequencing of task. Standing interventions implemented with good body mechanics.  Patient will continue to benefit from skilled physical therapy services to increase mobility and strength of LE's for return to PLOF.    Rehab Potential  Fair    Clinical Impairments Affecting Rehab Potential  (-) R hip sx complications, consistant decline in function and mobility; (+) desire for progress,     PT Frequency  2x / week    PT Duration  8 weeks    PT Treatment/Interventions  ADLs/Self Care Home Management;Aquatic Therapy;Cryotherapy;Electrical Stimulation;Ultrasound;Traction;Moist Heat;Iontophoresis 4mg /ml Dexamethasone;DME Instruction;Gait training;Stair training;Functional mobility training;Therapeutic activities;Therapeutic exercise;Patient/family education;Neuromuscular re-education;Balance training;Manual techniques;Taping;Energy conservation;Passive range of motion    PT Next Visit Plan  review HEP, strengthen quad, stretch hamstring    PT Home Exercise Plan  see sheet    Consulted and Agree with Plan of Care  Patient       Patient will benefit from skilled therapeutic intervention in order to improve the following deficits and impairments:  Abnormal gait, Decreased knowledge of use of DME, Decreased coordination, Decreased activity tolerance, Decreased balance, Decreased endurance, Decreased range of motion, Decreased safety  awareness, Decreased mobility, Decreased strength, Difficulty walking, Impaired flexibility, Impaired perceived functional ability, Increased muscle spasms, Hypomobility, Postural dysfunction, Improper body mechanics, Pain  Visit Diagnosis: Muscle weakness (generalized)  Stiffness of right knee, not elsewhere classified  Other abnormalities of gait and mobility     Problem List Patient Active Problem List   Diagnosis Date Noted  . COPD (chronic obstructive pulmonary disease) ([place]) 08/11/2016  . Primary localized osteoarthritis of right hip 07/21/2016   Janna Arch, PT, DPT   Janna Arch 11/24/2016, 8:47 AM  [place] MAIN [facility] SERVICES [address] [place], [place], [address] Phone: [phone]   Fax:  [phone]  Name: Mackenzie Hill MRN: [id number] Date of Birth: [birth year]

## 2016-11-26 ENCOUNTER — Ambulatory Visit: Payer: Medicare HMO

## 2016-11-26 DIAGNOSIS — M25661 Stiffness of right knee, not elsewhere classified: Secondary | ICD-10-CM | POA: Diagnosis not present

## 2016-11-26 DIAGNOSIS — R2689 Other abnormalities of gait and mobility: Secondary | ICD-10-CM | POA: Diagnosis not present

## 2016-11-26 DIAGNOSIS — M6281 Muscle weakness (generalized): Secondary | ICD-10-CM

## 2016-11-26 DIAGNOSIS — M25651 Stiffness of right hip, not elsewhere classified: Secondary | ICD-10-CM | POA: Diagnosis not present

## 2016-11-26 NOTE — Therapy (Signed)
[place] MAIN [facility] SERVICES [address] [place], [place], [address] Phone: [phone]   Fax:  [phone]  Physical Therapy Treatment  Patient Details  Name: Mackenzie Hill MRN: [id number] Date of Birth: 11-25-1940 Referring Provider: Damaris Hippo, PA   Encounter Date: 11/26/2016  PT End of Session - 11/26/16 0901    Visit Number  4    Number of Visits  16    Date for PT Re-Evaluation  01/12/17    Authorization - Visit Number  4    Authorization - Number of Visits  10    PT Start Time  0845    PT Stop Time  0930    PT Time Calculation (min)  45 min    Equipment Utilized During Treatment  Gait belt    Activity Tolerance  Patient tolerated treatment well;Patient limited by pain;Patient limited by fatigue    Behavior During Therapy  [facility] for tasks assessed/performed       Past Medical History:  Diagnosis Date  . Anemia   . Anemia, unspecified    Hgb 11.8 - 04/2013  . Arthritis   . Cataract    Bilateral  . CHF (congestive heart failure) ([place])   . Chronic kidney disease   . COPD (chronic obstructive pulmonary disease) ([place])   . DDD (degenerative disc disease), lumbar   . Degenerative arthritis   . Diabetes mellitus type 2, uncomplicated (HCC)    AODM (A1C 6.9%) 04/2013  . Diabetes mellitus without complication ([place])   . Dyspnea    with exertion  . Esophagitis   . Gout    Uric acid 5.4 - 08/2012  . Hypertension   . Obesity, unspecified   . Sarcoidosis, lung ([place])    reported by pt, Clinically without a biopsy  . Sleep apnea    OSA--Use C-PAP    Past Surgical History:  Procedure Laterality Date  . ABDOMINAL HYSTERECTOMY     age 29  . COLONOSCOPY  11/02/2005   Hyperplastic Polyp: CBF 10/2015; Recall Ltr mailed 08/27/2015 (dw)  . FOOT SURGERY Right   . HALLUX VALGUS REPAIR Left   . OOPHORECTOMY    . 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  Subjective Assessment - 11/26/16 0850    Subjective  Patient reports increased stiffness, no pain in hip and knee of left side. Reports compliance of HEP.     Pertinent History  Patient presents to physical therapy evaluation with SPC. Patient had R hip surgery on July 10th and ever since the surgery she has had difficulty bending her R knee. R knee is difficult to move, can't sit up and down without extreme difficulty. Had a cortisone shot in knees in August. 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     Limitations  Sitting;Standing;Walking;House hold activities;Other (comment)    How long can you sit comfortably?  20 minutes    How long can you stand comfortably?  20 minutes    How long can you walk comfortably?  20 minutes    Patient Stated Goals  sit down and get up easier.     Currently in Pain?  No/denies       Treat: Quad set 10x 3 second with towel under knee glute squeezes 15x supine  SAQ 2x 15x L and R hooklying LAQ 2x 15x L and R, cues for controlling eccentric motion Resisted knee flexion orange theraband 2x 15x L and R  Seated marches 2x 20x with OTB  Hooklying abduction 15x orange theraband  Hooklying adduction 15x with green ball Bridges 10x with cueing for gluteal squeezes  Walking high knee marches in // bars 4x with CGA and BUE support  Seated hamstring stretch 3x30 seconds Hip flexion in // bars 10x each leg   step over and back over half foam roller 15x each side BUE support  Step over and back side step over half foam roller 15x each side BUE support, cues for upright posture  Hamstring stretch 60 seconds Dorsiflexion overpressure 60 seconds Supine hip flexor stretch 60 seconds Supine IT band stretch 30 seconds                       PT Education - 11/26/16 0901    Education provided  Yes     Education Details  quad strengthening and hamstring stretch    Person(s) Educated  Patient    Methods  Explanation;Demonstration;Verbal cues    Comprehension  Verbalized understanding;Returned demonstration       PT Short Term Goals - 11/17/16 1423      PT SHORT TERM GOAL #1   Title  Patient will perform three five consecutive sit to stands in a row without a break demonstrating increased functional strength.     Baseline  requires rest breaks between sit to stands and excessive UE assist     Time  2    Period  Weeks    Status  New    Target Date  12/01/16      PT SHORT TERM GOAL #2   Title  Patient will be independent in home exercise program to improve strength/mobility for better functional independence with ADLs.    Baseline  hep given    Time  2    Period  Weeks    Status  New    Target Date  12/01/16      PT SHORT TERM GOAL #3   Title  Patient will be modified independent in walking on even/uneven surface with least restrictive assistive device, for 10+ minutes without rest break, reporting some difficulty or less to improve walking tolerance with community ambulation including grocery shopping, going to [place]  require frequent rest breaks for ambulation    Time  2    Period  Weeks    Status  New    Target Date  12/01/16        PT Long Term Goals - 11/17/16 1426      PT LONG TERM GOAL #1   Title  Patient (> 75 years old) will complete five times sit to stand test in < 15 seconds indicating an increased LE strength and improved balance.    Baseline  11/6: 48 seconds    Time  8    Period  Weeks    Status  New    Target Date  01/12/17      PT LONG TERM GOAL #2   Title  Patient will increase 10 meter walk test to >1.76m/s as to improve gait speed for better community ambulation and to reduce fall risk.    Baseline  11/6: .11m/s    Time  8    Period  Weeks    Status  New    Target  Date  01/12/17      PT LONG TERM GOAL #3   Title  Patient will  reduce timed up and go to <11 seconds to reduce fall risk and demonstrate improved transfer/gait ability.    Baseline  11/6: 41 seconds    Time  8    Period  Weeks    Status  New    Target Date  01/12/17      PT LONG TERM GOAL #4   Title  Patient will increase lower extremity functional scale to >60/80 to demonstrate improved functional mobility and increased tolerance with ADLs    Baseline  11/6: 48/80    Time  8    Period  Weeks    Status  New    Target Date  01/12/17            Plan - 11/26/16 0908    Clinical Impression Statement  Patient quality of movement improves with repetitions. Weakness of R quadriceps significantly weaker than L and affects coordination of complex movements. Decreased awareness of limited weight acceptance over RLE noted. Patient will continue to benefit from skilled physical therapy services to increase mobility and strength of LE's to return to PLOF.     Rehab Potential  Fair    Clinical Impairments Affecting Rehab Potential  (-) R hip sx complications, consistant decline in function and mobility; (+) desire for progress,     PT Frequency  2x / week    PT Duration  8 weeks    PT Treatment/Interventions  ADLs/Self Care Home Management;Aquatic Therapy;Cryotherapy;Electrical Stimulation;Ultrasound;Traction;Moist Heat;Iontophoresis 4mg /ml Dexamethasone;DME Instruction;Gait training;Stair training;Functional mobility training;Therapeutic activities;Therapeutic exercise;Patient/family education;Neuromuscular re-education;Balance training;Manual techniques;Taping;Energy conservation;Passive range of motion    PT Next Visit Plan  review HEP, strengthen quad, stretch hamstring    PT Home Exercise Plan  see sheet    Consulted and Agree with Plan of Care  Patient       Patient will benefit from skilled therapeutic intervention in order to improve the following deficits and impairments:  Abnormal gait, Decreased knowledge of use of DME, Decreased coordination,  Decreased activity tolerance, Decreased balance, Decreased endurance, Decreased range of motion, Decreased safety awareness, Decreased mobility, Decreased strength, Difficulty walking, Impaired flexibility, Impaired perceived functional ability, Increased muscle spasms, Hypomobility, Postural dysfunction, Improper body mechanics, Pain  Visit Diagnosis: Muscle weakness (generalized)  Stiffness of right knee, not elsewhere classified  Other abnormalities of gait and mobility     Problem List Patient Active Problem List   Diagnosis Date Noted  . COPD (chronic obstructive pulmonary disease) ([place]) 08/11/2016  . Primary localized osteoarthritis of right hip 07/21/2016  Janna Arch, PT, DPT    Janna Arch 11/26/2016, 9:30 AM  [place] MAIN [facility] SERVICES [address] [place], [place], [address] Phone: [phone]   Fax:  [phone]  Name: HAILEYANN STAIGER MRN: [id number] Date of Birth: [birth year]

## 2016-12-01 ENCOUNTER — Ambulatory Visit: Payer: Medicare HMO

## 2016-12-01 DIAGNOSIS — M6281 Muscle weakness (generalized): Secondary | ICD-10-CM | POA: Diagnosis not present

## 2016-12-01 DIAGNOSIS — R2689 Other abnormalities of gait and mobility: Secondary | ICD-10-CM | POA: Diagnosis not present

## 2016-12-01 DIAGNOSIS — M25661 Stiffness of right knee, not elsewhere classified: Secondary | ICD-10-CM

## 2016-12-01 DIAGNOSIS — M25651 Stiffness of right hip, not elsewhere classified: Secondary | ICD-10-CM | POA: Diagnosis not present

## 2016-12-01 NOTE — Therapy (Signed)
[place] MAIN [facility] SERVICES [address] [place], [place], [address] Phone: [phone]   Fax:  [phone]  Physical Therapy Treatment  Patient Details  Name: Mackenzie Hill MRN: [id number] Date of Birth: 09/29/1940 Referring Provider: Damaris Hippo, PA   Encounter Date: 12/01/2016  PT End of Session - 12/01/16 0851    Visit Number  5    Number of Visits  16    Date for PT Re-Evaluation  01/12/17    Authorization - Visit Number  5    Authorization - Number of Visits  10    PT Start Time  0845    PT Stop Time  0930    PT Time Calculation (min)  45 min    Equipment Utilized During Treatment  Gait belt    Activity Tolerance  Patient tolerated treatment well;Patient limited by pain;Patient limited by fatigue    Behavior During Therapy  [facility] for tasks assessed/performed       Past Medical History:  Diagnosis Date  . Anemia   . Anemia, unspecified    Hgb 11.8 - 04/2013  . Arthritis   . Cataract    Bilateral  . CHF (congestive heart failure) ([place])   . Chronic kidney disease   . COPD (chronic obstructive pulmonary disease) ([place])   . DDD (degenerative disc disease), lumbar   . Degenerative arthritis   . Diabetes mellitus type 2, uncomplicated (HCC)    AODM (A1C 6.9%) 04/2013  . Diabetes mellitus without complication ([place])   . Dyspnea    with exertion  . Esophagitis   . Gout    Uric acid 5.4 - 08/2012  . Hypertension   . Obesity, unspecified   . Sarcoidosis, lung ([place])    reported by pt, Clinically without a biopsy  . Sleep apnea    OSA--Use C-PAP    Past Surgical History:  Procedure Laterality Date  . ABDOMINAL HYSTERECTOMY     age 65  . COLONOSCOPY  11/02/2005   Hyperplastic Polyp: CBF 10/2015; Recall Ltr mailed 08/27/2015 (dw)  . FOOT SURGERY Right   . HALLUX VALGUS REPAIR Left   . OOPHORECTOMY    . TOTAL HIP ARTHROPLASTY ANTERIOR APPROACH 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   Performed by Hessie Knows, MD at [facility] ORS    There were no  vitals filed for this visit.  Subjective Assessment - 12/01/16 0850    Subjective  Patient feels very stiff over the weekend. Did a lot of walking thursday and friday when cleaning house.     Pertinent History  Patient presents to physical therapy evaluation with SPC. Patient had R hip surgery on July 10th and ever since the surgery she has had difficulty bending her R knee. R knee is difficult to move, can't sit up and down without extreme difficulty. Had a cortisone shot in knees in August. 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     Limitations  Sitting;Standing;Walking;House hold activities;Other (comment)    How long can you sit comfortably?  20 minutes    How long can you stand comfortably?  20 minutes    How long can you walk comfortably?  20 minutes    Patient Stated Goals  sit down and get up easier.     Currently in Pain?  No/denies      Nustep lvl 1 3 minutes   Treat: 5x STS from raised plinth table 2x total, cueing for increased weight shift on RLE Heel slides 10x  RLE Heel slides with towel assist 10x  Hooklyingabduction15x orange theraband Hooklyingadduction15xwith green ball Hooklying marches with OTB 15x  glute squeezes 15x supine with 5 second holds  LAQ 2x15x L and R, cues for controlling eccentric motion Resisted knee flexion orange theraband 2x 15x L and R Seated marches2x 20xwith OTB  Seated hamstring stretch 3x30 seconds   Hamstring stretch 60 seconds Dorsiflexion overpressure 60 seconds Supine hip flexor stretch 60 seconds Supine IT band stretch 30 seconds                      PT Education - 12/01/16 0850    Education provided  Yes    Education Details  walking and stretching for improved mobility. functional strengthening    Person(s) Educated  Patient    Methods  Explanation;Demonstration;Verbal cues    Comprehension  Verbalized understanding;Returned demonstration       PT Short Term Goals  - 11/17/16 1423      PT SHORT TERM GOAL #1   Title  Patient will perform three five consecutive sit to stands in a row without a break demonstrating increased functional strength.     Baseline  requires rest breaks between sit to stands and excessive UE assist     Time  2    Period  Weeks    Status  New    Target Date  12/01/16      PT SHORT TERM GOAL #2   Title  Patient will be independent in home exercise program to improve strength/mobility for better functional independence with ADLs.    Baseline  hep given    Time  2    Period  Weeks    Status  New    Target Date  12/01/16      PT SHORT TERM GOAL #3   Title  Patient will be modified independent in walking on even/uneven surface with least restrictive assistive device, for 10+ minutes without rest break, reporting some difficulty or less to improve walking tolerance with community ambulation including grocery shopping, going to [place]  require frequent rest breaks for ambulation    Time  2    Period  Weeks    Status  New    Target Date  12/01/16        PT Long Term Goals - 11/17/16 1426      PT LONG TERM GOAL #1   Title  Patient (> 40 years old) will complete five times sit to stand test in < 15 seconds indicating an increased LE strength and improved balance.    Baseline  11/6: 48 seconds    Time  8    Period  Weeks    Status  New    Target Date  01/12/17      PT LONG TERM GOAL #2   Title  Patient will increase 10 meter walk test to >1.77m/s as to improve gait speed for better community ambulation and to reduce fall risk.    Baseline  11/6: .73m/s    Time  8    Period  Weeks    Status  New    Target Date  01/12/17      PT LONG TERM GOAL #3   Title  Patient will reduce timed up and go to <11 seconds to reduce fall risk and demonstrate improved transfer/gait ability.    Baseline  11/6: 41 seconds    Time  8    Period  Weeks  Status  New    Target Date  01/12/17      PT LONG TERM GOAL #4    Title  Patient will increase lower extremity functional scale to >60/80 to demonstrate improved functional mobility and increased tolerance with ADLs    Baseline  11/6: 48/80    Time  8    Period  Weeks    Status  New    Target Date  01/12/17            Plan - 12/01/16 5883    Clinical Impression Statement  PT. Reluctant to accept weight on RLE requiring frequent cueing for weight shift when performing sit to stands. Heel slides stiff for patient to perform. RLE weakness noted throughout session with decreased endurance and quick fatigue. Patient will continue to benefit from skilled physical therapy to increase mobility and strength of LE's to return to PLOF.     Rehab Potential  Fair    Clinical Impairments Affecting Rehab Potential  (-) R hip sx complications, consistant decline in function and mobility; (+) desire for progress,     PT Frequency  2x / week    PT Duration  8 weeks    PT Treatment/Interventions  ADLs/Self Care Home Management;Aquatic Therapy;Cryotherapy;Electrical Stimulation;Ultrasound;Traction;Moist Heat;Iontophoresis 4mg /ml Dexamethasone;DME Instruction;Gait training;Stair training;Functional mobility training;Therapeutic activities;Therapeutic exercise;Patient/family education;Neuromuscular re-education;Balance training;Manual techniques;Taping;Energy conservation;Passive range of motion    PT Next Visit Plan  review HEP, strengthen quad, stretch hamstring    PT Home Exercise Plan  see sheet    Consulted and Agree with Plan of Care  Patient       Patient will benefit from skilled therapeutic intervention in order to improve the following deficits and impairments:  Abnormal gait, Decreased knowledge of use of DME, Decreased coordination, Decreased activity tolerance, Decreased balance, Decreased endurance, Decreased range of motion, Decreased safety awareness, Decreased mobility, Decreased strength, Difficulty walking, Impaired flexibility, Impaired perceived  functional ability, Increased muscle spasms, Hypomobility, Postural dysfunction, Improper body mechanics, Pain  Visit Diagnosis: Muscle weakness (generalized)  Stiffness of right knee, not elsewhere classified  Other abnormalities of gait and mobility     Problem List Patient Active Problem List   Diagnosis Date Noted  . COPD (chronic obstructive pulmonary disease) ([place]) 08/11/2016  . Primary localized osteoarthritis of right hip 07/21/2016   Janna Arch, PT, DPT   Janna Arch 12/01/2016, 10:11 AM  [place] MAIN [facility] SERVICES [address] [place], [place], [address] Phone: [phone]   Fax:  [phone]  Name: Mackenzie Hill MRN: [id number] Date of Birth: 1940-07-11

## 2016-12-07 DIAGNOSIS — E119 Type 2 diabetes mellitus without complications: Secondary | ICD-10-CM | POA: Diagnosis not present

## 2016-12-07 DIAGNOSIS — Z79899 Other long term (current) drug therapy: Secondary | ICD-10-CM | POA: Diagnosis not present

## 2016-12-07 DIAGNOSIS — M150 Primary generalized (osteo)arthritis: Secondary | ICD-10-CM | POA: Diagnosis not present

## 2016-12-07 DIAGNOSIS — N184 Chronic kidney disease, stage 4 (severe): Secondary | ICD-10-CM | POA: Diagnosis not present

## 2016-12-07 DIAGNOSIS — D539 Nutritional anemia, unspecified: Secondary | ICD-10-CM | POA: Diagnosis not present

## 2016-12-07 DIAGNOSIS — Z794 Long term (current) use of insulin: Secondary | ICD-10-CM | POA: Diagnosis not present

## 2016-12-07 DIAGNOSIS — Z1329 Encounter for screening for other suspected endocrine disorder: Secondary | ICD-10-CM | POA: Diagnosis not present

## 2016-12-07 DIAGNOSIS — I10 Essential (primary) hypertension: Secondary | ICD-10-CM | POA: Diagnosis not present

## 2016-12-07 DIAGNOSIS — R0609 Other forms of dyspnea: Secondary | ICD-10-CM | POA: Diagnosis not present

## 2016-12-08 ENCOUNTER — Ambulatory Visit: Payer: Medicare HMO

## 2016-12-08 DIAGNOSIS — R2689 Other abnormalities of gait and mobility: Secondary | ICD-10-CM

## 2016-12-08 DIAGNOSIS — Z23 Encounter for immunization: Secondary | ICD-10-CM | POA: Diagnosis not present

## 2016-12-08 DIAGNOSIS — M25661 Stiffness of right knee, not elsewhere classified: Secondary | ICD-10-CM

## 2016-12-08 DIAGNOSIS — M6281 Muscle weakness (generalized): Secondary | ICD-10-CM | POA: Diagnosis not present

## 2016-12-08 DIAGNOSIS — M25651 Stiffness of right hip, not elsewhere classified: Secondary | ICD-10-CM | POA: Diagnosis not present

## 2016-12-08 NOTE — Therapy (Signed)
[place] MAIN [facility] SERVICES [address] [place], [place], [address] Phone: [phone]   Fax:  [phone]  Physical Therapy Treatment  Patient Details  Name: Mackenzie Hill MRN: [id number] Date of Birth: [birth year] Referring Provider: Damaris Hippo, PA   Encounter Date: 12/08/2016  PT End of Session - 12/08/16 0859    Visit Number  6    Number of Visits  16    Date for PT Re-Evaluation  01/12/17    Authorization - Visit Number  6    Authorization - Number of Visits  10    PT Start Time  0845    PT Stop Time  0930    PT Time Calculation (min)  45 min    Equipment Utilized During Treatment  Gait belt    Activity Tolerance  Patient tolerated treatment well;Patient limited by pain;Patient limited by fatigue    Behavior During Therapy  [facility] for tasks assessed/performed       Past Medical History:  Diagnosis Date  . Anemia   . Anemia, unspecified    Hgb 11.8 - 04/2013  . Arthritis   . Cataract    Bilateral  . CHF (congestive heart failure) ([place])   . Chronic kidney disease   . COPD (chronic obstructive pulmonary disease) ([place])   . DDD (degenerative disc disease), lumbar   . Degenerative arthritis   . Diabetes mellitus type 2, uncomplicated (HCC)    AODM (A1C 6.9%) 04/2013  . Diabetes mellitus without complication ([place])   . Dyspnea    with exertion  . Esophagitis   . Gout    Uric acid 5.4 - 08/2012  . Hypertension   . Obesity, unspecified   . Sarcoidosis, lung ([place])    reported by pt, Clinically without a biopsy  . Sleep apnea    OSA--Use C-PAP    Past Surgical History:  Procedure Laterality Date  . ABDOMINAL HYSTERECTOMY     age 76  . COLONOSCOPY  11/02/2005   Hyperplastic Polyp: CBF 10/2015; Recall Ltr mailed 08/27/2015 (dw)  . FOOT SURGERY Right   . HALLUX VALGUS REPAIR Left   . OOPHORECTOMY    . 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  Subjective Assessment - 12/08/16 0852    Subjective  Patient was sick since last session so did not move very  much. Feeling very stiff in total body.     Pertinent History  Patient presents to physical therapy evaluation with SPC. Patient had R hip surgery on July 10th and ever since the surgery she has had difficulty bending her R knee. R knee is difficult to move, can't sit up and down without extreme difficulty. Had a cortisone shot in knees in August. 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     Limitations  Sitting;Standing;Walking;House hold activities;Other (comment)    How long can you sit comfortably?  20 minutes    How long can you stand comfortably?  20 minutes    How long can you walk comfortably?  20 minutes    Patient Stated Goals  sit down and get up easier.     Currently in Pain?  Yes    Pain Score  3     Pain Location  Knee    Pain  Orientation  Right    Pain Descriptors / Indicators  Aching    Pain Type  Chronic pain         Treat: Clamshells sidelying 15x each side tactile cueing for positioning SAQ 2x 15x each side, cueing for body mechanics Swiss ball hamstring curls 15x  Heel slides 10x RLE  Hook lying marches 20x  Hooklying adduction 15x with green ball Hooklying marches with OTB 15x  glute squeezes 15x supine with 5 second holds LAQ 2x 15x L and R, cues for controlling eccentric motion Seated hamstring stretch 3x30 seconds  Seated postural exercises in front of mirror to correct alignment of R hip and trunk. Every time trunk is corrected excessive external rotation created.    Hamstring stretch 60 seconds Dorsiflexion overpressure 60 seconds Supine hip flexor stretch 60 seconds Supine IT band stretch 30 seconds Distraction 8x 10 second holds in varying planes on RLE.                         PT  Education - 12/08/16 0858    Education provided  Yes    Education Details  functional strenghtening of LE's    Person(s) Educated  Patient    Methods  Explanation;Demonstration;Verbal cues;Tactile cues    Comprehension  Verbalized understanding;Returned demonstration       PT Short Term Goals - 11/17/16 1423      PT SHORT TERM GOAL #1   Title  Patient will perform three five consecutive sit to stands in a row without a break demonstrating increased functional strength.     Baseline  requires rest breaks between sit to stands and excessive UE assist     Time  2    Period  Weeks    Status  New    Target Date  12/01/16      PT SHORT TERM GOAL #2   Title  Patient will be independent in home exercise program to improve strength/mobility for better functional independence with ADLs.    Baseline  hep given    Time  2    Period  Weeks    Status  New    Target Date  12/01/16      PT SHORT TERM GOAL #3   Title  Patient will be modified independent in walking on even/uneven surface with least restrictive assistive device, for 10+ minutes without rest break, reporting some difficulty or less to improve walking tolerance with community ambulation including grocery shopping, going to [place]  require frequent rest breaks for ambulation    Time  2    Period  Weeks    Status  New    Target Date  12/01/16        PT Long Term Goals - 11/17/16 1426      PT LONG TERM GOAL #1   Title  Patient (> 53 years old) will complete five times sit to stand test in < 15 seconds indicating an increased LE strength and improved balance.    Baseline  11/6: 48 seconds    Time  8    Period  Weeks    Status  New    Target Date  01/12/17      PT LONG TERM GOAL #2   Title  Patient will increase 10 meter walk test to >1.86m/s as to improve gait speed for better community ambulation and to reduce fall risk.    Baseline  11/6: .53m/s  Time  8    Period  Weeks    Status  New    Target Date   01/12/17      PT LONG TERM GOAL #3   Title  Patient will reduce timed up and go to <11 seconds to reduce fall risk and demonstrate improved transfer/gait ability.    Baseline  11/6: 41 seconds    Time  8    Period  Weeks    Status  New    Target Date  01/12/17      PT LONG TERM GOAL #4   Title  Patient will increase lower extremity functional scale to >60/80 to demonstrate improved functional mobility and increased tolerance with ADLs    Baseline  11/6: 48/80    Time  8    Period  Weeks    Status  New    Target Date  01/12/17            Plan - 12/08/16 0931    Clinical Impression Statement  Patient limited by hip flexion limitations s/p R hip surgery. R hip flexion only 111 degrees decreasing hamstring length. Patient fatigues quickly due to recent immobility from illness. Requires more tactile cueing for sequencing/body mechanics. Excessive external rotation in seated position noted with trunk correction. Knee pain and limitations relate directly to R hip abnormal movement.     Rehab Potential  Fair    Clinical Impairments Affecting Rehab Potential  (-) R hip sx complications, consistant decline in function and mobility; (+) desire for progress,     PT Frequency  2x / week    PT Duration  8 weeks    PT Treatment/Interventions  ADLs/Self Care Home Management;Aquatic Therapy;Cryotherapy;Electrical Stimulation;Ultrasound;Traction;Moist Heat;Iontophoresis 4mg /ml Dexamethasone;DME Instruction;Gait training;Stair training;Functional mobility training;Therapeutic activities;Therapeutic exercise;Patient/family education;Neuromuscular re-education;Balance training;Manual techniques;Taping;Energy conservation;Passive range of motion    PT Next Visit Plan  hip stretch    PT Home Exercise Plan  see sheet    Consulted and Agree with Plan of Care  Patient       Patient will benefit from skilled therapeutic intervention in order to improve the following deficits and impairments:  Abnormal  gait, Decreased knowledge of use of DME, Decreased coordination, Decreased activity tolerance, Decreased balance, Decreased endurance, Decreased range of motion, Decreased safety awareness, Decreased mobility, Decreased strength, Difficulty walking, Impaired flexibility, Impaired perceived functional ability, Increased muscle spasms, Hypomobility, Postural dysfunction, Improper body mechanics, Pain  Visit Diagnosis: Muscle weakness (generalized)  Stiffness of right knee, not elsewhere classified  Other abnormalities of gait and mobility     Problem List Patient Active Problem List   Diagnosis Date Noted  . COPD (chronic obstructive pulmonary disease) ([place]) 08/11/2016  . Primary localized osteoarthritis of right hip 07/21/2016   Janna Arch, PT, DPT   Janna Arch 12/08/2016, 9:32 AM  [place] MAIN [facility] SERVICES [address] [place], [place], [address] Phone: [phone]   Fax:  [phone]  Name: Mackenzie Hill MRN: [id number] Date of Birth: February 09, 1940

## 2016-12-10 ENCOUNTER — Ambulatory Visit: Payer: Medicare HMO

## 2016-12-10 DIAGNOSIS — R2689 Other abnormalities of gait and mobility: Secondary | ICD-10-CM

## 2016-12-10 DIAGNOSIS — M6281 Muscle weakness (generalized): Secondary | ICD-10-CM

## 2016-12-10 DIAGNOSIS — M25661 Stiffness of right knee, not elsewhere classified: Secondary | ICD-10-CM

## 2016-12-10 DIAGNOSIS — M25651 Stiffness of right hip, not elsewhere classified: Secondary | ICD-10-CM | POA: Diagnosis not present

## 2016-12-10 NOTE — Therapy (Signed)
[place] MAIN [facility] SERVICES [address] [place], [place], [address] Phone: [phone]   Fax:  [phone]  Physical Therapy Treatment  Patient Details  Name: Mackenzie Hill MRN: [id number] Date of Birth: [birth year] Referring Provider: Damaris Hippo, PA   Encounter Date: 12/10/2016  PT End of Session - 12/10/16 0927    Visit Number  7    Number of Visits  16    Date for PT Re-Evaluation  01/12/17    Authorization - Visit Number  7    Authorization - Number of Visits  10    PT Start Time  0845    PT Stop Time  0930    PT Time Calculation (min)  45 min    Equipment Utilized During Treatment  Gait belt    Activity Tolerance  Patient tolerated treatment well;Patient limited by pain;Patient limited by fatigue    Behavior During Therapy  [facility] for tasks assessed/performed       Past Medical History:  Diagnosis Date  . Anemia   . Anemia, unspecified    Hgb 11.8 - 04/2013  . Arthritis   . Cataract    Bilateral  . CHF (congestive heart failure) ([place])   . Chronic kidney disease   . COPD (chronic obstructive pulmonary disease) ([place])   . DDD (degenerative disc disease), lumbar   . Degenerative arthritis   . Diabetes mellitus type 2, uncomplicated (HCC)    AODM (A1C 6.9%) 04/2013  . Diabetes mellitus without complication ([place])   . Dyspnea    with exertion  . Esophagitis   . Gout    Uric acid 5.4 - 08/2012  . Hypertension   . Obesity, unspecified   . Sarcoidosis, lung ([place])    reported by pt, Clinically without a biopsy  . Sleep apnea    OSA--Use C-PAP    Past Surgical History:  Procedure Laterality Date  . ABDOMINAL HYSTERECTOMY     age 15  . COLONOSCOPY  11/02/2005   Hyperplastic Polyp: CBF 10/2015; Recall Ltr mailed 08/27/2015 (dw)  . FOOT SURGERY Right   . HALLUX VALGUS REPAIR Left   . OOPHORECTOMY    . 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  Subjective Assessment - 12/10/16 0847    Subjective  Patient feeling better since last session. Was able to get referral for hip now.     Pertinent History  Patient presents to physical therapy evaluation with SPC. Patient had R hip surgery on July 10th and ever since the surgery she has had difficulty bending her R knee. R knee is difficult to move, can't sit up and down without extreme difficulty. Had a cortisone shot in knees in August. 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     Limitations  Sitting;Standing;Walking;House hold activities;Other (comment)    How long can you sit comfortably?  20 minutes    How long can you stand comfortably?  20 minutes    How long can you walk comfortably?  20 minutes    Patient Stated Goals  sit down and get up easier.     Currently in Pain?  Yes    Pain Score  4     Pain Location  Hip    Pain Orientation  Right  Pain Descriptors / Indicators  Aching    Pain Type  Chronic pain    Pain Onset  1 to 4 weeks ago    Pain Frequency  Constant    Aggravating Factors   sit to stand         R hip: Unable to perform SLR Supine PROM   40 degrees hip flexion   10 degrees hip abduction   -33  Degrees hip extension  SAD with belt 10x 15 second holds lateral and inferior with no increase in pain  AP grade I-II mobilizations with hypomobility noted in R hip    TherEx Standing hip flexor stretch 3x 60 seconds  hip flexion 10x in // bars (BLE) BUE support Hip extension 10x in // bars (BLE) BUE support Hip abduction 10x in // bars (BLE) BUE support cues for lateral movement of hip rather than compensatory flexion  Clamshells 10x sidelying with RLE.   Sit to stand from raised surfaces                      PT Education - 12/10/16 0849    Education provided  Yes    Education Details  Hip ROM  and strength , new HEp     Person(s) Educated  Patient    Methods  Explanation;Demonstration;Verbal cues    Comprehension  Verbalized understanding;Returned demonstration       PT Short Term Goals - 11/17/16 1423      PT SHORT TERM GOAL #1   Title  Patient will perform three five consecutive sit to stands in a row without a break demonstrating increased functional strength.     Baseline  requires rest breaks between sit to stands and excessive UE assist     Time  2    Period  Weeks    Status  New    Target Date  12/01/16      PT SHORT TERM GOAL #2   Title  Patient will be independent in home exercise program to improve strength/mobility for better functional independence with ADLs.    Baseline  hep given    Time  2    Period  Weeks    Status  New    Target Date  12/01/16      PT SHORT TERM GOAL #3   Title  Patient will be modified independent in walking on even/uneven surface with least restrictive assistive device, for 10+ minutes without rest break, reporting some difficulty or less to improve walking tolerance with community ambulation including grocery shopping, going to [place]  require frequent rest breaks for ambulation    Time  2    Period  Weeks    Status  New    Target Date  12/01/16        PT Long Term Goals - 11/17/16 1426      PT LONG TERM GOAL #1   Title  Patient (> 23 years old) will complete five times sit to stand test in < 15 seconds indicating an increased LE strength and improved balance.    Baseline  11/6: 48 seconds    Time  8    Period  Weeks    Status  New    Target Date  01/12/17      PT LONG TERM GOAL #2   Title  Patient will increase 10 meter walk test to >1.5m/s as to improve gait speed for better community ambulation and to reduce fall  risk.    Baseline  11/6: .50m/s    Time  8    Period  Weeks    Status  New    Target Date  01/12/17      PT LONG TERM GOAL #3   Title  Patient will reduce timed up and go to <11 seconds  to reduce fall risk and demonstrate improved transfer/gait ability.    Baseline  11/6: 41 seconds    Time  8    Period  Weeks    Status  New    Target Date  01/12/17      PT LONG TERM GOAL #4   Title  Patient will increase lower extremity functional scale to >60/80 to demonstrate improved functional mobility and increased tolerance with ADLs    Baseline  11/6: 48/80    Time  8    Period  Weeks    Status  New    Target Date  01/12/17            Plan - 12/10/16 4315    Clinical Impression Statement  Patient has limited R hip mobility with passive and active ROM. Hip flexion 40 degrees, abduction 10 degrees, and hip extension -33 degrees. Patient unable to put hip into neutral position at this time. Mobilizations of hip hypomobile. Gross weakness of hip musculature noted with pt. Unable to perform SLR. Stretches and strengthening interventions implemented. Patient will benefit from skilled physical therapy to increase R hip motion, decrease knee and hip pain, and improve gait and mobility.     Rehab Potential  Fair    Clinical Impairments Affecting Rehab Potential  (-) R hip sx complications, consistant decline in function and mobility; (+) desire for progress,     PT Frequency  2x / week    PT Duration  8 weeks    PT Treatment/Interventions  ADLs/Self Care Home Management;Aquatic Therapy;Cryotherapy;Electrical Stimulation;Ultrasound;Traction;Moist Heat;Iontophoresis 4mg /ml Dexamethasone;DME Instruction;Gait training;Stair training;Functional mobility training;Therapeutic activities;Therapeutic exercise;Patient/family education;Neuromuscular re-education;Balance training;Manual techniques;Taping;Energy conservation;Passive range of motion    PT Next Visit Plan  hip stretch    PT Home Exercise Plan  see sheet    Consulted and Agree with Plan of Care  Patient       Patient will benefit from skilled therapeutic intervention in order to improve the following deficits and impairments:   Abnormal gait, Decreased knowledge of use of DME, Decreased coordination, Decreased activity tolerance, Decreased balance, Decreased endurance, Decreased range of motion, Decreased safety awareness, Decreased mobility, Decreased strength, Difficulty walking, Impaired flexibility, Impaired perceived functional ability, Increased muscle spasms, Hypomobility, Postural dysfunction, Improper body mechanics, Pain  Visit Diagnosis: Muscle weakness (generalized)  Stiffness of right knee, not elsewhere classified  Other abnormalities of gait and mobility  Stiffness of right hip, not elsewhere classified     Problem List Patient Active Problem List   Diagnosis Date Noted  . COPD (chronic obstructive pulmonary disease) ([place]) 08/11/2016  . Primary localized osteoarthritis of right hip 07/21/2016   Janna Arch, PT, DPT   Janna Arch 12/10/2016, 12:05 PM  [place] MAIN [facility] SERVICES [address] [place], [place], [address] Phone: [phone]   Fax:  [phone]  Name: Mackenzie Hill MRN: [id number] Date of Birth: 08-15-1940

## 2016-12-14 DIAGNOSIS — R0609 Other forms of dyspnea: Secondary | ICD-10-CM | POA: Diagnosis not present

## 2016-12-15 ENCOUNTER — Ambulatory Visit: Payer: Medicare HMO | Attending: Student

## 2016-12-15 DIAGNOSIS — R2689 Other abnormalities of gait and mobility: Secondary | ICD-10-CM

## 2016-12-15 DIAGNOSIS — M6281 Muscle weakness (generalized): Secondary | ICD-10-CM | POA: Insufficient documentation

## 2016-12-15 DIAGNOSIS — M25651 Stiffness of right hip, not elsewhere classified: Secondary | ICD-10-CM

## 2016-12-15 DIAGNOSIS — M25661 Stiffness of right knee, not elsewhere classified: Secondary | ICD-10-CM | POA: Insufficient documentation

## 2016-12-15 NOTE — Therapy (Signed)
[place] MAIN [facility] SERVICES [address] [place], [place], [address] Phone: [phone]   Fax:  [phone]  Physical Therapy Treatment  Patient Details  Name: Mackenzie Hill MRN: [id number] Date of Birth: [birth year] Referring Provider: Damaris Hippo, PA   Encounter Date: 12/15/2016  PT End of Session - 12/15/16 0956    Visit Number  8    Number of Visits  16    Date for PT Re-Evaluation  01/12/17    Authorization - Visit Number  8    Authorization - Number of Visits  10    PT Start Time  0930    PT Stop Time  1015    PT Time Calculation (min)  45 min    Equipment Utilized During Treatment  Gait belt    Activity Tolerance  Patient tolerated treatment well;Patient limited by pain;Patient limited by fatigue    Behavior During Therapy  [facility] for tasks assessed/performed       Past Medical History:  Diagnosis Date  . Anemia   . Anemia, unspecified    Hgb 11.8 - 04/2013  . Arthritis   . Cataract    Bilateral  . CHF (congestive heart failure) ([place])   . Chronic kidney disease   . COPD (chronic obstructive pulmonary disease) ([place])   . DDD (degenerative disc disease), lumbar   . Degenerative arthritis   . Diabetes mellitus type 2, uncomplicated (HCC)    AODM (A1C 6.9%) 04/2013  . Diabetes mellitus without complication ([place])   . Dyspnea    with exertion  . Esophagitis   . Gout    Uric acid 5.4 - 08/2012  . Hypertension   . Obesity, unspecified   . Sarcoidosis, lung ([place])    reported by pt, Clinically without a biopsy  . Sleep apnea    OSA--Use C-PAP    Past Surgical History:  Procedure Laterality Date  . ABDOMINAL HYSTERECTOMY     age 22  . COLONOSCOPY  11/02/2005   Hyperplastic Polyp: CBF 10/2015; Recall Ltr mailed 08/27/2015 (dw)  . FOOT SURGERY Right   . HALLUX VALGUS REPAIR Left   . OOPHORECTOMY    . 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  Subjective Assessment - 12/15/16 0934    Subjective  pt. was sore after session thursday and day after on friday. Did exercises 3x/day. Feeling extra stiff today. Came at wrong time for appointment so had to wait for next opening.     Pertinent History  Patient presents to physical therapy evaluation with SPC. Patient had R hip surgery on July 10th and ever since the surgery she has had difficulty bending her R knee. R knee is difficult to move, can't sit up and down without extreme difficulty. Had a cortisone shot in knees in August. 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     Limitations  Sitting;Standing;Walking;House hold activities;Other (comment)    How long can you sit comfortably?  20 minutes    How long can you stand comfortably?  20 minutes    How long can you walk comfortably?  20 minutes    Patient Stated Goals  sit down and get up easier.     Currently in Pain?  Yes    Pain Score  4  Pain Location  Hip    Pain Orientation  Right    Pain Descriptors / Indicators  Aching    Pain Type  Chronic pain;Surgical pain    Pain Onset  1 to 4 weeks ago         Prone on large mat table:   Hip flexor manual stretch 3x 30 seconds  PA mobilization grade I-III hypombile, noted ER of hip placement   Prone donkey kicks modified RLE 10x   PROM stretch hip extension 2x 30 seconds, IR 2x 30 seconds.    Supine:   AP mobilizations grade I-III hypomobile  SAD with belt laterally and inferiorly 10x 15 seconds   Stretch in hooklying into IR to stretch ER musculature  Hamstring stretch with df overpressure 60 seconds   Adductor squeeze with Green ball  Isometrics hip strengthening: flexion, abduction, adduction, extension, IR, ER 10x 10 second holds   Seated IR RLE  with RTB around ankles 10x step outs   Seated eversion with RTB around toes    Pt.  response to medical necessity:  Patient will continue to benefit from skilled physical therapy to increase R hip motion, decrease knee and hip pain, and improve gait and mobility.                    PT Education - 12/15/16 0956    Education provided  Yes    Education Details  HIP mobility and ROM.     Person(s) Educated  Patient    Methods  Explanation;Demonstration;Verbal cues;Tactile cues    Comprehension  Returned demonstration;Verbalized understanding       PT Short Term Goals - 11/17/16 1423      PT SHORT TERM GOAL #1   Title  Patient will perform three five consecutive sit to stands in a row without a break demonstrating increased functional strength.     Baseline  requires rest breaks between sit to stands and excessive UE assist     Time  2    Period  Weeks    Status  New    Target Date  12/01/16      PT SHORT TERM GOAL #2   Title  Patient will be independent in home exercise program to improve strength/mobility for better functional independence with ADLs.    Baseline  hep given    Time  2    Period  Weeks    Status  New    Target Date  12/01/16      PT SHORT TERM GOAL #3   Title  Patient will be modified independent in walking on even/uneven surface with least restrictive assistive device, for 10+ minutes without rest break, reporting some difficulty or less to improve walking tolerance with community ambulation including grocery shopping, going to [place]  require frequent rest breaks for ambulation    Time  2    Period  Weeks    Status  New    Target Date  12/01/16        PT Long Term Goals - 11/17/16 1426      PT LONG TERM GOAL #1   Title  Patient (> 73 years old) will complete five times sit to stand test in < 15 seconds indicating an increased LE strength and improved balance.    Baseline  11/6: 48 seconds    Time  8    Period  Weeks    Status  New    Target Date  01/12/17      PT LONG TERM GOAL #2   Title  Patient  will increase 10 meter walk test to >1.46m/s as to improve gait speed for better community ambulation and to reduce fall risk.    Baseline  11/6: .35m/s    Time  8    Period  Weeks    Status  New    Target Date  01/12/17      PT LONG TERM GOAL #3   Title  Patient will reduce timed up and go to <11 seconds to reduce fall risk and demonstrate improved transfer/gait ability.    Baseline  11/6: 41 seconds    Time  8    Period  Weeks    Status  New    Target Date  01/12/17      PT LONG TERM GOAL #4   Title  Patient will increase lower extremity functional scale to >60/80 to demonstrate improved functional mobility and increased tolerance with ADLs    Baseline  11/6: 48/80    Time  8    Period  Weeks    Status  New    Target Date  01/12/17            Plan - 12/15/16 1018    Clinical Impression Statement  Patient able to obtain prone position with Mod A with no increase in pain. Prolonged holds for increased muscle length performed after mobilizations for corrective bony alignment. Patient's excessive external rotation positioning exacerbated with fatigue. Patient will continue to benefit from skilled physical therapy to increase R hip motion, decrease knee and hip pain, and improve gait and mobility.     Rehab Potential  Fair    Clinical Impairments Affecting Rehab Potential  (-) R hip sx complications, consistant decline in function and mobility; (+) desire for progress,     PT Frequency  2x / week    PT Duration  8 weeks    PT Treatment/Interventions  ADLs/Self Care Home Management;Aquatic Therapy;Cryotherapy;Electrical Stimulation;Ultrasound;Traction;Moist Heat;Iontophoresis 4mg /ml Dexamethasone;DME Instruction;Gait training;Stair training;Functional mobility training;Therapeutic activities;Therapeutic exercise;Patient/family education;Neuromuscular re-education;Balance training;Manual techniques;Taping;Energy conservation;Passive range of motion    PT Next Visit Plan  hip stretch     PT Home Exercise Plan  see sheet    Consulted and Agree with Plan of Care  Patient       Patient will benefit from skilled therapeutic intervention in order to improve the following deficits and impairments:  Abnormal gait, Decreased knowledge of use of DME, Decreased coordination, Decreased activity tolerance, Decreased balance, Decreased endurance, Decreased range of motion, Decreased safety awareness, Decreased mobility, Decreased strength, Difficulty walking, Impaired flexibility, Impaired perceived functional ability, Increased muscle spasms, Hypomobility, Postural dysfunction, Improper body mechanics, Pain  Visit Diagnosis: Muscle weakness (generalized)  Stiffness of right knee, not elsewhere classified  Other abnormalities of gait and mobility  Stiffness of right hip, not elsewhere classified     Problem List Patient Active Problem List   Diagnosis Date Noted  . COPD (chronic obstructive pulmonary disease) ([place]) 08/11/2016  . Primary localized osteoarthritis of right hip 07/21/2016   Janna Arch, PT, DPT   Janna Arch 12/15/2016, 10:19 AM  [place] MAIN [facility] SERVICES [address] [place], [place], [address] Phone: [phone]   Fax:  [phone]  Name: Mackenzie Hill MRN: [id number] Date of Birth: 11-28-1940

## 2016-12-17 ENCOUNTER — Other Ambulatory Visit: Payer: Self-pay

## 2016-12-17 ENCOUNTER — Encounter: Payer: Self-pay | Admitting: Physical Therapy

## 2016-12-17 ENCOUNTER — Ambulatory Visit: Payer: Medicare HMO | Admitting: Physical Therapy

## 2016-12-17 VITALS — BP 140/66

## 2016-12-17 DIAGNOSIS — M25661 Stiffness of right knee, not elsewhere classified: Secondary | ICD-10-CM

## 2016-12-17 DIAGNOSIS — R2689 Other abnormalities of gait and mobility: Secondary | ICD-10-CM | POA: Diagnosis not present

## 2016-12-17 DIAGNOSIS — M25651 Stiffness of right hip, not elsewhere classified: Secondary | ICD-10-CM

## 2016-12-17 DIAGNOSIS — M6281 Muscle weakness (generalized): Secondary | ICD-10-CM | POA: Diagnosis not present

## 2016-12-17 NOTE — Therapy (Signed)
[place] MAIN [facility] SERVICES [address] [place], [place], [address] Phone: [phone]   Fax:  [phone]  Physical Therapy Treatment  Patient Details  Name: Mackenzie Hill MRN: [id number] Date of Birth: [birth year] Referring Provider: Damaris Hippo, PA   Encounter Date: 12/17/2016  PT End of Session - 12/17/16 0803    Visit Number  9    Number of Visits  16    Date for PT Re-Evaluation  01/12/17    Authorization - Visit Number  9    Authorization - Number of Visits  10    PT Start Time  0803    PT Stop Time  7408    PT Time Calculation (min)  46 min    Equipment Utilized During Treatment  Gait belt    Activity Tolerance  Patient tolerated treatment well;Patient limited by pain;Patient limited by fatigue    Behavior During Therapy  [facility] for tasks assessed/performed       Past Medical History:  Diagnosis Date  . Anemia   . Anemia, unspecified    Hgb 11.8 - 04/2013  . Arthritis   . Cataract    Bilateral  . CHF (congestive heart failure) ([place])   . Chronic kidney disease   . COPD (chronic obstructive pulmonary disease) ([place])   . DDD (degenerative disc disease), lumbar   . Degenerative arthritis   . Diabetes mellitus type 2, uncomplicated (HCC)    AODM (A1C 6.9%) 04/2013  . Diabetes mellitus without complication ([place])   . Dyspnea    with exertion  . Esophagitis   . Gout    Uric acid 5.4 - 08/2012  . Hypertension   . Obesity, unspecified   . Sarcoidosis, lung ([place])    reported by pt, Clinically without a biopsy  . Sleep apnea    OSA--Use C-PAP    Past Surgical History:  Procedure Laterality Date  . ABDOMINAL HYSTERECTOMY     age 64  . COLONOSCOPY  11/02/2005   Hyperplastic Polyp: CBF 10/2015; Recall Ltr mailed 08/27/2015 (dw)  . FOOT SURGERY Right   . HALLUX VALGUS REPAIR Left   . OOPHORECTOMY    . 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  Pt has been completing her HEP 3x/day without any questions or concerns.  No new complaints or concerns today.     Pertinent History  Patient presents to physical therapy evaluation with SPC. Patient had R hip surgery on July 10th and ever since the surgery she has had difficulty bending her R knee. R knee is difficult to move, can't sit up and down without extreme difficulty. Had a cortisone shot in knees in August. 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     Limitations  Sitting;Standing;Walking;House hold activities;Other (comment)    How long can you sit comfortably?  20 minutes    How long can you stand comfortably?  20 minutes    How long can you walk comfortably?  20 minutes    Patient Stated Goals  sit down and get up easier.     Currently in Pain?  Yes    Pain Score  3  Pain Location  Knee    Pain Orientation  Right    Pain Descriptors / Indicators  Aching    Pain Onset  1 to 4 weeks ago    Multiple Pain Sites  No        TREATMENT   Manual Therapy:  R hip SAD 3x30 seconds  R hip AP grade III-IV mobilizations 3 bouts x 30 seconds each  R hip PA grade III-IV mobilizations 3 bouts x 30 seconds each  Sidelying R hip flexor stretch with 30 second holds x3?  ???????  Therapeutic Exercise:  Bridges 2x10, very challenging for the pt  Sit to stand from raised mat table x5. Cues to scoot to edge of seat and to flex knees Bil for greater mechanical advantage. Pt requires use of UEs.  Standing hip flexor stretch 5x 10 seconds  Hip flexion 10x in // bars (BLE) BUE support with cues to avoid compensatory trunk extension  Hip extension 10x in // bars (BLE) BUE support  Hip abduction 10x in // bars (BLE) BUE support       PT Education - 12/17/16 0803     Education provided  Yes    Education Details  Exercise technique; reasoning behind interventions    Person(s) Educated  Patient    Methods  Explanation;Demonstration;Verbal cues    Comprehension  Verbalized understanding;Returned demonstration;Verbal cues required;Need further instruction       PT Short Term Goals - 11/17/16 1423      PT SHORT TERM GOAL #1   Title  Patient will perform three five consecutive sit to stands in a row without a break demonstrating increased functional strength.     Baseline  requires rest breaks between sit to stands and excessive UE assist     Time  2    Period  Weeks    Status  New    Target Date  12/01/16      PT SHORT TERM GOAL #2   Title  Patient will be independent in home exercise program to improve strength/mobility for better functional independence with ADLs.    Baseline  hep given    Time  2    Period  Weeks    Status  New    Target Date  12/01/16      PT SHORT TERM GOAL #3   Title  Patient will be modified independent in walking on even/uneven surface with least restrictive assistive device, for 10+ minutes without rest break, reporting some difficulty or less to improve walking tolerance with community ambulation including grocery shopping, going to [place]  require frequent rest breaks for ambulation    Time  2    Period  Weeks    Status  New    Target Date  12/01/16        PT Long Term Goals - 11/17/16 1426      PT LONG TERM GOAL #1   Title  Patient (> 80 years old) will complete five times sit to stand test in < 15 seconds indicating an increased LE strength and improved balance.    Baseline  11/6: 48 seconds    Time  8    Period  Weeks    Status  New    Target Date  01/12/17      PT LONG TERM GOAL #2   Title  Patient will increase 10 meter walk test to >1.58m/s as to improve gait speed for better community ambulation and to reduce  fall risk.    Baseline  11/6: .27m/s    Time  8    Period  Weeks     Status  New    Target Date  01/12/17      PT LONG TERM GOAL #3   Title  Patient will reduce timed up and go to <11 seconds to reduce fall risk and demonstrate improved transfer/gait ability.    Baseline  11/6: 41 seconds    Time  8    Period  Weeks    Status  New    Target Date  01/12/17      PT LONG TERM GOAL #4   Title  Patient will increase lower extremity functional scale to >60/80 to demonstrate improved functional mobility and increased tolerance with ADLs    Baseline  11/6: 48/80    Time  8    Period  Weeks    Status  New    Target Date  01/12/17            Plan - 12/17/16 1093    Clinical Impression Statement  Pt demonstrates significant R hip flexor tightness so a manual hip flexor stretch was introduced this session followed by standing hip flexor stretch.  Continued glute strengthening and pt demonstrated fatigue toward end of each set.  She will benefit from continued skilled PT interventions for improved strength, posture, and reduced pain.      Rehab Potential  Fair    Clinical Impairments Affecting Rehab Potential  (-) R hip sx complications, consistant decline in function and mobility; (+) desire for progress,     PT Frequency  2x / week    PT Duration  8 weeks    PT Treatment/Interventions  ADLs/Self Care Home Management;Aquatic Therapy;Cryotherapy;Electrical Stimulation;Ultrasound;Traction;Moist Heat;Iontophoresis 4mg /ml Dexamethasone;DME Instruction;Gait training;Stair training;Functional mobility training;Therapeutic activities;Therapeutic exercise;Patient/family education;Neuromuscular re-education;Balance training;Manual techniques;Taping;Energy conservation;Passive range of motion    PT Next Visit Plan  hip stretch    PT Home Exercise Plan  see sheet    Consulted and Agree with Plan of Care  Patient       Patient will benefit from skilled therapeutic intervention in order to improve the following deficits and impairments:  Abnormal gait, Decreased  knowledge of use of DME, Decreased coordination, Decreased activity tolerance, Decreased balance, Decreased endurance, Decreased range of motion, Decreased safety awareness, Decreased mobility, Decreased strength, Difficulty walking, Impaired flexibility, Impaired perceived functional ability, Increased muscle spasms, Hypomobility, Postural dysfunction, Improper body mechanics, Pain  Visit Diagnosis: Muscle weakness (generalized)  Stiffness of right knee, not elsewhere classified  Other abnormalities of gait and mobility  Stiffness of right hip, not elsewhere classified     Problem List Patient Active Problem List   Diagnosis Date Noted  . COPD (chronic obstructive pulmonary disease) ([place]) 08/11/2016  . Primary localized osteoarthritis of right hip 07/21/2016    Collie Siad PT, DPT 12/17/2016, 8:53 AM  [place] MAIN [facility] SERVICES [address] [place], [place], [address] Phone: [phone]   Fax:  [phone]  Name: Mackenzie Hill MRN: [id number] Date of Birth: [birth year]

## 2016-12-28 ENCOUNTER — Ambulatory Visit: Payer: Medicare HMO

## 2016-12-28 DIAGNOSIS — M25661 Stiffness of right knee, not elsewhere classified: Secondary | ICD-10-CM

## 2016-12-28 DIAGNOSIS — R29898 Other symptoms and signs involving the musculoskeletal system: Secondary | ICD-10-CM | POA: Diagnosis not present

## 2016-12-28 DIAGNOSIS — M25651 Stiffness of right hip, not elsewhere classified: Secondary | ICD-10-CM | POA: Diagnosis not present

## 2016-12-28 DIAGNOSIS — Z96649 Presence of unspecified artificial hip joint: Secondary | ICD-10-CM | POA: Diagnosis not present

## 2016-12-28 DIAGNOSIS — R2689 Other abnormalities of gait and mobility: Secondary | ICD-10-CM | POA: Diagnosis not present

## 2016-12-28 DIAGNOSIS — M6281 Muscle weakness (generalized): Secondary | ICD-10-CM

## 2016-12-28 NOTE — Therapy (Signed)
[place] MAIN [facility] SERVICES [address] [place], [place], [address] Phone: [phone]   Fax:  [phone]  Physical Therapy Treatment  Patient Details  Name: Mackenzie Hill MRN: [id number] Date of Birth: Jun 03, 1940 Referring Provider: Damaris Hippo, PA   Encounter Date: 12/28/2016  PT End of Session - 12/28/16 1131    Visit Number  10    Number of Visits  16    Date for PT Re-Evaluation  01/12/17    Authorization Type  next will be 1/10    Authorization - Visit Number  10    Authorization - Number of Visits  10    PT Start Time  2751    PT Stop Time  1200    PT Time Calculation (min)  34 min    Equipment Utilized During Treatment  Gait belt    Activity Tolerance  Patient tolerated treatment well;Patient limited by pain;Patient limited by fatigue    Behavior During Therapy  [facility] for tasks assessed/performed       Past Medical History:  Diagnosis Date  . Anemia   . Anemia, unspecified    Hgb 11.8 - 04/2013  . Arthritis   . Cataract    Bilateral  . CHF (congestive heart failure) ([place])   . Chronic kidney disease   . COPD (chronic obstructive pulmonary disease) ([place])   . DDD (degenerative disc disease), lumbar   . Degenerative arthritis   . Diabetes mellitus type 2, uncomplicated (HCC)    AODM (A1C 6.9%) 04/2013  . Diabetes mellitus without complication ([place])   . Dyspnea    with exertion  . Esophagitis   . Gout    Uric acid 5.4 - 08/2012  . Hypertension   . Obesity, unspecified   . Sarcoidosis, lung ([place])    reported by pt, Clinically without a biopsy  . Sleep apnea    OSA--Use C-PAP    Past Surgical History:  Procedure Laterality Date  . ABDOMINAL HYSTERECTOMY     age 76  . COLONOSCOPY  11/02/2005   Hyperplastic Polyp: CBF 10/2015; Recall Ltr mailed 08/27/2015 (dw)  . FOOT SURGERY Right   . HALLUX VALGUS REPAIR Left   . OOPHORECTOMY    . 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  Subjective Assessment - 12/28/16 1129    Subjective  Patient reports going to the doctor today. She reports that her doctor wants her to continue therapy for her R hip and knee. Is late to session due to having her doctor appointment.     Pertinent History  Patient presents to physical therapy evaluation with SPC. Patient had R hip surgery on July 10th and ever since the surgery she has had difficulty bending her R knee. R knee is difficult to move, can't sit up and down without extreme difficulty. Had a cortisone shot in knees in August. 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     Limitations  Sitting;Standing;Walking;House hold activities;Other (comment)    How long can you sit comfortably?  20 minutes    How long can you stand comfortably?  20 minutes    How long can you walk comfortably?  20 minutes    Patient Stated Goals  sit down and get up easier.  Currently in Pain?  Yes    Pain Score  2     Pain Location  Leg    Pain Orientation  Right    Pain Descriptors / Indicators  Aching    Pain Type  Surgical pain;Chronic pain    Pain Onset  1 to 4 weeks ago    Pain Frequency  Constant          5x STS 40 seconds LEFS= 59/80   walking 10 minutes 10 min walk test 22 seconds=.45 m/s  TUG 33 sec      TherEx [place] stretch in // bars 2x 30 seconds each position for hip flexor stretch.  Large steps in // bars 2x length of bars  Seated adduction squeezes 12x 5 second squeezes  Seated LAQ 10x each leg 5 second holds                 PT Education - 12/28/16 1130    Education provided  Yes    Education Details  HEP compliance, goal progression, POC     Person(s) Educated  Patient    Methods  Explanation    Comprehension  Verbalized understanding       PT Short Term Goals -  12/28/16 1141      PT SHORT TERM GOAL #1   Title  Patient will perform three five consecutive sit to stands in a row without a break demonstrating increased functional strength.     Baseline  requires excessive UE assist    Time  2    Period  Weeks    Status  Partially Met      PT SHORT TERM GOAL #2   Title  Patient will be independent in home exercise program to improve strength/mobility for better functional independence with ADLs.    Baseline  Hep compliance    Time  2    Period  Weeks    Status  Achieved      PT SHORT TERM GOAL #3   Title  Patient will be modified independent in walking on even/uneven surface with least restrictive assistive device, for 10+ minutes without rest break, reporting some difficulty or less to improve walking tolerance with community ambulation including grocery shopping, going to church,etc    Baseline  10 minutes with rest breaks.     Time  2    Period  Weeks    Status  Partially Met        PT Long Term Goals - 12/28/16 1139      PT LONG TERM GOAL #1   Title  Patient (> 53 years old) will complete five times sit to stand test in < 15 seconds indicating an increased LE strength and improved balance.    Baseline  11/6: 48 seconds 12/17: 40 seconds    Time  8    Period  Weeks    Status  Partially Met      PT LONG TERM GOAL #2   Title  Patient will increase 10 meter walk test to >1.107ms as to improve gait speed for better community ambulation and to reduce fall risk.    Baseline  11/6: .371m 12/17: .45 m/s     Time  8    Period  Weeks    Status  Partially Met      PT LONG TERM GOAL #3   Title  Patient will reduce timed up and go to <11 seconds to reduce fall risk and demonstrate improved transfer/gait ability.  Baseline  11/6: 41 seconds 2023-01-28: 33 seconds    Time  8    Period  Weeks    Status  Partially Met      PT LONG TERM GOAL #4   Title  Patient will increase lower extremity functional scale to >60/80 to demonstrate improved  functional mobility and increased tolerance with ADLs    Baseline  11/6: 48/80  01/28/23: 59/80     Time  8    Period  Weeks    Status  Partially Met            Plan - 2017-01-27 1742    Clinical Impression Statement  Patient progressing towards goals at this time with increased functional ambulation and transfers. Mobility continues to be limited by R hip pain and contractions. 5x STS= 40 seconds, LEFS 59/90, 10 min walk test =.45 m/s and TUG =33 seconds with UE support. Patient will continue to benefit from skilled physical therapy for improved strength, posture, and reduced pain.     Rehab Potential  Fair    Clinical Impairments Affecting Rehab Potential  (-) R hip sx complications, consistant decline in function and mobility; (+) desire for progress,     PT Frequency  2x / week    PT Duration  8 weeks    PT Treatment/Interventions  ADLs/Self Care Home Management;Aquatic Therapy;Cryotherapy;Electrical Stimulation;Ultrasound;Traction;Moist Heat;Iontophoresis 80m/ml Dexamethasone;DME Instruction;Gait training;Stair training;Functional mobility training;Therapeutic activities;Therapeutic exercise;Patient/family education;Neuromuscular re-education;Balance training;Manual techniques;Taping;Energy conservation;Passive range of motion    PT Next Visit Plan  hip stretch    PT Home Exercise Plan  see sheet    Consulted and Agree with Plan of Care  Patient       Patient will benefit from skilled therapeutic intervention in order to improve the following deficits and impairments:  Abnormal gait, Decreased knowledge of use of DME, Decreased coordination, Decreased activity tolerance, Decreased balance, Decreased endurance, Decreased range of motion, Decreased safety awareness, Decreased mobility, Decreased strength, Difficulty walking, Impaired flexibility, Impaired perceived functional ability, Increased muscle spasms, Hypomobility, Postural dysfunction, Improper body mechanics, Pain  Visit  Diagnosis: Muscle weakness (generalized)  Stiffness of right knee, not elsewhere classified  Other abnormalities of gait and mobility  Stiffness of right hip, not elsewhere classified   G-Codes - 12019/01/161742    Functional Assessment Tool Used (Outpatient Only)  5xSTS, 10MWT, TUG, LEFS, clinical judgement,     Functional Limitation  Mobility: Walking and moving around    Mobility: Walking and Moving Around Current Status (([id number]  At least 40 percent but less than 60 percent impaired, limited or restricted    Mobility: Walking and Moving Around Goal Status [phone]  At least 20 percent but less than 40 percent impaired, limited or restricted       Problem List Patient Active Problem List   Diagnosis Date Noted  . COPD [place]ch[place] obstructive [place] disease) [place] 08/11/2016  . Primary localized osteoarthritis of right hip 07/21/2016   MJanna Arch PT, DPT   MJanna Arch12019-01-16 5:43 PM  [place] [facility] [address] [address]Phone: [phone]  Fax:  [phone] Name: Mackenzie DRENNANMRN: [id number] of Birth: 1Feb 10, 1942

## 2016-12-30 ENCOUNTER — Ambulatory Visit: Payer: Medicare HMO

## 2016-12-30 DIAGNOSIS — R2689 Other abnormalities of gait and mobility: Secondary | ICD-10-CM | POA: Diagnosis not present

## 2016-12-30 DIAGNOSIS — M6281 Muscle weakness (generalized): Secondary | ICD-10-CM | POA: Diagnosis not present

## 2016-12-30 DIAGNOSIS — M25661 Stiffness of right knee, not elsewhere classified: Secondary | ICD-10-CM | POA: Diagnosis not present

## 2016-12-30 DIAGNOSIS — M25651 Stiffness of right hip, not elsewhere classified: Secondary | ICD-10-CM

## 2016-12-30 NOTE — Therapy (Signed)
[place] MAIN [facility] SERVICES [address] [place], [place], [address] Phone: [phone]   Fax:  [phone]  Physical Therapy Treatment  Patient Details  Name: Mackenzie Hill MRN: [id number] Date of Birth: Jul 16, 1940 Referring Provider: Damaris Hippo, PA   Encounter Date: 12/30/2016  PT End of Session - 12/30/16 0949    Visit Number  11    Number of Visits  16    Date for PT Re-Evaluation  01/12/17    Authorization - Visit Number  1    Authorization - Number of Visits  10    PT Start Time  0930    PT Stop Time  1015    PT Time Calculation (min)  45 min    Equipment Utilized During Treatment  Gait belt    Activity Tolerance  Patient tolerated treatment well;Patient limited by pain;Patient limited by fatigue    Behavior During Therapy  [facility] for tasks assessed/performed       Past Medical History:  Diagnosis Date  . Anemia   . Anemia, unspecified    Hgb 11.8 - 04/2013  . Arthritis   . Cataract    Bilateral  . CHF (congestive heart failure) ([place])   . Chronic kidney disease   . COPD (chronic obstructive pulmonary disease) ([place])   . DDD (degenerative disc disease), lumbar   . Degenerative arthritis   . Diabetes mellitus type 2, uncomplicated (HCC)    AODM (A1C 6.9%) 04/2013  . Diabetes mellitus without complication ([place])   . Dyspnea    with exertion  . Esophagitis   . Gout    Uric acid 5.4 - 08/2012  . Hypertension   . Obesity, unspecified   . Sarcoidosis, lung ([place])    reported by pt, Clinically without a biopsy  . Sleep apnea    OSA--Use C-PAP    Past Surgical History:  Procedure Laterality Date  . ABDOMINAL HYSTERECTOMY     age 24  . COLONOSCOPY  11/02/2005   Hyperplastic Polyp: CBF 10/2015; Recall Ltr mailed 08/27/2015 (dw)  . FOOT SURGERY Right   . HALLUX VALGUS REPAIR Left   . OOPHORECTOMY    . 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  Subjective Assessment - 12/30/16 0935    Subjective  Patient reports feeling stiff today. Reports no pain today. Reports doing exercises 3x/day.     Pertinent History  Patient presents to physical therapy evaluation with SPC. Patient had R hip surgery on July 10th and ever since the surgery she has had difficulty bending her R knee. R knee is difficult to move, can't sit up and down without extreme difficulty. Had a cortisone shot in knees in August. 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     Limitations  Sitting;Standing;Walking;House hold activities;Other (comment)    How long can you sit comfortably?  20 minutes    How long can you stand comfortably?  20 minutes    How long can you walk comfortably?  20 minutes    Patient Stated Goals  sit down and get up easier.     Currently in Pain?  No/denies       Manual Therapy:  R hip SAD 3x30 seconds  R hip AP grade III-IV mobilizations 3 bouts x 30  seconds each  R hip PA grade III-IV mobilizations 3 bouts x 30 seconds each  Sidelying R hip flexor stretch with 30 second holds x3?   ???????  Therapeutic Exercise:  Prone hamstring curls 10x  Clamshells RLE 15x Reverse clamshells RLE 15x  Standing hip flexor stretch 2x 20 seconds each side   Hip flexion 10x in // bars (BLE) BUE support with cues to avoid compensatory trunk extension  Hip extension 10x in // bars (BLE) BUE support  Hip abduction 10x in // bars (BLE) BUE support  Hip abduction 10x in // bars (BLE) BUE support  Seated resisted hamstring curl OTB 2x12  (BLE)  Seated abduction OTB around ankles step outs onto PT foot 15x each leg   Pt. response to medical necessity:  therapy for improved strength, posture, and reduced pain.                        PT Education - 12/30/16 0948    Education provided  Yes     Education Details  HEP, goal progression, hip flexor stretch hamstring strength    Person(s) Educated  Patient    Methods  Explanation;Demonstration;Verbal cues    Comprehension  Verbalized understanding;Returned demonstration       PT Short Term Goals - 12/28/16 1141      PT SHORT TERM GOAL #1   Title  Patient will perform three five consecutive sit to stands in a row without a break demonstrating increased functional strength.     Baseline  requires excessive UE assist    Time  2    Period  Weeks    Status  Partially Met      PT SHORT TERM GOAL #2   Title  Patient will be independent in home exercise program to improve strength/mobility for better functional independence with ADLs.    Baseline  Hep compliance    Time  2    Period  Weeks    Status  Achieved      PT SHORT TERM GOAL #3   Title  Patient will be modified independent in walking on even/uneven surface with least restrictive assistive device, for 10+ minutes without rest break, reporting some difficulty or less to improve walking tolerance with community ambulation including grocery shopping, going to church,etc    Baseline  10 minutes with rest breaks.     Time  2    Period  Weeks    Status  Partially Met        PT Long Term Goals - 12/28/16 1139      PT LONG TERM GOAL #1   Title  Patient (> 3 years old) will complete five times sit to stand test in < 15 seconds indicating an increased LE strength and improved balance.    Baseline  11/6: 48 seconds 12/17: 40 seconds    Time  8    Period  Weeks    Status  Partially Met      PT LONG TERM GOAL #2   Title  Patient will increase 10 meter walk test to >1.64ms as to improve gait speed for better community ambulation and to reduce fall risk.    Baseline  11/6: .338m 12/17: .45 m/s     Time  8    Period  Weeks    Status  Partially Met      PT LONG TERM GOAL #3   Title  Patient will reduce timed up and go to <  11 seconds to reduce fall risk and demonstrate  improved transfer/gait ability.    Baseline  11/6: 41 seconds 12/17: 33 seconds    Time  8    Period  Weeks    Status  Partially Met      PT LONG TERM GOAL #4   Title  Patient will increase lower extremity functional scale to >60/80 to demonstrate improved functional mobility and increased tolerance with ADLs    Baseline  11/6: 48/80  12/17: 59/80     Time  8    Period  Weeks    Status  Partially Met            Plan - 12/30/16 0956    Clinical Impression Statement  Patient hip flexor tightness and contraction into slight hip R flexion leading to decreased strength of gluteal and hamstrings. Interventions to re-align bony alignment via mobilizations, stretches, and strengthening decrease patient's stiffness and improve mobility. Patient will continue to benefit from skilled physical therapy for improved strength, posture, and reduced pain.     Rehab Potential  Fair    Clinical Impairments Affecting Rehab Potential  (-) R hip sx complications, consistant decline in function and mobility; (+) desire for progress,     PT Frequency  2x / week    PT Duration  8 weeks    PT Treatment/Interventions  ADLs/Self Care Home Management;Aquatic Therapy;Cryotherapy;Electrical Stimulation;Ultrasound;Traction;Moist Heat;Iontophoresis 13m/ml Dexamethasone;DME Instruction;Gait training;Stair training;Functional mobility training;Therapeutic activities;Therapeutic exercise;Patient/family education;Neuromuscular re-education;Balance training;Manual techniques;Taping;Energy conservation;Passive range of motion    PT Next Visit Plan  hip stretch    PT Home Exercise Plan  see sheet    Consulted and Agree with Plan of Care  Patient       Patient will benefit from skilled therapeutic intervention in order to improve the following deficits and impairments:  Abnormal gait, Decreased knowledge of use of DME, Decreased coordination, Decreased activity tolerance, Decreased balance, Decreased endurance, Decreased  range of motion, Decreased safety awareness, Decreased mobility, Decreased strength, Difficulty walking, Impaired flexibility, Impaired perceived functional ability, Increased muscle spasms, Hypomobility, Postural dysfunction, Improper body mechanics, Pain  Visit Diagnosis: Muscle weakness (generalized)  Stiffness of right knee, not elsewhere classified  Other abnormalities of gait and mobility  Stiffness of right hip, not elsewhere classified     Problem List Patient Active Problem List   Diagnosis Date Noted  . COPD (chronic obstructive pulmonary disease) ([place] 08/11/2016  . Primary localized osteoarthritis of right hip 07/21/2016   MJanna Arch PT, DPT   MJanna Arch12/19/2018, 10:14 AM  [place] [address] [address]Phone: [phone]  Fax:  [phone] Name: Mackenzie STRIKEMRN: [id number] of Birth: [birth year]

## 2017-01-06 ENCOUNTER — Ambulatory Visit: Payer: Medicare HMO

## 2017-01-06 DIAGNOSIS — R2689 Other abnormalities of gait and mobility: Secondary | ICD-10-CM | POA: Diagnosis not present

## 2017-01-06 DIAGNOSIS — M25651 Stiffness of right hip, not elsewhere classified: Secondary | ICD-10-CM | POA: Diagnosis not present

## 2017-01-06 DIAGNOSIS — M6281 Muscle weakness (generalized): Secondary | ICD-10-CM | POA: Diagnosis not present

## 2017-01-06 DIAGNOSIS — M25661 Stiffness of right knee, not elsewhere classified: Secondary | ICD-10-CM | POA: Diagnosis not present

## 2017-01-06 DIAGNOSIS — Z1211 Encounter for screening for malignant neoplasm of colon: Secondary | ICD-10-CM | POA: Diagnosis not present

## 2017-01-06 NOTE — Therapy (Signed)
[place] MAIN [facility] SERVICES [address] [place], [place], [address] Phone: [phone]   Fax:  [phone]  Physical Therapy Treatment  Patient Details  Name: Mackenzie Hill MRN: [id number] Date of Birth: 04/20/1940 Referring Provider: Damaris Hippo, PA   Encounter Date: 01/06/2017  PT End of Session - 01/06/17 0950    Visit Number  12    Number of Visits  16    Date for PT Re-Evaluation  01/12/17    Authorization - Visit Number  2    Authorization - Number of Visits  10    PT Start Time  0930    PT Stop Time  1015    PT Time Calculation (min)  45 min    Equipment Utilized During Treatment  Gait belt    Activity Tolerance  Patient tolerated treatment well;Patient limited by pain;Patient limited by fatigue    Behavior During Therapy  [facility] for tasks assessed/performed       Past Medical History:  Diagnosis Date  . Anemia   . Anemia, unspecified    Hgb 11.8 - 04/2013  . Arthritis   . Cataract    Bilateral  . CHF (congestive heart failure) ([place])   . Chronic kidney disease   . COPD (chronic obstructive pulmonary disease) ([place])   . DDD (degenerative disc disease), lumbar   . Degenerative arthritis   . Diabetes mellitus type 2, uncomplicated (HCC)    AODM (A1C 6.9%) 04/2013  . Diabetes mellitus without complication ([place])   . Dyspnea    with exertion  . Esophagitis   . Gout    Uric acid 5.4 - 08/2012  . Hypertension   . Obesity, unspecified   . Sarcoidosis, lung ([place])    reported by pt, Clinically without a biopsy  . Sleep apnea    OSA--Use C-PAP    Past Surgical History:  Procedure Laterality Date  . ABDOMINAL HYSTERECTOMY     age 62  . COLONOSCOPY  11/02/2005   Hyperplastic Polyp: CBF 10/2015; Recall Ltr mailed 08/27/2015 (dw)  . FOOT SURGERY Right   . HALLUX VALGUS REPAIR Left   . OOPHORECTOMY    . 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  Subjective Assessment - 01/06/17 0934    Subjective  Patient cleaned all day friday, feeling worn out still from cleaning. Reports no pain just stiffness.     Pertinent History  Patient presents to physical therapy evaluation with SPC. Patient had R hip surgery on July 10th and ever since the surgery she has had difficulty bending her R knee. R knee is difficult to move, can't sit up and down without extreme difficulty. Had a cortisone shot in knees in August. 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     Limitations  Sitting;Standing;Walking;House hold activities;Other (comment)    How long can you sit comfortably?  20 minutes    How long can you stand comfortably?  20 minutes    How long can you walk comfortably?  20 minutes    Patient Stated Goals  sit down and get up easier.     Currently in Pain?  No/denies       Manual Therapy:  R hip SAD 3x30 seconds  R hip AP grade III-IV mobilizations 3  bouts x 30 seconds each  Sidelying R hip flexor stretch with 30 second holds x3?   ???????  Therapeutic Exercise:   Clamshells RLE 15x Reverse clamshells RLE 15x  Standing hip flexor stretch 2x 20 seconds each side   Hip extension 15x in // bars (BLE) BUE support , cues for upright posture, decreased range with RLE.  Hip abduction 15x in // bars (BLE) BUE support  Seated adduction with knee extension holding ball between feet : 2x 10x  Ambulating backwards in // bars, cues for upright posture. Good step length noted with decreased trunk flexion with repetition 8x.  Standing hamstring curls 10x BUE support, cues for keeping knee behind standing knee Seated resisted knee flexion 10x each leg OTB    Pt. response to medical necessity: Patient will continue to benefit from skilled physical therapy for improved strength, posture, and reduced pain.                                 PT Short Term Goals - 12/28/16 1141      PT SHORT TERM GOAL #1   Title  Patient will perform three five consecutive sit to stands in a row without a break demonstrating increased functional strength.     Baseline  requires excessive UE assist    Time  2    Period  Weeks    Status  Partially Met      PT SHORT TERM GOAL #2   Title  Patient will be independent in home exercise program to improve strength/mobility for better functional independence with ADLs.    Baseline  Hep compliance    Time  2    Period  Weeks    Status  Achieved      PT SHORT TERM GOAL #3   Title  Patient will be modified independent in walking on even/uneven surface with least restrictive assistive device, for 10+ minutes without rest break, reporting some difficulty or less to improve walking tolerance with community ambulation including grocery shopping, going to church,etc    Baseline  10 minutes with rest breaks.     Time  2    Period  Weeks    Status  Partially Met        PT Long Term Goals - 12/28/16 1139      PT LONG TERM GOAL #1   Title  Patient (> 79 years old) will complete five times sit to stand test in < 15 seconds indicating an increased LE strength and improved balance.    Baseline  11/6: 48 seconds 12/17: 40 seconds    Time  8    Period  Weeks    Status  Partially Met      PT LONG TERM GOAL #2   Title  Patient will increase 10 meter walk test to >1.31ms as to improve gait speed for better community ambulation and to reduce fall risk.    Baseline  11/6: .375m 12/17: .45 m/s     Time  8    Period  Weeks    Status  Partially Met      PT LONG TERM GOAL #3   Title  Patient will reduce timed up and go to <11 seconds to reduce fall risk and demonstrate improved transfer/gait ability.    Baseline  11/6: 41 seconds 12/17: 33 seconds    Time  8    Period  Weeks  Status  Partially Met      PT LONG TERM GOAL #4   Title  Patient will  increase lower extremity functional scale to >60/80 to demonstrate improved functional mobility and increased tolerance with ADLs    Baseline  11/6: 48/80  12/17: 59/80     Time  8    Period  Weeks    Status  Partially Met            Plan - 01/06/17 0953    Clinical Impression Statement  Patient demonstrates improved mobility of R hip. R hip continues to be placed in forward flexed position limited extension with noted decreased muscle activation of extensors and abductors. Patient challenged by prone position today due to stiffness of total body from recent cleaning spree limiting positioning for the session. Patient will continue to benefit from skilled physical therapy for improved strength, posture, and reduced pain.     Rehab Potential  Fair    Clinical Impairments Affecting Rehab Potential  (-) R hip sx complications, consistant decline in function and mobility; (+) desire for progress,     PT Frequency  2x / week    PT Duration  8 weeks    PT Treatment/Interventions  ADLs/Self Care Home Management;Aquatic Therapy;Cryotherapy;Electrical Stimulation;Ultrasound;Traction;Moist Heat;Iontophoresis 67m/ml Dexamethasone;DME Instruction;Gait training;Stair training;Functional mobility training;Therapeutic activities;Therapeutic exercise;Patient/family education;Neuromuscular re-education;Balance training;Manual techniques;Taping;Energy conservation;Passive range of motion    PT Next Visit Plan  hip stretch    PT Home Exercise Plan  see sheet    Consulted and Agree with Plan of Care  Patient       Patient will benefit from skilled therapeutic intervention in order to improve the following deficits and impairments:  Abnormal gait, Decreased knowledge of use of DME, Decreased coordination, Decreased activity tolerance, Decreased balance, Decreased endurance, Decreased range of motion, Decreased safety awareness, Decreased mobility, Decreased strength, Difficulty walking, Impaired flexibility,  Impaired perceived functional ability, Increased muscle spasms, Hypomobility, Postural dysfunction, Improper body mechanics, Pain  Visit Diagnosis: Muscle weakness (generalized)  Stiffness of right knee, not elsewhere classified  Other abnormalities of gait and mobility  Stiffness of right hip, not elsewhere classified     Problem List Patient Active Problem List   Diagnosis Date Noted  . COPD (ch[place] obstructive pulmonary disease) ([place] 08/11/2016  . Primary localized osteoarthritis of right hip 07/21/2016  MJanna Arch PT, DPT    MJanna Arch12/26/2018, 10:15 AM  [place]MAIN [facility]SERV[facility] [address] [address]Phone: [phone]  Fax:  [phone] Name: Mackenzie KISSELMRN: [id number] of Birth: [birth year]

## 2017-01-13 ENCOUNTER — Ambulatory Visit: Payer: Medicare HMO | Attending: Student

## 2017-01-13 DIAGNOSIS — M25651 Stiffness of right hip, not elsewhere classified: Secondary | ICD-10-CM | POA: Diagnosis not present

## 2017-01-13 DIAGNOSIS — R2689 Other abnormalities of gait and mobility: Secondary | ICD-10-CM | POA: Insufficient documentation

## 2017-01-13 DIAGNOSIS — M25661 Stiffness of right knee, not elsewhere classified: Secondary | ICD-10-CM | POA: Insufficient documentation

## 2017-01-13 DIAGNOSIS — M6281 Muscle weakness (generalized): Secondary | ICD-10-CM

## 2017-01-13 NOTE — Therapy (Signed)
[place] MAIN [facility] SERVICES [address] [place], [place], [address] Phone: [phone]   Fax:  [phone]  Physical Therapy Treatment  Patient Details  Name: Mackenzie Hill MRN: [id number] Date of Birth: Jan 25, 1940 Referring Provider: Damaris Hippo, PA   Encounter Date: 01/13/2017  PT End of Session - 01/13/17 1007    Visit Number  13    Number of Visits  32    Date for PT Re-Evaluation  03/10/17    Authorization Type  next will be 1/10    Authorization - Visit Number  3    Authorization - Number of Visits  10    PT Start Time  0930    PT Stop Time  1015    PT Time Calculation (min)  45 min    Equipment Utilized During Treatment  Gait belt    Activity Tolerance  Patient tolerated treatment well;Patient limited by pain;Patient limited by fatigue    Behavior During Therapy  [facility] for tasks assessed/performed       Past Medical History:  Diagnosis Date  . Anemia   . Anemia, unspecified    Hgb 11.8 - 04/2013  . Arthritis   . Cataract    Bilateral  . CHF (congestive heart failure) ([place])   . Chronic kidney disease   . COPD (chronic obstructive pulmonary disease) ([place])   . DDD (degenerative disc disease), lumbar   . Degenerative arthritis   . Diabetes mellitus type 2, uncomplicated (HCC)    AODM (A1C 6.9%) 04/2013  . Diabetes mellitus without complication ([place])   . Dyspnea    with exertion  . Esophagitis   . Gout    Uric acid 5.4 - 08/2012  . Hypertension   . Obesity, unspecified   . Sarcoidosis, lung ([place])    reported by pt, Clinically without a biopsy  . Sleep apnea    OSA--Use C-PAP    Past Surgical History:  Procedure Laterality Date  . ABDOMINAL HYSTERECTOMY     age 85  . COLONOSCOPY  11/02/2005   Hyperplastic Polyp: CBF 10/2015; Recall Ltr mailed 08/27/2015 (dw)  . FOOT SURGERY Right   . HALLUX VALGUS REPAIR Left   . OOPHORECTOMY    . 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  Subjective Assessment - 01/13/17 0936    Subjective  Patient reports feeling extra stiff during new years and now. Reports compliance with HEP, however more stiff in the morning.     Pertinent History  Patient presents to physical therapy evaluation with SPC. Patient had R hip surgery on July 10th and ever since the surgery she has had difficulty bending her R knee. R knee is difficult to move, can't sit up and down without extreme difficulty. Had a cortisone shot in knees in August. 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     Limitations  Sitting;Standing;Walking;House hold activities;Other (comment)    How long can you sit comfortably?  20 minutes    How long can you stand comfortably?  20 minutes    How long can you walk comfortably?  20 minutes    Patient Stated Goals  sit down and get up easier.     Currently in Pain?  No/denies       5x STS:  38 seconds with excessive UE assist 10MWT: .51ms  TUG: 29 seconds LEFS: 30/80   Patient reports feeling more tired and has no energy.      Right Left  Hip flexion 2+/5 4/5  Hip Abduction 3-/5 4/5  Hip Adduction 2+/5 4/5  Knee Extension  3/5 4-/5  Knee Flexion 3-/5 4-/5  DF 3-/5 4/5  PF 3-/5 4/5   Ambulate 3 minutes and 35 seconds : 222 ft with SPC    standing stretches in // bars for R hip flexor 2x 60 seconds (performed bilaterally due to L stiffness)   Standing hip extension with BUE support 12x each leg.   Standing hip flexion with BUE support 12x each leg               PT Education - 01/13/17 1006    Education provided  Yes    Education Details  POC, progression of goals,     Person(s) Educated  Patient    Methods  Explanation;Demonstration;Verbal cues    Comprehension  Verbalized understanding;Returned demonstration        PT Short Term Goals - 01/13/17 0946      PT SHORT TERM GOAL #1   Title  Patient will perform three five consecutive sit to stands in a row without a break demonstrating increased functional strength.     Baseline  requires excessive UE assist for 5     Time  2    Period  Weeks    Status  Partially Met      PT SHORT TERM GOAL #2   Title  Patient will be independent in home exercise program to improve strength/mobility for better functional independence with ADLs.    Baseline  Hep compliance    Time  2    Period  Weeks    Status  Achieved      PT SHORT TERM GOAL #3   Title  Patient will be modified independent in walking on even/uneven surface with least restrictive assistive device, for 10+ minutes without rest break, reporting some difficulty or less to improve walking tolerance with community ambulation including grocery shopping, going to church,etc    Baseline  ambulate 3 minutes and 35 seconds without rest break    Time  2    Period  Weeks    Status  Partially Met        PT Long Term Goals - 01/13/17 07262     PT LONG TERM GOAL #1   Title  Patient (> 89years old) will complete five times sit to stand test in < 15 seconds indicating an increased LE strength and improved balance.    Baseline  11/6: 48 seconds 12/17: 40 seconds 1/2: 38 seconds with excessive UE support     Time  8    Period  Weeks    Status  Partially Met      PT LONG TERM GOAL #2   Title  Patient will increase 10 meter walk test to >1.07m as to improve gait speed for better community ambulation and to reduce fall risk.    Baseline  11/6: .3460m12/17: .45 m/s 1/2: .13m/70m   Time  8    Period  Weeks    Status  Partially Met      PT LONG TERM GOAL #3   Title  Patient will reduce timed up and go to <11 seconds to reduce fall risk and demonstrate improved transfer/gait ability.    Baseline  11/6: 41 seconds 12/17: 33 seconds 1/2: 29 seconds     Time  8    Period  Weeks    Status  Partially Met       PT LONG TERM GOAL #4   Title  Patient will increase lower extremity functional scale to >60/80 to demonstrate improved functional mobility and increased tolerance with ADLs    Baseline  11/6: 48/80  12/17: 59/80     Time  8    Period  Weeks    Status  Partially Met      PT LONG TERM GOAL #5   Title  Patient will increase BLE gross strength to 4+/5 as to improve functional strength for independent gait, increased standing tolerance and increased ADL ability.    Baseline  R gross 3-/5 L 4/5    Time  8    Period  Weeks    Status  New    Target Date  03/10/17            Plan - 01/14/2017 1008    Clinical Impression Statement  Patient demonstrates improved gait speed and transfers as seen in progression towards goals. 5x STS=38 seconds with excessive use of UE's due to weak bilateral hips. 10 MWT= 0.62ms, TUG 29 seconds, and LEFS 30/80. Patient ambulated 3 minutes and 35 seconds without a rest break with SPC. Patient presents with overall weak bilateral hips and decreased mobility of R hip. Patient will continue to benefit from skilled physical therapy for improved strength, posture, and reduced pain.     Clinical Presentation  Evolving    Rehab Potential  Fair    Clinical Impairments Affecting Rehab Potential  (-) R hip sx complications, consistant decline in function and mobility; (+) desire for progress,     PT Frequency  2x / week    PT Duration  8 weeks    PT Treatment/Interventions  ADLs/Self Care Home Management;Aquatic Therapy;Cryotherapy;Electrical Stimulation;Ultrasound;Traction;Moist Heat;Iontophoresis 456mml Dexamethasone;DME Instruction;Gait training;Stair training;Functional mobility training;Therapeutic activities;Therapeutic exercise;Patient/family education;Neuromuscular re-education;Balance training;Manual techniques;Taping;Energy conservation;Passive range of motion    PT Next Visit Plan  hip stretch    PT Home Exercise Plan  see sheet    Consulted and Agree with Plan  of Care  Patient       Patient will benefit from skilled therapeutic intervention in order to improve the following deficits and impairments:  Abnormal gait, Decreased knowledge of use of DME, Decreased coordination, Decreased activity tolerance, Decreased balance, Decreased endurance, Decreased range of motion, Decreased safety awareness, Decreased mobility, Decreased strength, Difficulty walking, Impaired flexibility, Impaired perceived functional ability, Increased muscle spasms, Hypomobility, Postural dysfunction, Improper body mechanics, Pain  Visit Diagnosis: Muscle weakness (generalized)  Stiffness of right knee, not elsewhere classified  Other abnormalities of gait and mobility  Stiffness of right hip, not elsewhere classified   G-Codes - 012019/01/03009    Functional Assessment Tool Used (Outpatient Only)  5xSTS, 10MWT, TUG, LEFS, clinical judgement,     Functional Limitation  Mobility: Walking and moving around    Mobility: Walking and Moving Around Current Status (G(C5885 At least 40 percent but less than 60 percent impaired, limited or restricted    Mobility: Walking and Moving Around Goal Status ([phone] At least 20 percent but less than 40 percent impaired, limited or restricted       Problem List Patient Active Problem List   Diagnosis Date Noted  . COPD (chronic obstructive pulmonary disease) ([place]  . Primary localized osteoarthritis of right hip  07/21/2016   Janna Arch, PT, DPT   Janna Arch 01/13/2017, 11:47 AM  [place] MAIN [facility] SERVICES [address] [place], [place], [address] Phone: [phone]   Fax:  [phone]  Name: Mackenzie Hill MRN: [id number] Date of Birth: February 12, 1940

## 2017-01-15 ENCOUNTER — Ambulatory Visit: Payer: Medicare HMO

## 2017-01-15 DIAGNOSIS — R2689 Other abnormalities of gait and mobility: Secondary | ICD-10-CM

## 2017-01-15 DIAGNOSIS — M25651 Stiffness of right hip, not elsewhere classified: Secondary | ICD-10-CM

## 2017-01-15 DIAGNOSIS — M6281 Muscle weakness (generalized): Secondary | ICD-10-CM

## 2017-01-15 DIAGNOSIS — M25661 Stiffness of right knee, not elsewhere classified: Secondary | ICD-10-CM | POA: Diagnosis not present

## 2017-01-15 NOTE — Therapy (Signed)
[place] MAIN [facility] SERVICES [address] [place], [place], [address] Phone: [phone]   Fax:  [phone]  Physical Therapy Treatment  Patient Details  Name: Mackenzie Hill MRN: [id number] Date of Birth: [birth year] Referring Provider: Damaris Hippo, PA   Encounter Date: 01/15/2017  PT End of Session - 01/15/17 0844    Visit Number  14    Number of Visits  32    Date for PT Re-Evaluation  03/10/17    Authorization - Visit Number  1    Authorization - Number of Visits  10    PT Start Time  0829    PT Stop Time  0914    PT Time Calculation (min)  45 min    Equipment Utilized During Treatment  Gait belt    Activity Tolerance  Patient tolerated treatment well;Patient limited by pain;Patient limited by fatigue    Behavior During Therapy  [facility] for tasks assessed/performed       Past Medical History:  Diagnosis Date  . Anemia   . Anemia, unspecified    Hgb 11.8 - 04/2013  . Arthritis   . Cataract    Bilateral  . CHF (congestive heart failure) ([place])   . Chronic kidney disease   . COPD (chronic obstructive pulmonary disease) ([place])   . DDD (degenerative disc disease), lumbar   . Degenerative arthritis   . Diabetes mellitus type 2, uncomplicated (HCC)    AODM (A1C 6.9%) 04/2013  . Diabetes mellitus without complication ([place])   . Dyspnea    with exertion  . Esophagitis   . Gout    Uric acid 5.4 - 08/2012  . Hypertension   . Obesity, unspecified   . Sarcoidosis, lung ([place])    reported by pt, Clinically without a biopsy  . Sleep apnea    OSA--Use C-PAP    Past Surgical History:  Procedure Laterality Date  . ABDOMINAL HYSTERECTOMY     age 14  . COLONOSCOPY  11/02/2005   Hyperplastic Polyp: CBF 10/2015; Recall Ltr mailed 08/27/2015 (dw)  . FOOT SURGERY Right   . HALLUX VALGUS REPAIR Left   . OOPHORECTOMY    . 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  Subjective Assessment - 01/15/17 0832    Subjective  Patient reports doing hip flexor stretch while at home. Feeling less stiff.     Pertinent History  Patient presents to physical therapy evaluation with SPC. Patient had R hip surgery on July 10th and ever since the surgery she has had difficulty bending her R knee. R knee is difficult to move, can't sit up and down without extreme difficulty. Had a cortisone shot in knees in August. 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     Limitations  Sitting;Standing;Walking;House hold activities;Other (comment)    How long can you sit comfortably?  20 minutes    How long can you stand comfortably?  20 minutes    How long can you walk comfortably?  20 minutes    Patient Stated Goals  sit down and get up easier.     Currently in Pain?  No/denies       Manual Therapy:  R hip SAD 5x30 seconds, lateral and inferior R hip LAD 5x 20 seconds inferior at different  angles   R hip AP grade III-IV mobilizations 3 bouts x 30 seconds each  R hip PA grade II-IV mobilization 5x 20 second bouts Prone R hip flexor stretch 3x 20 seconds    ???????  Therapeutic Exercise:   Sidelying R hip flexor stretch with 30 second holds x2? Clamshells RLE 2x 15x Reverse clamshells RLE 2x 15x  Sit to stand from raised plinth table 10x , rest between each one , only using  Pushing with legs, hands on knees ok. Progressively lowering table to point where patient compensates and excessively weight shifts to L.   Seated adduction squeezes 10x3 second holds Seated LAQ with adduction ball squeeze between ankles 15x.  Seated marching 20x , cues for not leaning back , challenging to RLE.  Pt. response to medical necessity: Patient will continue to benefit from skilled physical therapy for improved strength, posture, and reduced pain.                          PT Education - 01/15/17 0843    Education provided  Yes    Education Details  hip flexor stretching, strengthening and biomechanics of body    Person(s) Educated  Patient    Methods  Explanation;Verbal cues;Demonstration    Comprehension  Verbalized understanding;Returned demonstration       PT Short Term Goals - 01/13/17 0946      PT SHORT TERM GOAL #1   Title  Patient will perform three five consecutive sit to stands in a row without a break demonstrating increased functional strength.     Baseline  requires excessive UE assist for 5     Time  2    Period  Weeks    Status  Partially Met      PT SHORT TERM GOAL #2   Title  Patient will be independent in home exercise program to improve strength/mobility for better functional independence with ADLs.    Baseline  Hep compliance    Time  2    Period  Weeks    Status  Achieved      PT SHORT TERM GOAL #3   Title  Patient will be modified independent in walking on even/uneven surface with least restrictive assistive device, for 10+ minutes without rest break, reporting some difficulty or less to improve walking tolerance with community ambulation including grocery shopping, going to church,etc    Baseline  ambulate 3 minutes and 35 seconds without rest break    Time  2    Period  Weeks    Status  Partially Met        PT Long Term Goals - 01/13/17 9450      PT LONG TERM GOAL #1   Title  Patient (> 54 years old) will complete five times sit to stand test in < 15 seconds indicating an increased LE strength and improved balance.    Baseline  11/6: 48 seconds 12/17: 40 seconds 1/2: 38 seconds with excessive UE support     Time  8    Period  Weeks    Status  Partially Met      PT LONG TERM GOAL #2   Title  Patient will increase 10 meter walk test to >1.53ms as to improve gait speed for better community ambulation and to reduce fall risk.    Baseline  11/6: .342m 12/17: .45 m/s 1/2:  .72m472m    Time  8    Period  Weeks    Status  Partially Met      PT LONG TERM GOAL #3   Title  Patient will reduce timed up and go to <11 seconds to reduce fall risk and demonstrate improved transfer/gait ability.    Baseline  11/6: 41 seconds 12/17: 33 seconds 1/2: 29 seconds     Time  8    Period  Weeks    Status  Partially Met      PT LONG TERM GOAL #4   Title  Patient will increase lower extremity functional scale to >60/80 to demonstrate improved functional mobility and increased tolerance with ADLs    Baseline  11/6: 48/80  12/17: 59/80     Time  8    Period  Weeks    Status  Partially Met      PT LONG TERM GOAL #5   Title  Patient will increase BLE gross strength to 4+/5 as to improve functional strength for independent gait, increased standing tolerance and increased ADL ability.    Baseline  R gross 3-/5 L 4/5    Time  8    Period  Weeks    Status  New    Target Date  03/10/17            Plan - 01/15/17 6546    Clinical Impression Statement  Patient performed sit to stand intervention with focus on equal weight shift throughout transfer. Required putting R foot slightly inferior to left to allow for equal weight shift due to preference to weight shift with LLE. Patient continues to have limited R hip extension due to bony alignment and muscle tightness. Patient will continue to benefit from skilled physical therapy for improved strength, posture, and reduced pain.     Rehab Potential  Fair    Clinical Impairments Affecting Rehab Potential  (-) R hip sx complications, consistant decline in function and mobility; (+) desire for progress,     PT Frequency  2x / week    PT Duration  8 weeks    PT Treatment/Interventions  ADLs/Self Care Home Management;Aquatic Therapy;Cryotherapy;Electrical Stimulation;Ultrasound;Traction;Moist Heat;Iontophoresis 106m/ml Dexamethasone;DME Instruction;Gait training;Stair training;Functional mobility training;Therapeutic  activities;Therapeutic exercise;Patient/family education;Neuromuscular re-education;Balance training;Manual techniques;Taping;Energy conservation;Passive range of motion    PT Next Visit Plan  hip stretch    PT Home Exercise Plan  see sheet    Consulted and Agree with Plan of Care  Patient       Patient will benefit from skilled therapeutic intervention in order to improve the following deficits and impairments:  Abnormal gait, Decreased knowledge of use of DME, Decreased coordination, Decreased activity tolerance, Decreased balance, Decreased endurance, Decreased range of motion, Decreased safety awareness, Decreased mobility, Decreased strength, Difficulty walking, Impaired flexibility, Impaired perceived functional ability, Increased muscle spasms, Hypomobility, Postural dysfunction, Improper body mechanics, Pain  Visit Diagnosis: Muscle weakness (generalized)  Stiffness of right knee, not elsewhere classified  Other abnormalities of gait and mobility  Stiffness of right hip, not elsewhere classified     Problem List Patient Active Problem List   Diagnosis Date Noted  . COPD (chronic obstructive pulmonary disease) ([place] 08/11/2016  . Primary localized osteoarthritis of right hip 07/21/2016   MJanna Arch PT, DPT   MJanna Arch1/04/2017, 9:13 AM  [place] [facility] [address] [address]Phone: [phone]  Fax:  [phone] Name: Mackenzie RUFFALOMRN: [id number] of Birth: 109-Dec-1942

## 2017-01-19 ENCOUNTER — Ambulatory Visit: Payer: Medicare HMO

## 2017-01-19 DIAGNOSIS — M25651 Stiffness of right hip, not elsewhere classified: Secondary | ICD-10-CM | POA: Diagnosis not present

## 2017-01-19 DIAGNOSIS — M6281 Muscle weakness (generalized): Secondary | ICD-10-CM | POA: Diagnosis not present

## 2017-01-19 DIAGNOSIS — R2689 Other abnormalities of gait and mobility: Secondary | ICD-10-CM | POA: Diagnosis not present

## 2017-01-19 DIAGNOSIS — M25661 Stiffness of right knee, not elsewhere classified: Secondary | ICD-10-CM

## 2017-01-19 NOTE — Therapy (Signed)
[place] MAIN [facility] SERVICES [address] [place], [place], [address] Phone: [phone]   Fax:  [phone]  Physical Therapy Treatment  Patient Details  Name: Mackenzie Hill MRN: [id number] Date of Birth: [birth year] Referring Provider: Damaris Hippo, PA   Encounter Date: 01/19/2017  PT End of Session - 01/19/17 0826    Visit Number  15    Number of Visits  32    Date for PT Re-Evaluation  03/10/17    Authorization - Visit Number  2    Authorization - Number of Visits  10    PT Start Time  0820    PT Stop Time  0906    PT Time Calculation (min)  46 min    Equipment Utilized During Treatment  Gait belt    Activity Tolerance  Patient tolerated treatment well;Patient limited by pain;Patient limited by fatigue    Behavior During Therapy  [facility] for tasks assessed/performed       Past Medical History:  Diagnosis Date  . Anemia   . Anemia, unspecified    Hgb 11.8 - 04/2013  . Arthritis   . Cataract    Bilateral  . CHF (congestive heart failure) ([place])   . Chronic kidney disease   . COPD (chronic obstructive pulmonary disease) ([place])   . DDD (degenerative disc disease), lumbar   . Degenerative arthritis   . Diabetes mellitus type 2, uncomplicated (HCC)    AODM (A1C 6.9%) 04/2013  . Diabetes mellitus without complication ([place])   . Dyspnea    with exertion  . Esophagitis   . Gout    Uric acid 5.4 - 08/2012  . Hypertension   . Obesity, unspecified   . Sarcoidosis, lung ([place])    reported by pt, Clinically without a biopsy  . Sleep apnea    OSA--Use C-PAP    Past Surgical History:  Procedure Laterality Date  . ABDOMINAL HYSTERECTOMY     age 77  . COLONOSCOPY  11/02/2005   Hyperplastic Polyp: CBF 10/2015; Recall Ltr mailed 08/27/2015 (dw)  . FOOT SURGERY Right   . HALLUX VALGUS REPAIR Left   . OOPHORECTOMY    . 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  Subjective Assessment - 01/19/17 0825    Subjective  Patient reports compliance with HEP and stretches. Reports performing sit to stands from raised bed practicing putting weight through bilateral legs.     Pertinent History  Patient presents to physical therapy evaluation with SPC. Patient had R hip surgery on July 10th and ever since the surgery she has had difficulty bending her R knee. R knee is difficult to move, can't sit up and down without extreme difficulty. Had a cortisone shot in knees in August. 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     Limitations  Sitting;Standing;Walking;House hold activities;Other (comment)    How long can you sit comfortably?  20 minutes    How long can you stand comfortably?  20 minutes    How long can you walk comfortably?  20 minutes    Patient Stated Goals  sit down and get up easier.     Currently in Pain?  No/denies      Nustep lvl 1 4 minutes  Manual Therapy:  R hip SAD  5x30 seconds, lateral and inferior R hip LAD 5x 20 seconds inferior at different angles   R hip PA grade II-IV mobilization 5x 20 second bouts Prone R hip flexor stretch 3x 20 seconds guiding to correct excessive external rotation compensatory pattern.      ???????  Therapeutic Exercise:  Prone hamstring curls with PT guiding to prevent  Excessive ER. 10x each leg  Sidelying R hip flexor and IT band stretch with 30 second holds x2? Clamshells RLE  15x Reverse clamshells RLE  15x  Supine bridges 10x  With PT providing support to knees  Seated df and pf on rockerboard with PT providing support to R knee to prevent hip external rotation. 15x.    Seated adduction squeezes 12x3 second holds Seated LAQ with adduction ball squeeze between ankles 15x.  Seated marching 20x , cues for not leaning back , challenging to RLE.   Pt. response to  medical necessity: Patient will continue to benefit from skilled physical therapy for improved strength, posture, and reduced pain.                         PT Education - 01/19/17 0825    Education provided  Yes    Education Details  hip flexor stretch, strengthening of bilateral LEs.     Person(s) Educated  Patient    Methods  Explanation;Demonstration;Verbal cues    Comprehension  Verbalized understanding;Returned demonstration       PT Short Term Goals - 01/13/17 0946      PT SHORT TERM GOAL #1   Title  Patient will perform three five consecutive sit to stands in a row without a break demonstrating increased functional strength.     Baseline  requires excessive UE assist for 5     Time  2    Period  Weeks    Status  Partially Met      PT SHORT TERM GOAL #2   Title  Patient will be independent in home exercise program to improve strength/mobility for better functional independence with ADLs.    Baseline  Hep compliance    Time  2    Period  Weeks    Status  Achieved      PT SHORT TERM GOAL #3   Title  Patient will be modified independent in walking on even/uneven surface with least restrictive assistive device, for 10+ minutes without rest break, reporting some difficulty or less to improve walking tolerance with community ambulation including grocery shopping, going to church,etc    Baseline  ambulate 3 minutes and 35 seconds without rest break    Time  2    Period  Weeks    Status  Partially Met        PT Long Term Goals - 01/13/17 6503      PT LONG TERM GOAL #1   Title  Patient (> 25 years old) will complete five times sit to stand test in < 15 seconds indicating an increased LE strength and improved balance.    Baseline  11/6: 48 seconds 12/17: 40 seconds 1/2: 38 seconds with excessive UE support     Time  8    Period  Weeks    Status  Partially Met      PT LONG TERM GOAL #2   Title  Patient will increase 10 meter walk test to >1.35ms as to  improve gait speed for better community ambulation and to reduce fall risk.  Baseline  11/6: .49ms 12/17: .45 m/s 1/2: .559m     Time  8    Period  Weeks    Status  Partially Met      PT LONG TERM GOAL #3   Title  Patient will reduce timed up and go to <11 seconds to reduce fall risk and demonstrate improved transfer/gait ability.    Baseline  11/6: 41 seconds 12/17: 33 seconds 1/2: 29 seconds     Time  8    Period  Weeks    Status  Partially Met      PT LONG TERM GOAL #4   Title  Patient will increase lower extremity functional scale to >60/80 to demonstrate improved functional mobility and increased tolerance with ADLs    Baseline  11/6: 48/80  12/17: 59/80     Time  8    Period  Weeks    Status  Partially Met      PT LONG TERM GOAL #5   Title  Patient will increase BLE gross strength to 4+/5 as to improve functional strength for independent gait, increased standing tolerance and increased ADL ability.    Baseline  R gross 3-/5 L 4/5    Time  8    Period  Weeks    Status  New    Target Date  03/10/17            Plan - 01/19/17 0840    Clinical Impression Statement  Patient demonstrating preference for external rotation and hip flexion of R hip limiting ability to perform extension based interventions. Patient extensors weak and performed strengthening interventions with encouragement. Patient will continue to benefit from skilled physical therapy for improved strength, posture, and reduced pain.     Rehab Potential  Fair    Clinical Impairments Affecting Rehab Potential  (-) R hip sx complications, consistant decline in function and mobility; (+) desire for progress,     PT Frequency  2x / week    PT Duration  8 weeks    PT Treatment/Interventions  ADLs/Self Care Home Management;Aquatic Therapy;Cryotherapy;Electrical Stimulation;Ultrasound;Traction;Moist Heat;Iontophoresis 72m42ml Dexamethasone;DME Instruction;Gait training;Stair training;Functional mobility  training;Therapeutic activities;Therapeutic exercise;Patient/family education;Neuromuscular re-education;Balance training;Manual techniques;Taping;Energy conservation;Passive range of motion    PT Next Visit Plan  hip stretch    PT Home Exercise Plan  see sheet    Consulted and Agree with Plan of Care  Patient       Patient will benefit from skilled therapeutic intervention in order to improve the following deficits and impairments:  Abnormal gait, Decreased knowledge of use of DME, Decreased coordination, Decreased activity tolerance, Decreased balance, Decreased endurance, Decreased range of motion, Decreased safety awareness, Decreased mobility, Decreased strength, Difficulty walking, Impaired flexibility, Impaired perceived functional ability, Increased muscle spasms, Hypomobility, Postural dysfunction, Improper body mechanics, Pain  Visit Diagnosis: Muscle weakness (generalized)  Stiffness of right knee, not elsewhere classified  Other abnormalities of gait and mobility  Stiffness of right hip, not elsewhere classified     Problem List Patient Active Problem List   Diagnosis Date Noted  . COPD (chronic obstructive pulmonary disease) ([place]CC[place]7/31/2018  . Primary localized osteoarthritis of right hip 07/21/2016   MarJanna ArchT, DPT   MarJanna Arch8/2019, 9:09 AM  ConMoberlyIN RE[facility]ead[facility] CenterRVICES [address] [place]one: [phone]:  [phone]: AnnPREZLEY Hill: [phone] of Birth: 1/109/12/1940

## 2017-01-21 ENCOUNTER — Ambulatory Visit: Payer: Medicare HMO

## 2017-01-21 DIAGNOSIS — M25661 Stiffness of right knee, not elsewhere classified: Secondary | ICD-10-CM

## 2017-01-21 DIAGNOSIS — M25651 Stiffness of right hip, not elsewhere classified: Secondary | ICD-10-CM | POA: Diagnosis not present

## 2017-01-21 DIAGNOSIS — M6281 Muscle weakness (generalized): Secondary | ICD-10-CM | POA: Diagnosis not present

## 2017-01-21 DIAGNOSIS — R2689 Other abnormalities of gait and mobility: Secondary | ICD-10-CM

## 2017-01-21 NOTE — Therapy (Signed)
[place] MAIN [facility] SERVICES [address] [place], [place], [address] Phone: [phone]   Fax:  [phone]  Physical Therapy Treatment  Patient Details  Name: Mackenzie Hill MRN: [id number] Date of Birth: Jan 19, 1940 Referring Provider: Damaris Hippo, PA   Encounter Date: 01/21/2017  PT End of Session - 01/21/17 0850    Visit Number  16    Number of Visits  32    Date for PT Re-Evaluation  03/10/17    Authorization - Visit Number  3    Authorization - Number of Visits  10    PT Start Time  0845    PT Stop Time  0930    PT Time Calculation (min)  45 min    Equipment Utilized During Treatment  Gait belt    Activity Tolerance  Patient tolerated treatment well;Patient limited by pain;Patient limited by fatigue    Behavior During Therapy  [facility] for tasks assessed/performed       Past Medical History:  Diagnosis Date  . Anemia   . Anemia, unspecified    Hgb 11.8 - 04/2013  . Arthritis   . Cataract    Bilateral  . CHF (congestive heart failure) ([place])   . Chronic kidney disease   . COPD (chronic obstructive pulmonary disease) ([place])   . DDD (degenerative disc disease), lumbar   . Degenerative arthritis   . Diabetes mellitus type 2, uncomplicated (HCC)    AODM (A1C 6.9%) 04/2013  . Diabetes mellitus without complication ([place])   . Dyspnea    with exertion  . Esophagitis   . Gout    Uric acid 5.4 - 08/2012  . Hypertension   . Obesity, unspecified   . Sarcoidosis, lung ([place])    reported by pt, Clinically without a biopsy  . Sleep apnea    OSA--Use C-PAP    Past Surgical History:  Procedure Laterality Date  . ABDOMINAL HYSTERECTOMY     age 77  . COLONOSCOPY  11/02/2005   Hyperplastic Polyp: CBF 10/2015; Recall Ltr mailed 08/27/2015 (dw)  . FOOT SURGERY Right   . HALLUX VALGUS REPAIR Left   . OOPHORECTOMY    . 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  Subjective Assessment - 01/21/17 0849    Subjective  Patient reports feeling stiff this morning due to the weather. Feeling fatigued but not painful, Reports compliance with HEP.     Pertinent History  Patient presents to physical therapy evaluation with SPC. Patient had R hip surgery on July 10th and ever since the surgery she has had difficulty bending her R knee. R knee is difficult to move, can't sit up and down without extreme difficulty. Had a cortisone shot in knees in August. 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     Limitations  Sitting;Standing;Walking;House hold activities;Other (comment)    How long can you sit comfortably?  20 minutes    How long can you stand comfortably?  20 minutes    How long can you walk comfortably?  20 minutes    Patient Stated Goals  sit down and get up easier.     Currently in Pain?  No/denies      Nustep lvl 1 4 minutes  Manual Therapy:  R hip SAD 5x30 seconds,  lateral and inferior R hip LAD 5x 20 seconds inferior at different angles   R hip PA grade II-IV mobilization 5x 20 second bouts Prone R hip flexor stretch 4x 30 seconds guiding to correct excessive external rotation compensatory pattern.      ???????  Therapeutic Exercise:  Bed mobility rolling prone to supine bilaterally, Min A for supine to prone.   Prone hamstring curls with PT guiding to prevent  Excessive ER. 10x each leg  Supine adduction squeezing ball  2x 10x 5 seconds   Supine bridges 2x 10x  With PT providing support to knees   Supine heel slides (BLE) 2x 10x    Sit to stand from raised.  EOB with focus on promoting equal weight shift between BLE.    Pt. response to medical necessity: Patient will continue to benefit from skilled physical therapy for improved strength, posture, and reduced  pain                           PT Short Term Goals - 01/13/17 0946      PT SHORT TERM GOAL #1   Title  Patient will perform three five consecutive sit to stands in a row without a break demonstrating increased functional strength.     Baseline  requires excessive UE assist for 5     Time  2    Period  Weeks    Status  Partially Met      PT SHORT TERM GOAL #2   Title  Patient will be independent in home exercise program to improve strength/mobility for better functional independence with ADLs.    Baseline  Hep compliance    Time  2    Period  Weeks    Status  Achieved      PT SHORT TERM GOAL #3   Title  Patient will be modified independent in walking on even/uneven surface with least restrictive assistive device, for 10+ minutes without rest break, reporting some difficulty or less to improve walking tolerance with community ambulation including grocery shopping, going to church,etc    Baseline  ambulate 3 minutes and 35 seconds without rest break    Time  2    Period  Weeks    Status  Partially Met        PT Long Term Goals - 01/13/17 0973      PT LONG TERM GOAL #1   Title  Patient (> 4 years old) will complete five times sit to stand test in < 15 seconds indicating an increased LE strength and improved balance.    Baseline  11/6: 48 seconds 12/17: 40 seconds 1/2: 38 seconds with excessive UE support     Time  8    Period  Weeks    Status  Partially Met      PT LONG TERM GOAL #2   Title  Patient will increase 10 meter walk test to >1.52ms as to improve gait speed for better community ambulation and to reduce fall risk.    Baseline  11/6: .392m 12/17: .45 m/s 1/2: .62m1m    Time  8    Period  Weeks    Status  Partially Met      PT LONG TERM GOAL #3   Title  Patient will reduce timed up and go to <11 seconds to reduce fall risk and demonstrate improved transfer/gait ability.    Baseline  11/6: 41 seconds 12/17: 33 seconds 1/2:  29 seconds      Time  8    Period  Weeks    Status  Partially Met      PT LONG TERM GOAL #4   Title  Patient will increase lower extremity functional scale to >60/80 to demonstrate improved functional mobility and increased tolerance with ADLs    Baseline  11/6: 48/80  12/17: 59/80     Time  8    Period  Weeks    Status  Partially Met      PT LONG TERM GOAL #5   Title  Patient will increase BLE gross strength to 4+/5 as to improve functional strength for independent gait, increased standing tolerance and increased ADL ability.    Baseline  R gross 3-/5 L 4/5    Time  8    Period  Weeks    Status  New    Target Date  03/10/17            Plan - 01/21/17 0920    Clinical Impression Statement  Patient challenged by bed mobility this session due to increased stiffness of totally body. Supine interventions implemented to increase bed mobility on days that patient is having increased stiffness. Patient challenged by maintaining neutral alignment of R hip, requiring Min to Mod A to maintain during active movement. Patient will continue to benefit from skilled physical therapy for improved strength, posture, and reduced pain    Rehab Potential  Fair    Clinical Impairments Affecting Rehab Potential  (-) R hip sx complications, consistant decline in function and mobility; (+) desire for progress,     PT Frequency  2x / week    PT Duration  8 weeks    PT Treatment/Interventions  ADLs/Self Care Home Management;Aquatic Therapy;Cryotherapy;Electrical Stimulation;Ultrasound;Traction;Moist Heat;Iontophoresis 22m/ml Dexamethasone;DME Instruction;Gait training;Stair training;Functional mobility training;Therapeutic activities;Therapeutic exercise;Patient/family education;Neuromuscular re-education;Balance training;Manual techniques;Taping;Energy conservation;Passive range of motion    PT Next Visit Plan  hip stretch    PT Home Exercise Plan  see sheet    Consulted and Agree with Plan of Care  Patient        Patient will benefit from skilled therapeutic intervention in order to improve the following deficits and impairments:  Abnormal gait, Decreased knowledge of use of DME, Decreased coordination, Decreased activity tolerance, Decreased balance, Decreased endurance, Decreased range of motion, Decreased safety awareness, Decreased mobility, Decreased strength, Difficulty walking, Impaired flexibility, Impaired perceived functional ability, Increased muscle spasms, Hypomobility, Postural dysfunction, Improper body mechanics, Pain  Visit Diagnosis: Muscle weakness (generalized)  Stiffness of right knee, not elsewhere classified  Other abnormalities of gait and mobility  Stiffness of right hip, not elsewhere classified     Problem List Patient Active Problem List   Diagnosis Date Noted  . COPD (chronic obstructive pulmonary disease) ([place] 08/11/2016  . Primary localized osteoarthritis of right hip 07/21/2016   MJanna Arch PT, DPT   MJanna Arch1/10/2017, 9:30 AM  CDivideMAIN RKiowa County Memorial HospitalSERVICES [address]ROgden N[place] [address]Phone: [phone]  Fax:  [phone] Name: Mackenzie MIJANGOSMRN: [id number] of Birth: 1Oct 08, 1942

## 2017-01-26 ENCOUNTER — Ambulatory Visit: Payer: Medicare HMO

## 2017-01-26 DIAGNOSIS — M6281 Muscle weakness (generalized): Secondary | ICD-10-CM

## 2017-01-26 DIAGNOSIS — M25651 Stiffness of right hip, not elsewhere classified: Secondary | ICD-10-CM

## 2017-01-26 DIAGNOSIS — M25661 Stiffness of right knee, not elsewhere classified: Secondary | ICD-10-CM

## 2017-01-26 DIAGNOSIS — R2689 Other abnormalities of gait and mobility: Secondary | ICD-10-CM

## 2017-01-26 NOTE — Therapy (Signed)
[place] MAIN [facility] SERVICES [address] [place], [place], [address] Phone: [phone]   Fax:  [phone]  Physical Therapy Treatment  Patient Details  Name: Mackenzie Hill MRN: [id number] Date of Birth: 17-Sep-1940 Referring Provider: Damaris Hippo, PA   Encounter Date: 01/26/2017  PT End of Session - 01/26/17 0835    Visit Number  17    Number of Visits  32    Date for PT Re-Evaluation  03/10/17    PT Start Time  0829    PT Stop Time  0915    PT Time Calculation (min)  46 min    Equipment Utilized During Treatment  Gait belt    Activity Tolerance  Patient tolerated treatment well;Patient limited by pain;Patient limited by fatigue    Behavior During Therapy  [facility] for tasks assessed/performed       Past Medical History:  Diagnosis Date  . Anemia   . Anemia, unspecified    Hgb 11.8 - 04/2013  . Arthritis   . Cataract    Bilateral  . CHF (congestive heart failure) ([place])   . Chronic kidney disease   . COPD (chronic obstructive pulmonary disease) ([place])   . DDD (degenerative disc disease), lumbar   . Degenerative arthritis   . Diabetes mellitus type 2, uncomplicated (HCC)    AODM (A1C 6.9%) 04/2013  . Diabetes mellitus without complication ([place])   . Dyspnea    with exertion  . Esophagitis   . Gout    Uric acid 5.4 - 08/2012  . Hypertension   . Obesity, unspecified   . Sarcoidosis, lung ([place])    reported by pt, Clinically without a biopsy  . Sleep apnea    OSA--Use C-PAP    Past Surgical History:  Procedure Laterality Date  . ABDOMINAL HYSTERECTOMY     age 21  . COLONOSCOPY  11/02/2005   Hyperplastic Polyp: CBF 10/2015; Recall Ltr mailed 08/27/2015 (dw)  . FOOT SURGERY Right   . HALLUX VALGUS REPAIR Left   . OOPHORECTOMY    . 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  Subjective Assessment - 01/26/17 0834    Subjective  Patient was fatigued after last session, falling asleep after returning home. Feeling stiff but not as bad as previous days.     Pertinent History  Patient presents to physical therapy evaluation with SPC. Patient had R hip surgery on July 10th and ever since the surgery she has had difficulty bending her R knee. R knee is difficult to move, can't sit up and down without extreme difficulty. Had a cortisone shot in knees in August. 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     Limitations  Sitting;Standing;Walking;House hold activities;Other (comment)    How long can you sit comfortably?  20 minutes    How long can you stand comfortably?  20 minutes    How long can you walk comfortably?  20 minutes    Patient Stated Goals  sit down and get up easier.     Currently in Pain?  No/denies       Nustep lvl 1 4 minutes   Manual Therapy:  R hip SAD 5x30 seconds, lateral and inferior R hip LAD 5x 20 seconds inferior at different angles   R  hip PA grade II-IV mobilization 5x 20 second bouts Prone R hip flexor stretch 4x 30 seconds guiding to correct excessive external rotation compensatory pattern.      ???????  Therapeutic Exercise:  Bed mobility rolling prone to supine bilaterally, Min A for supine to prone.    Prone hamstring curls with PT guiding to prevent  Excessive ER. 10x each leg  Supine donkey kicks 12x each leg    Supine adduction squeezing ball  2x 10x 5 seconds    Supine bridges 2x 10x  With PT providing support to knees    Supine heel slides (BLE) 2x 10x    Sit to stand from raised.  EOB with focus on promoting equal weight shift between BLE.    Supine hamstring curls with swiss ball 10x   Seated adduction squeezes 2x 10x 3 second holds   Seated adduction squeezing ball between ankles with LAQ 2x10    Pt. response to medical necessity: Patient will  continue to benefit from skilled physical therapy for improved strength, posture, and reduced pain                         PT Education - 01/26/17 0834    Education provided  Yes    Education Details  hip mobility, strengthening of bilateral LEs    Person(s) Educated  Patient    Methods  Explanation;Demonstration;Verbal cues    Comprehension  Verbalized understanding;Returned demonstration       PT Short Term Goals - 01/13/17 0946      PT SHORT TERM GOAL #1   Title  Patient will perform three five consecutive sit to stands in a row without a break demonstrating increased functional strength.     Baseline  requires excessive UE assist for 5     Time  2    Period  Weeks    Status  Partially Met      PT SHORT TERM GOAL #2   Title  Patient will be independent in home exercise program to improve strength/mobility for better functional independence with ADLs.    Baseline  Hep compliance    Time  2    Period  Weeks    Status  Achieved      PT SHORT TERM GOAL #3   Title  Patient will be modified independent in walking on even/uneven surface with least restrictive assistive device, for 10+ minutes without rest break, reporting some difficulty or less to improve walking tolerance with community ambulation including grocery shopping, going to church,etc    Baseline  ambulate 3 minutes and 35 seconds without rest break    Time  2    Period  Weeks    Status  Partially Met        PT Long Term Goals - 01/13/17 1751      PT LONG TERM GOAL #1   Title  Patient (> 42 years old) will complete five times sit to stand test in < 15 seconds indicating an increased LE strength and improved balance.    Baseline  11/6: 48 seconds 12/17: 40 seconds 1/2: 38 seconds with excessive UE support     Time  8    Period  Weeks    Status  Partially Met      PT LONG TERM GOAL #2   Title  Patient will increase 10 meter walk test to >1.25ms as to improve gait speed for better community  ambulation and to reduce fall  risk.    Baseline  11/6: .109ms 12/17: .45 m/s 1/2: .571m     Time  8    Period  Weeks    Status  Partially Met      PT LONG TERM GOAL #3   Title  Patient will reduce timed up and go to <11 seconds to reduce fall risk and demonstrate improved transfer/gait ability.    Baseline  11/6: 41 seconds 12/17: 33 seconds 1/2: 29 seconds     Time  8    Period  Weeks    Status  Partially Met      PT LONG TERM GOAL #4   Title  Patient will increase lower extremity functional scale to >60/80 to demonstrate improved functional mobility and increased tolerance with ADLs    Baseline  11/6: 48/80  12/17: 59/80     Time  8    Period  Weeks    Status  Partially Met      PT LONG TERM GOAL #5   Title  Patient will increase BLE gross strength to 4+/5 as to improve functional strength for independent gait, increased standing tolerance and increased ADL ability.    Baseline  R gross 3-/5 L 4/5    Time  8    Period  Weeks    Status  New    Target Date  03/10/17            Plan - 01/26/17 0856    Clinical Impression Statement  Patient improving with posterior musculature strength, continues to require assistance to decrease compensatory external rotation due to bony alignment. Patient improving with supine and prone strengthening interventions with improved range and quality of motion. Patient will continue to benefit from skilled physical therapy for improved strength, posture, and reduced pain    Rehab Potential  Fair    Clinical Impairments Affecting Rehab Potential  (-) R hip sx complications, consistant decline in function and mobility; (+) desire for progress,     PT Frequency  2x / week    PT Duration  8 weeks    PT Treatment/Interventions  ADLs/Self Care Home Management;Aquatic Therapy;Cryotherapy;Electrical Stimulation;Ultrasound;Traction;Moist Heat;Iontophoresis 55m51ml Dexamethasone;DME Instruction;Gait training;Stair training;Functional mobility  training;Therapeutic activities;Therapeutic exercise;Patient/family education;Neuromuscular re-education;Balance training;Manual techniques;Taping;Energy conservation;Passive range of motion    PT Next Visit Plan  hip stretch    PT Home Exercise Plan  see sheet    Consulted and Agree with Plan of Care  Patient       Patient will benefit from skilled therapeutic intervention in order to improve the following deficits and impairments:  Abnormal gait, Decreased knowledge of use of DME, Decreased coordination, Decreased activity tolerance, Decreased balance, Decreased endurance, Decreased range of motion, Decreased safety awareness, Decreased mobility, Decreased strength, Difficulty walking, Impaired flexibility, Impaired perceived functional ability, Increased muscle spasms, Hypomobility, Postural dysfunction, Improper body mechanics, Pain  Visit Diagnosis: Muscle weakness (generalized)  Stiffness of right knee, not elsewhere classified  Other abnormalities of gait and mobility  Stiffness of right hip, not elsewhere classified     Problem List Patient Active Problem List   Diagnosis Date Noted  . COPD (chronic obstructive pulmonary disease) ([place]/31/2018  . Primary localized osteoarthritis of right hip 07/21/2016   MarJanna ArchT, DPT    MarJanna Arch15/2019, 9:18 AM  [facility] [address] [place]one: [phone]:  [phone]: AnnSHAKYA SEBRINGN: [id number] of Birth: [birth year]

## 2017-01-28 ENCOUNTER — Ambulatory Visit: Payer: Medicare HMO

## 2017-01-28 DIAGNOSIS — M6281 Muscle weakness (generalized): Secondary | ICD-10-CM | POA: Diagnosis not present

## 2017-01-28 DIAGNOSIS — M25661 Stiffness of right knee, not elsewhere classified: Secondary | ICD-10-CM

## 2017-01-28 DIAGNOSIS — R2689 Other abnormalities of gait and mobility: Secondary | ICD-10-CM

## 2017-01-28 DIAGNOSIS — M25651 Stiffness of right hip, not elsewhere classified: Secondary | ICD-10-CM | POA: Diagnosis not present

## 2017-01-28 NOTE — Therapy (Signed)
[place] MAIN [facility] SERVICES [address] [place], [place], [address] Phone: [phone]   Fax:  [phone]  Physical Therapy Treatment  Patient Details  Name: Mackenzie Hill MRN: [id number] Date of Birth: November 09, 1940 Referring Provider: Damaris Hippo, PA   Encounter Date: 01/28/2017  PT End of Session - 01/28/17 0851    Visit Number  18    Number of Visits  32    Date for PT Re-Evaluation  03/10/17    PT Start Time  0845    PT Stop Time  0930    PT Time Calculation (min)  45 min    Equipment Utilized During Treatment  Gait belt    Activity Tolerance  Patient tolerated treatment well;Patient limited by pain;Patient limited by fatigue    Behavior During Therapy  [facility] for tasks assessed/performed       Past Medical History:  Diagnosis Date  . Anemia   . Anemia, unspecified    Hgb 11.8 - 04/2013  . Arthritis   . Cataract    Bilateral  . CHF (congestive heart failure) ([place])   . Chronic kidney disease   . COPD (chronic obstructive pulmonary disease) ([place])   . DDD (degenerative disc disease), lumbar   . Degenerative arthritis   . Diabetes mellitus type 2, uncomplicated (HCC)    AODM (A1C 6.9%) 04/2013  . Diabetes mellitus without complication ([place])   . Dyspnea    with exertion  . Esophagitis   . Gout    Uric acid 5.4 - 08/2012  . Hypertension   . Obesity, unspecified   . Sarcoidosis, lung ([place])    reported by pt, Clinically without a biopsy  . Sleep apnea    OSA--Use C-PAP    Past Surgical History:  Procedure Laterality Date  . ABDOMINAL HYSTERECTOMY     age 77  . COLONOSCOPY  11/02/2005   Hyperplastic Polyp: CBF 10/2015; Recall Ltr mailed 08/27/2015 (dw)  . FOOT SURGERY Right   . HALLUX VALGUS REPAIR Left   . OOPHORECTOMY    . 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  Subjective Assessment - 01/28/17 0850    Subjective  Patient feeling fatigued this morning, "cannot wake up", no stumbles or falls reported. Reports doing her exercises     Pertinent History  Patient presents to physical therapy evaluation with SPC. Patient had R hip surgery on July 10th and ever since the surgery she has had difficulty bending her R knee. R knee is difficult to move, can't sit up and down without extreme difficulty. Had a cortisone shot in knees in August. 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     Limitations  Sitting;Standing;Walking;House hold activities;Other (comment)    How long can you sit comfortably?  20 minutes    How long can you stand comfortably?  20 minutes    How long can you walk comfortably?  20 minutes    Patient Stated Goals  sit down and get up easier.     Currently in Pain?  No/denies       Nustep lvl 1 4 minutes, seat 11    Manual Therapy:  R hip SAD 5x30 seconds, lateral and inferior R hip LAD 5x 20 seconds inferior at different angles   R  hip PA grade II-IV mobilization 5x 20 second bouts Prone R hip flexor stretch 4x 30 seconds guiding to correct excessive external rotation compensatory pattern.      ???????  Therapeutic Exercise:   Standing lunge hip flexor stretch 2x 30 seconds each leg (BLE)  Bed mobility rolling prone to supine bilaterally, Min A for supine to prone.    Prone hamstring curls with PT guiding to prevent  R Excessive ER. 10x each leg   Supine donkey kicks 12x each leg    Supine adduction squeezing ball  2x 10x 5 seconds    Supine bridges 2x 10x  With PT providing support to knees    Supine heel slides (BLE) 2x 10x , excessive ER with RLE    Sit to stand from raised.  EOB with focus on promoting equal weight shift between BLE.    Supine hamstring curls with swiss ball 10x    Seated adduction squeezes 2x 10x 3 second holds   Resisted hamstring curl  RTB 2x10x each leg   Pt. response to medical necessity: Patient will continue to benefit from skilled physical therapy for improved strength, posture, and reduced pain                           PT Education - 01/28/17 0851    Education provided  Yes    Education Details  R hip mobility, strenghtening of bilateral LE's    Person(s) Educated  Patient    Methods  Explanation;Demonstration;Verbal cues    Comprehension  Verbalized understanding;Returned demonstration;Verbal cues required       PT Short Term Goals - 01/13/17 0946      PT SHORT TERM GOAL #1   Title  Patient will perform three five consecutive sit to stands in a row without a break demonstrating increased functional strength.     Baseline  requires excessive UE assist for 5     Time  2    Period  Weeks    Status  Partially Met      PT SHORT TERM GOAL #2   Title  Patient will be independent in home exercise program to improve strength/mobility for better functional independence with ADLs.    Baseline  Hep compliance    Time  2    Period  Weeks    Status  Achieved      PT SHORT TERM GOAL #3   Title  Patient will be modified independent in walking on even/uneven surface with least restrictive assistive device, for 10+ minutes without rest break, reporting some difficulty or less to improve walking tolerance with community ambulation including grocery shopping, going to church,etc    Baseline  ambulate 3 minutes and 35 seconds without rest break    Time  2    Period  Weeks    Status  Partially Met        PT Long Term Goals - 01/13/17 7793      PT LONG TERM GOAL #1   Title  Patient (> 37 years old) will complete five times sit to stand test in < 15 seconds indicating an increased LE strength and improved balance.    Baseline  11/6: 48 seconds 12/17: 40 seconds 1/2: 38 seconds with excessive UE support     Time  8    Period  Weeks    Status  Partially Met      PT LONG TERM GOAL #2   Title  Patient will increase 10 meter walk test to >1.879ms as to improve gait speed for better community ambulation and to reduce fall risk.    Baseline  11/6: .341m 12/17: .45 m/s 1/2: .79m54m    Time  8    Period  Weeks    Status  Partially Met      PT LONG TERM GOAL #3   Title  Patient will reduce timed up and go to <11 seconds to reduce fall risk and demonstrate improved transfer/gait ability.    Baseline  11/6: 41 seconds 12/17: 33 seconds 1/2: 29 seconds     Time  8    Period  Weeks    Status  Partially Met      PT LONG TERM GOAL #4   Title  Patient will increase lower extremity functional scale to >60/80 to demonstrate improved functional mobility and increased tolerance with ADLs    Baseline  11/6: 48/80  12/17: 59/80     Time  8    Period  Weeks    Status  Partially Met      PT LONG TERM GOAL #5   Title  Patient will increase BLE gross strength to 4+/5 as to improve functional strength for independent gait, increased standing tolerance and increased ADL ability.    Baseline  R gross 3-/5 L 4/5    Time  8    Period  Weeks    Status  New    Target Date  03/10/17            Plan - 01/28/17 0915    Clinical Impression Statement  Patient's gait mechanics improving as hip extension is strengthening bilaterally and hip flexors are  Improving in extensibility. Standing lunges in // bars for hip flexion stretch improved patient's mobility and allowed for increased hip extension moment up terminal stance of RLE. Patient will continue to benefit from skilled physical therapy for improved strength, posture, and reduced pain    Rehab Potential  Fair    Clinical Impairments Affecting Rehab Potential  (-) R hip sx complications, consistant decline in function and mobility; (+) desire for progress,     PT Frequency  2x / week    PT Duration  8 weeks    PT Treatment/Interventions  ADLs/Self Care Home Management;Aquatic Therapy;Cryotherapy;Electrical Stimulation;Ultrasound;Traction;Moist  Heat;Iontophoresis 4mg66m Dexamethasone;DME Instruction;Gait training;Stair training;Functional mobility training;Therapeutic activities;Therapeutic exercise;Patient/family education;Neuromuscular re-education;Balance training;Manual techniques;Taping;Energy conservation;Passive range of motion    PT Next Visit Plan  hip stretch    PT Home Exercise Plan  see sheet    Consulted and Agree with Plan of Care  Patient       Patient will benefit from skilled therapeutic intervention in order to improve the following deficits and impairments:  Abnormal gait, Decreased knowledge of use of DME, Decreased coordination, Decreased activity tolerance, Decreased balance, Decreased endurance, Decreased range of motion, Decreased safety awareness, Decreased mobility, Decreased strength, Difficulty walking, Impaired flexibility, Impaired perceived functional ability, Increased muscle spasms, Hypomobility, Postural dysfunction, Improper body mechanics, Pain  Visit Diagnosis: Muscle weakness (generalized)  Stiffness of right knee, not elsewhere classified  Other abnormalities of gait and mobility  Stiffness of right hip, not elsewhere classified     Problem List Patient Active Problem List   Diagnosis Date Noted  . COPD (chronic obstructive pulmonary disease) (HCC)[place]/31/2018  . Primary localized osteoarthritis of right hip 07/21/2016   MariJanna Arch, DPT   MariJanna Arch7/2019, 9:31 AM  [facility]  MAIN [facility] SERVICES [place], [place], [address] Phone: [phone]   Fax:  [phone]  Name: Mackenzie Hill MRN: [id number] Date of Birth: [birth year]

## 2017-02-02 ENCOUNTER — Ambulatory Visit: Payer: Medicare HMO

## 2017-02-02 DIAGNOSIS — R2689 Other abnormalities of gait and mobility: Secondary | ICD-10-CM | POA: Diagnosis not present

## 2017-02-02 DIAGNOSIS — M25661 Stiffness of right knee, not elsewhere classified: Secondary | ICD-10-CM

## 2017-02-02 DIAGNOSIS — M25651 Stiffness of right hip, not elsewhere classified: Secondary | ICD-10-CM

## 2017-02-02 DIAGNOSIS — M6281 Muscle weakness (generalized): Secondary | ICD-10-CM | POA: Diagnosis not present

## 2017-02-02 NOTE — Therapy (Signed)
[place] MAIN [facility] SERVICES [address] [place], [place], [address] Phone: [phone]   Fax:  [phone]  Physical Therapy Treatment  Patient Details  Name: Mackenzie Hill MRN: [id number] Date of Birth: 23-Oct-1940 Referring Provider: Damaris Hippo, PA   Encounter Date: 02/02/2017  PT End of Session - 02/02/17 0840    Visit Number  19    Number of Visits  32    Date for PT Re-Evaluation  03/10/17    PT Start Time  0843    PT Stop Time  0930    PT Time Calculation (min)  47 min    Equipment Utilized During Treatment  Gait belt    Activity Tolerance  Patient tolerated treatment well;Patient limited by pain;Patient limited by fatigue    Behavior During Therapy  [facility] for tasks assessed/performed       Past Medical History:  Diagnosis Date  . Anemia   . Anemia, unspecified    Hgb 11.8 - 04/2013  . Arthritis   . Cataract    Bilateral  . CHF (congestive heart failure) ([place])   . Chronic kidney disease   . COPD (chronic obstructive pulmonary disease) ([place])   . DDD (degenerative disc disease), lumbar   . Degenerative arthritis   . Diabetes mellitus type 2, uncomplicated (HCC)    AODM (A1C 6.9%) 04/2013  . Diabetes mellitus without complication ([place])   . Dyspnea    with exertion  . Esophagitis   . Gout    Uric acid 5.4 - 08/2012  . Hypertension   . Obesity, unspecified   . Sarcoidosis, lung ([place])    reported by pt, Clinically without a biopsy  . Sleep apnea    OSA--Use C-PAP    Past Surgical History:  Procedure Laterality Date  . ABDOMINAL HYSTERECTOMY     age 55  . COLONOSCOPY  11/02/2005   Hyperplastic Polyp: CBF 10/2015; Recall Ltr mailed 08/27/2015 (dw)  . FOOT SURGERY Right   . HALLUX VALGUS REPAIR Left   . OOPHORECTOMY    . 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  Subjective Assessment - 02/02/17 0849    Subjective  Patient reports that she was feeling great Friday and Saturday, overdid it on Saturday cleaning her house and had to stay home from church sunday. Feeling very stiff this morning due to the cold weather (freezing overnight)    Pertinent History  Patient presents to physical therapy evaluation with SPC. Patient had R hip surgery on July 10th and ever since the surgery she has had difficulty bending her R knee. R knee is difficult to move, can't sit up and down without extreme difficulty. Had a cortisone shot in knees in August. 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     Limitations  Sitting;Standing;Walking;House hold activities;Other (comment)    How long can you sit comfortably?  20 minutes    How long can you stand comfortably?  20 minutes    How long can you walk comfortably?  20 minutes    Patient Stated Goals  sit down and get up easier.     Currently in Pain?  No/denies       Nustep lvl 1 4 minutes, seat 11    Manual Therapy:  R hip  SAD 5x30 seconds, lateral and inferior R hip LAD 5x 20 seconds inferior at different angles   R hip PA grade II-IV mobilization 5x 20 second bouts Prone R hip flexor stretch 4x 30 seconds guiding to correct excessive external rotation compensatory pattern.      ???????  Therapeutic Exercise:    Standing lunge hip flexor stretch 3x 30 seconds each leg (BLE)   Bed mobility rolling prone to supine bilaterally, Min A for supine to prone.    Prone hamstring curls with PT guiding to prevent  R Excessive ER. 2x 10x each leg   Prone donkey kicks 2x 10x each leg PT guiding to prevent  R Excessive ER.   Supine heel slides (BLE) 2x 10x , excessive ER with RLE    Sit to stand from raised.  EOB with focus on promoting equal weight shift between BLE.    Supine hamstring curls with swiss ball 10x , PT assisting for positioning    Seated  adduction squeezes 2x 10x 3 second holds   Seated adduction squeeze ball between ankles, LAQ 10x    Pt. response to medical necessity: Patient will continue to benefit from skilled physical therapy for improved strength, posture, and reduced pain                        PT Education - 02/02/17 0850    Education provided  Yes    Education Details  R hip mobility, strengthening of BLE's    Person(s) Educated  Patient    Methods  Explanation;Demonstration;Verbal cues    Comprehension  Verbalized understanding;Returned demonstration       PT Short Term Goals - 01/13/17 0946      PT SHORT TERM GOAL #1   Title  Patient will perform three five consecutive sit to stands in a row without a break demonstrating increased functional strength.     Baseline  requires excessive UE assist for 5     Time  2    Period  Weeks    Status  Partially Met      PT SHORT TERM GOAL #2   Title  Patient will be independent in home exercise program to improve strength/mobility for better functional independence with ADLs.    Baseline  Hep compliance    Time  2    Period  Weeks    Status  Achieved      PT SHORT TERM GOAL #3   Title  Patient will be modified independent in walking on even/uneven surface with least restrictive assistive device, for 10+ minutes without rest break, reporting some difficulty or less to improve walking tolerance with community ambulation including grocery shopping, going to church,etc    Baseline  ambulate 3 minutes and 35 seconds without rest break    Time  2    Period  Weeks    Status  Partially Met        PT Long Term Goals - 01/13/17 9678      PT LONG TERM GOAL #1   Title  Patient (> 75 years old) will complete five times sit to stand test in < 15 seconds indicating an increased LE strength and improved balance.    Baseline  11/6: 48 seconds 12/17: 40 seconds 1/2: 38 seconds with excessive UE support     Time  8    Period  Weeks    Status   Partially Met      PT LONG TERM  GOAL #2   Title  Patient will increase 10 meter walk test to >1.80ms as to improve gait speed for better community ambulation and to reduce fall risk.    Baseline  11/6: .318m 12/17: .45 m/s 1/2: .70m80m    Time  8    Period  Weeks    Status  Partially Met      PT LONG TERM GOAL #3   Title  Patient will reduce timed up and go to <11 seconds to reduce fall risk and demonstrate improved transfer/gait ability.    Baseline  11/6: 41 seconds 12/17: 33 seconds 1/2: 29 seconds     Time  8    Period  Weeks    Status  Partially Met      PT LONG TERM GOAL #4   Title  Patient will increase lower extremity functional scale to >60/80 to demonstrate improved functional mobility and increased tolerance with ADLs    Baseline  11/6: 48/80  12/17: 59/80     Time  8    Period  Weeks    Status  Partially Met      PT LONG TERM GOAL #5   Title  Patient will increase BLE gross strength to 4+/5 as to improve functional strength for independent gait, increased standing tolerance and increased ADL ability.    Baseline  R gross 3-/5 L 4/5    Time  8    Period  Weeks    Status  New    Target Date  03/10/17            Plan - 02/02/17 0914    Clinical Impression Statement  Patient initially stiff at beginning of session, however becomes more mobile throughout session due to combined manual and there ex interventions. Prone hamstring curls improving in mechanics with decreased need for cueing for bony alignment. 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 Patient will continue to benefit from skilled physical therapy for improved strength, posture, and reduced pain    Rehab Potential  Fair    Clinical Impairments Affecting Rehab Potential  (-) R hip sx complications, consistant decline in function and mobility; (+) desire for progress,     PT Frequency  2x / week     PT Duration  8 weeks    PT Treatment/Interventions  ADLs/Self Care Home Management;Aquatic Therapy;Cryotherapy;Electrical Stimulation;Ultrasound;Traction;Moist Heat;Iontophoresis 4mg19m Dexamethasone;DME Instruction;Gait training;Stair training;Functional mobility training;Therapeutic activities;Therapeutic exercise;Patient/family education;Neuromuscular re-education;Balance training;Manual techniques;Taping;Energy conservation;Passive range of motion    PT Next Visit Plan  hip stretch    PT Home Exercise Plan  see sheet    Consulted and Agree with Plan of Care  Patient       Patient will benefit from skilled therapeutic intervention in order to improve the following deficits and impairments:  Abnormal gait, Decreased knowledge of use of DME, Decreased coordination, Decreased activity tolerance, Decreased balance, Decreased endurance, Decreased range of motion, Decreased safety awareness, Decreased mobility, Decreased strength, Difficulty walking, Impaired flexibility, Impaired perceived functional ability, Increased muscle spasms, Hypomobility, Postural dysfunction, Improper body mechanics, Pain  Visit Diagnosis: Muscle weakness (generalized)  Stiffness of right knee, not elsewhere classified  Other abnormalities of gait and mobility  Stiffness of right hip, not elsewhere classified     Problem List Patient Active Problem List   Diagnosis Date Noted  . COPD (chronic obstructive pulmonary disease) (HCC)[place]/31/2018  . Primary localized osteoarthritis of right hip 07/21/2016  Mackenzie Hill, PT, DPT   Mackenzie Hill 02/02/2017, 9:28 AM  [place] MAIN [facility] SERVICES [address] [place], [place], [address] Phone: [phone]   Fax:  [phone]  Name: ARMONII SIEH MRN: [id number] Date of Birth: 16-Dec-1940

## 2017-02-04 ENCOUNTER — Ambulatory Visit: Payer: Medicare HMO

## 2017-02-04 DIAGNOSIS — M25651 Stiffness of right hip, not elsewhere classified: Secondary | ICD-10-CM

## 2017-02-04 DIAGNOSIS — R2689 Other abnormalities of gait and mobility: Secondary | ICD-10-CM

## 2017-02-04 DIAGNOSIS — M6281 Muscle weakness (generalized): Secondary | ICD-10-CM

## 2017-02-04 DIAGNOSIS — M25661 Stiffness of right knee, not elsewhere classified: Secondary | ICD-10-CM

## 2017-02-04 NOTE — Therapy (Signed)
[place] MAIN [facility] SERVICES [address] [place], [place], [address] Phone: [phone]   Fax:  [phone]  Physical Therapy Treatment  Patient Details  Name: Mackenzie Hill MRN: [id number] Date of Birth: 03-Jul-1940 Referring Provider: Damaris Hippo, PA   Encounter Date: 02/04/2017  PT End of Session - 02/04/17 0942    Visit Number  20    Number of Visits  32    Date for PT Re-Evaluation  03/10/17    Equipment Utilized During Treatment  Gait belt    Activity Tolerance  Patient tolerated treatment well;Patient limited by pain;Patient limited by fatigue    Behavior During Therapy  [facility] for tasks assessed/performed       Past Medical History:  Diagnosis Date  . Anemia   . Anemia, unspecified    Hgb 11.8 - 04/2013  . Arthritis   . Cataract    Bilateral  . CHF (congestive heart failure) ([place])   . Chronic kidney disease   . COPD (chronic obstructive pulmonary disease) ([place])   . DDD (degenerative disc disease), lumbar   . Degenerative arthritis   . Diabetes mellitus type 2, uncomplicated (HCC)    AODM (A1C 6.9%) 04/2013  . Diabetes mellitus without complication ([place])   . Dyspnea    with exertion  . Esophagitis   . Gout    Uric acid 5.4 - 08/2012  . Hypertension   . Obesity, unspecified   . Sarcoidosis, lung ([place])    reported by pt, Clinically without a biopsy  . Sleep apnea    OSA--Use C-PAP    Past Surgical History:  Procedure Laterality Date  . ABDOMINAL HYSTERECTOMY     age 40  . COLONOSCOPY  11/02/2005   Hyperplastic Polyp: CBF 10/2015; Recall Ltr mailed 08/27/2015 (dw)  . FOOT SURGERY Right   . HALLUX VALGUS REPAIR Left   . OOPHORECTOMY    . 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  Subjective Assessment - 02/04/17 0932     Subjective  Patient presents to physical therapy in W/C, says the hip is still stiff and she "slept on it wrong". Weather (low pressure and chilly) may be contributing. Patient denies pain at rest, says she just can't move well.  Patient reports pain in weight bearing upon standing, limiting her ability to walk.     Pertinent History  Patient presents to physical therapy evaluation with SPC. Patient had R hip surgery on July 10th and ever since the surgery she has had difficulty bending her R knee. R knee is difficult to move, can't sit up and down without extreme difficulty. Had a cortisone shot in knees in August. 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     Limitations  Sitting;Standing;Walking;House hold activities;Other (comment)    How long can you sit comfortably?  20 minutes    How long can you stand comfortably?  20 minutes    How long can you walk comfortably?  20 minutes    Patient Stated Goals  sit down and get up easier.     Currently in Pain?  No/denies    Pain Score  0-No pain    Multiple Pain Sites  No         TREATMENT  Patient presented in W/C. Attempted  ambulation with spc, discontinued due to patient presentation of stiffness/pain initially with Weight bearing  Manual - Supine Hip AP mob x 4 min total , LAD x 4 min; inferior with increasing angles of abduction , SAD x 5 min laterally with inferior rotation pull for correction of bony alignment   Therapeutic exercise: Bilateral leg fall-outs (abduction) in hook lying x 15; patient favors LLE R heel slides x 15 min verbal cues for stretching leg to full end range, avoiding ER Bridges x 10 min verbal cues for correct exercise technique; PT support at knees Hamstring curls on swedish ball x 10 PT support at ball   Bed mobility - roll to prone x 1 min A  Manual - Prone Hip PA mob x 3 min total   Therapeutic exercise: Therapist-assisted bent-knee stretch for hip flexors 3 x 30  sec Therapist-assisted external rotator stretch 3 x 30 sec Donkey kicks R x 15 PT guiding to reduce ER compensation, tactile cues to lateral aspect of leg   Elevated STS x 1 with CGA Ambulation with spc, CGA 100 ft with min cues for  upright posture. When patient upright, noted decrease in step length bilaterally due to limited extension range available. Patient reported less stiffness, more functional mobility.  Patient tolerated treatment well. Reported no increased pain.     PT Education - 02/04/17 0935    Education provided  Yes    Education Details  R hip mobility, strengthening of BLEs    Person(s) Educated  Patient    Methods  Explanation;Tactile cues;Verbal cues    Comprehension  Verbalized understanding;Verbal cues required;Tactile cues required       PT Short Term Goals - 01/13/17 0946      PT SHORT TERM GOAL #1   Title  Patient will perform three five consecutive sit to stands in a row without a break demonstrating increased functional strength.     Baseline  requires excessive UE assist for 5     Time  2    Period  Weeks    Status  Partially Met      PT SHORT TERM GOAL #2   Title  Patient will be independent in home exercise program to improve strength/mobility for better functional independence with ADLs.    Baseline  Hep compliance    Time  2    Period  Weeks    Status  Achieved      PT SHORT TERM GOAL #3   Title  Patient will be modified independent in walking on even/uneven surface with least restrictive assistive device, for 10+ minutes without rest break, reporting some difficulty or less to improve walking tolerance with community ambulation including grocery shopping, going to church,etc    Baseline  ambulate 3 minutes and 35 seconds without rest break    Time  2    Period  Weeks    Status  Partially Met        PT Long Term Goals - 01/13/17 4562      PT LONG TERM GOAL #1   Title  Patient (> 63 years old) will complete five times sit to stand test  in < 15 seconds indicating an increased LE strength and improved balance.    Baseline  11/6: 48 seconds 12/17: 40 seconds 1/2: 38 seconds with excessive UE support     Time  8    Period  Weeks    Status  Partially Met      PT LONG TERM GOAL #  2   Title  Patient will increase 10 meter walk test to >1.54ms as to improve gait speed for better community ambulation and to reduce fall risk.    Baseline  11/6: .346m 12/17: .45 m/s 1/2: .28m27m    Time  8    Period  Weeks    Status  Partially Met      PT LONG TERM GOAL #3   Title  Patient will reduce timed up and go to <11 seconds to reduce fall risk and demonstrate improved transfer/gait ability.    Baseline  11/6: 41 seconds 12/17: 33 seconds 1/2: 29 seconds     Time  8    Period  Weeks    Status  Partially Met      PT LONG TERM GOAL #4   Title  Patient will increase lower extremity functional scale to >60/80 to demonstrate improved functional mobility and increased tolerance with ADLs    Baseline  11/6: 48/80  12/17: 59/80     Time  8    Period  Weeks    Status  Partially Met      PT LONG TERM GOAL #5   Title  Patient will increase BLE gross strength to 4+/5 as to improve functional strength for independent gait, increased standing tolerance and increased ADL ability.    Baseline  R gross 3-/5 L 4/5    Time  8    Period  Weeks    Status  New    Target Date  03/10/17            Plan - 02/04/17 0936    Clinical Impression Statement  Patient presents with increased stiffness but no pain at rest (only with weight bearing initially); evident difficulty moving relative to other days. Responded well to mobilizations and stretching, able to perform open chain exercises with good corrections for neutral alignment. Substitutions still evident in hip extension exercises but corrected somewhat with cues. Patient able to walk at end of session, though fatigued; reported no increase in pain. Patient will continue to benefit from skilled  physical therapy for improved strength, posture, and reduced pain.    Rehab Potential  Fair    Clinical Impairments Affecting Rehab Potential  (-) R hip sx complications, consistant decline in function and mobility; (+) desire for progress,     PT Frequency  2x / week    PT Duration  8 weeks    PT Treatment/Interventions  ADLs/Self Care Home Management;Aquatic Therapy;Cryotherapy;Electrical Stimulation;Ultrasound;Traction;Moist Heat;Iontophoresis 4mg66m Dexamethasone;DME Instruction;Gait training;Stair training;Functional mobility training;Therapeutic activities;Therapeutic exercise;Patient/family education;Neuromuscular re-education;Balance training;Manual techniques;Taping;Energy conservation;Passive range of motion    PT Next Visit Plan  hip stretch    PT Home Exercise Plan  see sheet    Consulted and Agree with Plan of Care  Patient       Patient will benefit from skilled therapeutic intervention in order to improve the following deficits and impairments:  Abnormal gait, Decreased knowledge of use of DME, Decreased coordination, Decreased activity tolerance, Decreased balance, Decreased endurance, Decreased range of motion, Decreased safety awareness, Decreased mobility, Decreased strength, Difficulty walking, Impaired flexibility, Impaired perceived functional ability, Increased muscle spasms, Hypomobility, Postural dysfunction, Improper body mechanics, Pain  Visit Diagnosis: Muscle weakness (generalized)  Stiffness of right knee, not elsewhere classified  Other abnormalities of gait and mobility  Stiffness of right hip, not elsewhere classified     Problem List Patient Active Problem List   Diagnosis Date Noted  . COPD (chronic obstructive pulmonary disease) (HCC)[place]  08/11/2016  . Primary localized osteoarthritis of right hip 07/21/2016   Virgia Land, SPT 02/04/2017, 1:36 PM This entire session was performed under direct supervision and direction of a licensed therapist/therapist  assistant . I have personally read, edited and approve of the note as written. Janna Arch, PT, DPT   [place] MAIN [facility] SERVICES [address] [place], [place], [address] Phone: [phone]   Fax:  [phone]  Name: Mackenzie Hill MRN: [id number] Date of Birth: June 22, 1940

## 2017-02-09 ENCOUNTER — Ambulatory Visit: Payer: Medicare HMO

## 2017-02-09 DIAGNOSIS — M25661 Stiffness of right knee, not elsewhere classified: Secondary | ICD-10-CM

## 2017-02-09 DIAGNOSIS — R2689 Other abnormalities of gait and mobility: Secondary | ICD-10-CM | POA: Diagnosis not present

## 2017-02-09 DIAGNOSIS — M6281 Muscle weakness (generalized): Secondary | ICD-10-CM | POA: Diagnosis not present

## 2017-02-09 DIAGNOSIS — M25651 Stiffness of right hip, not elsewhere classified: Secondary | ICD-10-CM | POA: Diagnosis not present

## 2017-02-09 NOTE — Therapy (Signed)
[place] MAIN [facility] SERVICES [address] [place], [place], [address] Phone: [phone]   Fax:  [phone]  Physical Therapy Treatment  Patient Details  Name: Mackenzie Hill MRN: [id number] Date of Birth: [birth year] Referring Provider: Damaris Hippo, PA   Encounter Date: 02/09/2017  PT End of Session - 02/09/17 1050    Visit Number  21    Number of Visits  32    Date for PT Re-Evaluation  03/10/17    PT Start Time  0845    PT Stop Time  0930    PT Time Calculation (min)  45 min    Equipment Utilized During Treatment  Gait belt    Activity Tolerance  Patient tolerated treatment well;Patient limited by pain    Behavior During Therapy  [facility] for tasks assessed/performed       Past Medical History:  Diagnosis Date  . Anemia   . Anemia, unspecified    Hgb 11.8 - 04/2013  . Arthritis   . Cataract    Bilateral  . CHF (congestive heart failure) ([place])   . Chronic kidney disease   . COPD (chronic obstructive pulmonary disease) ([place])   . DDD (degenerative disc disease), lumbar   . Degenerative arthritis   . Diabetes mellitus type 2, uncomplicated (HCC)    AODM (A1C 6.9%) 04/2013  . Diabetes mellitus without complication ([place])   . Dyspnea    with exertion  . Esophagitis   . Gout    Uric acid 5.4 - 08/2012  . Hypertension   . Obesity, unspecified   . Sarcoidosis, lung ([place])    reported by pt, Clinically without a biopsy  . Sleep apnea    OSA--Use C-PAP    Past Surgical History:  Procedure Laterality Date  . ABDOMINAL HYSTERECTOMY     age 43  . COLONOSCOPY  11/02/2005   Hyperplastic Polyp: CBF 10/2015; Recall Ltr mailed 08/27/2015 (dw)  . FOOT SURGERY Right   . HALLUX VALGUS REPAIR Left   . OOPHORECTOMY    . 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  Subjective Assessment - 02/09/17 0849    Subjective  Patient reports to physical therapy with spc feeling good, "just a little stiff". Felt much better after last session. Reports a quiet weekend and no pain.    Pertinent History  Patient presents to physical therapy evaluation with SPC. Patient had R hip surgery on July 10th and ever since the surgery she has had difficulty bending her R knee. R knee is difficult to move, can't sit up and down without extreme difficulty. Had a cortisone shot in knees in August. 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     Limitations  Sitting;Standing;Walking;House hold activities;Other (comment)    How long can you sit comfortably?  20 minutes    How long can you stand comfortably?  20 minutes    How long can you walk comfortably?  20 minutes    Patient Stated Goals  sit down and get up easier.     Currently in Pain?  No/denies    Pain Score  0-No pain    Multiple Pain Sites  No      TREATMENT  Warm-up: nu-step lv 1 x 4 min  Standing lunge hip flexor  stretch BLE 3 x 30 sec cues for alignment of back foot  Supine : R hip LAD lateral and inferior 6 x 30 sec each R hip SAD lateral and inferior 3 x 30 sec each Heel slides x 10 R cues for range - interrupted due to spasms of quadriceps. Resumed after 40 sec rest. Swiss ball curls x 10 therapist stabilized, cues for alignment of RLE (externally rotated)  Bed mobility: Supine to seated x 1 with min A Seated to prone x 1 with min A  Prone: Hip flexor stretch with IR moment to achieve neutral alignment 3 x 30 sec AP mobs to R hip grade 2-4 three bouts of 30 sec Donkey kicks x 10 B cues for alignment HS curls yellow theraband x 10 B  Seated: Adduction ball squeezes x 10 Adduction squeeze + LAQ x 10 min-mod verbal/tactile cues for correct exercise technique. Glute squeezes x 10 min-mod verbal cues for correct exercise technique. Raised STS x 2 with adduction  squeeze for improved adductor activation  Patient tolerated treatment well. Patient reports fatigue but no increase in pain.     PT Education - 02/09/17 0837    Education provided  Yes    Education Details  R hip mobility, strengthening of BLEs    Person(s) Educated  Patient    Methods  Explanation;Tactile cues;Verbal cues    Comprehension  Verbalized understanding;Verbal cues required;Tactile cues required       PT Short Term Goals - 01/13/17 0946      PT SHORT TERM GOAL #1   Title  Patient will perform three five consecutive sit to stands in a row without a break demonstrating increased functional strength.     Baseline  requires excessive UE assist for 5     Time  2    Period  Weeks    Status  Partially Met      PT SHORT TERM GOAL #2   Title  Patient will be independent in home exercise program to improve strength/mobility for better functional independence with ADLs.    Baseline  Hep compliance    Time  2    Period  Weeks    Status  Achieved      PT SHORT TERM GOAL #3   Title  Patient will be modified independent in walking on even/uneven surface with least restrictive assistive device, for 10+ minutes without rest break, reporting some difficulty or less to improve walking tolerance with community ambulation including grocery shopping, going to church,etc    Baseline  ambulate 3 minutes and 35 seconds without rest break    Time  2    Period  Weeks    Status  Partially Met        PT Long Term Goals - 01/13/17 4650      PT LONG TERM GOAL #1   Title  Patient (> 51 years old) will complete five times sit to stand test in < 15 seconds indicating an increased LE strength and improved balance.    Baseline  11/6: 48 seconds 12/17: 40 seconds 1/2: 38 seconds with excessive UE support     Time  8    Period  Weeks    Status  Partially Met      PT LONG TERM GOAL #2   Title  Patient will increase 10 meter walk test to >1.21ms as to improve gait speed for better community  ambulation and to reduce fall risk.    Baseline  11/6: .375m 12/17: .  45 m/s 1/2: .11ms     Time  8    Period  Weeks    Status  Partially Met      PT LONG TERM GOAL #3   Title  Patient will reduce timed up and go to <11 seconds to reduce fall risk and demonstrate improved transfer/gait ability.    Baseline  11/6: 41 seconds 12/17: 33 seconds 1/2: 29 seconds     Time  8    Period  Weeks    Status  Partially Met      PT LONG TERM GOAL #4   Title  Patient will increase lower extremity functional scale to >60/80 to demonstrate improved functional mobility and increased tolerance with ADLs    Baseline  11/6: 48/80  12/17: 59/80     Time  8    Period  Weeks    Status  Partially Met      PT LONG TERM GOAL #5   Title  Patient will increase BLE gross strength to 4+/5 as to improve functional strength for independent gait, increased standing tolerance and increased ADL ability.    Baseline  R gross 3-/5 L 4/5    Time  8    Period  Weeks    Status  New    Target Date  03/10/17            Plan - 02/09/17 1051    Clinical Impression Statement  Patient presents in good spirits with no pain; ambulation less labored than during prior visit. Mild hip flexor spasms necessitated short rests during treatment but this may indicate improved intra-articular alignment. Reduced substitution in hip extension exercises relative to prior sessions with improvement in range. Patient reported fatigue but no pain at end of session. Patient will continue to benefit from skilled physical therapy for improved strength, posture and reduced pain.    Rehab Potential  Fair    Clinical Impairments Affecting Rehab Potential  (-) R hip sx complications, consistant decline in function and mobility; (+) desire for progress,     PT Frequency  2x / week    PT Duration  8 weeks    PT Treatment/Interventions  ADLs/Self Care Home Management;Aquatic Therapy;Cryotherapy;Electrical Stimulation;Ultrasound;Traction;Moist  Heat;Iontophoresis 413mml Dexamethasone;DME Instruction;Gait training;Stair training;Functional mobility training;Therapeutic activities;Therapeutic exercise;Patient/family education;Neuromuscular re-education;Balance training;Manual techniques;Taping;Energy conservation;Passive range of motion    PT Next Visit Plan  hip stretch    PT Home Exercise Plan  see sheet    Consulted and Agree with Plan of Care  Patient       Patient will benefit from skilled therapeutic intervention in order to improve the following deficits and impairments:  Abnormal gait, Decreased knowledge of use of DME, Decreased coordination, Decreased activity tolerance, Decreased balance, Decreased endurance, Decreased range of motion, Decreased safety awareness, Decreased mobility, Decreased strength, Difficulty walking, Impaired flexibility, Impaired perceived functional ability, Increased muscle spasms, Hypomobility, Postural dysfunction, Improper body mechanics, Pain  Visit Diagnosis: Muscle weakness (generalized)  Stiffness of right knee, not elsewhere classified  Other abnormalities of gait and mobility  Stiffness of right hip, not elsewhere classified     Problem List Patient Active Problem List   Diagnosis Date Noted  . COPD (chronic obstructive pulmonary disease) ([facility] [place]07/31/2018  . Primary localized osteoarthritis of right hip 07/21/2016   RoVirgia LandSPT 02/09/2017, 11:01 AM  This entire session was performed under direct supervision and direction of a licensed therapist/therapist assistant . I have personally read, edited and approve of the note as written. [employer]  Ermalene Postin PT, [place] MAIN [facility] SERVICES [address] [place], [place], [address] Phone: [phone]   Fax:  [phone]  Name: Mackenzie Hill MRN: [id number] Date of Birth: 1940/08/10

## 2017-02-11 ENCOUNTER — Ambulatory Visit: Payer: Medicare HMO

## 2017-02-11 DIAGNOSIS — M25651 Stiffness of right hip, not elsewhere classified: Secondary | ICD-10-CM

## 2017-02-11 DIAGNOSIS — R2689 Other abnormalities of gait and mobility: Secondary | ICD-10-CM | POA: Diagnosis not present

## 2017-02-11 DIAGNOSIS — M6281 Muscle weakness (generalized): Secondary | ICD-10-CM

## 2017-02-11 DIAGNOSIS — M25661 Stiffness of right knee, not elsewhere classified: Secondary | ICD-10-CM

## 2017-02-11 NOTE — Therapy (Signed)
[place] MAIN [facility] SERVICES [address] [place], [place], [address] Phone: [phone]   Fax:  [phone]  Physical Therapy Treatment  Patient Details  Name: Mackenzie Hill MRN: [id number] Date of Birth: Aug 26, 1940 Referring Provider: Damaris Hippo, PA   Encounter Date: 02/11/2017  PT End of Session - 02/11/17 0851    Visit Number  22    Number of Visits  32    Date for PT Re-Evaluation  03/10/17    PT Start Time  0846    PT Stop Time  0930    PT Time Calculation (min)  44 min    Equipment Utilized During Treatment  Gait belt    Activity Tolerance  Patient tolerated treatment well;Patient limited by pain    Behavior During Therapy  [facility] for tasks assessed/performed       Past Medical History:  Diagnosis Date  . Anemia   . Anemia, unspecified    Hgb 11.8 - 04/2013  . Arthritis   . Cataract    Bilateral  . CHF (congestive heart failure) ([place])   . Chronic kidney disease   . COPD (chronic obstructive pulmonary disease) ([place])   . DDD (degenerative disc disease), lumbar   . Degenerative arthritis   . Diabetes mellitus type 2, uncomplicated (HCC)    AODM (A1C 6.9%) 04/2013  . Diabetes mellitus without complication ([place])   . Dyspnea    with exertion  . Esophagitis   . Gout    Uric acid 5.4 - 08/2012  . Hypertension   . Obesity, unspecified   . Sarcoidosis, lung ([place])    reported by pt, Clinically without a biopsy  . Sleep apnea    OSA--Use C-PAP    Past Surgical History:  Procedure Laterality Date  . ABDOMINAL HYSTERECTOMY     age 77  . COLONOSCOPY  11/02/2005   Hyperplastic Polyp: CBF 10/2015; Recall Ltr mailed 08/27/2015 (dw)  . FOOT SURGERY Right   . HALLUX VALGUS REPAIR Left   . OOPHORECTOMY    . 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  Subjective Assessment - 02/11/17 0849    Subjective  Patient reports feeling extra stiff today from the bad weather, no pain just feels a catching on inside of R hip, has been happening ever since the day patient felt she slept wrong. Happens 2-3 x/day.     Pertinent History  Patient presents to physical therapy evaluation with SPC. Patient had R hip surgery on July 10th and ever since the surgery she has had difficulty bending her R knee. R knee is difficult to move, can't sit up and down without extreme difficulty. Had a cortisone shot in knees in August. 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     Limitations  Sitting;Standing;Walking;House hold activities;Other (comment)    How long can you sit comfortably?  20 minutes    How long can you stand comfortably?  20 minutes    How long can you walk comfortably?  20 minutes    Patient Stated Goals  sit down and get up easier.     Currently in Pain?  No/denies          TREATMENT  Warm-up: nu-step lvl 2 x 4 min  Standing lunge hip flexor stretch  BLE 2 x 30 sec cues for alignment of back foot, tactile cueing for RLE to prevent excessive ER  Supine : R hip LAD lateral and inferior 6 x 30 sec each Adductor ball squeezes in hooklying 1x10 with 2 second holds   Bed mobility: Supine to seated x 1 with min A Seated to prone x 1 with min A  Prone: PROM R quadriceps stretch with IR moment to achieve neutral alignment 3 x 30 sec PROM R iliopsoas stretch with towel under knee 3x 30 seconds with slight IR to achieve neutral bony alignment AP mobs to R hip grade 2-4 three bouts of 30 sec Donkey kicks 2x 10 BLE , tactile cues to RLE to decrease ER Hamstring curls 2x 10 BLE, tactile cues to RLE to decrease ER   Seated: Glute squeezes x 10 min-mod verbal cues for correct exercise technique. Raised STS x 5 with adduction squeeze for improved adductor activation  Patient tolerated treatment well.  Patient reports fatigue but no increase in pain.   Pt. response to medical necessity:  Patient will continue to benefit from skilled physical therapy for improved strength, posture, and reduced pain.    Half of intervention session performed with SPT Rory Cullen.                  PT Education - 02/11/17 0851    Education provided  Yes    Education Details  R Hip mobility, strengthening of BLE's     Person(s) Educated  Patient    Methods  Explanation;Demonstration;Verbal cues    Comprehension  Verbalized understanding;Returned demonstration       PT Short Term Goals - 01/13/17 0946      PT SHORT TERM GOAL #1   Title  Patient will perform three five consecutive sit to stands in a row without a break demonstrating increased functional strength.     Baseline  requires excessive UE assist for 5     Time  2    Period  Weeks    Status  Partially Met      PT SHORT TERM GOAL #2   Title  Patient will be independent in home exercise program to improve strength/mobility for better functional independence with ADLs.    Baseline  Hep compliance    Time  2    Period  Weeks    Status  Achieved      PT SHORT TERM GOAL #3   Title  Patient will be modified independent in walking on even/uneven surface with least restrictive assistive device, for 10+ minutes without rest break, reporting some difficulty or less to improve walking tolerance with community ambulation including grocery shopping, going to church,etc    Baseline  ambulate 3 minutes and 35 seconds without rest break    Time  2    Period  Weeks    Status  Partially Met        PT Long Term Goals - 01/13/17 5643      PT LONG TERM GOAL #1   Title  Patient (> 39 years old) will complete five times sit to stand test in < 15 seconds indicating an increased LE strength and improved balance.    Baseline  11/6: 48 seconds 12/17: 40 seconds 1/2: 38 seconds with excessive UE support     Time  8    Period  Weeks    Status   Partially Met      PT LONG TERM GOAL #2   Title  Patient will increase 10 meter walk test to >1.74ms as to improve gait speed for better community ambulation and to reduce fall risk.    Baseline  11/6: .312m 12/17: .45 m/s 1/2: .47m43m    Time  8    Period  Weeks    Status  Partially Met      PT LONG TERM GOAL #3   Title  Patient will reduce timed up and go to <11 seconds to reduce fall risk and demonstrate improved transfer/gait ability.    Baseline  11/6: 41 seconds 12/17: 33 seconds 1/2: 29 seconds     Time  8    Period  Weeks    Status  Partially Met      PT LONG TERM GOAL #4   Title  Patient will increase lower extremity functional scale to >60/80 to demonstrate improved functional mobility and increased tolerance with ADLs    Baseline  11/6: 48/80  12/17: 59/80     Time  8    Period  Weeks    Status  Partially Met      PT LONG TERM GOAL #5   Title  Patient will increase BLE gross strength to 4+/5 as to improve functional strength for independent gait, increased standing tolerance and increased ADL ability.    Baseline  R gross 3-/5 L 4/5    Time  8    Period  Weeks    Status  New    Target Date  03/10/17            Plan - 02/11/17 0914    Clinical Impression Statement  Patient initially stiff upon beginning of session, however improved with LE mobility after warming up through manual and there ex. Patient R hip improving with decreased hypomobility with PA mobilizations. Excessive tightness of R lateral hip musculature with decreased strength of R adductors combined with externally rotated bony alignment of humerus creates an excessive external rotation pattern in BLE requiring tactile cueing and Min A to regain a more neutral alignment. Patient will continue to benefit from skilled physical therapy for improved strength, posture, and reduced pain.     Rehab Potential  Fair    Clinical Impairments Affecting Rehab Potential  (-) R hip sx complications, consistant decline  in function and mobility; (+) desire for progress,     PT Frequency  2x / week    PT Duration  8 weeks    PT Treatment/Interventions  ADLs/Self Care Home Management;Aquatic Therapy;Cryotherapy;Electrical Stimulation;Ultrasound;Traction;Moist Heat;Iontophoresis 4mg347m Dexamethasone;DME Instruction;Gait training;Stair training;Functional mobility training;Therapeutic activities;Therapeutic exercise;Patient/family education;Neuromuscular re-education;Balance training;Manual techniques;Taping;Energy conservation;Passive range of motion    PT Next Visit Plan  R hip flexors and lateral muscualture strength , strengthen R adductors,     PT Home Exercise Plan  see sheet    Consulted and Agree with Plan of Care  Patient       Patient will benefit from skilled therapeutic intervention in order to improve the following deficits and impairments:  Abnormal gait, Decreased knowledge of use of DME, Decreased coordination, Decreased activity tolerance, Decreased balance, Decreased endurance, Decreased range of motion, Decreased safety awareness, Decreased mobility, Decreased strength, Difficulty walking, Impaired flexibility, Impaired perceived functional ability, Increased muscle spasms, Hypomobility, Postural dysfunction, Improper body mechanics, Pain  Visit Diagnosis: Muscle weakness (generalized)  Stiffness of right knee, not elsewhere classified  Other abnormalities of gait and mobility  Stiffness of right hip, not elsewhere classified     Problem List Patient Active Problem List   Diagnosis  Date Noted  . COPD (chronic obstructive pulmonary disease) ([place]) 08/11/2016  . Primary localized osteoarthritis of right hip 07/21/2016   Janna Arch, PT, DPT    Janna Arch 02/11/2017, 9:29 AM  [place] MAIN [facility] SERVICES [address] [place], [place], [address] Phone: [phone]   Fax:  [phone]  Name: JULYANNA SCHOLLE MRN: [id number] Date of Birth:  [birth year]

## 2017-02-16 ENCOUNTER — Ambulatory Visit: Payer: Medicare HMO | Attending: Student

## 2017-02-16 DIAGNOSIS — M25651 Stiffness of right hip, not elsewhere classified: Secondary | ICD-10-CM | POA: Insufficient documentation

## 2017-02-16 DIAGNOSIS — M6281 Muscle weakness (generalized): Secondary | ICD-10-CM | POA: Diagnosis not present

## 2017-02-16 DIAGNOSIS — R2689 Other abnormalities of gait and mobility: Secondary | ICD-10-CM | POA: Diagnosis not present

## 2017-02-16 DIAGNOSIS — M25661 Stiffness of right knee, not elsewhere classified: Secondary | ICD-10-CM | POA: Diagnosis not present

## 2017-02-16 NOTE — Therapy (Addendum)
[place] MAIN [facility] SERVICES [address] [place], [place], [address] Phone: [phone]   Fax:  [phone]  Physical Therapy Treatment  Patient Details  Name: Mackenzie Hill MRN: [id number] Date of Birth: 24-Oct-1940 Referring Provider: Damaris Hippo, PA   Encounter Date: 02/16/2017  PT End of Session - 02/16/17 0859    Visit Number  23    Number of Visits  32    Date for PT Re-Evaluation  03/10/17    PT Start Time  0845    PT Stop Time  0930    PT Time Calculation (min)  45 min    Equipment Utilized During Treatment  Gait belt    Activity Tolerance  Patient tolerated treatment well;Patient limited by pain    Behavior During Therapy  [facility] for tasks assessed/performed       Past Medical History:  Diagnosis Date  . Anemia   . Anemia, unspecified    Hgb 11.8 - 04/2013  . Arthritis   . Cataract    Bilateral  . CHF (congestive heart failure) ([place])   . Chronic kidney disease   . COPD (chronic obstructive pulmonary disease) ([place])   . DDD (degenerative disc disease), lumbar   . Degenerative arthritis   . Diabetes mellitus type 2, uncomplicated (HCC)    AODM (A1C 6.9%) 04/2013  . Diabetes mellitus without complication ([place])   . Dyspnea    with exertion  . Esophagitis   . Gout    Uric acid 5.4 - 08/2012  . Hypertension   . Obesity, unspecified   . Sarcoidosis, lung ([place])    reported by pt, Clinically without a biopsy  . Sleep apnea    OSA--Use C-PAP    Past Surgical History:  Procedure Laterality Date  . ABDOMINAL HYSTERECTOMY     age 14  . COLONOSCOPY  11/02/2005   Hyperplastic Polyp: CBF 10/2015; Recall Ltr mailed 08/27/2015 (dw)  . FOOT SURGERY Right   . HALLUX VALGUS REPAIR Left   . OOPHORECTOMY    . 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  Subjective Assessment - 02/16/17 0854    Subjective  Patient reports feeling less stiff now that the weather is warming up. No pain, just stiffness.     Pertinent History  Patient presents to physical therapy evaluation with SPC. Patient had R hip surgery on July 10th and ever since the surgery she has had difficulty bending her R knee. R knee is difficult to move, can't sit up and down without extreme difficulty. Had a cortisone shot in knees in August. 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     Limitations  Sitting;Standing;Walking;House hold activities;Other (comment)    How long can you sit comfortably?  20 minutes    How long can you stand comfortably?  20 minutes    How long can you walk comfortably?  20 minutes    Patient Stated Goals  sit down and get up easier.     Currently in Pain?  No/denies     Nustep Lvl 2 5 minutes  Standing: Hip flexor stretch (forward lunge) 2x 30 seconds (BLE): cues for keeping back leg straight Standing hip extension: 2x12x  (BLE) require UE support; cues for taking large step back then bringing legs  together  Standing hip abduction 2x12 (BLE) require UE support Ambulating backwards in // bars 6x cues for decreased speed and increased step length    seated:  Adduction squeezes 20x   Prone:  PROM R quadriceps stretch with IR moment to achieve neutral alignment 3 x 30 sec PROM R iliopsoas stretch with towel under knee 3x 30 seconds with slight IR to achieve neutral bony alignment PA mobsto R hip grade 2-4 three bouts of 30 sec Donkey kicks2x 10 BLE, tactile cues to RLE to decrease ER Hamstring curls 2x 10 BLE, tactile cues to RLE to decrease ER    Bed mobility: Supine to seated x 1 with min A Seated to prone x 1 with min A   Sit to stand 5x from raised plinth: tactile cueing on RLE to instigate muscle contraction cues for bringing RLE back further to increase weight bearing on RLE.    Patient tolerated  treatment well. Patient reportsfatigue butno increase in pain.   Pt. response to medical necessity:  Patient will continue to benefit from skilled physical therapy for improved strength, posture, and reduced pain.                    PT Education - 02/16/17 0859    Education provided  Yes    Education Details  R hip mobility, strenghtening of BLE's    Person(s) Educated  Patient    Methods  Explanation;Demonstration;Verbal cues    Comprehension  Verbalized understanding;Returned demonstration       PT Short Term Goals - 01/13/17 0946      PT SHORT TERM GOAL #1   Title  Patient will perform three five consecutive sit to stands in a row without a break demonstrating increased functional strength.     Baseline  requires excessive UE assist for 5     Time  2    Period  Weeks    Status  Partially Met      PT SHORT TERM GOAL #2   Title  Patient will be independent in home exercise program to improve strength/mobility for better functional independence with ADLs.    Baseline  Hep compliance    Time  2    Period  Weeks    Status  Achieved      PT SHORT TERM GOAL #3   Title  Patient will be modified independent in walking on even/uneven surface with least restrictive assistive device, for 10+ minutes without rest break, reporting some difficulty or less to improve walking tolerance with community ambulation including grocery shopping, going to church,etc    Baseline  ambulate 3 minutes and 35 seconds without rest break    Time  2    Period  Weeks    Status  Partially Met        PT Long Term Goals - 01/13/17 9373      PT LONG TERM GOAL #1   Title  Patient (> 68 years old) will complete five times sit to stand test in < 15 seconds indicating an increased LE strength and improved balance.    Baseline  11/6: 48 seconds 12/17: 40 seconds 1/2: 38 seconds with excessive UE support     Time  8    Period  Weeks    Status  Partially Met      PT LONG TERM GOAL #2    Title  Patient will increase 10 meter walk test to >1.75ms as to improve gait speed for better community  ambulation and to reduce fall risk.    Baseline  11/6: .18ms 12/17: .45 m/s 1/2: .526m     Time  8    Period  Weeks    Status  Partially Met      PT LONG TERM GOAL #3   Title  Patient will reduce timed up and go to <11 seconds to reduce fall risk and demonstrate improved transfer/gait ability.    Baseline  11/6: 41 seconds 12/17: 33 seconds 1/2: 29 seconds     Time  8    Period  Weeks    Status  Partially Met      PT LONG TERM GOAL #4   Title  Patient will increase lower extremity functional scale to >60/80 to demonstrate improved functional mobility and increased tolerance with ADLs    Baseline  11/6: 48/80  12/17: 59/80     Time  8    Period  Weeks    Status  Partially Met      PT LONG TERM GOAL #5   Title  Patient will increase BLE gross strength to 4+/5 as to improve functional strength for independent gait, increased standing tolerance and increased ADL ability.    Baseline  R gross 3-/5 L 4/5    Time  8    Period  Weeks    Status  New    Target Date  03/10/17            Plan - 02/16/17 1249    Clinical Impression Statement  Patient progressing with weight bearing interventions for strengthening of BLE at this time. Patient R hip improving with decreased hypomobility upon PA mobilization. Excessive tightness of R lateral hip musculature with decreased strength of R adductors result in externally rotated bony alignment.  Patient will continue to benefit from skilled physical therapy for improved strength, posture, and reduced pain.     Rehab Potential  Fair    Clinical Impairments Affecting Rehab Potential  (-) R hip sx complications, consistant decline in function and mobility; (+) desire for progress,     PT Frequency  2x / week    PT Duration  8 weeks    PT Treatment/Interventions  ADLs/Self Care Home Management;Aquatic Therapy;Cryotherapy;Electrical  Stimulation;Ultrasound;Traction;Moist Heat;Iontophoresis 28m36ml Dexamethasone;DME Instruction;Gait training;Stair training;Functional mobility training;Therapeutic activities;Therapeutic exercise;Patient/family education;Neuromuscular re-education;Balance training;Manual techniques;Taping;Energy conservation;Passive range of motion    PT Next Visit Plan  R hip flexors and lateral muscualture strength , strengthen R adductors,     PT Home Exercise Plan  see sheet    Consulted and Agree with Plan of Care  Patient       Patient will benefit from skilled therapeutic intervention in order to improve the following deficits and impairments:  Abnormal gait, Decreased knowledge of use of DME, Decreased coordination, Decreased activity tolerance, Decreased balance, Decreased endurance, Decreased range of motion, Decreased safety awareness, Decreased mobility, Decreased strength, Difficulty walking, Impaired flexibility, Impaired perceived functional ability, Increased muscle spasms, Hypomobility, Postural dysfunction, Improper body mechanics, Pain  Visit Diagnosis: Muscle weakness (generalized)  Stiffness of right knee, not elsewhere classified  Other abnormalities of gait and mobility  Stiffness of right hip, not elsewhere classified     Problem List Patient Active Problem List   Diagnosis Date Noted  . COPD (chronic obstructive pulmonary disease) ([place]CC[place]7/31/2018  . Primary localized osteoarthritis of right hip 07/21/2016   MarJanna ArchT, DPT   MarJanna Arch5/2019, 12:49 PM  [facility] [address]  [place], [place], [address] Phone: [phone]   Fax:  [phone]  Name: Mackenzie Hill MRN: [id number] Date of Birth: 1940/07/20

## 2017-02-18 ENCOUNTER — Ambulatory Visit: Payer: Medicare HMO

## 2017-02-18 DIAGNOSIS — M6281 Muscle weakness (generalized): Secondary | ICD-10-CM

## 2017-02-18 DIAGNOSIS — M25661 Stiffness of right knee, not elsewhere classified: Secondary | ICD-10-CM | POA: Diagnosis not present

## 2017-02-18 DIAGNOSIS — R2689 Other abnormalities of gait and mobility: Secondary | ICD-10-CM | POA: Diagnosis not present

## 2017-02-18 DIAGNOSIS — M25651 Stiffness of right hip, not elsewhere classified: Secondary | ICD-10-CM

## 2017-02-18 NOTE — Therapy (Addendum)
[place] MAIN [facility] SERVICES [address] [place], [place], [address] Phone: [phone]   Fax:  [phone]  Physical Therapy Treatment  Patient Details  Name: Mackenzie Hill MRN: [id number] Date of Birth: [birth year] Referring Provider: Damaris Hippo, PA   Encounter Date: 02/18/2017  PT End of Session - 02/18/17 0942    Visit Number  24    Number of Visits  32    Date for PT Re-Evaluation  03/10/17    PT Start Time  0847    PT Stop Time  0930    PT Time Calculation (min)  43 min    Equipment Utilized During Treatment  Gait belt    Activity Tolerance  Patient tolerated treatment well;Patient limited by fatigue    Behavior During Therapy  [facility] for tasks assessed/performed        Past Medical History:  Diagnosis Date  . Anemia   . Anemia, unspecified    Hgb 11.8 - 04/2013  . Arthritis   . Cataract    Bilateral  . CHF (congestive heart failure) ([place])   . Chronic kidney disease   . COPD (chronic obstructive pulmonary disease) ([place])   . DDD (degenerative disc disease), lumbar   . Degenerative arthritis   . Diabetes mellitus type 2, uncomplicated (HCC)    AODM (A1C 6.9%) 04/2013  . Diabetes mellitus without complication ([place])   . Dyspnea    with exertion  . Esophagitis   . Gout    Uric acid 5.4 - 08/2012  . Hypertension   . Obesity, unspecified   . Sarcoidosis, lung ([place])    reported by pt, Clinically without a biopsy  . Sleep apnea    OSA--Use C-PAP    Past Surgical History:  Procedure Laterality Date  . ABDOMINAL HYSTERECTOMY     age 77  . COLONOSCOPY  11/02/2005   Hyperplastic Polyp: CBF 10/2015; Recall Ltr mailed 08/27/2015 (dw)  . FOOT SURGERY Right   . HALLUX VALGUS REPAIR Left   . OOPHORECTOMY    . 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  Subjective Assessment - 02/18/17 0859    Subjective  Patient reports considerable stiffness. Reports that stiffness is worse in morning and improves thorughout the day. Says that she has been stretching often at home.    Pertinent History  Patient presents to physical therapy evaluation with SPC. Patient had R hip surgery on July 10th and ever since the surgery she has had difficulty bending her R knee. R knee is difficult to move, can't sit up and down without extreme difficulty. Had a cortisone shot in knees in August. 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     Limitations  Sitting;Standing;Walking;House hold activities;Other (comment)    How long can you sit comfortably?  20 minutes    How long can you stand comfortably?  20 minutes    How long can you walk comfortably?  20 minutes    Patient Stated Goals  sit down and get up easier.     Currently in Pain?  No/denies    Pain Score  0-No pain    Multiple Pain Sites  No      TREATMENT  Standing hip flexor stretch in parallel bars 3 x 60 sec; patient  reminded of appropriate duration and frequency of stretching at home.  Warm-up: Nustep Lvl 2 5 minutes (unbilled)  Manual therapy: Long arc distraction RLE 4 x 45 sec with increasing angles of abduction Short arc distraction RLE 4 x 30 sec with internal rotation moment  Therapeutic exercise: Parallel bars: Standing hip extension with weight transfer 2 x 10 BLE with cues to maintain feet parallel (avoiding external rotation). No UE support Sidestepping x 6 lengths, 3 with fingertip support, 3 without UE support Ambulating backwards x 6 lengths cues for step length, leading with LLE  Airex pad: Standing balance no UE support 10 sec no LOB in // bars CGA Head turns x 8 no UE support no LOB in // bars CGA Eyes closed x 10 sec no UE support no LOB in // bars with CGA Ball tosses x 20 with CGA mild posterior sway no LOB in // bars  Patient  tolerated treatment well demonstrating improved posture (more upright) at end of treatment session; reported feeling better, some fatigue, no increased pain.   PT Education - 02/18/17 [phone]    Education provided  Yes    Education Details  R hip mobility, strengthening of BLEs, balance    Person(s) Educated  Patient    Methods  Explanation;Demonstration;Verbal cues    Comprehension  Verbalized understanding;Returned demonstration;Verbal cues required       PT Short Term Goals - 01/13/17 0946      PT SHORT TERM GOAL #1   Title  Patient will perform three five consecutive sit to stands in a row without a break demonstrating increased functional strength.     Baseline  requires excessive UE assist for 5     Time  2    Period  Weeks    Status  Partially Met      PT SHORT TERM GOAL #2   Title  Patient will be independent in home exercise program to improve strength/mobility for better functional independence with ADLs.    Baseline  Hep compliance    Time  2    Period  Weeks    Status  Achieved      PT SHORT TERM GOAL #3   Title  Patient will be modified independent in walking on even/uneven surface with least restrictive assistive device, for 10+ minutes without rest break, reporting some difficulty or less to improve walking tolerance with community ambulation including grocery shopping, going to church,etc    Baseline  ambulate 3 minutes and 35 seconds without rest break    Time  2    Period  Weeks    Status  Partially Met        PT Long Term Goals - 01/13/17 9381      PT LONG TERM GOAL #1   Title  Patient (> 66 years old) will complete five times sit to stand test in < 15 seconds indicating an increased LE strength and improved balance.    Baseline  11/6: 48 seconds 12/17: 40 seconds 1/2: 38 seconds with excessive UE support     Time  8    Period  Weeks    Status  Partially Met      PT LONG TERM GOAL #2   Title  Patient will increase 10 meter walk test to >1.59ms as to  improve gait speed for better community ambulation and to reduce fall risk.    Baseline  11/6: .343m 12/17: .45 m/s 1/2: .70m39m    Time  8  Period  Weeks    Status  Partially Met      PT LONG TERM GOAL #3   Title  Patient will reduce timed up and go to <11 seconds to reduce fall risk and demonstrate improved transfer/gait ability.    Baseline  11/6: 41 seconds 12/17: 33 seconds 1/2: 29 seconds     Time  8    Period  Weeks    Status  Partially Met      PT LONG TERM GOAL #4   Title  Patient will increase lower extremity functional scale to >60/80 to demonstrate improved functional mobility and increased tolerance with ADLs    Baseline  11/6: 48/80  12/17: 59/80     Time  8    Period  Weeks    Status  Partially Met      PT LONG TERM GOAL #5   Title  Patient will increase BLE gross strength to 4+/5 as to improve functional strength for independent gait, increased standing tolerance and increased ADL ability.    Baseline  R gross 3-/5 L 4/5    Time  8    Period  Weeks    Status  New    Target Date  03/10/17            Plan - 02/18/17 0948    Clinical Impression Statement  R hip presented hypomobile but responded immediately to manual therapy and stretching. Patient demonstrates improvements in strength and balance evidenced by reduced UE support and reduced external rotation in RLE during standing exercises; responding well to extension exercises. Patient posture improved and more upright at end of session and patient demonstrated greater ease in rising from chair. Patient will continue to benefit from skilled physical therapy for improved strength, posture, and reduced pain.    Rehab Potential  Fair    Clinical Impairments Affecting Rehab Potential  (-) R hip sx complications, consistant decline in function and mobility; (+) desire for progress,     PT Frequency  2x / week    PT Duration  8 weeks    PT Treatment/Interventions  ADLs/Self Care Home Management;Aquatic  Therapy;Cryotherapy;Electrical Stimulation;Ultrasound;Traction;Moist Heat;Iontophoresis 40m/ml Dexamethasone;DME Instruction;Gait training;Stair training;Functional mobility training;Therapeutic activities;Therapeutic exercise;Patient/family education;Neuromuscular re-education;Balance training;Manual techniques;Taping;Energy conservation;Passive range of motion    PT Next Visit Plan  R hip flexors and lateral muscualture strength , strengthen R adductors,     PT Home Exercise Plan  see sheet    Consulted and Agree with Plan of Care  Patient       Patient will benefit from skilled therapeutic intervention in order to improve the following deficits and impairments:  Abnormal gait, Decreased knowledge of use of DME, Decreased coordination, Decreased activity tolerance, Decreased balance, Decreased endurance, Decreased range of motion, Decreased safety awareness, Decreased mobility, Decreased strength, Difficulty walking, Impaired flexibility, Impaired perceived functional ability, Increased muscle spasms, Hypomobility, Postural dysfunction, Improper body mechanics, Pain  Visit Diagnosis: Muscle weakness (generalized)  Stiffness of right knee, not elsewhere classified  Other abnormalities of gait and mobility  Stiffness of right hip, not elsewhere classified     Problem List Patient Active Problem List   Diagnosis Date Noted  . COPD (chronic obstructive pulmonary disease) ([place] 08/11/2016  . Primary localized osteoarthritis of right hip 07/21/2016    RVirgia Land SPT  This entire session was performed under direct supervision and direction of a licensed therapist/therapist assistant . I have personally read, edited and approve of the note as written.  MJanna Arch PT, DPT  02/18/2017, 10:14 AM  [place] MAIN [facility] SERVICES [address] [place], [place], [address] Phone: [phone]   Fax:  [phone]  Name: Mackenzie Hill MRN:  [id number] Date of Birth: 03/18/1940

## 2017-02-23 ENCOUNTER — Ambulatory Visit: Payer: Medicare HMO

## 2017-02-23 DIAGNOSIS — R2689 Other abnormalities of gait and mobility: Secondary | ICD-10-CM | POA: Diagnosis not present

## 2017-02-23 DIAGNOSIS — M25661 Stiffness of right knee, not elsewhere classified: Secondary | ICD-10-CM

## 2017-02-23 DIAGNOSIS — M6281 Muscle weakness (generalized): Secondary | ICD-10-CM

## 2017-02-23 DIAGNOSIS — I10 Essential (primary) hypertension: Secondary | ICD-10-CM | POA: Diagnosis not present

## 2017-02-23 DIAGNOSIS — M25651 Stiffness of right hip, not elsewhere classified: Secondary | ICD-10-CM | POA: Diagnosis not present

## 2017-02-23 DIAGNOSIS — H52221 Regular astigmatism, right eye: Secondary | ICD-10-CM | POA: Diagnosis not present

## 2017-02-23 DIAGNOSIS — H5211 Myopia, right eye: Secondary | ICD-10-CM | POA: Diagnosis not present

## 2017-02-23 DIAGNOSIS — H524 Presbyopia: Secondary | ICD-10-CM | POA: Diagnosis not present

## 2017-02-23 DIAGNOSIS — E119 Type 2 diabetes mellitus without complications: Secondary | ICD-10-CM | POA: Diagnosis not present

## 2017-02-23 DIAGNOSIS — H43812 Vitreous degeneration, left eye: Secondary | ICD-10-CM | POA: Diagnosis not present

## 2017-02-23 DIAGNOSIS — H25813 Combined forms of age-related cataract, bilateral: Secondary | ICD-10-CM | POA: Diagnosis not present

## 2017-02-23 DIAGNOSIS — H401131 Primary open-angle glaucoma, bilateral, mild stage: Secondary | ICD-10-CM | POA: Diagnosis not present

## 2017-02-23 NOTE — Therapy (Signed)
[place] MAIN [facility] SERVICES [address] [place], [place], [address] Phone: [phone]   Fax:  [phone]  Physical Therapy Treatment  Patient Details  Name: Mackenzie Hill MRN: [id number] Date of Birth: 10/31/1940 Referring Provider: Damaris Hippo, PA   Encounter Date: 02/23/2017  PT End of Session - 02/23/17 0937    Visit Number  25    Number of Visits  32    Date for PT Re-Evaluation  03/10/17    PT Start Time  0848    PT Stop Time  0930    PT Time Calculation (min)  42 min    Equipment Utilized During Treatment  Gait belt    Activity Tolerance  Patient tolerated treatment well;Patient limited by fatigue    Behavior During Therapy  [facility] for tasks assessed/performed       Past Medical History:  Diagnosis Date  . Anemia   . Anemia, unspecified    Hgb 11.8 - 04/2013  . Arthritis   . Cataract    Bilateral  . CHF (congestive heart failure) ([place])   . Chronic kidney disease   . COPD (chronic obstructive pulmonary disease) ([place])   . DDD (degenerative disc disease), lumbar   . Degenerative arthritis   . Diabetes mellitus type 2, uncomplicated (HCC)    AODM (A1C 6.9%) 04/2013  . Diabetes mellitus without complication ([place])   . Dyspnea    with exertion  . Esophagitis   . Gout    Uric acid 5.4 - 08/2012  . Hypertension   . Obesity, unspecified   . Sarcoidosis, lung ([place])    reported by pt, Clinically without a biopsy  . Sleep apnea    OSA--Use C-PAP    Past Surgical History:  Procedure Laterality Date  . ABDOMINAL HYSTERECTOMY     age 56  . COLONOSCOPY  11/02/2005   Hyperplastic Polyp: CBF 10/2015; Recall Ltr mailed 08/27/2015 (dw)  . FOOT SURGERY Right   . HALLUX VALGUS REPAIR Left   . OOPHORECTOMY    . 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  Subjective Assessment - 02/23/17 0852    Subjective  Patient reports increased stiffness, possibly due to weather. Went to a funeral over the weekend. Reports doing HEP.    Pertinent History  Patient presents to physical therapy evaluation with SPC. Patient had R hip surgery on July 10th and ever since the surgery she has had difficulty bending her R knee. R knee is difficult to move, can't sit up and down without extreme difficulty. Had a cortisone shot in knees in August. 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     Limitations  Sitting;Standing;Walking;House hold activities;Other (comment)    How long can you sit comfortably?  20 minutes    How long can you stand comfortably?  20 minutes    How long can you walk comfortably?  20 minutes    Patient Stated Goals  sit down and get up easier.     Currently in Pain?  No/denies    Pain Score  0-No pain    Multiple Pain Sites  No       TREATMENT  Warm-up: Nustep Lvl 2 4 minutes (unbilled)   Manual therapy: Long arc distraction RLE 3 x 45 sec  with increasing angles of abduction Short arc distraction RLE 3 x 30 sec with internal rotation moment   RLE anterior hip mobilization grades III-IV two bouts of 20 seconds at each grade  RLE hip flexor stretch with internal rotation moment 3 x 30 sec  Bed mobility: Sitting EOB to supine independent with supervision   Supine to sitting EOB to prone min A  STS x 5 from raised surface. Therapist cues for foot position (bringing feet back, pushing through heels)  Therapeutic exercise: Parallel bars:  Sidestepping x 5 lengths no UE support, cues for upright posture Weight shifts with hip extension x 10 with cues for foot alignment (avoiding external rotation of LLE) Ambulating backwards x 6 lengths fingertip UE support, mod cues for foot alignment, leading with LLE, upright posture  Airex pad: Standing with feet together x 10 sec no LOB  with eyes closed x  10 sec no LOB Ball toss x 20 tosses with CGA, patient able to adapt well to tosses off midline, self-corrects LOB Step-ups to 4-in step no UE support x 12; patient unsteady requiring CGA, LOB to left and posterior with fatigue  PT Education - 02/23/17 1034    Education provided  Yes    Education Details  R hip mobility, BLE strengthening/balance    Person(s) Educated  Patient    Methods  Explanation;Demonstration;Verbal cues    Comprehension  Verbalized understanding;Returned demonstration;Verbal cues required       PT Short Term Goals - 01/13/17 0946      PT SHORT TERM GOAL #1   Title  Patient will perform three five consecutive sit to stands in a row without a break demonstrating increased functional strength.     Baseline  requires excessive UE assist for 5     Time  2    Period  Weeks    Status  Partially Met      PT SHORT TERM GOAL #2   Title  Patient will be independent in home exercise program to improve strength/mobility for better functional independence with ADLs.    Baseline  Hep compliance    Time  2    Period  Weeks    Status  Achieved      PT SHORT TERM GOAL #3   Title  Patient will be modified independent in walking on even/uneven surface with least restrictive assistive device, for 10+ minutes without rest break, reporting some difficulty or less to improve walking tolerance with community ambulation including grocery shopping, going to church,etc    Baseline  ambulate 3 minutes and 35 seconds without rest break    Time  2    Period  Weeks    Status  Partially Met        PT Long Term Goals - 01/13/17 5732      PT LONG TERM GOAL #1   Title  Patient (> 24 years old) will complete five times sit to stand test in < 15 seconds indicating an increased LE strength and improved balance.    Baseline  11/6: 48 seconds 12/17: 40 seconds 1/2: 38 seconds with excessive UE support     Time  8    Period  Weeks    Status  Partially Met      PT LONG TERM GOAL #2    Title  Patient will increase 10 meter walk test to >1.52ms as to improve gait speed for better community ambulation and to reduce fall risk.    Baseline  11/6: .368m  12/17: .45 m/s 1/2: .24ms     Time  8    Period  Weeks    Status  Partially Met      PT LONG TERM GOAL #3   Title  Patient will reduce timed up and go to <11 seconds to reduce fall risk and demonstrate improved transfer/gait ability.    Baseline  11/6: 41 seconds 12/17: 33 seconds 1/2: 29 seconds     Time  8    Period  Weeks    Status  Partially Met      PT LONG TERM GOAL #4   Title  Patient will increase lower extremity functional scale to >60/80 to demonstrate improved functional mobility and increased tolerance with ADLs    Baseline  11/6: 48/80  12/17: 59/80     Time  8    Period  Weeks    Status  Partially Met      PT LONG TERM GOAL #5   Title  Patient will increase BLE gross strength to 4+/5 as to improve functional strength for independent gait, increased standing tolerance and increased ADL ability.    Baseline  R gross 3-/5 L 4/5    Time  8    Period  Weeks    Status  New    Target Date  03/10/17            Plan - 02/23/17 1016    Clinical Impression Statement  Patient presents with increased R hip stiffness but responds well to manual therapy and stretching. Patient demonstrates within-sessions improvements in upright posture but struggles to maintain improvement between-sessions. Gains in hip and core strength demonstrated with increasingly fluid STS from lower surfaces than prior and improvements in gait and balance during exercise. Patient will continue to benefit from skilled physical therapy for improved strength, posture, and reduced pain.    Rehab Potential  Fair    Clinical Impairments Affecting Rehab Potential  (-) R hip sx complications, consistant decline in function and mobility; (+) desire for progress,     PT Frequency  2x / week    PT Duration  8 weeks    PT Treatment/Interventions   ADLs/Self Care Home Management;Aquatic Therapy;Cryotherapy;Electrical Stimulation;Ultrasound;Traction;Moist Heat;Iontophoresis 445mml Dexamethasone;DME Instruction;Gait training;Stair training;Functional mobility training;Therapeutic activities;Therapeutic exercise;Patient/family education;Neuromuscular re-education;Balance training;Manual techniques;Taping;Energy conservation;Passive range of motion    PT Next Visit Plan  R hip flexors and lateral muscualture strength , strengthen R adductors,     PT Home Exercise Plan  see sheet    Consulted and Agree with Plan of Care  Patient       Patient will benefit from skilled therapeutic intervention in order to improve the following deficits and impairments:  Abnormal gait, Decreased knowledge of use of DME, Decreased coordination, Decreased activity tolerance, Decreased balance, Decreased endurance, Decreased range of motion, Decreased safety awareness, Decreased mobility, Decreased strength, Difficulty walking, Impaired flexibility, Impaired perceived functional ability, Increased muscle spasms, Hypomobility, Postural dysfunction, Improper body mechanics, Pain  Visit Diagnosis: Muscle weakness (generalized)  Stiffness of right knee, not elsewhere classified  Other abnormalities of gait and mobility  Stiffness of right hip, not elsewhere classified     Problem List Patient Active Problem List   Diagnosis Date Noted  . COPD (chronic obstructive pulmonary disease) ([place]/31/2018  . Primary localized osteoarthritis of right hip 07/21/2016     RoVirgia LandSPT 02/23/2017, 10:36 AM This entire session was performed under direct supervision and direction of a licensed therapist/therapist assistant . I have personally read,  edited and approve of the note as written. Janna Arch, PT, DPT   [place] MAIN [facility] SERVICES [address] [place], [place], [address] Phone: [phone]   Fax:   [phone]  Name: TESHA ARCHAMBEAU MRN: [id number] Date of Birth: 02/22/1940

## 2017-02-25 ENCOUNTER — Ambulatory Visit: Payer: Medicare HMO | Admitting: Physical Therapy

## 2017-02-25 ENCOUNTER — Encounter: Payer: Self-pay | Admitting: Physical Therapy

## 2017-02-25 DIAGNOSIS — M6281 Muscle weakness (generalized): Secondary | ICD-10-CM | POA: Diagnosis not present

## 2017-02-25 DIAGNOSIS — M25661 Stiffness of right knee, not elsewhere classified: Secondary | ICD-10-CM | POA: Diagnosis not present

## 2017-02-25 DIAGNOSIS — R2689 Other abnormalities of gait and mobility: Secondary | ICD-10-CM | POA: Diagnosis not present

## 2017-02-25 DIAGNOSIS — M25651 Stiffness of right hip, not elsewhere classified: Secondary | ICD-10-CM | POA: Diagnosis not present

## 2017-02-25 NOTE — Therapy (Signed)
[place] MAIN [facility] SERVICES [address] [place], [place], [address] Phone: [phone]   Fax:  [phone]  Physical Therapy Treatment  Patient Details  Name: Mackenzie Hill MRN: [id number] Date of Birth: 15-Dec-1940 Referring Provider: Damaris Hippo, PA   Encounter Date: 02/25/2017  PT End of Session - 02/25/17 0922    Visit Number  26    Number of Visits  32    Date for PT Re-Evaluation  03/10/17    PT Start Time  0830    PT Stop Time  0915    PT Time Calculation (min)  45 min    Equipment Utilized During Treatment  Gait belt    Activity Tolerance  Patient tolerated treatment well;Patient limited by fatigue;No increased pain    Behavior During Therapy  WFL for tasks assessed/performed       Past Medical History:  Diagnosis Date  . Anemia   . Anemia, unspecified    Hgb 11.8 - 04/2013  . Arthritis   . Cataract    Bilateral  . CHF (congestive heart failure) ([place])   . Chronic kidney disease   . COPD (chronic obstructive pulmonary disease) ([place])   . DDD (degenerative disc disease), lumbar   . Degenerative arthritis   . Diabetes mellitus type 2, uncomplicated (HCC)    AODM (A1C 6.9%) 04/2013  . Diabetes mellitus without complication ([place])   . Dyspnea    with exertion  . Esophagitis   . Gout    Uric acid 5.4 - 08/2012  . Hypertension   . Obesity, unspecified   . Sarcoidosis, lung ([place])    reported by pt, Clinically without a biopsy  . Sleep apnea    OSA--Use C-PAP    Past Surgical History:  Procedure Laterality Date  . ABDOMINAL HYSTERECTOMY     age 77  . COLONOSCOPY  11/02/2005   Hyperplastic Polyp: CBF 10/2015; Recall Ltr mailed 08/27/2015 (dw)  . FOOT SURGERY Right   . HALLUX VALGUS REPAIR Left   . OOPHORECTOMY    . 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  Subjective Assessment - 02/25/17 0920    Subjective  Patient reports that hip is feeling less stiff. Still stiff in the mornings when getting out of bed. Reports that warmer weather has helped.    Pertinent History  Patient presents to physical therapy evaluation with SPC. Patient had R hip surgery on July 10th and ever since the surgery she has had difficulty bending her R knee. R knee is difficult to move, can't sit up and down without extreme difficulty. Had a cortisone shot in knees in August. 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     Limitations  Sitting;Standing;Walking;House hold activities;Other (comment)    How long can you sit comfortably?  20 minutes    How long can you stand comfortably?  20 minutes    How long can you walk comfortably?  20 minutes    Patient Stated Goals  sit down and get up easier.     Currently in Pain?  No/denies    Pain Score  0-No pain    Multiple Pain Sites  No         TREATMENT  Manual therapy: Prone: Long arc distraction RLE hip 4  x 50 sec oscillations, 10 sec rest, with increasing angles of abduction  RLE lateral hip mobilization grade II-III with internal rotation moment to correct hip external rotation at rest 3 x 30 sec bouts at each grade Stretch into internal rotation 3 x 30 sec    Supine: RLE anterior hip mobilization grades III-IV 3 bouts of 30 seconds at each grade  Patient demonstrates improved upright posture with increased RLE hip extension and reduced external rotation in gait following manual therapy.  Bed mobility: Sitting EOB to supine x 1, patient requires supervision Supine to sitting EOB to prone x 1, patient requires supervision   Therapeutic exercise: Parallel bars:  Sidestepping x 5 lengths no UE support, cues for upright posture; patient demonstrates collapse on medial Weight shifts with hip extension x 10 with cues for foot alignment (avoiding external  rotation of LLE) Ambulating backwards   Airex pad: Standing balance x 10 sec without UE assist no LOB  Lateral weight shift x 10 BLE no LOB without UE assist; patient demonstrates good hip stability with weight shifting Mini marches (picking up foot 1 sec hold) x 10 BLE, patient able to accomplish without UE support but requires CGA.  Patient demonstrates increased inversion of RLE ankle and external rotation of RLE hip when balance is challenged but is able to correct with cues.   Parallel bars: Step-ups to 4-in step RLE/LLE leading x 10 each to challenge functional extension and stability of BLE hips, Patient able to maintain upright posture and parallel foot position with therapist cues and BUE support; challenged with eccentric lowering. Step over  foam for increased stride length BLE no UE support, patient requires CGA Side stepping in parallel bars x 3 lengths, patient demonstrates collapse through medial arch of RLE foot, worse with fatigue.  Seated: Adduction ball squeezes x 15 cues for foot placement for improved adductor muscle activation Adduction ball squeezes with LAQ x 10, min cues for full extension  Parallel bars: Ambulating backwards x 6 lengths of parallel bars to challenge functional extension and balance, no UE support with cues, requires CGA  Seated: STS (sit to stand) from raised surface x 5 UE support, therapist cues for foot position (bringing feet back, pushing through heels) STS (sit to stand) from raised surface x 1 no UE support  Patient tolerated treatment well reporting fatigue but no increased pain.    PT Education - 02/25/17 [phone]    Education provided  Yes    Education Details  R hip mobility, BLE strengthening/balance    Person(s) Educated  Patient    Methods  Explanation;Demonstration;Verbal cues    Comprehension  Verbalized understanding;Returned demonstration;Verbal cues required       PT Short Term Goals - 01/13/17 0946      PT SHORT TERM  GOAL #1   Title  Patient will perform three five consecutive sit to stands in a row without a break demonstrating increased functional strength.     Baseline  requires excessive UE assist for 5     Time  2    Period  Weeks    Status  Partially Met      PT SHORT TERM GOAL #2   Title  Patient will be independent in home exercise program to improve strength/mobility for better functional independence with ADLs.    Baseline  Hep compliance    Time  2    Period  Weeks    Status  Achieved      PT SHORT TERM GOAL #  3   Title  Patient will be modified independent in walking on even/uneven surface with least restrictive assistive device, for 10+ minutes without rest break, reporting some difficulty or less to improve walking tolerance with community ambulation including grocery shopping, going to church,etc    Baseline  ambulate 3 minutes and 35 seconds without rest break    Time  2    Period  Weeks    Status  Partially Met        PT Long Term Goals - 01/13/17 3716      PT LONG TERM GOAL #1   Title  Patient (> 4 years old) will complete five times sit to stand test in < 15 seconds indicating an increased LE strength and improved balance.    Baseline  11/6: 48 seconds 12/17: 40 seconds 1/2: 38 seconds with excessive UE support     Time  8    Period  Weeks    Status  Partially Met      PT LONG TERM GOAL #2   Title  Patient will increase 10 meter walk test to >1.12ms as to improve gait speed for better community ambulation and to reduce fall risk.    Baseline  11/6: .311m 12/17: .45 m/s 1/2: .63m36m    Time  8    Period  Weeks    Status  Partially Met      PT LONG TERM GOAL #3   Title  Patient will reduce timed up and go to <11 seconds to reduce fall risk and demonstrate improved transfer/gait ability.    Baseline  11/6: 41 seconds 12/17: 33 seconds 1/2: 29 seconds     Time  8    Period  Weeks    Status  Partially Met      PT LONG TERM GOAL #4   Title  Patient will increase lower  extremity functional scale to >60/80 to demonstrate improved functional mobility and increased tolerance with ADLs    Baseline  11/6: 48/80  12/17: 59/80     Time  8    Period  Weeks    Status  Partially Met      PT LONG TERM GOAL #5   Title  Patient will increase BLE gross strength to 4+/5 as to improve functional strength for independent gait, increased standing tolerance and increased ADL ability.    Baseline  R gross 3-/5 L 4/5    Time  8    Period  Weeks    Status  New    Target Date  03/10/17            Plan - 02/25/17 0931    Clinical Impression Statement  Patient demonstrates improved strength and functional capacity tolerating multiple exercises in standing without requiring rest. Posture improved with greatly reduced forward flexed posture with standing exercises in parallel bars but patient struggles to maintain upright posture when challenged. Patient demonstrates external rotation and collapse through medial arch on RLE foot with balance exercises and side-stepping. Demonstrates improvements in RLE hip ext. ROM. Patient will continue to benefit from skilled physical therapy for improved strength, posture, and reduced pain.    Rehab Potential  Fair    Clinical Impairments Affecting Rehab Potential  (-) R hip sx complications, consistant decline in function and mobility; (+) desire for progress,     PT Frequency  2x / week    PT Duration  8 weeks    PT Treatment/Interventions  ADLs/Self Care Home Management;Aquatic Therapy;Cryotherapy;EleDealer  Stimulation;Ultrasound;Traction;Moist Heat;Iontophoresis 24m/ml Dexamethasone;DME Instruction;Gait training;Stair training;Functional mobility training;Therapeutic activities;Therapeutic exercise;Patient/family education;Neuromuscular re-education;Balance training;Manual techniques;Taping;Energy conservation;Passive range of motion    PT Next Visit Plan  R hip flexors and lateral muscualture strength , strengthen R adductors,     PT  Home Exercise Plan  see sheet    Consulted and Agree with Plan of Care  Patient       Patient will benefit from skilled therapeutic intervention in order to improve the following deficits and impairments:  Abnormal gait, Decreased knowledge of use of DME, Decreased coordination, Decreased activity tolerance, Decreased balance, Decreased endurance, Decreased range of motion, Decreased safety awareness, Decreased mobility, Decreased strength, Difficulty walking, Impaired flexibility, Impaired perceived functional ability, Increased muscle spasms, Hypomobility, Postural dysfunction, Improper body mechanics, Pain  Visit Diagnosis: Muscle weakness (generalized)  Stiffness of right knee, not elsewhere classified  Other abnormalities of gait and mobility  Stiffness of right hip, not elsewhere classified     Problem List Patient Active Problem List   Diagnosis Date Noted  . COPD (chronic obstructive pulmonary disease) ([place] 08/11/2016  . Primary localized osteoarthritis of right hip 07/21/2016   This entire session was performed under direct supervision and direction of a licensed therapist/therapist assistant . I have personally read, edited and approve of the note as written. MHillis Range PT, DPT 02/25/17 12:40 PM   RVirgia Land SPT 02/25/2017, 12:37 PM  [place] [facility] [address] [address]Phone: [phone]  Fax:  [phone] Name: Mackenzie SHINNMRN: [id number] of Birth: [birth year]

## 2017-03-02 ENCOUNTER — Ambulatory Visit: Payer: Medicare HMO

## 2017-03-02 DIAGNOSIS — M25651 Stiffness of right hip, not elsewhere classified: Secondary | ICD-10-CM | POA: Diagnosis not present

## 2017-03-02 DIAGNOSIS — M25661 Stiffness of right knee, not elsewhere classified: Secondary | ICD-10-CM

## 2017-03-02 DIAGNOSIS — R2689 Other abnormalities of gait and mobility: Secondary | ICD-10-CM

## 2017-03-02 DIAGNOSIS — M6281 Muscle weakness (generalized): Secondary | ICD-10-CM

## 2017-03-02 NOTE — Therapy (Signed)
[place] MAIN [facility] SERVICES [address] [place], [place], [address] Phone: [phone]   Fax:  [phone]  Physical Therapy Treatment  Patient Details  Name: Mackenzie Hill MRN: [id number] Date of Birth: 1940/08/10 Referring Provider: Damaris Hippo, PA   Encounter Date: 03/02/2017  PT End of Session - 03/02/17 0826    Visit Number  27    Number of Visits  32    Date for PT Re-Evaluation  03/10/17    PT Start Time  0828    PT Stop Time  0913    PT Time Calculation (min)  45 min    Equipment Utilized During Treatment  Gait belt    Activity Tolerance  No increased pain;Patient limited by fatigue    Behavior During Therapy  [facility] for tasks assessed/performed       Past Medical History:  Diagnosis Date  . Anemia   . Anemia, unspecified    Hgb 11.8 - 04/2013  . Arthritis   . Cataract    Bilateral  . CHF (congestive heart failure) ([place])   . Chronic kidney disease   . COPD (chronic obstructive pulmonary disease) ([place])   . DDD (degenerative disc disease), lumbar   . Degenerative arthritis   . Diabetes mellitus type 2, uncomplicated (HCC)    AODM (A1C 6.9%) 04/2013  . Diabetes mellitus without complication ([place])   . Dyspnea    with exertion  . Esophagitis   . Gout    Uric acid 5.4 - 08/2012  . Hypertension   . Obesity, unspecified   . Sarcoidosis, lung ([place])    reported by pt, Clinically without a biopsy  . Sleep apnea    OSA--Use C-PAP    Past Surgical History:  Procedure Laterality Date  . ABDOMINAL HYSTERECTOMY     age 33  . COLONOSCOPY  11/02/2005   Hyperplastic Polyp: CBF 10/2015; Recall Ltr mailed 08/27/2015 (dw)  . FOOT SURGERY Right   . HALLUX VALGUS REPAIR Left   . OOPHORECTOMY    . 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  Subjective Assessment - 03/02/17 0833    Subjective  Patient reports that she had a quiet weekend. Reports to PT short of breath, says this is due to COPD. Reports that the leg was sore after last session and was catching.    Pertinent History  Patient presents to physical therapy evaluation with SPC. Patient had R hip surgery on July 10th and ever since the surgery she has had difficulty bending her R knee. R knee is difficult to move, can't sit up and down without extreme difficulty. Had a cortisone shot in knees in August. 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     Limitations  Sitting;Standing;Walking;House hold activities;Other (comment)    How long can you sit comfortably?  20 minutes    How long can you stand comfortably?  20 minutes    How long can you walk comfortably?  20 minutes    Patient Stated Goals  sit down and get up easier.     Currently in Pain?  No/denies    Pain Score  0-No pain    Multiple Pain Sites  No       TREATMENT    Nu-step lv 2 x  4 min  Manual therapy: Prone: Long arc distraction RLE hip 6 x 40 sec oscillations, 10 sec rest, with increasing angles of abduction  RLE lateral hip mobilization grade II-III with internal rotation moment to correct hip external rotation at rest 3 x 30 sec bouts at each grade Stretch into internal rotation 3 x 30 sec  Internal rotation ball squeeze 3 sec hold x 10   Supine: RLE anterior hip mobilization grades II-III 3 bouts of 30 seconds at each grade   Bed mobility:  Sitting EOB to supine x 1 Supine to sitting EOB to prone x 1 Prone to side-lying to sitting EOB x1   Patient demonstrates independence with bed mobility tasks.   Therapeutic exercise: Parallel bars: Sidestepping RLE to mark on floor with cues for maintaining neutral rotation of RLE x10 Step-ups to 4-in step RLE/LLE leading x 10 each to challenge functional extension and stability of BLE hips, cues (in mirror) for  neutral foot alignment x 15; patient able to correct with cues Mini marches 1 sec hold x 10 BLE cues for foot alignment  Airex pad: LLE heel raises to challenge weight acceptance onto RLE foot 3 x 10; cues for neutral foot alignment  Patient demonstrated increased fatigue, reduced weight acceptance with internal rotation on RLE.  Patient response to medical necessity: Patient will continue to benefit from skilled physical therapy for improved strength, ROM, and functional mobility.      PT Education - 03/02/17 0826    Education provided  Yes    Education Details  R hip mobility, BLE strengthening/balance    Person(s) Educated  Patient    Methods  Explanation;Demonstration;Verbal cues    Comprehension  Verbalized understanding;Returned demonstration;Verbal cues required       PT Short Term Goals - 01/13/17 0946      PT SHORT TERM GOAL #1   Title  Patient will perform three five consecutive sit to stands in a row without a break demonstrating increased functional strength.     Baseline  requires excessive UE assist for 5     Time  2    Period  Weeks    Status  Partially Met      PT SHORT TERM GOAL #2   Title  Patient will be independent in home exercise program to improve strength/mobility for better functional independence with ADLs.    Baseline  Hep compliance    Time  2    Period  Weeks    Status  Achieved      PT SHORT TERM GOAL #3   Title  Patient will be modified independent in walking on even/uneven surface with least restrictive assistive device, for 10+ minutes without rest break, reporting some difficulty or less to improve walking tolerance with community ambulation including grocery shopping, going to church,etc    Baseline  ambulate 3 minutes and 35 seconds without rest break    Time  2    Period  Weeks    Status  Partially Met        PT Long Term Goals - 01/13/17 3710      PT LONG TERM GOAL #1   Title  Patient (> 39 years old) will complete five times  sit to stand test in < 15 seconds indicating an increased LE strength and improved balance.    Baseline  11/6: 48 seconds 12/17: 40 seconds 1/2: 38 seconds with excessive UE support     Time  8    Period  Weeks  Status  Partially Met      PT LONG TERM GOAL #2   Title  Patient will increase 10 meter walk test to >1.54ms as to improve gait speed for better community ambulation and to reduce fall risk.    Baseline  11/6: .310m 12/17: .45 m/s 1/2: .41m62m    Time  8    Period  Weeks    Status  Partially Met      PT LONG TERM GOAL #3   Title  Patient will reduce timed up and go to <11 seconds to reduce fall risk and demonstrate improved transfer/gait ability.    Baseline  11/6: 41 seconds 12/17: 33 seconds 1/2: 29 seconds     Time  8    Period  Weeks    Status  Partially Met      PT LONG TERM GOAL #4   Title  Patient will increase lower extremity functional scale to >60/80 to demonstrate improved functional mobility and increased tolerance with ADLs    Baseline  11/6: 48/80  12/17: 59/80     Time  8    Period  Weeks    Status  Partially Met      PT LONG TERM GOAL #5   Title  Patient will increase BLE gross strength to 4+/5 as to improve functional strength for independent gait, increased standing tolerance and increased ADL ability.    Baseline  R gross 3-/5 L 4/5    Time  8    Period  Weeks    Status  New    Target Date  03/10/17            Plan - 03/02/17 1622    Clinical Impression Statement  Patient presented with increased stiffness in RLE hip requiring additional manual therapy; patient report suggests that last session may have been too demanding. Demonstrates independence with bed mobility but easily fatigued today with standing exercises. Demonstrates external rotation in RLE at rest, but able to bring to neutral with cueing. Demonstrates increased apprehension with balance activities with foot in neutral requiring UE assist. Patient will benefit from skilled physical  therapy for improved strength, posture, and reduced pain.    Rehab Potential  Fair    Clinical Impairments Affecting Rehab Potential  (-) R hip sx complications, consistant decline in function and mobility; (+) desire for progress,     PT Frequency  2x / week    PT Duration  8 weeks    PT Treatment/Interventions  ADLs/Self Care Home Management;Aquatic Therapy;Cryotherapy;Electrical Stimulation;Ultrasound;Traction;Moist Heat;Iontophoresis 4mg1m Dexamethasone;DME Instruction;Gait training;Stair training;Functional mobility training;Therapeutic activities;Therapeutic exercise;Patient/family education;Neuromuscular re-education;Balance training;Manual techniques;Taping;Energy conservation;Passive range of motion    PT Next Visit Plan  R hip flexors and lateral muscualture strength , strengthen R adductors,     PT Home Exercise Plan  see sheet    Consulted and Agree with Plan of Care  Patient       Patient will benefit from skilled therapeutic intervention in order to improve the following deficits and impairments:  Abnormal gait, Decreased knowledge of use of DME, Decreased coordination, Decreased activity tolerance, Decreased balance, Decreased endurance, Decreased range of motion, Decreased safety awareness, Decreased mobility, Decreased strength, Difficulty walking, Impaired flexibility, Impaired perceived functional ability, Increased muscle spasms, Hypomobility, Postural dysfunction, Improper body mechanics, Pain  Visit Diagnosis: Muscle weakness (generalized)  Stiffness of right knee, not elsewhere classified  Other abnormalities of gait and mobility  Stiffness of right hip, not elsewhere classified     Problem List  Patient Active Problem List   Diagnosis Date Noted  . COPD (chronic obstructive pulmonary disease) ([place]) 08/11/2016  . Primary localized osteoarthritis of right hip 07/21/2016   Virgia Land, SPT 03/02/2017, 4:36 PM This entire session was performed under direct  supervision and direction of a licensed therapist/therapist assistant . I have personally read, edited and approve of the note as written. Janna Arch, PT, DPT   [place] MAIN [facility] SERVICES [address] [place], [place], [address] Phone: [phone]   Fax:  [phone]  Name: Mackenzie Hill MRN: [id number] Date of Birth: Mar 31, 1940

## 2017-03-16 ENCOUNTER — Ambulatory Visit: Payer: Medicare HMO

## 2017-03-17 DIAGNOSIS — M25551 Pain in right hip: Secondary | ICD-10-CM | POA: Diagnosis not present

## 2017-03-29 DIAGNOSIS — Z96641 Presence of right artificial hip joint: Secondary | ICD-10-CM | POA: Diagnosis not present

## 2017-03-29 DIAGNOSIS — R29898 Other symptoms and signs involving the musculoskeletal system: Secondary | ICD-10-CM | POA: Diagnosis not present

## 2017-03-31 DIAGNOSIS — J431 Panlobular emphysema: Secondary | ICD-10-CM | POA: Diagnosis not present

## 2017-03-31 DIAGNOSIS — G4733 Obstructive sleep apnea (adult) (pediatric): Secondary | ICD-10-CM | POA: Diagnosis not present

## 2017-03-31 DIAGNOSIS — M1A00X0 Idiopathic chronic gout, unspecified site, without tophus (tophi): Secondary | ICD-10-CM | POA: Diagnosis not present

## 2017-03-31 DIAGNOSIS — N184 Chronic kidney disease, stage 4 (severe): Secondary | ICD-10-CM | POA: Diagnosis not present

## 2017-03-31 DIAGNOSIS — Z794 Long term (current) use of insulin: Secondary | ICD-10-CM | POA: Diagnosis not present

## 2017-03-31 DIAGNOSIS — Z79899 Other long term (current) drug therapy: Secondary | ICD-10-CM | POA: Diagnosis not present

## 2017-03-31 DIAGNOSIS — Z0000 Encounter for general adult medical examination without abnormal findings: Secondary | ICD-10-CM | POA: Diagnosis not present

## 2017-03-31 DIAGNOSIS — E1121 Type 2 diabetes mellitus with diabetic nephropathy: Secondary | ICD-10-CM | POA: Diagnosis not present

## 2017-03-31 DIAGNOSIS — I10 Essential (primary) hypertension: Secondary | ICD-10-CM | POA: Diagnosis not present

## 2017-03-31 DIAGNOSIS — D539 Nutritional anemia, unspecified: Secondary | ICD-10-CM | POA: Diagnosis not present

## 2017-04-22 DIAGNOSIS — Z1231 Encounter for screening mammogram for malignant neoplasm of breast: Secondary | ICD-10-CM | POA: Diagnosis not present

## 2017-04-26 DIAGNOSIS — D631 Anemia in chronic kidney disease: Secondary | ICD-10-CM | POA: Diagnosis not present

## 2017-04-26 DIAGNOSIS — R809 Proteinuria, unspecified: Secondary | ICD-10-CM | POA: Diagnosis not present

## 2017-04-26 DIAGNOSIS — M109 Gout, unspecified: Secondary | ICD-10-CM | POA: Diagnosis not present

## 2017-04-26 DIAGNOSIS — I129 Hypertensive chronic kidney disease with stage 1 through stage 4 chronic kidney disease, or unspecified chronic kidney disease: Secondary | ICD-10-CM | POA: Diagnosis not present

## 2017-04-26 DIAGNOSIS — N2581 Secondary hyperparathyroidism of renal origin: Secondary | ICD-10-CM | POA: Diagnosis not present

## 2017-04-26 DIAGNOSIS — E1122 Type 2 diabetes mellitus with diabetic chronic kidney disease: Secondary | ICD-10-CM | POA: Diagnosis not present

## 2017-04-26 DIAGNOSIS — N184 Chronic kidney disease, stage 4 (severe): Secondary | ICD-10-CM | POA: Diagnosis not present

## 2017-05-30 DIAGNOSIS — G4733 Obstructive sleep apnea (adult) (pediatric): Secondary | ICD-10-CM | POA: Diagnosis not present

## 2017-07-07 DIAGNOSIS — M150 Primary generalized (osteo)arthritis: Secondary | ICD-10-CM | POA: Diagnosis not present

## 2017-07-07 DIAGNOSIS — E119 Type 2 diabetes mellitus without complications: Secondary | ICD-10-CM | POA: Diagnosis not present

## 2017-07-07 DIAGNOSIS — R5381 Other malaise: Secondary | ICD-10-CM | POA: Diagnosis not present

## 2017-07-07 DIAGNOSIS — I10 Essential (primary) hypertension: Secondary | ICD-10-CM | POA: Diagnosis not present

## 2017-07-07 DIAGNOSIS — D539 Nutritional anemia, unspecified: Secondary | ICD-10-CM | POA: Diagnosis not present

## 2017-07-07 DIAGNOSIS — R5383 Other fatigue: Secondary | ICD-10-CM | POA: Diagnosis not present

## 2017-07-07 DIAGNOSIS — N184 Chronic kidney disease, stage 4 (severe): Secondary | ICD-10-CM | POA: Diagnosis not present

## 2017-07-07 DIAGNOSIS — Z794 Long term (current) use of insulin: Secondary | ICD-10-CM | POA: Diagnosis not present

## 2017-07-07 DIAGNOSIS — Z79899 Other long term (current) drug therapy: Secondary | ICD-10-CM | POA: Diagnosis not present

## 2017-08-18 DIAGNOSIS — M6149 Other calcification of muscle, multiple sites: Secondary | ICD-10-CM | POA: Diagnosis not present

## 2017-08-18 DIAGNOSIS — Z96641 Presence of right artificial hip joint: Secondary | ICD-10-CM | POA: Diagnosis not present

## 2017-09-02 DIAGNOSIS — N183 Chronic kidney disease, stage 3 (moderate): Secondary | ICD-10-CM | POA: Diagnosis not present

## 2017-09-02 DIAGNOSIS — M1A00X0 Idiopathic chronic gout, unspecified site, without tophus (tophi): Secondary | ICD-10-CM | POA: Diagnosis not present

## 2017-09-23 DIAGNOSIS — H25013 Cortical age-related cataract, bilateral: Secondary | ICD-10-CM | POA: Diagnosis not present

## 2017-09-23 DIAGNOSIS — E119 Type 2 diabetes mellitus without complications: Secondary | ICD-10-CM | POA: Diagnosis not present

## 2017-09-23 DIAGNOSIS — H401131 Primary open-angle glaucoma, bilateral, mild stage: Secondary | ICD-10-CM | POA: Diagnosis not present

## 2017-09-23 DIAGNOSIS — H2513 Age-related nuclear cataract, bilateral: Secondary | ICD-10-CM | POA: Diagnosis not present

## 2017-09-23 DIAGNOSIS — H43812 Vitreous degeneration, left eye: Secondary | ICD-10-CM | POA: Diagnosis not present

## 2017-09-27 DIAGNOSIS — J449 Chronic obstructive pulmonary disease, unspecified: Secondary | ICD-10-CM | POA: Diagnosis not present

## 2017-09-27 DIAGNOSIS — Z794 Long term (current) use of insulin: Secondary | ICD-10-CM | POA: Diagnosis not present

## 2017-09-27 DIAGNOSIS — I10 Essential (primary) hypertension: Secondary | ICD-10-CM | POA: Diagnosis not present

## 2017-09-27 DIAGNOSIS — D539 Nutritional anemia, unspecified: Secondary | ICD-10-CM | POA: Diagnosis not present

## 2017-09-27 DIAGNOSIS — J431 Panlobular emphysema: Secondary | ICD-10-CM | POA: Diagnosis not present

## 2017-09-27 DIAGNOSIS — E119 Type 2 diabetes mellitus without complications: Secondary | ICD-10-CM | POA: Diagnosis not present

## 2017-10-15 DIAGNOSIS — Z23 Encounter for immunization: Secondary | ICD-10-CM | POA: Diagnosis not present

## 2017-11-01 DIAGNOSIS — N2581 Secondary hyperparathyroidism of renal origin: Secondary | ICD-10-CM | POA: Diagnosis not present

## 2017-11-01 DIAGNOSIS — D631 Anemia in chronic kidney disease: Secondary | ICD-10-CM | POA: Diagnosis not present

## 2017-11-01 DIAGNOSIS — E1122 Type 2 diabetes mellitus with diabetic chronic kidney disease: Secondary | ICD-10-CM | POA: Diagnosis not present

## 2017-11-01 DIAGNOSIS — I129 Hypertensive chronic kidney disease with stage 1 through stage 4 chronic kidney disease, or unspecified chronic kidney disease: Secondary | ICD-10-CM | POA: Diagnosis not present

## 2017-11-01 DIAGNOSIS — R809 Proteinuria, unspecified: Secondary | ICD-10-CM | POA: Diagnosis not present

## 2017-11-01 DIAGNOSIS — N183 Chronic kidney disease, stage 3 (moderate): Secondary | ICD-10-CM | POA: Diagnosis not present

## 2017-12-24 DIAGNOSIS — E119 Type 2 diabetes mellitus without complications: Secondary | ICD-10-CM | POA: Diagnosis not present

## 2017-12-24 DIAGNOSIS — H401131 Primary open-angle glaucoma, bilateral, mild stage: Secondary | ICD-10-CM | POA: Diagnosis not present

## 2017-12-24 DIAGNOSIS — H43812 Vitreous degeneration, left eye: Secondary | ICD-10-CM | POA: Diagnosis not present

## 2017-12-24 DIAGNOSIS — H2513 Age-related nuclear cataract, bilateral: Secondary | ICD-10-CM | POA: Diagnosis not present

## 2017-12-24 DIAGNOSIS — H25013 Cortical age-related cataract, bilateral: Secondary | ICD-10-CM | POA: Diagnosis not present

## 2017-12-30 DIAGNOSIS — Z79899 Other long term (current) drug therapy: Secondary | ICD-10-CM | POA: Diagnosis not present

## 2017-12-30 DIAGNOSIS — E1121 Type 2 diabetes mellitus with diabetic nephropathy: Secondary | ICD-10-CM | POA: Diagnosis not present

## 2017-12-30 DIAGNOSIS — G4733 Obstructive sleep apnea (adult) (pediatric): Secondary | ICD-10-CM | POA: Diagnosis not present

## 2017-12-30 DIAGNOSIS — E119 Type 2 diabetes mellitus without complications: Secondary | ICD-10-CM | POA: Diagnosis not present

## 2017-12-30 DIAGNOSIS — Z1211 Encounter for screening for malignant neoplasm of colon: Secondary | ICD-10-CM | POA: Diagnosis not present

## 2017-12-30 DIAGNOSIS — N183 Chronic kidney disease, stage 3 (moderate): Secondary | ICD-10-CM | POA: Diagnosis not present

## 2017-12-30 DIAGNOSIS — I10 Essential (primary) hypertension: Secondary | ICD-10-CM | POA: Diagnosis not present

## 2017-12-30 DIAGNOSIS — Z794 Long term (current) use of insulin: Secondary | ICD-10-CM | POA: Diagnosis not present

## 2017-12-30 DIAGNOSIS — D539 Nutritional anemia, unspecified: Secondary | ICD-10-CM | POA: Diagnosis not present

## 2018-06-23 DIAGNOSIS — E119 Type 2 diabetes mellitus without complications: Secondary | ICD-10-CM | POA: Diagnosis not present

## 2018-06-23 DIAGNOSIS — H401131 Primary open-angle glaucoma, bilateral, mild stage: Secondary | ICD-10-CM | POA: Diagnosis not present

## 2018-06-23 DIAGNOSIS — H25013 Cortical age-related cataract, bilateral: Secondary | ICD-10-CM | POA: Diagnosis not present

## 2018-06-23 DIAGNOSIS — H2513 Age-related nuclear cataract, bilateral: Secondary | ICD-10-CM | POA: Diagnosis not present

## 2018-06-23 DIAGNOSIS — H43812 Vitreous degeneration, left eye: Secondary | ICD-10-CM | POA: Diagnosis not present

## 2018-07-04 DIAGNOSIS — I129 Hypertensive chronic kidney disease with stage 1 through stage 4 chronic kidney disease, or unspecified chronic kidney disease: Secondary | ICD-10-CM | POA: Diagnosis not present

## 2018-07-04 DIAGNOSIS — N390 Urinary tract infection, site not specified: Secondary | ICD-10-CM | POA: Diagnosis not present

## 2018-07-04 DIAGNOSIS — E1122 Type 2 diabetes mellitus with diabetic chronic kidney disease: Secondary | ICD-10-CM | POA: Diagnosis not present

## 2018-07-04 DIAGNOSIS — N183 Chronic kidney disease, stage 3 (moderate): Secondary | ICD-10-CM | POA: Diagnosis not present

## 2018-07-13 DIAGNOSIS — Z0000 Encounter for general adult medical examination without abnormal findings: Secondary | ICD-10-CM | POA: Diagnosis not present

## 2018-07-13 DIAGNOSIS — N183 Chronic kidney disease, stage 3 (moderate): Secondary | ICD-10-CM | POA: Diagnosis not present

## 2018-07-13 DIAGNOSIS — M1A00X0 Idiopathic chronic gout, unspecified site, without tophus (tophi): Secondary | ICD-10-CM | POA: Diagnosis not present

## 2018-07-13 DIAGNOSIS — D539 Nutritional anemia, unspecified: Secondary | ICD-10-CM | POA: Diagnosis not present

## 2018-07-13 DIAGNOSIS — I10 Essential (primary) hypertension: Secondary | ICD-10-CM | POA: Diagnosis not present

## 2018-07-13 DIAGNOSIS — E1121 Type 2 diabetes mellitus with diabetic nephropathy: Secondary | ICD-10-CM | POA: Diagnosis not present

## 2018-07-13 DIAGNOSIS — Z13220 Encounter for screening for lipoid disorders: Secondary | ICD-10-CM | POA: Diagnosis not present

## 2018-07-13 DIAGNOSIS — Z79899 Other long term (current) drug therapy: Secondary | ICD-10-CM | POA: Diagnosis not present

## 2018-07-19 DIAGNOSIS — Z1231 Encounter for screening mammogram for malignant neoplasm of breast: Secondary | ICD-10-CM | POA: Diagnosis not present

## 2018-08-04 DIAGNOSIS — M1A00X0 Idiopathic chronic gout, unspecified site, without tophus (tophi): Secondary | ICD-10-CM | POA: Diagnosis not present

## 2018-08-04 DIAGNOSIS — N183 Chronic kidney disease, stage 3 (moderate): Secondary | ICD-10-CM | POA: Diagnosis not present

## 2018-09-22 DIAGNOSIS — H43812 Vitreous degeneration, left eye: Secondary | ICD-10-CM | POA: Diagnosis not present

## 2018-09-22 DIAGNOSIS — H25013 Cortical age-related cataract, bilateral: Secondary | ICD-10-CM | POA: Diagnosis not present

## 2018-09-22 DIAGNOSIS — H2513 Age-related nuclear cataract, bilateral: Secondary | ICD-10-CM | POA: Diagnosis not present

## 2018-09-22 DIAGNOSIS — H401131 Primary open-angle glaucoma, bilateral, mild stage: Secondary | ICD-10-CM | POA: Diagnosis not present

## 2018-09-22 DIAGNOSIS — E119 Type 2 diabetes mellitus without complications: Secondary | ICD-10-CM | POA: Diagnosis not present

## 2018-09-28 DIAGNOSIS — G4733 Obstructive sleep apnea (adult) (pediatric): Secondary | ICD-10-CM | POA: Diagnosis not present

## 2018-09-30 DIAGNOSIS — Z23 Encounter for immunization: Secondary | ICD-10-CM | POA: Diagnosis not present

## 2018-10-11 DIAGNOSIS — E1121 Type 2 diabetes mellitus with diabetic nephropathy: Secondary | ICD-10-CM | POA: Diagnosis not present

## 2018-10-11 DIAGNOSIS — Z79899 Other long term (current) drug therapy: Secondary | ICD-10-CM | POA: Diagnosis not present

## 2018-10-11 DIAGNOSIS — D539 Nutritional anemia, unspecified: Secondary | ICD-10-CM | POA: Diagnosis not present

## 2018-10-11 DIAGNOSIS — Z13220 Encounter for screening for lipoid disorders: Secondary | ICD-10-CM | POA: Diagnosis not present

## 2018-10-11 DIAGNOSIS — I10 Essential (primary) hypertension: Secondary | ICD-10-CM | POA: Diagnosis not present

## 2018-10-13 DIAGNOSIS — G4733 Obstructive sleep apnea (adult) (pediatric): Secondary | ICD-10-CM | POA: Diagnosis not present

## 2018-10-13 DIAGNOSIS — Z87891 Personal history of nicotine dependence: Secondary | ICD-10-CM | POA: Diagnosis not present

## 2018-10-13 DIAGNOSIS — E1121 Type 2 diabetes mellitus with diabetic nephropathy: Secondary | ICD-10-CM | POA: Diagnosis not present

## 2018-10-13 DIAGNOSIS — I129 Hypertensive chronic kidney disease with stage 1 through stage 4 chronic kidney disease, or unspecified chronic kidney disease: Secondary | ICD-10-CM | POA: Diagnosis not present

## 2018-10-13 DIAGNOSIS — N1831 Chronic kidney disease, stage 3a: Secondary | ICD-10-CM | POA: Diagnosis not present

## 2018-10-13 DIAGNOSIS — E1122 Type 2 diabetes mellitus with diabetic chronic kidney disease: Secondary | ICD-10-CM | POA: Diagnosis not present

## 2018-10-28 DIAGNOSIS — G4733 Obstructive sleep apnea (adult) (pediatric): Secondary | ICD-10-CM | POA: Diagnosis not present

## 2018-11-28 DIAGNOSIS — G4733 Obstructive sleep apnea (adult) (pediatric): Secondary | ICD-10-CM | POA: Diagnosis not present

## 2018-12-22 DIAGNOSIS — H2513 Age-related nuclear cataract, bilateral: Secondary | ICD-10-CM | POA: Diagnosis not present

## 2018-12-22 DIAGNOSIS — H43812 Vitreous degeneration, left eye: Secondary | ICD-10-CM | POA: Diagnosis not present

## 2018-12-22 DIAGNOSIS — H25013 Cortical age-related cataract, bilateral: Secondary | ICD-10-CM | POA: Diagnosis not present

## 2018-12-22 DIAGNOSIS — H401131 Primary open-angle glaucoma, bilateral, mild stage: Secondary | ICD-10-CM | POA: Diagnosis not present

## 2018-12-22 DIAGNOSIS — E119 Type 2 diabetes mellitus without complications: Secondary | ICD-10-CM | POA: Diagnosis not present

## 2018-12-28 DIAGNOSIS — G4733 Obstructive sleep apnea (adult) (pediatric): Secondary | ICD-10-CM | POA: Diagnosis not present

## 2019-01-12 DIAGNOSIS — Z79899 Other long term (current) drug therapy: Secondary | ICD-10-CM | POA: Diagnosis not present

## 2019-01-19 DIAGNOSIS — Z0000 Encounter for general adult medical examination without abnormal findings: Secondary | ICD-10-CM | POA: Diagnosis not present

## 2019-01-19 DIAGNOSIS — Z79899 Other long term (current) drug therapy: Secondary | ICD-10-CM | POA: Diagnosis not present

## 2019-01-19 DIAGNOSIS — E1122 Type 2 diabetes mellitus with diabetic chronic kidney disease: Secondary | ICD-10-CM | POA: Diagnosis not present

## 2019-01-19 DIAGNOSIS — D539 Nutritional anemia, unspecified: Secondary | ICD-10-CM | POA: Diagnosis not present

## 2019-01-19 DIAGNOSIS — N1831 Chronic kidney disease, stage 3a: Secondary | ICD-10-CM | POA: Diagnosis not present

## 2019-01-19 DIAGNOSIS — J449 Chronic obstructive pulmonary disease, unspecified: Secondary | ICD-10-CM | POA: Diagnosis not present

## 2019-01-19 DIAGNOSIS — I129 Hypertensive chronic kidney disease with stage 1 through stage 4 chronic kidney disease, or unspecified chronic kidney disease: Secondary | ICD-10-CM | POA: Diagnosis not present

## 2019-01-28 DIAGNOSIS — G4733 Obstructive sleep apnea (adult) (pediatric): Secondary | ICD-10-CM | POA: Diagnosis not present

## 2019-02-02 DIAGNOSIS — M1A00X0 Idiopathic chronic gout, unspecified site, without tophus (tophi): Secondary | ICD-10-CM | POA: Diagnosis not present

## 2019-02-02 DIAGNOSIS — M1A9XX0 Chronic gout, unspecified, without tophus (tophi): Secondary | ICD-10-CM | POA: Diagnosis not present

## 2019-02-02 DIAGNOSIS — N1832 Chronic kidney disease, stage 3b: Secondary | ICD-10-CM | POA: Diagnosis not present

## 2019-02-02 DIAGNOSIS — N183 Chronic kidney disease, stage 3 unspecified: Secondary | ICD-10-CM | POA: Diagnosis not present

## 2019-02-02 DIAGNOSIS — Z79899 Other long term (current) drug therapy: Secondary | ICD-10-CM | POA: Diagnosis not present

## 2019-02-07 DIAGNOSIS — Z1211 Encounter for screening for malignant neoplasm of colon: Secondary | ICD-10-CM | POA: Diagnosis not present

## 2019-02-20 ENCOUNTER — Ambulatory Visit: Payer: Medicare HMO | Attending: Internal Medicine

## 2019-02-20 ENCOUNTER — Other Ambulatory Visit: Payer: Self-pay

## 2019-02-20 DIAGNOSIS — Z23 Encounter for immunization: Secondary | ICD-10-CM | POA: Insufficient documentation

## 2019-02-20 NOTE — Progress Notes (Signed)
   Covid-19 Vaccination Clinic  Name:  Mackenzie Hill    MRN: [id number] DOB: [birth year]  02/20/2019  [name] was observed post Covid-19 immunization for 15 minutes without incidence. She was provided with Vaccine Information Sheet and instruction to access the V-Safe system.   [name] was instructed to call 911 with any severe reactions post vaccine: Marland Kitchen Difficulty breathing  . Swelling of your face and throat  . A fast heartbeat  . A bad rash all over your body  . Dizziness and weakness    Immunizations Administered    Name Date Dose VIS Date Route   Moderna COVID-19 Vaccine 02/20/2019 12:12 PM 0.5 mL 12/13/2018 Intramuscular   Manufacturer: Moderna   Lot: [id number]   [id number]

## 2019-02-28 DIAGNOSIS — G4733 Obstructive sleep apnea (adult) (pediatric): Secondary | ICD-10-CM | POA: Diagnosis not present

## 2019-03-14 DIAGNOSIS — E1122 Type 2 diabetes mellitus with diabetic chronic kidney disease: Secondary | ICD-10-CM | POA: Diagnosis not present

## 2019-03-14 DIAGNOSIS — I130 Hypertensive heart and chronic kidney disease with heart failure and stage 1 through stage 4 chronic kidney disease, or unspecified chronic kidney disease: Secondary | ICD-10-CM | POA: Diagnosis not present

## 2019-03-14 DIAGNOSIS — I129 Hypertensive chronic kidney disease with stage 1 through stage 4 chronic kidney disease, or unspecified chronic kidney disease: Secondary | ICD-10-CM | POA: Insufficient documentation

## 2019-03-14 DIAGNOSIS — N2581 Secondary hyperparathyroidism of renal origin: Secondary | ICD-10-CM | POA: Insufficient documentation

## 2019-03-14 DIAGNOSIS — R809 Proteinuria, unspecified: Secondary | ICD-10-CM | POA: Diagnosis not present

## 2019-03-14 DIAGNOSIS — I509 Heart failure, unspecified: Secondary | ICD-10-CM | POA: Diagnosis not present

## 2019-03-14 DIAGNOSIS — N184 Chronic kidney disease, stage 4 (severe): Secondary | ICD-10-CM | POA: Diagnosis not present

## 2019-03-22 ENCOUNTER — Ambulatory Visit: Payer: Medicare HMO | Attending: Internal Medicine

## 2019-03-22 DIAGNOSIS — E119 Type 2 diabetes mellitus without complications: Secondary | ICD-10-CM | POA: Diagnosis not present

## 2019-03-22 DIAGNOSIS — H401131 Primary open-angle glaucoma, bilateral, mild stage: Secondary | ICD-10-CM | POA: Diagnosis not present

## 2019-03-22 DIAGNOSIS — H25013 Cortical age-related cataract, bilateral: Secondary | ICD-10-CM | POA: Diagnosis not present

## 2019-03-22 DIAGNOSIS — Z23 Encounter for immunization: Secondary | ICD-10-CM | POA: Insufficient documentation

## 2019-03-22 DIAGNOSIS — H2513 Age-related nuclear cataract, bilateral: Secondary | ICD-10-CM | POA: Diagnosis not present

## 2019-03-22 DIAGNOSIS — H43812 Vitreous degeneration, left eye: Secondary | ICD-10-CM | POA: Diagnosis not present

## 2019-03-22 DIAGNOSIS — Z7984 Long term (current) use of oral hypoglycemic drugs: Secondary | ICD-10-CM | POA: Diagnosis not present

## 2019-03-22 NOTE — Progress Notes (Signed)
   Covid-19 Vaccination Clinic  Name:  Mackenzie Hill    MRN: [id number] DOB: 03/20/1940  03/22/2019  [name] was observed post Covid-19 immunization for 15 minutes without incident. She was provided with Vaccine Information Sheet and instruction to access the V-Safe system.   [name] was instructed to call 911 with any severe reactions post vaccine: Marland Kitchen Difficulty breathing  . Swelling of face and throat  . A fast heartbeat  . A bad rash all over body  . Dizziness and weakness   Immunizations Administered    Name Date Dose VIS Date Route   Moderna COVID-19 Vaccine 03/22/2019 11:46 AM 0.5 mL 12/13/2018 Intramuscular   Manufacturer: Moderna   Lot: [id number]   [place]: [id number]

## 2019-03-28 DIAGNOSIS — G4733 Obstructive sleep apnea (adult) (pediatric): Secondary | ICD-10-CM | POA: Diagnosis not present

## 2019-04-24 DIAGNOSIS — I129 Hypertensive chronic kidney disease with stage 1 through stage 4 chronic kidney disease, or unspecified chronic kidney disease: Secondary | ICD-10-CM | POA: Diagnosis not present

## 2019-04-24 DIAGNOSIS — N1832 Chronic kidney disease, stage 3b: Secondary | ICD-10-CM | POA: Diagnosis not present

## 2019-04-24 DIAGNOSIS — Z79899 Other long term (current) drug therapy: Secondary | ICD-10-CM | POA: Diagnosis not present

## 2019-04-24 DIAGNOSIS — R5383 Other fatigue: Secondary | ICD-10-CM | POA: Diagnosis not present

## 2019-04-24 DIAGNOSIS — D539 Nutritional anemia, unspecified: Secondary | ICD-10-CM | POA: Diagnosis not present

## 2019-04-24 DIAGNOSIS — E1122 Type 2 diabetes mellitus with diabetic chronic kidney disease: Secondary | ICD-10-CM | POA: Diagnosis not present

## 2019-04-24 DIAGNOSIS — R079 Chest pain, unspecified: Secondary | ICD-10-CM | POA: Diagnosis not present

## 2019-04-28 DIAGNOSIS — G4733 Obstructive sleep apnea (adult) (pediatric): Secondary | ICD-10-CM | POA: Diagnosis not present

## 2019-05-01 DIAGNOSIS — I208 Other forms of angina pectoris: Secondary | ICD-10-CM | POA: Diagnosis not present

## 2019-05-01 DIAGNOSIS — E119 Type 2 diabetes mellitus without complications: Secondary | ICD-10-CM | POA: Diagnosis not present

## 2019-05-01 DIAGNOSIS — R011 Cardiac murmur, unspecified: Secondary | ICD-10-CM | POA: Diagnosis not present

## 2019-05-01 DIAGNOSIS — R0602 Shortness of breath: Secondary | ICD-10-CM | POA: Diagnosis not present

## 2019-05-01 DIAGNOSIS — I5189 Other ill-defined heart diseases: Secondary | ICD-10-CM | POA: Diagnosis not present

## 2019-05-01 DIAGNOSIS — D539 Nutritional anemia, unspecified: Secondary | ICD-10-CM | POA: Diagnosis not present

## 2019-05-01 DIAGNOSIS — N1832 Chronic kidney disease, stage 3b: Secondary | ICD-10-CM | POA: Diagnosis not present

## 2019-05-01 DIAGNOSIS — J449 Chronic obstructive pulmonary disease, unspecified: Secondary | ICD-10-CM | POA: Diagnosis not present

## 2019-05-01 DIAGNOSIS — I10 Essential (primary) hypertension: Secondary | ICD-10-CM | POA: Diagnosis not present

## 2019-05-01 DIAGNOSIS — G4733 Obstructive sleep apnea (adult) (pediatric): Secondary | ICD-10-CM | POA: Diagnosis not present

## 2019-05-18 DIAGNOSIS — I10 Essential (primary) hypertension: Secondary | ICD-10-CM | POA: Diagnosis not present

## 2019-05-18 DIAGNOSIS — Z87891 Personal history of nicotine dependence: Secondary | ICD-10-CM | POA: Diagnosis not present

## 2019-05-18 DIAGNOSIS — E1121 Type 2 diabetes mellitus with diabetic nephropathy: Secondary | ICD-10-CM | POA: Diagnosis not present

## 2019-05-28 DIAGNOSIS — G4733 Obstructive sleep apnea (adult) (pediatric): Secondary | ICD-10-CM | POA: Diagnosis not present

## 2019-06-06 DIAGNOSIS — I208 Other forms of angina pectoris: Secondary | ICD-10-CM | POA: Diagnosis not present

## 2019-06-06 DIAGNOSIS — R0602 Shortness of breath: Secondary | ICD-10-CM | POA: Diagnosis not present

## 2019-06-14 DIAGNOSIS — N183 Chronic kidney disease, stage 3 unspecified: Secondary | ICD-10-CM | POA: Diagnosis not present

## 2019-06-14 DIAGNOSIS — I5189 Other ill-defined heart diseases: Secondary | ICD-10-CM | POA: Diagnosis not present

## 2019-06-14 DIAGNOSIS — R011 Cardiac murmur, unspecified: Secondary | ICD-10-CM | POA: Diagnosis not present

## 2019-06-14 DIAGNOSIS — E119 Type 2 diabetes mellitus without complications: Secondary | ICD-10-CM | POA: Diagnosis not present

## 2019-06-14 DIAGNOSIS — G4733 Obstructive sleep apnea (adult) (pediatric): Secondary | ICD-10-CM | POA: Diagnosis not present

## 2019-06-14 DIAGNOSIS — R0600 Dyspnea, unspecified: Secondary | ICD-10-CM | POA: Diagnosis not present

## 2019-06-14 DIAGNOSIS — I10 Essential (primary) hypertension: Secondary | ICD-10-CM | POA: Diagnosis not present

## 2019-06-14 DIAGNOSIS — I208 Other forms of angina pectoris: Secondary | ICD-10-CM | POA: Diagnosis not present

## 2019-06-14 DIAGNOSIS — D539 Nutritional anemia, unspecified: Secondary | ICD-10-CM | POA: Diagnosis not present

## 2019-06-26 DIAGNOSIS — H5213 Myopia, bilateral: Secondary | ICD-10-CM | POA: Diagnosis not present

## 2019-06-26 DIAGNOSIS — H43812 Vitreous degeneration, left eye: Secondary | ICD-10-CM | POA: Diagnosis not present

## 2019-06-26 DIAGNOSIS — H2513 Age-related nuclear cataract, bilateral: Secondary | ICD-10-CM | POA: Diagnosis not present

## 2019-06-26 DIAGNOSIS — Z7984 Long term (current) use of oral hypoglycemic drugs: Secondary | ICD-10-CM | POA: Diagnosis not present

## 2019-06-26 DIAGNOSIS — H401121 Primary open-angle glaucoma, left eye, mild stage: Secondary | ICD-10-CM | POA: Diagnosis not present

## 2019-06-26 DIAGNOSIS — H401112 Primary open-angle glaucoma, right eye, moderate stage: Secondary | ICD-10-CM | POA: Diagnosis not present

## 2019-06-26 DIAGNOSIS — E119 Type 2 diabetes mellitus without complications: Secondary | ICD-10-CM | POA: Diagnosis not present

## 2019-06-26 DIAGNOSIS — H25013 Cortical age-related cataract, bilateral: Secondary | ICD-10-CM | POA: Diagnosis not present

## 2019-06-26 DIAGNOSIS — H524 Presbyopia: Secondary | ICD-10-CM | POA: Diagnosis not present

## 2019-06-28 DIAGNOSIS — G4733 Obstructive sleep apnea (adult) (pediatric): Secondary | ICD-10-CM | POA: Diagnosis not present

## 2019-07-03 DIAGNOSIS — H2511 Age-related nuclear cataract, right eye: Secondary | ICD-10-CM | POA: Diagnosis not present

## 2019-07-03 DIAGNOSIS — H2513 Age-related nuclear cataract, bilateral: Secondary | ICD-10-CM | POA: Diagnosis not present

## 2019-07-03 DIAGNOSIS — H25013 Cortical age-related cataract, bilateral: Secondary | ICD-10-CM | POA: Diagnosis not present

## 2019-07-03 DIAGNOSIS — H25043 Posterior subcapsular polar age-related cataract, bilateral: Secondary | ICD-10-CM | POA: Diagnosis not present

## 2019-07-03 DIAGNOSIS — H401131 Primary open-angle glaucoma, bilateral, mild stage: Secondary | ICD-10-CM | POA: Diagnosis not present

## 2019-07-25 DIAGNOSIS — Z79899 Other long term (current) drug therapy: Secondary | ICD-10-CM | POA: Diagnosis not present

## 2019-07-25 DIAGNOSIS — Z7984 Long term (current) use of oral hypoglycemic drugs: Secondary | ICD-10-CM | POA: Diagnosis not present

## 2019-07-25 DIAGNOSIS — E1122 Type 2 diabetes mellitus with diabetic chronic kidney disease: Secondary | ICD-10-CM | POA: Diagnosis not present

## 2019-07-25 DIAGNOSIS — Z1231 Encounter for screening mammogram for malignant neoplasm of breast: Secondary | ICD-10-CM | POA: Diagnosis not present

## 2019-07-25 DIAGNOSIS — Z87891 Personal history of nicotine dependence: Secondary | ICD-10-CM | POA: Diagnosis not present

## 2019-07-25 DIAGNOSIS — D539 Nutritional anemia, unspecified: Secondary | ICD-10-CM | POA: Diagnosis not present

## 2019-07-25 DIAGNOSIS — N1832 Chronic kidney disease, stage 3b: Secondary | ICD-10-CM | POA: Diagnosis not present

## 2019-07-25 DIAGNOSIS — I129 Hypertensive chronic kidney disease with stage 1 through stage 4 chronic kidney disease, or unspecified chronic kidney disease: Secondary | ICD-10-CM | POA: Diagnosis not present

## 2019-07-28 DIAGNOSIS — G4733 Obstructive sleep apnea (adult) (pediatric): Secondary | ICD-10-CM | POA: Diagnosis not present

## 2019-08-03 DIAGNOSIS — H2512 Age-related nuclear cataract, left eye: Secondary | ICD-10-CM | POA: Diagnosis not present

## 2019-08-03 DIAGNOSIS — H25011 Cortical age-related cataract, right eye: Secondary | ICD-10-CM | POA: Diagnosis not present

## 2019-08-03 DIAGNOSIS — H401111 Primary open-angle glaucoma, right eye, mild stage: Secondary | ICD-10-CM | POA: Diagnosis not present

## 2019-08-03 DIAGNOSIS — H2511 Age-related nuclear cataract, right eye: Secondary | ICD-10-CM | POA: Diagnosis not present

## 2019-08-17 DIAGNOSIS — H25012 Cortical age-related cataract, left eye: Secondary | ICD-10-CM | POA: Diagnosis not present

## 2019-08-17 DIAGNOSIS — H2512 Age-related nuclear cataract, left eye: Secondary | ICD-10-CM | POA: Diagnosis not present

## 2019-08-17 DIAGNOSIS — H401121 Primary open-angle glaucoma, left eye, mild stage: Secondary | ICD-10-CM | POA: Diagnosis not present

## 2019-08-17 DIAGNOSIS — H401122 Primary open-angle glaucoma, left eye, moderate stage: Secondary | ICD-10-CM | POA: Diagnosis not present

## 2019-08-28 DIAGNOSIS — G4733 Obstructive sleep apnea (adult) (pediatric): Secondary | ICD-10-CM | POA: Diagnosis not present

## 2019-09-04 DIAGNOSIS — Z1231 Encounter for screening mammogram for malignant neoplasm of breast: Secondary | ICD-10-CM | POA: Diagnosis not present

## 2019-09-05 DIAGNOSIS — Z79899 Other long term (current) drug therapy: Secondary | ICD-10-CM | POA: Diagnosis not present

## 2019-09-05 DIAGNOSIS — N1832 Chronic kidney disease, stage 3b: Secondary | ICD-10-CM | POA: Diagnosis not present

## 2019-09-05 DIAGNOSIS — M1A0720 Idiopathic chronic gout, left ankle and foot, without tophus (tophi): Secondary | ICD-10-CM | POA: Diagnosis not present

## 2019-09-20 DIAGNOSIS — I129 Hypertensive chronic kidney disease with stage 1 through stage 4 chronic kidney disease, or unspecified chronic kidney disease: Secondary | ICD-10-CM | POA: Diagnosis not present

## 2019-09-20 DIAGNOSIS — E1122 Type 2 diabetes mellitus with diabetic chronic kidney disease: Secondary | ICD-10-CM | POA: Diagnosis not present

## 2019-09-20 DIAGNOSIS — R809 Proteinuria, unspecified: Secondary | ICD-10-CM | POA: Diagnosis not present

## 2019-09-20 DIAGNOSIS — N2581 Secondary hyperparathyroidism of renal origin: Secondary | ICD-10-CM | POA: Diagnosis not present

## 2019-09-20 DIAGNOSIS — N184 Chronic kidney disease, stage 4 (severe): Secondary | ICD-10-CM | POA: Diagnosis not present

## 2019-09-28 DIAGNOSIS — G4733 Obstructive sleep apnea (adult) (pediatric): Secondary | ICD-10-CM | POA: Diagnosis not present

## 2019-10-26 DIAGNOSIS — N1832 Chronic kidney disease, stage 3b: Secondary | ICD-10-CM | POA: Diagnosis not present

## 2019-10-26 DIAGNOSIS — Z23 Encounter for immunization: Secondary | ICD-10-CM | POA: Diagnosis not present

## 2019-10-26 DIAGNOSIS — Z79899 Other long term (current) drug therapy: Secondary | ICD-10-CM | POA: Diagnosis not present

## 2019-10-26 DIAGNOSIS — G4733 Obstructive sleep apnea (adult) (pediatric): Secondary | ICD-10-CM | POA: Diagnosis not present

## 2019-10-26 DIAGNOSIS — I129 Hypertensive chronic kidney disease with stage 1 through stage 4 chronic kidney disease, or unspecified chronic kidney disease: Secondary | ICD-10-CM | POA: Diagnosis not present

## 2019-10-26 DIAGNOSIS — E1122 Type 2 diabetes mellitus with diabetic chronic kidney disease: Secondary | ICD-10-CM | POA: Diagnosis not present

## 2019-11-03 DIAGNOSIS — H401112 Primary open-angle glaucoma, right eye, moderate stage: Secondary | ICD-10-CM | POA: Diagnosis not present

## 2019-11-03 DIAGNOSIS — H401121 Primary open-angle glaucoma, left eye, mild stage: Secondary | ICD-10-CM | POA: Diagnosis not present

## 2019-11-03 DIAGNOSIS — Z961 Presence of intraocular lens: Secondary | ICD-10-CM | POA: Diagnosis not present

## 2019-11-03 DIAGNOSIS — H43813 Vitreous degeneration, bilateral: Secondary | ICD-10-CM | POA: Diagnosis not present

## 2019-11-22 DIAGNOSIS — H401121 Primary open-angle glaucoma, left eye, mild stage: Secondary | ICD-10-CM | POA: Diagnosis not present

## 2019-11-22 DIAGNOSIS — H401112 Primary open-angle glaucoma, right eye, moderate stage: Secondary | ICD-10-CM | POA: Diagnosis not present

## 2019-11-22 DIAGNOSIS — H35033 Hypertensive retinopathy, bilateral: Secondary | ICD-10-CM | POA: Diagnosis not present

## 2019-11-22 DIAGNOSIS — H43813 Vitreous degeneration, bilateral: Secondary | ICD-10-CM | POA: Diagnosis not present

## 2019-11-22 DIAGNOSIS — H04121 Dry eye syndrome of right lacrimal gland: Secondary | ICD-10-CM | POA: Diagnosis not present

## 2019-11-22 DIAGNOSIS — Z961 Presence of intraocular lens: Secondary | ICD-10-CM | POA: Diagnosis not present

## 2019-12-20 DIAGNOSIS — D649 Anemia, unspecified: Secondary | ICD-10-CM | POA: Diagnosis not present

## 2019-12-20 DIAGNOSIS — E782 Mixed hyperlipidemia: Secondary | ICD-10-CM | POA: Diagnosis not present

## 2019-12-20 DIAGNOSIS — R0600 Dyspnea, unspecified: Secondary | ICD-10-CM | POA: Diagnosis not present

## 2019-12-20 DIAGNOSIS — Z8679 Personal history of other diseases of the circulatory system: Secondary | ICD-10-CM | POA: Diagnosis not present

## 2019-12-20 DIAGNOSIS — I10 Essential (primary) hypertension: Secondary | ICD-10-CM | POA: Diagnosis not present

## 2019-12-20 DIAGNOSIS — N1832 Chronic kidney disease, stage 3b: Secondary | ICD-10-CM | POA: Diagnosis not present

## 2019-12-20 DIAGNOSIS — E119 Type 2 diabetes mellitus without complications: Secondary | ICD-10-CM | POA: Diagnosis not present

## 2019-12-20 DIAGNOSIS — R011 Cardiac murmur, unspecified: Secondary | ICD-10-CM | POA: Diagnosis not present

## 2019-12-20 DIAGNOSIS — I208 Other forms of angina pectoris: Secondary | ICD-10-CM | POA: Diagnosis not present

## 2020-01-24 DIAGNOSIS — E1121 Type 2 diabetes mellitus with diabetic nephropathy: Secondary | ICD-10-CM | POA: Diagnosis not present

## 2020-01-24 DIAGNOSIS — Z79899 Other long term (current) drug therapy: Secondary | ICD-10-CM | POA: Diagnosis not present

## 2020-01-24 DIAGNOSIS — I10 Essential (primary) hypertension: Secondary | ICD-10-CM | POA: Diagnosis not present

## 2020-01-31 DIAGNOSIS — M19011 Primary osteoarthritis, right shoulder: Secondary | ICD-10-CM | POA: Diagnosis not present

## 2020-01-31 DIAGNOSIS — M25511 Pain in right shoulder: Secondary | ICD-10-CM | POA: Diagnosis not present

## 2020-01-31 DIAGNOSIS — E1122 Type 2 diabetes mellitus with diabetic chronic kidney disease: Secondary | ICD-10-CM | POA: Diagnosis not present

## 2020-01-31 DIAGNOSIS — Z1211 Encounter for screening for malignant neoplasm of colon: Secondary | ICD-10-CM | POA: Diagnosis not present

## 2020-01-31 DIAGNOSIS — M85811 Other specified disorders of bone density and structure, right shoulder: Secondary | ICD-10-CM | POA: Diagnosis not present

## 2020-01-31 DIAGNOSIS — N1832 Chronic kidney disease, stage 3b: Secondary | ICD-10-CM | POA: Diagnosis not present

## 2020-01-31 DIAGNOSIS — I10 Essential (primary) hypertension: Secondary | ICD-10-CM | POA: Diagnosis not present

## 2020-02-09 DIAGNOSIS — Z1211 Encounter for screening for malignant neoplasm of colon: Secondary | ICD-10-CM | POA: Diagnosis not present

## 2020-02-21 DIAGNOSIS — Z961 Presence of intraocular lens: Secondary | ICD-10-CM | POA: Diagnosis not present

## 2020-02-21 DIAGNOSIS — H401121 Primary open-angle glaucoma, left eye, mild stage: Secondary | ICD-10-CM | POA: Diagnosis not present

## 2020-02-21 DIAGNOSIS — H35033 Hypertensive retinopathy, bilateral: Secondary | ICD-10-CM | POA: Diagnosis not present

## 2020-02-21 DIAGNOSIS — E119 Type 2 diabetes mellitus without complications: Secondary | ICD-10-CM | POA: Diagnosis not present

## 2020-02-21 DIAGNOSIS — H401112 Primary open-angle glaucoma, right eye, moderate stage: Secondary | ICD-10-CM | POA: Diagnosis not present

## 2020-02-21 DIAGNOSIS — H04121 Dry eye syndrome of right lacrimal gland: Secondary | ICD-10-CM | POA: Diagnosis not present

## 2020-02-21 DIAGNOSIS — H43813 Vitreous degeneration, bilateral: Secondary | ICD-10-CM | POA: Diagnosis not present

## 2020-02-21 DIAGNOSIS — Z7984 Long term (current) use of oral hypoglycemic drugs: Secondary | ICD-10-CM | POA: Diagnosis not present

## 2020-03-25 DIAGNOSIS — N184 Chronic kidney disease, stage 4 (severe): Secondary | ICD-10-CM | POA: Diagnosis not present

## 2020-03-25 DIAGNOSIS — I509 Heart failure, unspecified: Secondary | ICD-10-CM | POA: Diagnosis not present

## 2020-03-25 DIAGNOSIS — I129 Hypertensive chronic kidney disease with stage 1 through stage 4 chronic kidney disease, or unspecified chronic kidney disease: Secondary | ICD-10-CM | POA: Diagnosis not present

## 2020-03-25 DIAGNOSIS — R809 Proteinuria, unspecified: Secondary | ICD-10-CM | POA: Diagnosis not present

## 2020-03-25 DIAGNOSIS — I130 Hypertensive heart and chronic kidney disease with heart failure and stage 1 through stage 4 chronic kidney disease, or unspecified chronic kidney disease: Secondary | ICD-10-CM | POA: Diagnosis not present

## 2020-03-25 DIAGNOSIS — E1122 Type 2 diabetes mellitus with diabetic chronic kidney disease: Secondary | ICD-10-CM | POA: Diagnosis not present

## 2020-03-25 DIAGNOSIS — J449 Chronic obstructive pulmonary disease, unspecified: Secondary | ICD-10-CM | POA: Diagnosis not present

## 2020-03-25 DIAGNOSIS — N2581 Secondary hyperparathyroidism of renal origin: Secondary | ICD-10-CM | POA: Diagnosis not present

## 2020-03-25 DIAGNOSIS — I5030 Unspecified diastolic (congestive) heart failure: Secondary | ICD-10-CM | POA: Diagnosis not present

## 2020-04-25 DIAGNOSIS — Z13220 Encounter for screening for lipoid disorders: Secondary | ICD-10-CM | POA: Diagnosis not present

## 2020-04-25 DIAGNOSIS — Z1329 Encounter for screening for other suspected endocrine disorder: Secondary | ICD-10-CM | POA: Diagnosis not present

## 2020-04-25 DIAGNOSIS — Z0000 Encounter for general adult medical examination without abnormal findings: Secondary | ICD-10-CM | POA: Diagnosis not present

## 2020-04-25 DIAGNOSIS — M109 Gout, unspecified: Secondary | ICD-10-CM | POA: Diagnosis not present

## 2020-04-25 DIAGNOSIS — Z79899 Other long term (current) drug therapy: Secondary | ICD-10-CM | POA: Diagnosis not present

## 2020-04-25 DIAGNOSIS — N189 Chronic kidney disease, unspecified: Secondary | ICD-10-CM | POA: Diagnosis not present

## 2020-04-25 DIAGNOSIS — E1122 Type 2 diabetes mellitus with diabetic chronic kidney disease: Secondary | ICD-10-CM | POA: Diagnosis not present

## 2020-04-25 DIAGNOSIS — I129 Hypertensive chronic kidney disease with stage 1 through stage 4 chronic kidney disease, or unspecified chronic kidney disease: Secondary | ICD-10-CM | POA: Diagnosis not present

## 2020-05-31 DIAGNOSIS — H43813 Vitreous degeneration, bilateral: Secondary | ICD-10-CM | POA: Diagnosis not present

## 2020-05-31 DIAGNOSIS — E119 Type 2 diabetes mellitus without complications: Secondary | ICD-10-CM | POA: Diagnosis not present

## 2020-05-31 DIAGNOSIS — Z961 Presence of intraocular lens: Secondary | ICD-10-CM | POA: Diagnosis not present

## 2020-05-31 DIAGNOSIS — H04121 Dry eye syndrome of right lacrimal gland: Secondary | ICD-10-CM | POA: Diagnosis not present

## 2020-05-31 DIAGNOSIS — H401112 Primary open-angle glaucoma, right eye, moderate stage: Secondary | ICD-10-CM | POA: Diagnosis not present

## 2020-05-31 DIAGNOSIS — H401121 Primary open-angle glaucoma, left eye, mild stage: Secondary | ICD-10-CM | POA: Diagnosis not present

## 2020-05-31 DIAGNOSIS — Z7984 Long term (current) use of oral hypoglycemic drugs: Secondary | ICD-10-CM | POA: Diagnosis not present

## 2020-05-31 DIAGNOSIS — H35033 Hypertensive retinopathy, bilateral: Secondary | ICD-10-CM | POA: Diagnosis not present

## 2020-07-24 DIAGNOSIS — Z79899 Other long term (current) drug therapy: Secondary | ICD-10-CM | POA: Diagnosis not present

## 2020-07-24 DIAGNOSIS — N1832 Chronic kidney disease, stage 3b: Secondary | ICD-10-CM | POA: Diagnosis not present

## 2020-07-24 DIAGNOSIS — M1A0720 Idiopathic chronic gout, left ankle and foot, without tophus (tophi): Secondary | ICD-10-CM | POA: Diagnosis not present

## 2020-08-01 DIAGNOSIS — M109 Gout, unspecified: Secondary | ICD-10-CM | POA: Diagnosis not present

## 2020-08-01 DIAGNOSIS — R8290 Unspecified abnormal findings in urine: Secondary | ICD-10-CM | POA: Diagnosis not present

## 2020-08-01 DIAGNOSIS — Z1329 Encounter for screening for other suspected endocrine disorder: Secondary | ICD-10-CM | POA: Diagnosis not present

## 2020-08-01 DIAGNOSIS — N1832 Chronic kidney disease, stage 3b: Secondary | ICD-10-CM | POA: Diagnosis not present

## 2020-08-01 DIAGNOSIS — Z1231 Encounter for screening mammogram for malignant neoplasm of breast: Secondary | ICD-10-CM | POA: Diagnosis not present

## 2020-08-01 DIAGNOSIS — E1122 Type 2 diabetes mellitus with diabetic chronic kidney disease: Secondary | ICD-10-CM | POA: Diagnosis not present

## 2020-08-01 DIAGNOSIS — Z13220 Encounter for screening for lipoid disorders: Secondary | ICD-10-CM | POA: Diagnosis not present

## 2020-08-01 DIAGNOSIS — I129 Hypertensive chronic kidney disease with stage 1 through stage 4 chronic kidney disease, or unspecified chronic kidney disease: Secondary | ICD-10-CM | POA: Diagnosis not present

## 2020-08-01 DIAGNOSIS — G4733 Obstructive sleep apnea (adult) (pediatric): Secondary | ICD-10-CM | POA: Diagnosis not present

## 2020-08-01 DIAGNOSIS — Z79899 Other long term (current) drug therapy: Secondary | ICD-10-CM | POA: Diagnosis not present

## 2020-08-28 DIAGNOSIS — E119 Type 2 diabetes mellitus without complications: Secondary | ICD-10-CM | POA: Diagnosis not present

## 2020-08-28 DIAGNOSIS — H401121 Primary open-angle glaucoma, left eye, mild stage: Secondary | ICD-10-CM | POA: Diagnosis not present

## 2020-08-28 DIAGNOSIS — H43813 Vitreous degeneration, bilateral: Secondary | ICD-10-CM | POA: Diagnosis not present

## 2020-08-28 DIAGNOSIS — Z961 Presence of intraocular lens: Secondary | ICD-10-CM | POA: Diagnosis not present

## 2020-08-28 DIAGNOSIS — Z7984 Long term (current) use of oral hypoglycemic drugs: Secondary | ICD-10-CM | POA: Diagnosis not present

## 2020-08-28 DIAGNOSIS — H04121 Dry eye syndrome of right lacrimal gland: Secondary | ICD-10-CM | POA: Diagnosis not present

## 2020-08-28 DIAGNOSIS — H401112 Primary open-angle glaucoma, right eye, moderate stage: Secondary | ICD-10-CM | POA: Diagnosis not present

## 2020-08-28 DIAGNOSIS — H35033 Hypertensive retinopathy, bilateral: Secondary | ICD-10-CM | POA: Diagnosis not present

## 2020-09-12 DIAGNOSIS — Z1231 Encounter for screening mammogram for malignant neoplasm of breast: Secondary | ICD-10-CM | POA: Diagnosis not present

## 2020-09-26 DIAGNOSIS — I129 Hypertensive chronic kidney disease with stage 1 through stage 4 chronic kidney disease, or unspecified chronic kidney disease: Secondary | ICD-10-CM | POA: Diagnosis not present

## 2020-09-26 DIAGNOSIS — R809 Proteinuria, unspecified: Secondary | ICD-10-CM | POA: Diagnosis not present

## 2020-09-26 DIAGNOSIS — E1122 Type 2 diabetes mellitus with diabetic chronic kidney disease: Secondary | ICD-10-CM | POA: Diagnosis not present

## 2020-09-26 DIAGNOSIS — N2581 Secondary hyperparathyroidism of renal origin: Secondary | ICD-10-CM | POA: Diagnosis not present

## 2020-09-26 DIAGNOSIS — N184 Chronic kidney disease, stage 4 (severe): Secondary | ICD-10-CM | POA: Diagnosis not present

## 2020-09-30 DIAGNOSIS — N2581 Secondary hyperparathyroidism of renal origin: Secondary | ICD-10-CM | POA: Diagnosis not present

## 2020-09-30 DIAGNOSIS — N184 Chronic kidney disease, stage 4 (severe): Secondary | ICD-10-CM | POA: Diagnosis not present

## 2020-09-30 DIAGNOSIS — R809 Proteinuria, unspecified: Secondary | ICD-10-CM | POA: Diagnosis not present

## 2020-09-30 DIAGNOSIS — E1122 Type 2 diabetes mellitus with diabetic chronic kidney disease: Secondary | ICD-10-CM | POA: Diagnosis not present

## 2020-09-30 DIAGNOSIS — I129 Hypertensive chronic kidney disease with stage 1 through stage 4 chronic kidney disease, or unspecified chronic kidney disease: Secondary | ICD-10-CM | POA: Diagnosis not present

## 2020-10-17 ENCOUNTER — Other Ambulatory Visit: Payer: Self-pay

## 2020-10-17 ENCOUNTER — Ambulatory Visit: Payer: Medicare HMO | Admitting: Podiatry

## 2020-10-17 ENCOUNTER — Encounter: Payer: Self-pay | Admitting: Podiatry

## 2020-10-17 DIAGNOSIS — M1990 Unspecified osteoarthritis, unspecified site: Secondary | ICD-10-CM | POA: Insufficient documentation

## 2020-10-17 DIAGNOSIS — B351 Tinea unguium: Secondary | ICD-10-CM | POA: Diagnosis not present

## 2020-10-17 DIAGNOSIS — M79674 Pain in right toe(s): Secondary | ICD-10-CM | POA: Diagnosis not present

## 2020-10-17 DIAGNOSIS — D649 Anemia, unspecified: Secondary | ICD-10-CM | POA: Insufficient documentation

## 2020-10-17 DIAGNOSIS — M79675 Pain in left toe(s): Secondary | ICD-10-CM

## 2020-10-17 DIAGNOSIS — E669 Obesity, unspecified: Secondary | ICD-10-CM | POA: Insufficient documentation

## 2020-10-17 DIAGNOSIS — K209 Esophagitis, unspecified without bleeding: Secondary | ICD-10-CM | POA: Insufficient documentation

## 2020-10-17 DIAGNOSIS — M109 Gout, unspecified: Secondary | ICD-10-CM | POA: Insufficient documentation

## 2020-10-17 DIAGNOSIS — D638 Anemia in other chronic diseases classified elsewhere: Secondary | ICD-10-CM | POA: Insufficient documentation

## 2020-10-17 NOTE — Progress Notes (Signed)
This patient presents to my office for at risk foot care.  This patient requires this care by a professional since this patient will be at risk due to having diabetes and CKD. This patient is unable to cut nails herself since the patient cannot reach her nails.These nails are painful walking and wearing shoes.  This patient presents for at risk foot care today.  General Appearance  Alert, conversant and in no acute stress.  Vascular  Dorsalis pedis are palpable  bilaterally.  Posterior tibial pulses are absent  B/L. Capillary return is within normal limits  bilaterally. Temperature is within normal limits  bilaterally.  Neurologic  Senn-Weinstein monofilament wire test within normal limits  bilaterally. Muscle power within normal limits bilaterally.  Nails Thick disfigured discolored nails with subungual debris  from hallux to fifth toes bilaterally. No evidence of bacterial infection or drainage bilaterally.  Orthopedic  No limitations of motion  feet .  No crepitus or effusions noted.  No bony pathology or digital deformities noted.  Skin  normotropic skin with no porokeratosis noted bilaterally.  No signs of infections or ulcers noted.     Onychomycosis  Pain in right toes  Pain in left toes  Consent was obtained for treatment procedures.   Mechanical debridement of nails 1-5  bilaterally performed with a nail nipper.  Filed with dremel without incident.    Return office visit     3 months                 Told patient to return for periodic foot care and evaluation due to potential at risk complications.   Gardiner Barefoot DPM

## 2020-10-31 DIAGNOSIS — N183 Chronic kidney disease, stage 3 unspecified: Secondary | ICD-10-CM | POA: Diagnosis not present

## 2020-10-31 DIAGNOSIS — E1122 Type 2 diabetes mellitus with diabetic chronic kidney disease: Secondary | ICD-10-CM | POA: Diagnosis not present

## 2020-10-31 DIAGNOSIS — Z23 Encounter for immunization: Secondary | ICD-10-CM | POA: Diagnosis not present

## 2020-10-31 DIAGNOSIS — I129 Hypertensive chronic kidney disease with stage 1 through stage 4 chronic kidney disease, or unspecified chronic kidney disease: Secondary | ICD-10-CM | POA: Diagnosis not present

## 2020-10-31 DIAGNOSIS — Z79899 Other long term (current) drug therapy: Secondary | ICD-10-CM | POA: Diagnosis not present

## 2020-11-04 DIAGNOSIS — N184 Chronic kidney disease, stage 4 (severe): Secondary | ICD-10-CM | POA: Diagnosis not present

## 2020-11-04 DIAGNOSIS — R0989 Other specified symptoms and signs involving the circulatory and respiratory systems: Secondary | ICD-10-CM | POA: Diagnosis not present

## 2020-11-04 DIAGNOSIS — R0609 Other forms of dyspnea: Secondary | ICD-10-CM | POA: Diagnosis not present

## 2020-11-04 DIAGNOSIS — G4733 Obstructive sleep apnea (adult) (pediatric): Secondary | ICD-10-CM | POA: Diagnosis not present

## 2020-11-04 DIAGNOSIS — E119 Type 2 diabetes mellitus without complications: Secondary | ICD-10-CM | POA: Diagnosis not present

## 2020-11-04 DIAGNOSIS — R630 Anorexia: Secondary | ICD-10-CM | POA: Diagnosis not present

## 2020-11-04 DIAGNOSIS — R011 Cardiac murmur, unspecified: Secondary | ICD-10-CM | POA: Diagnosis not present

## 2020-11-04 DIAGNOSIS — I208 Other forms of angina pectoris: Secondary | ICD-10-CM | POA: Diagnosis not present

## 2020-11-04 DIAGNOSIS — I10 Essential (primary) hypertension: Secondary | ICD-10-CM | POA: Diagnosis not present

## 2020-11-22 DIAGNOSIS — I771 Stricture of artery: Secondary | ICD-10-CM | POA: Diagnosis not present

## 2020-11-22 DIAGNOSIS — I6523 Occlusion and stenosis of bilateral carotid arteries: Secondary | ICD-10-CM | POA: Diagnosis not present

## 2020-11-22 DIAGNOSIS — R0989 Other specified symptoms and signs involving the circulatory and respiratory systems: Secondary | ICD-10-CM | POA: Diagnosis not present

## 2020-11-27 DIAGNOSIS — R0609 Other forms of dyspnea: Secondary | ICD-10-CM | POA: Diagnosis not present

## 2020-11-27 DIAGNOSIS — I10 Essential (primary) hypertension: Secondary | ICD-10-CM | POA: Diagnosis not present

## 2020-11-27 DIAGNOSIS — N184 Chronic kidney disease, stage 4 (severe): Secondary | ICD-10-CM | POA: Diagnosis not present

## 2020-11-27 DIAGNOSIS — G4733 Obstructive sleep apnea (adult) (pediatric): Secondary | ICD-10-CM | POA: Diagnosis not present

## 2020-11-27 DIAGNOSIS — R0989 Other specified symptoms and signs involving the circulatory and respiratory systems: Secondary | ICD-10-CM | POA: Diagnosis not present

## 2020-11-27 DIAGNOSIS — M1990 Unspecified osteoarthritis, unspecified site: Secondary | ICD-10-CM | POA: Diagnosis not present

## 2020-11-27 DIAGNOSIS — I208 Other forms of angina pectoris: Secondary | ICD-10-CM | POA: Diagnosis not present

## 2020-11-27 DIAGNOSIS — E119 Type 2 diabetes mellitus without complications: Secondary | ICD-10-CM | POA: Diagnosis not present

## 2020-11-27 DIAGNOSIS — R011 Cardiac murmur, unspecified: Secondary | ICD-10-CM | POA: Diagnosis not present

## 2020-12-16 DIAGNOSIS — N183 Chronic kidney disease, stage 3 unspecified: Secondary | ICD-10-CM | POA: Diagnosis not present

## 2020-12-16 DIAGNOSIS — Z79899 Other long term (current) drug therapy: Secondary | ICD-10-CM | POA: Diagnosis not present

## 2020-12-16 DIAGNOSIS — Z87891 Personal history of nicotine dependence: Secondary | ICD-10-CM | POA: Diagnosis not present

## 2020-12-16 DIAGNOSIS — I129 Hypertensive chronic kidney disease with stage 1 through stage 4 chronic kidney disease, or unspecified chronic kidney disease: Secondary | ICD-10-CM | POA: Diagnosis not present

## 2020-12-16 DIAGNOSIS — E1122 Type 2 diabetes mellitus with diabetic chronic kidney disease: Secondary | ICD-10-CM | POA: Diagnosis not present

## 2020-12-23 DIAGNOSIS — H401112 Primary open-angle glaucoma, right eye, moderate stage: Secondary | ICD-10-CM | POA: Diagnosis not present

## 2020-12-23 DIAGNOSIS — E119 Type 2 diabetes mellitus without complications: Secondary | ICD-10-CM | POA: Diagnosis not present

## 2020-12-23 DIAGNOSIS — Z7984 Long term (current) use of oral hypoglycemic drugs: Secondary | ICD-10-CM | POA: Diagnosis not present

## 2020-12-23 DIAGNOSIS — H04121 Dry eye syndrome of right lacrimal gland: Secondary | ICD-10-CM | POA: Diagnosis not present

## 2020-12-23 DIAGNOSIS — Z961 Presence of intraocular lens: Secondary | ICD-10-CM | POA: Diagnosis not present

## 2020-12-23 DIAGNOSIS — H35033 Hypertensive retinopathy, bilateral: Secondary | ICD-10-CM | POA: Diagnosis not present

## 2020-12-23 DIAGNOSIS — H401121 Primary open-angle glaucoma, left eye, mild stage: Secondary | ICD-10-CM | POA: Diagnosis not present

## 2020-12-23 DIAGNOSIS — H43813 Vitreous degeneration, bilateral: Secondary | ICD-10-CM | POA: Diagnosis not present

## 2021-01-23 ENCOUNTER — Other Ambulatory Visit: Payer: Self-pay

## 2021-01-23 ENCOUNTER — Ambulatory Visit: Payer: Medicare HMO | Admitting: Podiatry

## 2021-01-23 ENCOUNTER — Encounter: Payer: Self-pay | Admitting: Podiatry

## 2021-01-23 DIAGNOSIS — B351 Tinea unguium: Secondary | ICD-10-CM | POA: Diagnosis not present

## 2021-01-23 DIAGNOSIS — N184 Chronic kidney disease, stage 4 (severe): Secondary | ICD-10-CM | POA: Diagnosis not present

## 2021-01-23 DIAGNOSIS — E1151 Type 2 diabetes mellitus with diabetic peripheral angiopathy without gangrene: Secondary | ICD-10-CM | POA: Diagnosis not present

## 2021-01-23 DIAGNOSIS — M79674 Pain in right toe(s): Secondary | ICD-10-CM

## 2021-01-23 DIAGNOSIS — M79675 Pain in left toe(s): Secondary | ICD-10-CM

## 2021-01-23 NOTE — Progress Notes (Signed)
This patient presents to my office for at risk foot care.  This patient requires this care by a professional since this patient will be at risk due to having diabetes and CKD. This patient is unable to cut nails herself since the patient cannot reach her nails.These nails are painful walking and wearing shoes.  This patient presents for at risk foot care today.  General Appearance  Alert, conversant and in no acute stress.  Vascular  Dorsalis pedis are  weakly palpable  bilaterally.  Posterior tibial pulses are absent  B/L. Capillary return is within normal limits  bilaterally. Temperature is within normal limits  bilaterally.  Neurologic  Senn-Weinstein monofilament wire test within normal limits  bilaterally. Muscle power within normal limits bilaterally.  Nails Thick disfigured discolored nails with subungual debris  from hallux to fifth toes bilaterally. No evidence of bacterial infection or drainage bilaterally.  Orthopedic  No limitations of motion  feet .  No crepitus or effusions noted.  No bony pathology or digital deformities noted.  Skin  normotropic skin with no porokeratosis noted bilaterally.  No signs of infections or ulcers noted.     Onychomycosis  Pain in right toes  Pain in left toes  Consent was obtained for treatment procedures.   Mechanical debridement of nails 1-5  bilaterally performed with a nail nipper.  Filed with dremel without incident.    Return office visit     3 months                 Told patient to return for periodic foot care and evaluation due to potential at risk complications.   Gardiner Barefoot DPM

## 2021-05-01 ENCOUNTER — Encounter: Payer: Self-pay | Admitting: Podiatry

## 2021-05-01 ENCOUNTER — Ambulatory Visit: Payer: Medicare HMO | Admitting: Podiatry

## 2021-05-01 DIAGNOSIS — M79675 Pain in left toe(s): Secondary | ICD-10-CM | POA: Diagnosis not present

## 2021-05-01 DIAGNOSIS — B351 Tinea unguium: Secondary | ICD-10-CM

## 2021-05-01 DIAGNOSIS — E1151 Type 2 diabetes mellitus with diabetic peripheral angiopathy without gangrene: Secondary | ICD-10-CM

## 2021-05-01 DIAGNOSIS — N184 Chronic kidney disease, stage 4 (severe): Secondary | ICD-10-CM

## 2021-05-01 DIAGNOSIS — M79674 Pain in right toe(s): Secondary | ICD-10-CM

## 2021-05-01 NOTE — Progress Notes (Signed)
This patient presents to my office for at risk foot care.  This patient requires this care by a professional since this patient will be at risk due to having diabetes and CKD. This patient is unable to cut nails herself since the patient cannot reach her nails.These nails are painful walking and wearing shoes.  This patient presents for at risk foot care today. ? ?General Appearance  Alert, conversant and in no acute stress. ? ?Vascular  Dorsalis pedis are  weakly palpable  bilaterally.  Posterior tibial pulses are absent  B/L. Capillary return is within normal limits  bilaterally. Temperature is within normal limits  bilaterally. ? ?Neurologic  Senn-Weinstein monofilament wire test within normal limits  bilaterally. Muscle power within normal limits bilaterally. ? ?Nails Thick disfigured discolored nails with subungual debris  from hallux to fifth toes bilaterally. No evidence of bacterial infection or drainage bilaterally. ? ?Orthopedic  No limitations of motion  feet .  No crepitus or effusions noted.  No bony pathology or digital deformities noted. ? ?Skin  normotropic skin with no porokeratosis noted bilaterally.  No signs of infections or ulcers noted.    ? ?Onychomycosis  Pain in right toes  Pain in left toes ? ?Consent was obtained for treatment procedures.   Mechanical debridement of nails 1-5  bilaterally performed with a nail nipper.  Filed with dremel without incident.  ? ? ?Return office visit     3 months                 Told patient to return for periodic foot care and evaluation due to potential at risk complications. ? ? ?Gardiner Barefoot DPM   ?

## 2021-07-31 ENCOUNTER — Ambulatory Visit: Payer: Medicare HMO | Admitting: Podiatry

## 2021-07-31 ENCOUNTER — Encounter: Payer: Self-pay | Admitting: Podiatry

## 2021-07-31 DIAGNOSIS — B351 Tinea unguium: Secondary | ICD-10-CM

## 2021-07-31 DIAGNOSIS — E1151 Type 2 diabetes mellitus with diabetic peripheral angiopathy without gangrene: Secondary | ICD-10-CM | POA: Diagnosis not present

## 2021-07-31 DIAGNOSIS — M79674 Pain in right toe(s): Secondary | ICD-10-CM

## 2021-07-31 DIAGNOSIS — N184 Chronic kidney disease, stage 4 (severe): Secondary | ICD-10-CM

## 2021-07-31 DIAGNOSIS — M79675 Pain in left toe(s): Secondary | ICD-10-CM | POA: Diagnosis not present

## 2021-07-31 NOTE — Progress Notes (Signed)
This patient presents to my office for at risk foot care.  This patient requires this care by a professional since this patient will be at risk due to having diabetes and CKD. This patient is unable to cut nails herself since the patient cannot reach her nails.These nails are painful walking and wearing shoes.  This patient presents for at risk foot care today.  General Appearance  Alert, conversant and in no acute stress.  Vascular  Dorsalis pedis are  weakly palpable  bilaterally.  Posterior tibial pulses are absent  B/L. Capillary return is within normal limits  bilaterally. Temperature is within normal limits  bilaterally.  Neurologic  Senn-Weinstein monofilament wire test within normal limits  bilaterally. Muscle power within normal limits bilaterally.  Nails Thick disfigured discolored nails with subungual debris  from hallux to fifth toes bilaterally. No evidence of bacterial infection or drainage bilaterally.  Orthopedic  No limitations of motion  feet .  No crepitus or effusions noted.  No bony pathology or digital deformities noted.  Skin  normotropic skin with no porokeratosis noted bilaterally.  No signs of infections or ulcers noted.     Onychomycosis  Pain in right toes  Pain in left toes  Consent was obtained for treatment procedures.   Mechanical debridement of nails 1-5  bilaterally performed with a nail nipper.  Filed with dremel without incident.    Return office visit     3 months                 Told patient to return for periodic foot care and evaluation due to potential at risk complications.   Gardiner Barefoot DPM

## 2021-08-07 ENCOUNTER — Other Ambulatory Visit: Payer: Self-pay | Admitting: General Surgery

## 2021-08-07 DIAGNOSIS — T84059A Periprosthetic osteolysis of unspecified internal prosthetic joint, initial encounter: Secondary | ICD-10-CM

## 2021-08-20 ENCOUNTER — Ambulatory Visit
Admission: RE | Admit: 2021-08-20 | Discharge: 2021-08-20 | Disposition: A | Discharge status: Home / Self Care | Payer: Medicare HMO | Source: Ambulatory Visit | Attending: General Surgery | Admitting: General Surgery

## 2021-08-20 DIAGNOSIS — T84059A Periprosthetic osteolysis of unspecified internal prosthetic joint, initial encounter: Secondary | ICD-10-CM | POA: Diagnosis present

## 2021-09-01 ENCOUNTER — Other Ambulatory Visit
Admission: RE | Admit: 2021-09-01 | Discharge: 2021-09-01 | Disposition: A | Discharge status: Home / Self Care | Payer: Medicare HMO | Source: Ambulatory Visit | Attending: Sports Medicine | Admitting: Sports Medicine

## 2021-09-01 DIAGNOSIS — Z96641 Presence of right artificial hip joint: Secondary | ICD-10-CM | POA: Insufficient documentation

## 2021-09-01 DIAGNOSIS — M25451 Effusion, right hip: Secondary | ICD-10-CM | POA: Insufficient documentation

## 2021-09-01 DIAGNOSIS — M25551 Pain in right hip: Secondary | ICD-10-CM | POA: Insufficient documentation

## 2021-09-01 LAB — SYNOVIAL CELL COUNT + DIFF, W/ CRYSTALS
Crystals, Fluid: NONE SEEN
Eosinophils-Synovial: 0 %
Lymphocytes-Synovial Fld: 9 %
Monocyte-Macrophage-Synovial Fluid: 1 %
Neutrophil, Synovial: 90 %
WBC, Synovial: 17047 /mm3 — ABNORMAL HIGH (ref 0–200)

## 2021-09-02 ENCOUNTER — Other Ambulatory Visit: Payer: Self-pay | Admitting: Orthopedic Surgery

## 2021-09-03 ENCOUNTER — Other Ambulatory Visit: Payer: Medicare HMO

## 2021-09-03 ENCOUNTER — Other Ambulatory Visit: Payer: Self-pay

## 2021-09-03 ENCOUNTER — Encounter
Admission: RE | Admit: 2021-09-03 | Discharge: 2021-09-03 | Disposition: A | Discharge status: Home / Self Care | Payer: Medicare HMO | Source: Ambulatory Visit | Attending: Orthopedic Surgery | Admitting: Orthopedic Surgery

## 2021-09-03 VITALS — Ht 64.0 in | Wt 166.0 lb

## 2021-09-03 DIAGNOSIS — Z01812 Encounter for preprocedural laboratory examination: Secondary | ICD-10-CM

## 2021-09-03 DIAGNOSIS — E1151 Type 2 diabetes mellitus with diabetic peripheral angiopathy without gangrene: Secondary | ICD-10-CM

## 2021-09-03 DIAGNOSIS — M1611 Unilateral primary osteoarthritis, right hip: Secondary | ICD-10-CM

## 2021-09-03 NOTE — Patient Instructions (Addendum)
Your procedure is scheduled on: September 04, 2021 Thursday Report to the Registration Desk on the 1st floor of the [facility]. To find out your arrival time, please call [phone] between 1PM - 3PM on:Wednesday September 03, 2021 If your arrival time is 6:00 am, do not arrive prior to that time as the [place] entrance doors do not open until 6:00 am.  REMEMBER: Instructions that are not followed completely may result in serious medical risk, up to and including death; or upon the discretion of your surgeon and anesthesiologist your surgery may need to be rescheduled.  Do not eat food after midnight the night before surgery.  No gum chewing, lozengers or hard candies.  You may however, drink CLEAR liquids up to 2 hours before you are scheduled to arrive for your surgery. Do not drink anything within 2 hours of your scheduled arrival time.  Clear liquids include: - water   TAKE THESE MEDICATIONS THE MORNING OF SURGERY WITH A SIP OF WATER: amlodipine Isosorbide oxybutin omeprazole (take one the night before and one on the morning of surgery - helps to prevent nausea after surgery.)  Use inhalers on the day of surgery   Do not take glipizide or januvia day of surgery.  One week prior to surgery: Stop Anti-inflammatories (NSAIDS) such as Advil, Aleve, Ibuprofen, Motrin, Naproxen, Naprosyn and Aspirin based products such as Excedrin, Goodys Powder, BC Powder. Stop ANY OVER THE COUNTER supplements until after surgery. You may however, continue to take Tylenol if needed for pain up until the day of surgery.  No Alcohol for 24 hours before or after surgery.  No Smoking including e-cigarettes for 24 hours prior to surgery.  No chewable tobacco products for at least 6 hours prior to surgery.  No nicotine patches on the day of surgery.  Do not use any "recreational" drugs for at least a week prior to your surgery.  Please be advised that the combination of cocaine and anesthesia may  have negative outcomes, up to and including death. If you test positive for cocaine, your surgery will be cancelled.  On the morning of surgery brush your teeth with toothpaste and water, you may rinse your mouth with mouthwash if you wish. Do not swallow any toothpaste or mouthwash.  Shower morning of surgery.  Do not wear jewelry, make-up, hairpins, clips or nail polish.  Do not wear lotions, powders, or perfumes or deodorant  Do not shave body from the neck down 48 hours prior to surgery just in case you cut yourself which could leave a site for infection.  Also, freshly shaved skin may become irritated if using the CHG soap.  Contact lenses, hearing aids and dentures may not be worn into surgery.  Do not bring valuables to the hospital. [facility] is not responsible for any missing/lost belongings or valuables.   Bring your C-PAP to the hospital with you in case you may have to spend the night.   Notify your doctor if there is any change in your medical condition (cold, fever, infection).  Wear comfortable clothing (specific to your surgery type) to the hospital.  After surgery, you can help prevent lung complications by doing breathing exercises.  Take deep breaths and cough every 1-2 hours. Your doctor may order a device called an Incentive Spirometer to help you take deep breaths. When coughing or sneezing, hold a pillow firmly against your incision with both hands. This is called "splinting." Doing this helps protect your incision. It also decreases  belly discomfort.  If you are being admitted to the hospital overnight, leave your suitcase in the car. After surgery it may be brought to your room.  If you are being discharged the day of surgery, you will not be allowed to drive home. You will need a responsible adult (18 years or older) to drive you home and stay with you that night.   If you are taking public transportation, you will need to have a responsible adult (18  years or older) with you. Please confirm with your physician that it is acceptable to use public transportation.   Please call the [place] Dept. at [phone] if you have any questions about these instructions.  Surgery Visitation Policy:  Patients undergoing a surgery or procedure may have two family members or support persons with them as long as the person is not COVID-19 positive or experiencing its symptoms.   Inpatient Visitation:    Visiting hours are 7 a.m. to 8 p.m. Up to four visitors are allowed at one time in a patient room, including children. The visitors may rotate out with other people during the day. One designated support person (adult) may remain overnight.

## 2021-09-04 ENCOUNTER — Inpatient Hospital Stay
Admission: AD | Admit: 2021-09-04 | Discharge: 2021-09-09 | DRG: 464 | Disposition: A | Discharge status: Home Health | Payer: Medicare HMO | Attending: Orthopedic Surgery | Admitting: Orthopedic Surgery

## 2021-09-04 ENCOUNTER — Inpatient Hospital Stay: Payer: Medicare HMO | Admitting: Urgent Care

## 2021-09-04 ENCOUNTER — Inpatient Hospital Stay: Payer: Medicare HMO

## 2021-09-04 ENCOUNTER — Encounter
Admission: AD | Disposition: A | Discharge status: Home Health | Payer: Self-pay | Source: Home / Self Care | Attending: Orthopedic Surgery

## 2021-09-04 ENCOUNTER — Other Ambulatory Visit: Payer: Self-pay

## 2021-09-04 ENCOUNTER — Encounter: Payer: Self-pay | Admitting: Orthopedic Surgery

## 2021-09-04 DIAGNOSIS — Z01812 Encounter for preprocedural laboratory examination: Secondary | ICD-10-CM

## 2021-09-04 DIAGNOSIS — G4733 Obstructive sleep apnea (adult) (pediatric): Secondary | ICD-10-CM | POA: Diagnosis present

## 2021-09-04 DIAGNOSIS — D649 Anemia, unspecified: Secondary | ICD-10-CM | POA: Diagnosis not present

## 2021-09-04 DIAGNOSIS — E1122 Type 2 diabetes mellitus with diabetic chronic kidney disease: Secondary | ICD-10-CM | POA: Diagnosis present

## 2021-09-04 DIAGNOSIS — I129 Hypertensive chronic kidney disease with stage 1 through stage 4 chronic kidney disease, or unspecified chronic kidney disease: Secondary | ICD-10-CM | POA: Diagnosis present

## 2021-09-04 DIAGNOSIS — N189 Chronic kidney disease, unspecified: Secondary | ICD-10-CM

## 2021-09-04 DIAGNOSIS — E1151 Type 2 diabetes mellitus with diabetic peripheral angiopathy without gangrene: Secondary | ICD-10-CM

## 2021-09-04 DIAGNOSIS — M5136 Other intervertebral disc degeneration, lumbar region: Secondary | ICD-10-CM | POA: Diagnosis present

## 2021-09-04 DIAGNOSIS — M109 Gout, unspecified: Secondary | ICD-10-CM | POA: Diagnosis present

## 2021-09-04 DIAGNOSIS — M1990 Unspecified osteoarthritis, unspecified site: Secondary | ICD-10-CM | POA: Diagnosis present

## 2021-09-04 DIAGNOSIS — Y831 Surgical operation with implant of artificial internal device as the cause of abnormal reaction of the patient, or of later complication, without mention of misadventure at the time of the procedure: Secondary | ICD-10-CM | POA: Diagnosis present

## 2021-09-04 DIAGNOSIS — D860 Sarcoidosis of lung: Secondary | ICD-10-CM | POA: Diagnosis present

## 2021-09-04 DIAGNOSIS — T8451XD Infection and inflammatory reaction due to internal right hip prosthesis, subsequent encounter: Secondary | ICD-10-CM

## 2021-09-04 DIAGNOSIS — T8459XA Infection and inflammatory reaction due to other internal joint prosthesis, initial encounter: Secondary | ICD-10-CM

## 2021-09-04 DIAGNOSIS — N2581 Secondary hyperparathyroidism of renal origin: Secondary | ICD-10-CM | POA: Diagnosis present

## 2021-09-04 DIAGNOSIS — Z87891 Personal history of nicotine dependence: Secondary | ICD-10-CM | POA: Diagnosis not present

## 2021-09-04 DIAGNOSIS — Z96649 Presence of unspecified artificial hip joint: Principal | ICD-10-CM

## 2021-09-04 DIAGNOSIS — Z7984 Long term (current) use of oral hypoglycemic drugs: Secondary | ICD-10-CM | POA: Diagnosis not present

## 2021-09-04 DIAGNOSIS — D62 Acute posthemorrhagic anemia: Secondary | ICD-10-CM | POA: Diagnosis not present

## 2021-09-04 DIAGNOSIS — N184 Chronic kidney disease, stage 4 (severe): Secondary | ICD-10-CM | POA: Diagnosis present

## 2021-09-04 DIAGNOSIS — T8451XA Infection and inflammatory reaction due to internal right hip prosthesis, initial encounter: Secondary | ICD-10-CM | POA: Diagnosis present

## 2021-09-04 DIAGNOSIS — J449 Chronic obstructive pulmonary disease, unspecified: Secondary | ICD-10-CM | POA: Diagnosis present

## 2021-09-04 DIAGNOSIS — M1611 Unilateral primary osteoarthritis, right hip: Secondary | ICD-10-CM

## 2021-09-04 HISTORY — PX: APPLICATION OF WOUND VAC: SHX5189

## 2021-09-04 HISTORY — PX: ANTERIOR HIP REVISION: SHX6527

## 2021-09-04 LAB — URINALYSIS, ROUTINE W REFLEX MICROSCOPIC
Bacteria, UA: NONE SEEN
Bilirubin Urine: NEGATIVE
Glucose, UA: NEGATIVE mg/dL
Hgb urine dipstick: NEGATIVE
Ketones, ur: NEGATIVE mg/dL
Nitrite: NEGATIVE
Protein, ur: NEGATIVE mg/dL
Specific Gravity, Urine: 1.008 (ref 1.005–1.030)
pH: 5 (ref 5.0–8.0)

## 2021-09-04 LAB — CBC WITH DIFFERENTIAL/PLATELET
Abs Immature Granulocytes: 0.03 10*3/uL (ref 0.00–0.07)
Basophils Absolute: 0 10*3/uL (ref 0.0–0.1)
Basophils Relative: 1 %
Eosinophils Absolute: 0.1 10*3/uL (ref 0.0–0.5)
Eosinophils Relative: 1 %
HCT: 29.8 % — ABNORMAL LOW (ref 36.0–46.0)
Hemoglobin: 9.7 g/dL — ABNORMAL LOW (ref 12.0–15.0)
Immature Granulocytes: 1 %
Lymphocytes Relative: 11 %
Lymphs Abs: 0.7 10*3/uL (ref 0.7–4.0)
MCH: 30 pg (ref 26.0–34.0)
MCHC: 32.6 g/dL (ref 30.0–36.0)
MCV: 92.3 fL (ref 80.0–100.0)
Monocytes Absolute: 0.3 10*3/uL (ref 0.1–1.0)
Monocytes Relative: 5 %
Neutro Abs: 5.2 10*3/uL (ref 1.7–7.7)
Neutrophils Relative %: 81 %
Platelets: 289 10*3/uL (ref 150–400)
RBC: 3.23 MIL/uL — ABNORMAL LOW (ref 3.87–5.11)
RDW: 14.2 % (ref 11.5–15.5)
WBC: 6.4 10*3/uL (ref 4.0–10.5)
nRBC: 0 % (ref 0.0–0.2)

## 2021-09-04 LAB — COMPREHENSIVE METABOLIC PANEL
ALT: 19 U/L (ref 0–44)
AST: 26 U/L (ref 15–41)
Albumin: 3.6 g/dL (ref 3.5–5.0)
Alkaline Phosphatase: 107 U/L (ref 38–126)
Anion gap: 6 (ref 5–15)
BUN: 35 mg/dL — ABNORMAL HIGH (ref 8–23)
CO2: 19 mmol/L — ABNORMAL LOW (ref 22–32)
Calcium: 9.2 mg/dL (ref 8.9–10.3)
Chloride: 113 mmol/L — ABNORMAL HIGH (ref 98–111)
Creatinine, Ser: 1.84 mg/dL — ABNORMAL HIGH (ref 0.44–1.00)
GFR, Estimated: 27 mL/min — ABNORMAL LOW (ref >60)
Glucose, Bld: 159 mg/dL — ABNORMAL HIGH (ref 70–99)
Potassium: 4 mmol/L (ref 3.5–5.1)
Sodium: 138 mmol/L (ref 135–145)
Total Bilirubin: 0.7 mg/dL (ref 0.3–1.2)
Total Protein: 7.1 g/dL (ref 6.5–8.1)

## 2021-09-04 LAB — SURGICAL PCR SCREEN
MRSA, PCR: NEGATIVE
Staphylococcus aureus: NEGATIVE

## 2021-09-04 LAB — TYPE AND SCREEN
ABO/RH(D): O POS
Antibody Screen: NEGATIVE

## 2021-09-04 LAB — GLUCOSE, CAPILLARY
Glucose-Capillary: 138 mg/dL — ABNORMAL HIGH (ref 70–99)
Glucose-Capillary: 113 mg/dL — ABNORMAL HIGH (ref 70–99)
Glucose-Capillary: 183 mg/dL — ABNORMAL HIGH (ref 70–99)

## 2021-09-04 SURGERY — IRRIGATION AND DEBRIDEMENT ANTERIOR HIP
Anesthesia: Spinal | Site: Hip | Laterality: Right

## 2021-09-04 MED ORDER — FENTANYL CITRATE (PF) 100 MCG/2ML IJ SOLN
INTRAMUSCULAR | Status: DC | PRN
  Administered 2021-09-04: 50 ug via INTRAVENOUS

## 2021-09-04 MED ORDER — MONTELUKAST SODIUM 10 MG PO TABS
10.0000 mg | ORAL_TABLET | Freq: Every evening | ORAL | Status: DC
  Administered 2021-09-04 – 2021-09-08 (×5): 10 mg via ORAL
  Filled 2021-09-04 (×5): qty 1

## 2021-09-04 MED ORDER — TIOTROPIUM BROMIDE MONOHYDRATE 18 MCG IN CAPS
18.0000 ug | ORAL_CAPSULE | Freq: Every day | RESPIRATORY_TRACT | Status: DC
  Administered 2021-09-04 – 2021-09-09 (×6): 18 ug via RESPIRATORY_TRACT
  Filled 2021-09-04 (×2): qty 5

## 2021-09-04 MED ORDER — CHLORHEXIDINE GLUCONATE 0.12 % MT SOLN
OROMUCOSAL | Status: AC
  Administered 2021-09-04: 15 mL via OROMUCOSAL
  Filled 2021-09-04: qty 15

## 2021-09-04 MED ORDER — LOSARTAN POTASSIUM 50 MG PO TABS
100.0000 mg | ORAL_TABLET | Freq: Every day | ORAL | Status: DC
  Filled 2021-09-04 (×2): qty 2

## 2021-09-04 MED ORDER — ONDANSETRON HCL 4 MG/2ML IJ SOLN: 4.0000 mg | Freq: Four times a day (QID) | INTRAMUSCULAR | Status: DC | PRN

## 2021-09-04 MED ORDER — ORAL CARE MOUTH RINSE: 15.0000 mL | Freq: Once | OROMUCOSAL | Status: AC

## 2021-09-04 MED ORDER — INSULIN ASPART 100 UNIT/ML IJ SOLN
0.0000 [IU] | Freq: Three times a day (TID) | INTRAMUSCULAR | Status: DC
  Administered 2021-09-09: 5 [IU] via SUBCUTANEOUS
  Filled 2021-09-04: qty 1

## 2021-09-04 MED ORDER — LINAGLIPTIN 5 MG PO TABS
5.0000 mg | ORAL_TABLET | Freq: Every day | ORAL | Status: DC
  Administered 2021-09-04 – 2021-09-09 (×6): 5 mg via ORAL
  Filled 2021-09-04 (×6): qty 1

## 2021-09-04 MED ORDER — OXYCODONE HCL 5 MG PO TABS
5.0000 mg | ORAL_TABLET | ORAL | Status: DC | PRN
  Administered 2021-09-06 (×2): 10 mg via ORAL
  Administered 2021-09-08: 5 mg via ORAL
  Administered 2021-09-09: 10 mg via ORAL
  Filled 2021-09-04: qty 2
  Filled 2021-09-04: qty 1
  Filled 2021-09-04 (×3): qty 2

## 2021-09-04 MED ORDER — CEFAZOLIN SODIUM-DEXTROSE 1-4 GM/50ML-% IV SOLN
INTRAVENOUS | Status: DC | PRN
  Administered 2021-09-04: .2 g via INTRAVENOUS
  Administered 2021-09-04: 1.8 g via INTRAVENOUS

## 2021-09-04 MED ORDER — SODIUM CHLORIDE 0.9 % IV SOLN: INTRAVENOUS | Status: DC

## 2021-09-04 MED ORDER — ASCORBIC ACID 500 MG PO TABS
500.0000 mg | ORAL_TABLET | Freq: Every day | ORAL | Status: DC
  Administered 2021-09-05 – 2021-09-09 (×5): 500 mg via ORAL
  Filled 2021-09-04 (×5): qty 1

## 2021-09-04 MED ORDER — AMLODIPINE BESYLATE 5 MG PO TABS
5.0000 mg | ORAL_TABLET | Freq: Every day | ORAL | Status: DC
  Administered 2021-09-06 – 2021-09-09 (×4): 5 mg via ORAL
  Filled 2021-09-04 (×5): qty 1

## 2021-09-04 MED ORDER — ACETAMINOPHEN 500 MG PO TABS
1000.0000 mg | ORAL_TABLET | Freq: Four times a day (QID) | ORAL | Status: AC
  Administered 2021-09-05 (×3): 1000 mg via ORAL
  Filled 2021-09-04 (×3): qty 2

## 2021-09-04 MED ORDER — CHLORHEXIDINE GLUCONATE 0.12 % MT SOLN: 15.0000 mL | Freq: Once | OROMUCOSAL | Status: AC

## 2021-09-04 MED ORDER — PROPOFOL 500 MG/50ML IV EMUL
INTRAVENOUS | Status: DC | PRN
  Administered 2021-09-04: 50 ug/kg/min via INTRAVENOUS

## 2021-09-04 MED ORDER — PANTOPRAZOLE SODIUM 40 MG PO TBEC
80.0000 mg | DELAYED_RELEASE_TABLET | Freq: Every day | ORAL | Status: DC
  Administered 2021-09-05 – 2021-09-09 (×5): 80 mg via ORAL
  Filled 2021-09-04 (×5): qty 2

## 2021-09-04 MED ORDER — FEBUXOSTAT 40 MG PO TABS
40.0000 mg | ORAL_TABLET | Freq: Every day | ORAL | Status: DC
  Administered 2021-09-04 – 2021-09-09 (×6): 40 mg via ORAL
  Filled 2021-09-04 (×6): qty 1

## 2021-09-04 MED ORDER — ISOSORBIDE MONONITRATE ER 60 MG PO TB24
180.0000 mg | ORAL_TABLET | Freq: Every day | ORAL | Status: DC
  Administered 2021-09-05 – 2021-09-09 (×5): 180 mg via ORAL
  Filled 2021-09-04: qty 3
  Filled 2021-09-04: qty 6
  Filled 2021-09-04: qty 3
  Filled 2021-09-04: qty 6

## 2021-09-04 MED ORDER — HYDROMORPHONE HCL 1 MG/ML IJ SOLN
0.5000 mg | INTRAMUSCULAR | Status: DC | PRN
  Administered 2021-09-04 – 2021-09-07 (×4): 1 mg via INTRAVENOUS
  Filled 2021-09-04 (×6): qty 1

## 2021-09-04 MED ORDER — OXYCODONE HCL 5 MG PO TABS
10.0000 mg | ORAL_TABLET | ORAL | Status: DC | PRN
  Administered 2021-09-04 – 2021-09-08 (×13): 15 mg via ORAL
  Filled 2021-09-04 (×13): qty 3

## 2021-09-04 MED ORDER — MENTHOL 3 MG MT LOZG: 1.0000 | LOZENGE | OROMUCOSAL | Status: DC | PRN

## 2021-09-04 MED ORDER — ZOLPIDEM TARTRATE 5 MG PO TABS: 5.0000 mg | ORAL_TABLET | Freq: Every evening | ORAL | Status: DC | PRN

## 2021-09-04 MED ORDER — METOCLOPRAMIDE HCL 5 MG PO TABS: 5.0000 mg | ORAL_TABLET | Freq: Three times a day (TID) | ORAL | Status: DC | PRN

## 2021-09-04 MED ORDER — FENTANYL CITRATE (PF) 100 MCG/2ML IJ SOLN: 25.0000 ug | INTRAMUSCULAR | Status: DC | PRN

## 2021-09-04 MED ORDER — FLEET ENEMA 7-19 GM/118ML RE ENEM: 1.0000 | ENEMA | Freq: Once | RECTAL | Status: DC | PRN

## 2021-09-04 MED ORDER — GLIPIZIDE ER 10 MG PO TB24
10.0000 mg | ORAL_TABLET | Freq: Two times a day (BID) | ORAL | Status: DC
  Administered 2021-09-04 – 2021-09-09 (×10): 10 mg via ORAL
  Filled 2021-09-04 (×11): qty 1

## 2021-09-04 MED ORDER — ENOXAPARIN SODIUM 30 MG/0.3ML IJ SOSY
30.0000 mg | PREFILLED_SYRINGE | INTRAMUSCULAR | Status: DC
Start: 2021-09-05 — End: 2021-09-09
  Administered 2021-09-05 – 2021-09-09 (×5): 30 mg via SUBCUTANEOUS
  Filled 2021-09-04 (×5): qty 0.3

## 2021-09-04 MED ORDER — ONDANSETRON HCL 4 MG/2ML IJ SOLN
INTRAMUSCULAR | Status: AC
  Filled 2021-09-04: qty 2

## 2021-09-04 MED ORDER — BUPIVACAINE HCL (PF) 0.5 % IJ SOLN
INTRAMUSCULAR | Status: DC | PRN
  Administered 2021-09-04: 2.6 mL via INTRATHECAL

## 2021-09-04 MED ORDER — FLUTICASONE PROPIONATE 50 MCG/ACT NA SUSP
1.0000 | Freq: Every day | NASAL | Status: DC
  Administered 2021-09-04 – 2021-09-09 (×6): 1 via NASAL
  Filled 2021-09-04: qty 16

## 2021-09-04 MED ORDER — SENNOSIDES-DOCUSATE SODIUM 8.6-50 MG PO TABS
1.0000 | ORAL_TABLET | Freq: Every evening | ORAL | Status: DC | PRN
  Administered 2021-09-05: 1 via ORAL
  Filled 2021-09-04: qty 1

## 2021-09-04 MED ORDER — LIDOCAINE HCL (PF) 2 % IJ SOLN
INTRAMUSCULAR | Status: AC
  Filled 2021-09-04: qty 5

## 2021-09-04 MED ORDER — ALBUTEROL SULFATE HFA 108 (90 BASE) MCG/ACT IN AERS
2.0000 | INHALATION_SPRAY | Freq: Four times a day (QID) | RESPIRATORY_TRACT | Status: DC | PRN
Start: 2021-09-04 — End: 2021-09-09

## 2021-09-04 MED ORDER — FLUTICASONE FUROATE-VILANTEROL 200-25 MCG/ACT IN AEPB
1.0000 | INHALATION_SPRAY | Freq: Every day | RESPIRATORY_TRACT | Status: DC
  Administered 2021-09-04 – 2021-09-09 (×6): 1 via RESPIRATORY_TRACT
  Filled 2021-09-04: qty 28

## 2021-09-04 MED ORDER — SODIUM CHLORIDE 0.9 % IR SOLN
Status: DC | PRN
  Administered 2021-09-04: 1000 mL
  Administered 2021-09-04: 6000 mL

## 2021-09-04 MED ORDER — METOCLOPRAMIDE HCL 5 MG/ML IJ SOLN
5.0000 mg | Freq: Three times a day (TID) | INTRAMUSCULAR | Status: DC | PRN
  Administered 2021-09-06: 10 mg via INTRAVENOUS
  Filled 2021-09-04: qty 2

## 2021-09-04 MED ORDER — CEFAZOLIN SODIUM-DEXTROSE 2-4 GM/100ML-% IV SOLN: 2.0000 g | Freq: Three times a day (TID) | INTRAVENOUS | Status: DC

## 2021-09-04 MED ORDER — VITAMIN B-12 1000 MCG PO TABS
1000.0000 ug | ORAL_TABLET | Freq: Every day | ORAL | Status: DC
Start: 2021-09-04 — End: 2021-09-09
  Administered 2021-09-05 – 2021-09-09 (×5): 1000 ug via ORAL
  Filled 2021-09-04 (×5): qty 1

## 2021-09-04 MED ORDER — BUPIVACAINE LIPOSOME 1.3 % IJ SUSP
INTRAMUSCULAR | Status: AC
  Filled 2021-09-04: qty 20

## 2021-09-04 MED ORDER — METHOCARBAMOL 500 MG PO TABS
500.0000 mg | ORAL_TABLET | Freq: Four times a day (QID) | ORAL | Status: DC | PRN
  Administered 2021-09-05 – 2021-09-09 (×8): 500 mg via ORAL
  Filled 2021-09-04 (×8): qty 1

## 2021-09-04 MED ORDER — BUPIVACAINE HCL (PF) 0.5 % IJ SOLN
INTRAMUSCULAR | Status: AC
  Filled 2021-09-04: qty 10

## 2021-09-04 MED ORDER — ONDANSETRON HCL 4 MG PO TABS: 4.0000 mg | ORAL_TABLET | Freq: Four times a day (QID) | ORAL | Status: DC | PRN

## 2021-09-04 MED ORDER — IPRATROPIUM-ALBUTEROL 0.5-2.5 (3) MG/3ML IN SOLN: 3.0000 mL | Freq: Four times a day (QID) | RESPIRATORY_TRACT | Status: DC | PRN

## 2021-09-04 MED ORDER — VANCOMYCIN HCL 1000 MG IV SOLR
INTRAVENOUS | Status: AC
  Filled 2021-09-04: qty 20

## 2021-09-04 MED ORDER — ONDANSETRON HCL 4 MG/2ML IJ SOLN
INTRAMUSCULAR | Status: DC | PRN
  Administered 2021-09-04: 4 mg via INTRAVENOUS

## 2021-09-04 MED ORDER — PROPOFOL 10 MG/ML IV BOLUS
INTRAVENOUS | Status: AC
  Filled 2021-09-04: qty 20

## 2021-09-04 MED ORDER — VANCOMYCIN HCL 1000 MG IV SOLR
INTRAVENOUS | Status: DC | PRN
  Administered 2021-09-04: 1000 mg

## 2021-09-04 MED ORDER — ALUM & MAG HYDROXIDE-SIMETH 200-200-20 MG/5ML PO SUSP: 30.0000 mL | ORAL | Status: DC | PRN

## 2021-09-04 MED ORDER — SUCCINYLCHOLINE CHLORIDE 200 MG/10ML IV SOSY
PREFILLED_SYRINGE | INTRAVENOUS | Status: AC
  Filled 2021-09-04: qty 10

## 2021-09-04 MED ORDER — EPINEPHRINE PF 1 MG/ML IJ SOLN
INTRAMUSCULAR | Status: AC
  Filled 2021-09-04: qty 1

## 2021-09-04 MED ORDER — CEFAZOLIN SODIUM-DEXTROSE 2-4 GM/100ML-% IV SOLN: 2.0000 g | Freq: Four times a day (QID) | INTRAVENOUS | Status: DC

## 2021-09-04 MED ORDER — SURGIRINSE WOUND IRRIGATION SYSTEM - OPTIME
TOPICAL | Status: DC | PRN
  Administered 2021-09-04: 900 mL

## 2021-09-04 MED ORDER — LEVOCETIRIZINE DIHYDROCHLORIDE 5 MG PO TABS: 5.0000 mg | ORAL_TABLET | Freq: Every evening | ORAL | Status: DC

## 2021-09-04 MED ORDER — ONDANSETRON HCL 4 MG/2ML IJ SOLN: 4.0000 mg | Freq: Once | INTRAMUSCULAR | Status: DC | PRN

## 2021-09-04 MED ORDER — 0.9 % SODIUM CHLORIDE (POUR BTL) OPTIME
TOPICAL | Status: DC | PRN
  Administered 2021-09-04: 1000 mL

## 2021-09-04 MED ORDER — EPHEDRINE SULFATE (PRESSORS) 50 MG/ML IJ SOLN
INTRAMUSCULAR | Status: DC | PRN
  Administered 2021-09-04: 7.5 mg via INTRAVENOUS
  Administered 2021-09-04: 5 mg via INTRAVENOUS

## 2021-09-04 MED ORDER — ACETAMINOPHEN 325 MG PO TABS
325.0000 mg | ORAL_TABLET | Freq: Four times a day (QID) | ORAL | Status: DC | PRN
  Administered 2021-09-05: 325 mg via ORAL
  Administered 2021-09-05 – 2021-09-06 (×2): 650 mg via ORAL
  Administered 2021-09-06: 325 mg via ORAL
  Administered 2021-09-06 – 2021-09-07 (×2): 650 mg via ORAL
  Administered 2021-09-08: 325 mg via ORAL
  Administered 2021-09-08: 650 mg via ORAL
  Filled 2021-09-04 (×9): qty 2

## 2021-09-04 MED ORDER — OXYBUTYNIN CHLORIDE 5 MG PO TABS
5.0000 mg | ORAL_TABLET | Freq: Two times a day (BID) | ORAL | Status: DC
  Administered 2021-09-04 – 2021-09-09 (×10): 5 mg via ORAL
  Filled 2021-09-04 (×11): qty 1

## 2021-09-04 MED ORDER — CEFAZOLIN SODIUM-DEXTROSE 2-4 GM/100ML-% IV SOLN
2.0000 g | Freq: Two times a day (BID) | INTRAVENOUS | Status: DC
  Administered 2021-09-04 – 2021-09-08 (×8): 2 g via INTRAVENOUS
  Filled 2021-09-04 (×8): qty 100

## 2021-09-04 MED ORDER — NEOMYCIN-POLYMYXIN B GU 40-200000 IR SOLN
Status: DC | PRN
  Administered 2021-09-04: 24 mL

## 2021-09-04 MED ORDER — DOCUSATE SODIUM 100 MG PO CAPS
100.0000 mg | ORAL_CAPSULE | Freq: Two times a day (BID) | ORAL | Status: DC
  Administered 2021-09-04 – 2021-09-09 (×10): 100 mg via ORAL
  Filled 2021-09-04 (×10): qty 1

## 2021-09-04 MED ORDER — BUPIVACAINE HCL (PF) 0.25 % IJ SOLN
INTRAMUSCULAR | Status: AC
  Filled 2021-09-04: qty 30

## 2021-09-04 MED ORDER — FENTANYL CITRATE (PF) 100 MCG/2ML IJ SOLN
INTRAMUSCULAR | Status: AC
  Filled 2021-09-04: qty 2

## 2021-09-04 MED ORDER — OYSTER SHELL CALCIUM/D3 500-5 MG-MCG PO TABS
2.0000 | ORAL_TABLET | Freq: Every day | ORAL | Status: DC
  Administered 2021-09-05 – 2021-09-09 (×5): 2 via ORAL
  Filled 2021-09-04 (×7): qty 2

## 2021-09-04 MED ORDER — BISACODYL 5 MG PO TBEC
5.0000 mg | DELAYED_RELEASE_TABLET | Freq: Every day | ORAL | Status: DC | PRN
  Administered 2021-09-06 – 2021-09-08 (×2): 5 mg via ORAL
  Filled 2021-09-04 (×2): qty 1

## 2021-09-04 MED ORDER — EPHEDRINE 5 MG/ML INJ
INTRAVENOUS | Status: AC
  Filled 2021-09-04: qty 5

## 2021-09-04 MED ORDER — SPIRONOLACTONE 25 MG PO TABS
25.0000 mg | ORAL_TABLET | Freq: Every day | ORAL | Status: DC
  Administered 2021-09-06 – 2021-09-09 (×4): 25 mg via ORAL
  Filled 2021-09-04 (×6): qty 1

## 2021-09-04 MED ORDER — ASPIRIN 81 MG PO TBEC
81.0000 mg | DELAYED_RELEASE_TABLET | Freq: Every day | ORAL | Status: DC
  Administered 2021-09-05 – 2021-09-09 (×5): 81 mg via ORAL
  Filled 2021-09-04 (×5): qty 1

## 2021-09-04 MED ORDER — CEFAZOLIN SODIUM 1 G IJ SOLR
INTRAMUSCULAR | Status: AC
  Filled 2021-09-04: qty 20

## 2021-09-04 MED ORDER — MAGNESIUM HYDROXIDE 400 MG/5ML PO SUSP
30.0000 mL | Freq: Every day | ORAL | Status: DC
  Administered 2021-09-04 – 2021-09-09 (×6): 30 mL via ORAL
  Filled 2021-09-04 (×6): qty 30

## 2021-09-04 MED ORDER — LORATADINE 10 MG PO TABS
10.0000 mg | ORAL_TABLET | Freq: Every day | ORAL | Status: DC
  Administered 2021-09-04 – 2021-09-09 (×6): 10 mg via ORAL
  Filled 2021-09-04 (×6): qty 1

## 2021-09-04 MED ORDER — ALLOPURINOL 100 MG PO TABS
100.0000 mg | ORAL_TABLET | Freq: Every day | ORAL | Status: DC
  Administered 2021-09-06 – 2021-09-09 (×4): 100 mg via ORAL
  Filled 2021-09-04 (×6): qty 1

## 2021-09-04 MED ORDER — METHOCARBAMOL 1000 MG/10ML IJ SOLN: 500.0000 mg | Freq: Four times a day (QID) | INTRAVENOUS | Status: DC | PRN

## 2021-09-04 MED ORDER — PHENOL 1.4 % MT LIQD: 1.0000 | OROMUCOSAL | Status: DC | PRN

## 2021-09-04 SURGICAL SUPPLY — 62 items
APL PRP STRL LF DISP 70% ISPRP (MISCELLANEOUS) ×2
BLADE SAGITTAL AGGR TOOTH XLG (BLADE) ×2 IMPLANT
BNDG CMPR 5X6 CHSV STRCH STRL (GAUZE/BANDAGES/DRESSINGS) ×6
BNDG COHESIVE 6X5 TAN ST LF (GAUZE/BANDAGES/DRESSINGS) ×6 IMPLANT
CANISTER WOUND CARE 500ML ATS (WOUND CARE) ×2 IMPLANT
CHLORAPREP W/TINT 26 (MISCELLANEOUS) ×2 IMPLANT
CNTNR SPEC 2.5X3XGRAD LEK (MISCELLANEOUS) ×6
CONT SPEC 4OZ STER OR WHT (MISCELLANEOUS) ×6
CONT SPEC 4OZ STRL OR WHT (MISCELLANEOUS) ×6
CONTAINER SPEC 2.5X3XGRAD LEK (MISCELLANEOUS) IMPLANT
COVER BACK TABLE REUSABLE LG (DRAPES) ×2 IMPLANT
DRAPE 3/4 80X56 (DRAPES) ×6 IMPLANT
DRAPE C-ARM XRAY 36X54 (DRAPES) ×2 IMPLANT
DRAPE INCISE IOBAN 66X60 STRL (DRAPES) IMPLANT
DRAPE POUCH INSTRU U-SHP 10X18 (DRAPES) ×2 IMPLANT
DRESSING SURGICEL FIBRLLR 1X2 (HEMOSTASIS) ×4 IMPLANT
DRSG MEPILEX SACRM 8.7X9.8 (GAUZE/BANDAGES/DRESSINGS) ×2 IMPLANT
DRSG SURGICEL FIBRILLAR 1X2 (HEMOSTASIS)
ELECT BLADE 6.5 EXT (BLADE) ×2 IMPLANT
ELECT REM PT RETURN 9FT ADLT (ELECTROSURGICAL) ×2
ELECTRODE REM PT RTRN 9FT ADLT (ELECTROSURGICAL) ×2 IMPLANT
GLOVE BIOGEL PI IND STRL 9 (GLOVE) ×2 IMPLANT
GLOVE BIOGEL PI INDICATOR 9 (GLOVE) ×2
GLOVE SURG SYN 9.0  PF PI (GLOVE) ×4
GLOVE SURG SYN 9.0 PF PI (GLOVE) ×4 IMPLANT
GOWN SRG 2XL LVL 4 RGLN SLV (GOWNS) ×2 IMPLANT
GOWN STRL NON-REIN 2XL LVL4 (GOWNS) ×2
GOWN STRL REUS W/ TWL LRG LVL3 (GOWN DISPOSABLE) ×2 IMPLANT
GOWN STRL REUS W/TWL LRG LVL3 (GOWN DISPOSABLE) ×2
HANDPIECE VERSAJET DEBRIDEMENT (MISCELLANEOUS) IMPLANT
HIP FEM HD M 28 (Head) IMPLANT
HOLDER FOLEY CATH W/STRAP (MISCELLANEOUS) ×2 IMPLANT
HOOD PEEL AWAY FLYTE STAYCOOL (MISCELLANEOUS) ×2 IMPLANT
KIT PREVENA INCISION MGT 13 (CANNISTER) ×2 IMPLANT
KIT STIMULAN RAPID CURE 5CC (Orthopedic Implant) IMPLANT
LINER DBL MOB SZ 0 52MM (Liner) IMPLANT
MANIFOLD NEPTUNE II (INSTRUMENTS) ×2 IMPLANT
MAT ABSORB  FLUID 56X50 GRAY (MISCELLANEOUS) ×2
MAT ABSORB FLUID 56X50 GRAY (MISCELLANEOUS) ×2 IMPLANT
NDL SPNL 20GX3.5 QUINCKE YW (NEEDLE) ×4 IMPLANT
NEEDLE SPNL 20GX3.5 QUINCKE YW (NEEDLE) ×4 IMPLANT
NS IRRIG 1000ML POUR BTL (IV SOLUTION) ×2 IMPLANT
PACK HIP COMPR (MISCELLANEOUS) ×2 IMPLANT
SCALPEL PROTECTED #10 DISP (BLADE) ×4 IMPLANT
SOLUTION IRRIG SURGIPHOR (IV SOLUTION) ×2 IMPLANT
STAPLER SKIN PROX 35W (STAPLE) ×2 IMPLANT
STRAP SAFETY 5IN WIDE (MISCELLANEOUS) ×2 IMPLANT
SUT DVC 2 QUILL PDO  T11 36X36 (SUTURE) ×2
SUT DVC 2 QUILL PDO T11 36X36 (SUTURE) ×2 IMPLANT
SUT DVC VLOC 90 3-0 CV23 UNDY (SUTURE) IMPLANT
SUT SILK 0 (SUTURE) ×2
SUT SILK 0 30XBRD TIE 6 (SUTURE) ×2 IMPLANT
SUT V-LOC 90 ABS DVC 3-0 CL (SUTURE) ×2 IMPLANT
SUT VIC AB 1 CT1 36 (SUTURE) ×2 IMPLANT
SWAB CULTURE AMIES ANAERIB BLU (MISCELLANEOUS) IMPLANT
SYR 50ML LL SCALE MARK (SYRINGE) ×4 IMPLANT
SYR BULB IRRIG 60ML STRL (SYRINGE) ×2 IMPLANT
TAPE MICROFOAM 4IN (TAPE) IMPLANT
TOWEL OR 17X26 4PK STRL BLUE (TOWEL DISPOSABLE) IMPLANT
TRAP FLUID SMOKE EVACUATOR (MISCELLANEOUS) ×2 IMPLANT
TRAY FOLEY MTR SLVR 16FR STAT (SET/KITS/TRAYS/PACK) ×2 IMPLANT
WATER STERILE IRR 1000ML POUR (IV SOLUTION) ×2 IMPLANT

## 2021-09-04 NOTE — Op Note (Signed)
09/04/2021  3:58 PM  PATIENT:  Leta Speller  81 y.o. 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  PROCEDURE:  Procedure(s): IRRIGATION AND DEBRIDEMENT ANTERIOR HIP (Right) ANTERIOR HIP REVISION (Right) Excision of heterotopic ossification  SURGEON: Laurene Footman, MD  ASSISTANTS: None  ANESTHESIA:   spinal  EBL:  Total I/O In: 1000 [I.V.:1000] Out: 700 [Urine:400; Blood:300]  BLOOD ADMINISTERED:none  DRAINS:     Incisional wound VAC  LOCAL MEDICATIONS USED:  NONE  SPECIMEN:  Source of Specimen:    Mild cultures swabs and tissue culture  DISPOSITION OF SPECIMEN:   Microbiology  COUNTS:  YES  TOURNIQUET:  * No tourniquets in log *  IMPLANTS: Medacta Mpact TM cup and liner 52 mm with M metal 28 mm head  DICTATION: .Dragon Dictation patient was brought to the operating room and after adequate spinal anesthesia was obtained patient was placed on the operative table with the left leg on a well-padded table right foot and Medacta attachment.  After prepping and draping in the usual sterile manner appropriate patient identification and timeout procedures were completed.  Prior incision was opened at the bottom of the incision distally there is approximately 2 cm area of necrotic fat which was cultured.  Going deep to this the deep fascia was incised and the tensor muscle retracted posteriorly and the joint capsule exposed with again tissue cultures obtained when opening the capsule fluid was also cultured from the joint fluid but it did not appear very inflammatory.  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 which blocked the view of the hip and this was excised with use of osteotome and rondure.   Following this there is better motion of the hip and the capsulotomy could be completed with deep retractor placed.  The cup was exposed and the head dislocated with removal of the bipolar head with some difficulty secondary to scar tissue.  Once this was done the hip was irrigated with 3 L of pulsatile lavage antibiotic solution followed by use of a versa jet to Clean the metal surfaces as well as the debriding the soft tissues at a lower setting when this 500 cc of been done the wound was left with Betadine to soak for 15 minutes.  Next the Versajet was used again finishing it and then another 3 L of antibiotic irrigation the wound appeared to be clean at this point with only superficial and mid layers having evidence of definite infection with purulent material the hip was reduced after impacting the component onto the stem the hip was then state appeared stable the deep fascia was repaired after placing antibiotic beads with vancomycin deep to the capsule heavy Quill used for the deep fascia 3 oh V-Loc for the subcutaneous layer and skin staples with incisional wound VAC applied.  PLAN OF CARE: Admit to inpatient   PATIENT DISPOSITION:  PACU - hemodynamically stable.

## 2021-09-04 NOTE — H&P (Signed)
[facility] ULTRASOUND GUIDED PROCEDURE VISIT  The patient presents for ultrasound-guided right hip aspiration at the referral of Rachelle Hora, [place]. She was last evaluated by Gerald Stabs on 07/07/2021. At that time, there was question of resolving infectious process this so lab work was obtained. She had her labs on 07/07/2021 which showed ESR 60, CRP 5. Her most recent x-ray showed heterotopic ossification but no abnormality to her hip joint prosthesis. She had also recently had labs on 06/20/2021 which did not show any elevation to WBC but slight neutrophilia, A1c 6.4, stable decreased hemoglobin, stable chronic kidney disease changes with creatinine 2 and EGFR 29 without electrolyte abnormalities. She also has had previous treatment with levofloxacin prescribed by her PCP from 06/20/2021 for questionable infection around the right thigh. She followed up with her PCP again on 07/24/2021 for this issue and was referred to general surgery. She had consultation with general surgery on 08/07/2021 where she did not appear to have any acute infectious process and so an MRI was obtained of the right hip. She had a right hip MRI without contrast on 08/20/2021 which appeared to show a periprosthetic fluid collection extending in from the lateral hip joint superficially in a serpiginous fashion which may represent postoperative seroma versus hematoma versus infection. She was recommended aspiration of this fluid to check for infection which may necessitate additional surgical treatment.  Today, she rates her pain severity a 0/10 but has significantly antalgic gait.  RIGHT ultrasound guided intra-articular hip joint Aspiration  Consent After discussing the various treatment options for the condition, It was agreed that a joint aspiration would be the next step in treatment. The nature of and the indications for a local anaesthetic injection were reviewed in detail with the patient  today. The inherent risks of injection including infection, allergic reaction, increased pain, incomplete relief or temporary relief of symptoms, tendon, ligament or articular cartilage rupture or degeneration, nerve injury, skin depigmentation, and/or fatty atrophy were discussed.   Indication for ultrasound Ultrasound-guided needle placement was indicated during this procedure due to the deep location of the target structure and its proximity to neurovascular structures. Diagnostic aspiration in the setting of clinically absent or minimal effusion.  Procedure After the risks and benefits of the procedure were explained, consent was given, and time-out was performed. The right hip joint and surrounding structures were visualized with ultrasound. There was a mixed echoic signal from the hip joint and tracking superficially which was suspected to represent the abnormal signal seen on MRI. The site for the injection was properly marked and prepped with Chlorhexadine/Isopropyl alcohol solution.   The injection site was anesthetized with ethyl chloride and aspiration was initially attempted more superficially in the fluid collection with a 22-gauge 2.5 inch needle. After multiple redirections and no fluid aspirated, I injected 3cc of 1% Lidocaine using a 22 gauge 2.5 inch needle. Using ultrasound guidance, the hip joint was visualized and aspirated of 0.37m of cloudy, sanguinous fluid with an 18 gauge 3.5 inch needle. No injection was performed. This was sent for lab analysis. Due to the low volume of aspirate, it was preferentially sent for cell count and Gram stain.  A sterile band-aide was applied. Post-injection instructions were given regarding post-procedure care, when to follow up in clinic and what to expect from the procedure. The patient tolerated the injection well and was discharged without complication.   Contact our office with any questions or concerns. Follow up as indicated, or sooner  should  any new problems arise, if conditions worsen, or if they are otherwise concerned.    Rosalia Hammers, DO [facility] [place], [place] [address] Phone: [phone]  This note was generated in part with voice recognition software and I apologize for any typographical errors that were not detected and corrected.  Electronically signed by Rosalia Hammers, DO at 09/01/2021 2:51 PM EDT   Reviewed  H+P. Lab results showed elevated white count and the cell fluid with bare 90% PMNs consistent with deep infection.  She has had persisting swelling over the anterior hip and is felt this is probably a subacute infection.  With her age and general health is felt that a single stage I&D for deep infection should be carried out.  Plan on irrigation debridement removal of femoral head to get adequate debridement placement of a new femoral head component and then antibiotic beads with infectious disease to be consulted. 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

## 2021-09-04 NOTE — Anesthesia Preprocedure Evaluation (Addendum)
Anesthesia Evaluation  Patient identified by MRN, date of birth, ID band Patient awake    Reviewed: Allergy & Precautions, H&P , NPO status , Patient's Chart, lab work & pertinent test results, reviewed documented beta blocker date and time   Airway Mallampati: II  TM Distance: >3 FB Neck ROM: full    Dental  (+) Teeth Intact   Pulmonary shortness of breath, sleep apnea , COPD, former smoker,    Pulmonary exam normal        Cardiovascular Exercise Tolerance: Good hypertension, On Medications +CHF and + DOE  Normal cardiovascular exam Rate:Normal     Neuro/Psych negative neurological ROS  negative psych ROS   GI/Hepatic negative GI ROS, Neg liver ROS,   Endo/Other  negative endocrine ROSdiabetes, Well Controlled, Type 2, Oral Hypoglycemic Agents  Renal/GU Renal disease  negative genitourinary   Musculoskeletal  (+) Arthritis , Osteoarthritis,    Abdominal   Peds  Hematology  (+) Blood dyscrasia, anemia ,   Anesthesia Other Findings   Reproductive/Obstetrics negative OB ROS                            Anesthesia Physical Anesthesia Plan  ASA: 3  Anesthesia Plan: Spinal   Post-op Pain Management:    Induction:   PONV Risk Score and Plan: 4 or greater  Airway Management Planned:   Additional Equipment:   Intra-op Plan:   Post-operative Plan:   Informed Consent: I have reviewed the patients History and Physical, chart, labs and discussed the procedure including the risks, benefits and alternatives for the proposed anesthesia with the patient or authorized representative who has indicated his/her understanding and acceptance.       Plan Discussed with: CRNA  Anesthesia Plan Comments:        Anesthesia Quick Evaluation

## 2021-09-04 NOTE — Consult Note (Signed)
NAME: Mackenzie Hill  DOB: [birth year]  MRN: [id number]  Date/Time: 09/04/2021 5:32 PM  REQUESTING PROVIDER: Dr.Menz Subjective:  REASON FOR CONSULT: rt hip prosthetic joint infection ? Mackenzie Hill is a 81 y.o. female with a history of DM, HTN, Gout, CKD rt hip replacement in 2018 presents with pain rt hip She says she always had pain after the sugery 5 years ago, but in June 2023 noted a dark tender spot on the surgical scar- Her PCP gave her levaquin X 10 days on 06/20/21 -wbc was normal  - She says the levaquin improved the pain some what but only to recur She is on allopurinol for gout and followed by rheumatologist who she saw on 06/23/21 She saw Dr.Menz , ortho on 07/07/21. He sent ESR/CRP  ESR was 60 and CRP N  On 08/07/21 she saw surgeon and he did not think there was any thigh abscess She had MRI on 08/20/21 and it showed Well-defined periprosthetic fluid collection along the lateral margin of the right femoral neck extends through the anterior compartment musculature of the proximal right thigh along its the expected site of prior surgical incision. Collection extends into the overlying subcutaneous soft tissues at the anterolateral aspect of the thigh. Although difficult to measure given the elongated, slightly serpiginous course of the collection, the collection measures approximately 11 cm in length. There is no obvious connection to the overlying skin surfac Pt returned to see Dr.Menz on 8/21/with worsening apin and rt hip was aspirated and showed 16K wbc with 90% N- crystals neg- dont see any culture She was taken to OR today  Dr.Menz's note says  :" Prior incision was opened at the bottom of the incision distally there is approximately 2 cm area of necrotic fat which was cultured.  Going deep to this the deep fascia was incised and the tensor muscle retracted posteriorly and the joint capsule exposed with again tissue cultures obtained when opening the capsule fluid was also cultured from  the joint fluid but it did not appear very inflammatory.  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 which blocked the view of the hip and this was excised with use of osteotome "  The cup was exposed and the head dislocated with removal of the bipolar head with some difficulty secondary to scar tissue Multiple tissue culture and fluid culture sent Pt is currently on cefazolin. I am asked to see her for further management of prosthetic joint infection Pt says she is feeling cold She has not had any fveer or chills She is pretty independent at home Lives with her son Niece at bed side  Past Medical History:  Diagnosis Date   Anemia    Anemia, unspecified    Hgb 11.8 - 04/2013   Arthritis    Cataract    Bilateral   CHF (congestive heart failure) (HCC)    Chronic kidney disease    COPD (chronic obstructive pulmonary disease) ([place])    DDD (degenerative disc disease), lumbar    Degenerative arthritis    Diabetes mellitus type 2, uncomplicated ([place])    AODM (A1C 6.9%) 04/2013   Diabetes mellitus without complication (HCC)    Dyspnea    with exertion   Esophagitis    Gout    Uric acid 5.4 - 08/2012   Hypertension    Obesity, unspecified    Sarcoidosis, lung ([place])    reported by pt, Clinically without a biopsy   Sleep apnea  OSA--Use C-PAP    Past Surgical History:  Procedure Laterality Date   ABDOMINAL HYSTERECTOMY     age 21   CATARACT EXTRACTION Bilateral    COLONOSCOPY  11/02/2005   Hyperplastic Polyp: CBF 10/2015; Recall Ltr mailed 08/27/2015 (dw)   FOOT SURGERY Right    HALLUX VALGUS REPAIR Right    OOPHORECTOMY     TOTAL HIP ARTHROPLASTY 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   Procedure: TOTAL HIP ARTHROPLASTY ANTERIOR APPROACH;  Surgeon: Hessie Knows, MD;  Location: [facility] ORS;  Service: Orthopedics;  Laterality: Right;    Social History   Socioeconomic History   Marital status: Divorced    Spouse name: Not on file   Number of children: 1   Years of  education: 2 years college   Highest education level: Not on file  Occupational History   Occupation: retired    Comment: worked at [employer] Use   Smoking status: Former    Packs/day: 0.70    Years: 3.00    Total pack years: 2.10    Types: Cigarettes    Start date: 01/12/1962    Quit date: 01/12/1966    Years since quitting: 55.6   Smokeless tobacco: Never  Vaping Use   Vaping Use: Never used  Substance and Sexual Activity   Alcohol use: No    Alcohol/week: 0.0 standard drinks of alcohol   Drug use: No   Sexual activity: Not on file  Other Topics Concern   Not on file  Social History Narrative   Not on file   Social Determinants of Health   Financial Resource Strain: Not on file  Food Insecurity: Not on file  Transportation Needs: Not on file  Physical Activity: Not on file  Stress: Not on file  Social Connections: Not on file  Intimate Partner Violence: Not on file    Family History  Problem Relation Age of Onset   Diabetes Mother        ovarian cancer   Hypertension Mother    Ovarian cancer Mother    Diabetes Father        hardening of arteries   Heart disease Father    Hypertension Father    Diabetes Sister    Heart disease Sister    Diabetes Brother    Heart disease Brother    Allergies  Allergen Reactions   Hydralazine Itching   Meloxicam Other (See Comments)    Causes excessive sleepiness fainting   Penicillins Hives, Itching and Other (See Comments)    [place] Has patient had a PCN reaction causing immediate rash, facial/tongue/throat swelling, SOB or lightheadedness with hypotension: Yes Has patient had a PCN reaction causing severe rash involving mucus membranes or skin necrosis: No Has patient had a PCN reaction that required hospitalization: No Has patient had a PCN reaction occurring within the last 10 years: No If all of the above answers are "NO", then may proceed with Cephalosporin use.    Pioglitazone Itching   Tramadol Other (See  Comments)    Causes excessive sleepiness Dizzy/fainting   I? Current Facility-Administered Medications  Medication Dose Route Frequency Provider Last Rate Last Admin   0.9 %  sodium chloride infusion   Intravenous Continuous Hessie Knows, MD       acetaminophen (TYLENOL) tablet 1,000 mg  1,000 mg Oral Q6H Hessie Knows, MD       [START ON 09/05/2021] acetaminophen (TYLENOL) tablet 325-650 mg  325-650 mg Oral Q6H PRN Hessie Knows, MD  albuterol (VENTOLIN HFA) 108 (90 Base) MCG/ACT inhaler 2 puff  2 puff Inhalation Q6H PRN Hessie Knows, MD       allopurinol (ZYLOPRIM) tablet 100 mg  100 mg Oral Daily Hessie Knows, MD       alum & mag hydroxide-simeth (MAALOX/MYLANTA) 200-200-20 MG/5ML suspension 30 mL  30 mL Oral Q4H PRN Hessie Knows, MD       Derrill Memo ON 09/05/2021] amLODipine (NORVASC) tablet 5 mg  5 mg Oral Daily Hessie Knows, MD       ascorbic acid (VITAMIN C) tablet 500 mg  500 mg Oral Daily Hessie Knows, MD       aspirin EC tablet 81 mg  81 mg Oral Daily Hessie Knows, MD       bisacodyl (DULCOLAX) EC tablet 5 mg  5 mg Oral Daily PRN Hessie Knows, MD       calcium-vitamin D Darron Doom WITH D) 500-5 MG-MCG per tablet 2 tablet  2 tablet Oral Daily Hessie Knows, MD       ceFAZolin (ANCEF) IVPB 2g/100 mL premix  2 g Intravenous Q6H Hessie Knows, MD       ceFAZolin (ANCEF) IVPB 2g/100 mL premix  2 g Intravenous Q12H Benita Gutter, RPH       cyanocobalamin (VITAMIN B12) tablet 1,000 mcg  1,000 mcg Oral Daily Hessie Knows, MD       docusate sodium (COLACE) capsule 100 mg  100 mg Oral BID Hessie Knows, MD       Derrill Memo ON 09/05/2021] enoxaparin (LOVENOX) injection 40 mg  40 mg Subcutaneous Q24H Hessie Knows, MD       febuxostat (ULORIC) tablet 40 mg  40 mg Oral Daily Hessie Knows, MD       fluticasone (FLONASE) 50 MCG/ACT nasal spray 1 spray  1 spray Each Nare Daily Hessie Knows, MD       fluticasone furoate-vilanterol (BREO ELLIPTA) 200-25 MCG/ACT 1 puff  1 puff Inhalation Daily  Hessie Knows, MD       glipiZIDE (GLUCOTROL XL) 24 hr tablet 10 mg  10 mg Oral BID Hessie Knows, MD       HYDROmorphone (DILAUDID) injection 0.5-1 mg  0.5-1 mg Intravenous Q4H PRN Hessie Knows, MD       [START ON 09/05/2021] insulin aspart (novoLOG) injection 0-15 Units  0-15 Units Subcutaneous TID WC Hessie Knows, MD       ipratropium-albuterol (DUONEB) 0.5-2.5 (3) MG/3ML nebulizer solution 3 mL  3 mL Nebulization Q6H PRN Hessie Knows, MD       isosorbide mononitrate (IMDUR) 24 hr tablet 180 mg  180 mg Oral Daily Hessie Knows, MD       linagliptin (TRADJENTA) tablet 5 mg  5 mg Oral Daily Hessie Knows, MD       loratadine (CLARITIN) tablet 10 mg  10 mg Oral Daily Hessie Knows, MD       losartan (COZAAR) tablet 100 mg  100 mg Oral Daily Hessie Knows, MD       magnesium hydroxide (MILK OF MAGNESIA) suspension 30 mL  30 mL Oral Daily Hessie Knows, MD       menthol-cetylpyridinium (CEPACOL) lozenge 3 mg  1 lozenge Oral PRN Hessie Knows, MD       Or   phenol (CHLORASEPTIC) mouth spray 1 spray  1 spray Mouth/Throat PRN Hessie Knows, MD       methocarbamol (ROBAXIN) tablet 500 mg  500 mg Oral Q6H PRN Hessie Knows, MD       Or  methocarbamol (ROBAXIN) 500 mg in dextrose 5 % 50 mL IVPB  500 mg Intravenous Q6H PRN Hessie Knows, MD       metoCLOPramide (REGLAN) tablet 5-10 mg  5-10 mg Oral Q8H PRN Hessie Knows, MD       Or   metoCLOPramide (REGLAN) injection 5-10 mg  5-10 mg Intravenous Q8H PRN Hessie Knows, MD       montelukast (SINGULAIR) tablet 10 mg  10 mg Oral QPM Hessie Knows, MD       ondansetron [facility]) tablet 4 mg  4 mg Oral Q6H PRN Hessie Knows, MD       Or   ondansetron [facility]) injection 4 mg  4 mg Intravenous Q6H PRN Hessie Knows, MD       oxybutynin (DITROPAN) tablet 5 mg  5 mg Oral BID Hessie Knows, MD       oxyCODONE (Oxy IR/ROXICODONE) immediate release tablet 10-15 mg  10-15 mg Oral Q4H PRN Hessie Knows, MD       oxyCODONE (Oxy IR/ROXICODONE) immediate release  tablet 5-10 mg  5-10 mg Oral Q4H PRN Hessie Knows, MD       pantoprazole (PROTONIX) EC tablet 80 mg  80 mg Oral Daily Hessie Knows, MD       senna-docusate (Senokot-S) tablet 1 tablet  1 tablet Oral QHS PRN Hessie Knows, MD       sodium phosphate (FLEET) 7-19 GM/118ML enema 1 enema  1 enema Rectal Once PRN Hessie Knows, MD       spironolactone (ALDACTONE) tablet 25 mg  25 mg Oral Daily Hessie Knows, MD       tiotropium [facility]) inhalation capsule ([place] use ONLY) 18 mcg  18 mcg Inhalation Daily Hessie Knows, MD       zolpidem (AMBIEN) tablet 5 mg  5 mg Oral QHS PRN Hessie Knows, MD         Abtx:  Anti-infectives (From admission, onward)    Start     Dose/Rate Route Frequency Ordered Stop   09/04/21 2200  ceFAZolin (ANCEF) IVPB 2g/100 mL premix  Status:  Discontinued        2 g 200 mL/hr over 30 Minutes Intravenous Every 8 hours 09/04/21 1621 09/04/21 1623   09/04/21 2200  ceFAZolin (ANCEF) IVPB 2g/100 mL premix        2 g 200 mL/hr over 30 Minutes Intravenous Every 12 hours 09/04/21 1623 09/09/21 2159   09/04/21 1800  ceFAZolin (ANCEF) IVPB 2g/100 mL premix        2 g 200 mL/hr over 30 Minutes Intravenous Every 6 hours 09/04/21 1710 09/05/21 0559   09/04/21 1459  vancomycin (VANCOCIN) powder  Status:  Discontinued          As needed 09/04/21 1500 09/04/21 1547       REVIEW OF SYSTEMS:  Const: negative fever, negative chills, negative weight loss Eyes: negative diplopia or visual changes, negative eye pain ENT: negative coryza, negative sore throat Resp: negative cough, hemoptysis, dyspnea Cards: negative for chest pain, palpitations, lower extremity edema GU: negative for frequency, dysuria and hematuria GI: Negative for abdominal pain, diarrhea, bleeding, constipation Skin: negative for rash and pruritus Heme: negative for easy bruising and gum/nose bleeding MS: as above Neurolo:negative for headaches, dizziness, vertigo, memory problems  Psych: negative for feelings  of anxiety, depression  Endocrine:  diabetes Allergy/Immunology-PCN- rash- has tolerated amoxicillin she says ( need to check) Objective:  VITALS:  BP (!) 150/67 (BP Location: Left Arm)   Pulse (!) 55  Temp (!) 97.3 F (36.3 C)   Resp 16   LMP 05/28/1969 (Approximate) Comment: hysterectomy  SpO2 98%   PHYSICAL EXAM:  General: Alert, cooperative, no distress, appears stated age.  Head: Normocephalic, without obvious abnormality, atraumatic. Eyes: Conjunctivae clear, anicteric sclerae. Pupils are equal ENT Nares normal. No drainage or sinus tenderness. Lips, mucosa, and tongue normal. No Thrush Neck: Supple, symmetrical, no adenopathy, thyroid: non tender no carotid bruit and no JVD. Back: No CVA tenderness. Lungs: Clear to auscultation bilaterally. No Wheezing or Rhonchi. No rales. Heart: Regular rate and rhythm, no murmur, rub or gallop. Abdomen: Soft, non-tender,not distended. Bowel sounds normal. No masses Extremities: rt hip- surgical site covered with wound vac Skin: No rashes or lesions. Or bruising Lymph: Cervical, supraclavicular normal. Neurologic: Grossly non-focal Pertinent Labs Lab Results CBC    Component Value Date/Time   WBC 6.4 09/04/2021 1113   RBC 3.23 (L) 09/04/2021 1113   HGB 9.7 (L) 09/04/2021 1113   HGB 11.6 (L) 01/08/2014 1109   HCT 29.8 (L) 09/04/2021 1113   HCT 35.6 01/08/2014 1109   PLT 289 09/04/2021 1113   PLT 282 01/08/2014 1109   MCV 92.3 09/04/2021 1113   MCV 93 01/08/2014 1109   MCH 30.0 09/04/2021 1113   MCHC 32.6 09/04/2021 1113   RDW 14.2 09/04/2021 1113   RDW 14.0 01/08/2014 1109   LYMPHSABS 0.7 09/04/2021 1113   MONOABS 0.3 09/04/2021 1113   EOSABS 0.1 09/04/2021 1113   BASOSABS 0.0 09/04/2021 1113       Latest Ref Rng & Units 09/04/2021   11:13 AM 07/23/2016    3:34 AM 07/22/2016    4:25 AM  CMP  Glucose 70 - 99 mg/dL 159  163  145   BUN 8 - 23 mg/dL 35  32  23   Creatinine 0.44 - 1.00 mg/dL 1.84  2.08  1.75   Sodium  135 - 145 mmol/L 138  134  138   Potassium 3.5 - 5.1 mmol/L 4.0  4.4  5.0   Chloride 98 - 111 mmol/L 113  107  111   CO2 22 - 32 mmol/L '19  22  22   ' Calcium 8.9 - 10.3 mg/dL 9.2  8.1  8.5   Total Protein 6.5 - 8.1 g/dL 7.1     Total Bilirubin 0.3 - 1.2 mg/dL 0.7     Alkaline Phos 38 - 126 U/L 107     AST 15 - 41 U/L 26     ALT 0 - 44 U/L 19         Microbiology: Recent Results (from the past 240 hour(s))  Surgical pcr screen     Status: None   Collection Time: 09/04/21 11:34 AM   Specimen: Nasal Mucosa; Nasal Swab  Result Value Ref Range Status   MRSA, PCR NEGATIVE NEGATIVE Final   Staphylococcus aureus NEGATIVE NEGATIVE Final    Comment: (NOTE) The Xpert SA Assay (FDA approved for NASAL specimens in patients 6 years of age and older), is one component of a comprehensive surveillance program. It is not intended to diagnose infection nor to guide or monitor treatment. Performed at [facility], [address] [place], [place] [address]     IMAGING RESULTS: MRI 08/20/21 reviewed I have personally reviewed the films ? Impression/Recommendation ? 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

## 2021-09-04 NOTE — Transfer of Care (Signed)
Immediate Anesthesia Transfer of Care Note  Patient: Mackenzie Hill  Procedure(s) Performed: IRRIGATION AND DEBRIDEMENT ANTERIOR HIP (Right) ANTERIOR HIP REVISION (Right: Hip)  Patient Location: PACU  Anesthesia Type:Spinal  Level of Consciousness: drowsy  Airway & Oxygen Therapy: Patient Spontanous Breathing and Patient connected to face mask oxygen  Post-op Assessment: Report given to RN and Post -op Vital signs reviewed and stable  Post vital signs: Reviewed and stable  Last Vitals:  Vitals Value Taken Time  BP 108/59   Temp    Pulse 59 09/04/21 1550  Resp 12 09/04/21 1550  SpO2 100 % 09/04/21 1550  Vitals shown include unvalidated device data.  Last Pain:  Vitals:   09/04/21 1120  PainSc: 0-No pain         Complications: No notable events documented.

## 2021-09-04 NOTE — Anesthesia Procedure Notes (Addendum)
Spinal  Patient location during procedure: OR Start time: 09/04/2021 1:33 PM End time: 09/04/2021 1:43 PM Reason for block: surgical anesthesia Staffing Anesthesiologist: Molli Barrows, MD Resident/CRNA: Fredderick Phenix, CRNA Performed by: Fredderick Phenix, CRNA Authorized by: Molli Barrows, MD   Preanesthetic Checklist Completed: patient identified, IV checked, site marked, risks and benefits discussed, surgical consent, monitors and equipment checked, pre-op evaluation and timeout performed Spinal Block Patient position: sitting Prep: DuraPrep Patient monitoring: heart rate, cardiac monitor, continuous pulse ox and blood pressure Approach: midline Location: L3-4 Injection technique: single-shot Needle Needle type: Sprotte  Needle gauge: 24 G Needle length: 9 cm Assessment Sensory level: T4 Events: CSF return

## 2021-09-04 NOTE — Progress Notes (Signed)
PHARMACY NOTE:  ANTIMICROBIAL RENAL DOSAGE ADJUSTMENT  Current antimicrobial regimen includes a mismatch between antimicrobial dosage and estimated renal function.  As per policy approved by the Pharmacy & Therapeutics and Medical Executive Committees, the antimicrobial dosage will be adjusted accordingly.  Current antimicrobial dosage: Cefazolin 2 g IV q8h  Indication: Right total hip arthroplasty infection  Renal Function:  Estimated Creatinine Clearance: 24.2 mL/min (A) (by C-G formula based on SCr of 1.84 mg/dL (H)).    Antimicrobial dosage has been changed to:  Cefazolin 2 g IV q12h  Thank you for allowing pharmacy to be a part of this patient's care.  Benita Gutter, [facility] 09/04/2021 4:24 PM

## 2021-09-05 ENCOUNTER — Encounter: Payer: Self-pay | Admitting: Orthopedic Surgery

## 2021-09-05 DIAGNOSIS — T8451XA Infection and inflammatory reaction due to internal right hip prosthesis, initial encounter: Secondary | ICD-10-CM

## 2021-09-05 DIAGNOSIS — I129 Hypertensive chronic kidney disease with stage 1 through stage 4 chronic kidney disease, or unspecified chronic kidney disease: Secondary | ICD-10-CM

## 2021-09-05 DIAGNOSIS — E1122 Type 2 diabetes mellitus with diabetic chronic kidney disease: Secondary | ICD-10-CM | POA: Diagnosis not present

## 2021-09-05 DIAGNOSIS — Z7984 Long term (current) use of oral hypoglycemic drugs: Secondary | ICD-10-CM

## 2021-09-05 DIAGNOSIS — N189 Chronic kidney disease, unspecified: Secondary | ICD-10-CM | POA: Diagnosis not present

## 2021-09-05 DIAGNOSIS — M109 Gout, unspecified: Secondary | ICD-10-CM

## 2021-09-05 DIAGNOSIS — Z87891 Personal history of nicotine dependence: Secondary | ICD-10-CM

## 2021-09-05 LAB — BASIC METABOLIC PANEL
Anion gap: 4 — ABNORMAL LOW (ref 5–15)
BUN: 30 mg/dL — ABNORMAL HIGH (ref 8–23)
CO2: 21 mmol/L — ABNORMAL LOW (ref 22–32)
Calcium: 8.8 mg/dL — ABNORMAL LOW (ref 8.9–10.3)
Chloride: 113 mmol/L — ABNORMAL HIGH (ref 98–111)
Creatinine, Ser: 1.73 mg/dL — ABNORMAL HIGH (ref 0.44–1.00)
GFR, Estimated: 30 mL/min — ABNORMAL LOW (ref >60)
Glucose, Bld: 168 mg/dL — ABNORMAL HIGH (ref 70–99)
Potassium: 4.6 mmol/L (ref 3.5–5.1)
Sodium: 138 mmol/L (ref 135–145)

## 2021-09-05 LAB — CBC
HCT: 25.3 % — ABNORMAL LOW (ref 36.0–46.0)
Hemoglobin: 8.3 g/dL — ABNORMAL LOW (ref 12.0–15.0)
MCH: 30.2 pg (ref 26.0–34.0)
MCHC: 32.8 g/dL (ref 30.0–36.0)
MCV: 92 fL (ref 80.0–100.0)
Platelets: 249 10*3/uL (ref 150–400)
RBC: 2.75 MIL/uL — ABNORMAL LOW (ref 3.87–5.11)
RDW: 14.3 % (ref 11.5–15.5)
WBC: 9.3 10*3/uL (ref 4.0–10.5)
nRBC: 0 % (ref 0.0–0.2)

## 2021-09-05 LAB — GLUCOSE, CAPILLARY
Glucose-Capillary: 152 mg/dL — ABNORMAL HIGH (ref 70–99)
Glucose-Capillary: 182 mg/dL — ABNORMAL HIGH (ref 70–99)
Glucose-Capillary: 146 mg/dL — ABNORMAL HIGH (ref 70–99)
Glucose-Capillary: 209 mg/dL — ABNORMAL HIGH (ref 70–99)

## 2021-09-05 MED ORDER — LOSARTAN POTASSIUM 50 MG PO TABS
100.0000 mg | ORAL_TABLET | Freq: Every day | ORAL | Status: DC
  Administered 2021-09-06 – 2021-09-09 (×4): 100 mg via ORAL
  Filled 2021-09-05 (×5): qty 2

## 2021-09-05 MED ORDER — FE FUMARATE-B12-VIT C-FA-IFC PO CAPS
1.0000 | ORAL_CAPSULE | Freq: Two times a day (BID) | ORAL | Status: DC
  Administered 2021-09-05 – 2021-09-09 (×9): 1 via ORAL
  Filled 2021-09-05 (×10): qty 1

## 2021-09-05 NOTE — Evaluation (Signed)
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 PMH: DM2, HTN, gout, CKD, R hip replacement (2018), lumbar DDD, cataracts, gout  Clinical Impression  Pt seen for PT evaluation with pt agreeable to tx. Pt reports prior to admission she was independent without AD, driving, and denies falls. Pt reports her son lives with her (he attends dialysis throughout the week) & her niece could assist PRN upon d/c. Provided pt with HEP handout & pt performed exercises with AAROM PRN & cuing for technique. Pt requires min assist for STS & short distance gait in room with RW. Pt c/o pain 2/2 abdominal pannus rubbing on anterior hip incision - pt placed towel between at end of session to relieve pain. Will continue to follow pt acutely to address strengthening, gait, and stairs with LRAD.     Recommendations for follow up therapy are one component of a multi-disciplinary discharge planning process, led by the attending physician.  Recommendations may be updated based on patient status, additional functional criteria and insurance authorization.  Follow Up Recommendations Home health PT      Assistance Recommended at Discharge Frequent or constant Supervision/Assistance  Patient can return home with the following  A little help with walking and/or transfers;Assist for transportation;Assistance with cooking/housework;Help with stairs or ramp for entrance    Equipment Recommendations BSC/3in1  Recommendations for Other Services       Functional Status Assessment Patient has had a recent decline in their functional status and demonstrates the ability to make significant improvements in function in a reasonable and predictable amount of time.     Precautions / Restrictions Precautions Precautions:  Fall Restrictions Weight Bearing Restrictions: Yes RLE Weight Bearing: Weight bearing as tolerated      Mobility  Bed Mobility               General bed mobility comments: not observed, pt received & left sitting in recliner    Transfers Overall transfer level: Needs assistance Equipment used: Rolling walker (2 wheels) Transfers: Sit to/from Stand Sit to Stand: Min assist           General transfer comment: requires cuing to scoot R foot back underneath BOS, education re: overall safety & ideal hand placement    Ambulation/Gait Ambulation/Gait assistance: Min assist Gait Distance (Feet): 10 Feet Assistive device: Rolling walker (2 wheels) Gait Pattern/deviations: Decreased step length - right, Decreased step length - left, Decreased dorsiflexion - right, Decreased stride length, Decreased weight shift to right Gait velocity: decreased     General Gait Details: decreased heel strike RLE, decreased gait speed  Stairs            Wheelchair Mobility    Modified Rankin (Stroke Patients Only)       Balance Overall balance assessment: Needs assistance Sitting-balance support: Feet supported, Bilateral upper extremity supported Sitting balance-Leahy Scale: Fair     Standing balance support: During functional activity, Bilateral upper extremity supported Standing balance-Leahy Scale: Fair                               Pertinent Vitals/Pain Pain Assessment Pain Assessment: Faces Faces Pain Scale: Hurts little more Pain Location: R incision where stomach pannus rubs incision Pain Descriptors / Indicators:  Discomfort Pain Intervention(s): Repositioned, Monitored during session    Sephira Zellman expects to be discharged to:: Private residence Living Arrangements: Children Available Help at Discharge: Family;Available PRN/intermittently Type of Home: House Home Access: Stairs to enter Entrance Stairs-Rails: Left Entrance  Stairs-Number of Steps: 2   Home Layout: One level Home Equipment: Conservation officer, nature (2 wheels)      Prior Function Prior Level of Function : Independent/Modified Independent;Driving             Mobility Comments: Pt reports independence without AD, denies falls, still driving.       Hand Dominance        Extremity/Trunk Assessment   Upper Extremity Assessment Upper Extremity Assessment: Overall WFL for tasks assessed    Lower Extremity Assessment Lower Extremity Assessment: Generalized weakness;RLE deficits/detail RLE Deficits / Details: 3-/5 knee extension in sitting       Communication   Communication: No difficulties  Cognition Arousal/Alertness: Awake/alert Behavior During Therapy: WFL for tasks assessed/performed Overall Cognitive Status: Within Functional Limits for tasks assessed                                          General Comments      Exercises Total Joint Exercises Ankle Circles/Pumps: AROM, Both, 10 reps Quad Sets: AROM, Right, 10 reps, Strengthening Gluteal Sets: AROM, Strengthening, 10 reps Short Arc Quad: AROM, Strengthening, Right, 10 reps Heel Slides: AAROM, Strengthening, Right, 10 reps Hip ABduction/ADduction: AAROM, Strengthening, Right, 10 reps (hip adduction pillow squeezes x 10, hip abduction slides x 10) Straight Leg Raises: AAROM, Strengthening, Right, 10 reps Long Arc Quad: Strengthening, AROM, Right, 10 reps, Seated   Assessment/Plan    PT Assessment Patient needs continued PT services  PT Problem List Decreased strength;Pain;Decreased range of motion;Decreased activity tolerance;Decreased balance;Decreased mobility;Decreased skin integrity;Decreased knowledge of use of DME;Decreased safety awareness       PT Treatment Interventions Therapeutic exercise;DME instruction;Gait training;Balance training;Stair training;Neuromuscular re-education;Functional mobility training;Therapeutic activities;Patient/family  education;Modalities    PT Goals (Current goals can be found in the Care Plan section)  Acute Rehab PT Goals Patient Stated Goal: get better, go home, decreased pain PT Goal Formulation: With patient Time For Goal Achievement: 09/19/21 Potential to Achieve Goals: Good    Frequency BID     Co-evaluation               AM-PAC PT "6 Clicks" Mobility  Outcome Measure Help needed turning from your back to your side while in a flat bed without using bedrails?: None Help needed moving from lying on your back to sitting on the side of a flat bed without using bedrails?: A Little Help needed moving to and from a bed to a chair (including a wheelchair)?: A Little Help needed standing up from a chair using your arms (e.g., wheelchair or bedside chair)?: A Little Help needed to walk in hospital room?: A Little Help needed climbing 3-5 steps with a railing? : A Lot 6 Click Score: 18    End of Session Equipment Utilized During Treatment: Gait belt Activity Tolerance: Patient tolerated treatment well Patient left: in chair;with call bell/phone within reach (in handoff to OT, wound vac to R anterior hip intact)   PT Visit Diagnosis: Unsteadiness on feet (R26.81);Muscle weakness (generalized) (M62.81);Difficulty in walking, not elsewhere classified (R26.2);Pain Pain - Right/Left: Right Pain - part of body: Hip    Time: 5701-7793 PT  Time Calculation (min) (ACUTE ONLY): 21 min   Charges:   PT Evaluation $PT Eval Low Complexity: 1 Low PT Treatments $Therapeutic Exercise: 8-22 mins        Lavone Nian, PT, DPT 09/05/21, 10:16 AM   Waunita Schooner 09/05/2021, 10:14 AM

## 2021-09-05 NOTE — Progress Notes (Signed)
16 Fr foley catheter discontinued per orders at this time.Pt tolerated procedure w/o any discomfort.10cc NS pulled from catheter balloon.catheter tip intact.Pt educated per protocol to void within 6 hours of catheter removal.Pt encouraged to consume fluids.Pt expressed understanding.no verbal c/o or any ssx of distress.

## 2021-09-05 NOTE — TOC Progression Note (Signed)
Transition of Care [facility]) - Progression Note    Patient Details  Name: Mackenzie Hill MRN: [id number] Date of Birth: [birth year]  Transition of Care [facility]) CM/SW [place], RN Phone Number: 09/05/2021, 10:09 AM  Clinical Narrative:    Patient lives with her son, she is likely to need IV ABX at DC, TOC to monitor and assist with DC planning         Expected Discharge Plan and Services                                                 Social Determinants of Health (SDOH) Interventions    Readmission Risk Interventions     No data to display

## 2021-09-05 NOTE — Progress Notes (Signed)
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 PMH: DM2, HTN, gout, CKD, R hip replacement (2018), lumbar DDD, cataracts, gout    PT Comments    Pt seen for PT tx with pt agreeable to tx but stating "I'm in pain". Pt declined OOB activity but agreeable to bed level exercises. Pt performs RLE strengthening exercises with ongoing cuing for technique & AAROM PRN.  PT continued to educate pt on need to ambulate & negotiate stairs to ensure safe d/c home, or recommendation would change to STR. Will continue to follow pt acutely to address strengthening, gait, and stair negotiation.    Recommendations for follow up therapy are one component of a multi-disciplinary discharge planning process, led by the attending physician.  Recommendations may be updated based on patient status, additional functional criteria and insurance authorization.  Follow Up Recommendations  Home health PT     Assistance Recommended at Discharge Frequent or constant Supervision/Assistance  Patient can return home with the following A little help with walking and/or transfers;Assist for transportation;Assistance with cooking/housework;Help with stairs or ramp for entrance   Equipment Recommendations  BSC/3in1    Recommendations for Other Services       Precautions / Restrictions Precautions Precautions: Fall Restrictions Weight Bearing Restrictions: Yes RLE Weight Bearing: Weight bearing as tolerated     Mobility  Bed Mobility               General bed mobility comments: pt required bed in trendelenburg position & cuing for technique with pt still only using mostly BUE to assist with scooting to [facility]    Transfers Overall transfer level: Needs assistance Equipment used: Rolling walker (2  wheels) Transfers: Sit to/from Stand Sit to Stand: Min assist           General transfer comment: requires cuing to scoot R foot back underneath BOS, education re: overall safety & ideal hand placement    Ambulation/Gait Ambulation/Gait assistance: Min assist Gait Distance (Feet): 10 Feet Assistive device: Rolling walker (2 wheels) Gait Pattern/deviations: Decreased step length - right, Decreased step length - left, Decreased dorsiflexion - right, Decreased stride length, Decreased weight shift to right Gait velocity: decreased     General Gait Details: decreased heel strike RLE, decreased gait speed   Stairs             Wheelchair Mobility    Modified Rankin (Stroke Patients Only)       Balance Overall balance assessment: Needs assistance Sitting-balance support: Feet supported, Bilateral upper extremity supported Sitting balance-Leahy Scale: Fair     Standing balance support: During functional activity, Bilateral upper extremity supported Standing balance-Leahy Scale: Fair                              Cognition Arousal/Alertness: Awake/alert Behavior During Therapy: WFL for tasks assessed/performed Overall Cognitive Status: Within Functional Limits for tasks assessed                                          Exercises Total Joint Exercises Ankle Circles/Pumps: AROM, Both, 10 reps Quad Sets: AROM, Right, 10 reps, Strengthening Gluteal Sets: AROM,  Strengthening, 10 reps Short Arc Quad: AROM, Strengthening, Right, 10 reps Heel Slides: AAROM, Strengthening, Right, 10 reps Hip ABduction/ADduction: AAROM, Strengthening, Right, 10 reps (hip adduction pillow squeezes x 10, hip abduction slides x 10) Straight Leg Raises: AAROM, Strengthening, Right, 10 reps Long Arc Quad: Strengthening, AROM, Right, 10 reps, Seated General Exercises - Lower Extremity Ankle Circles/Pumps: AROM, Right, 10 reps, Supine Quad Sets: AROM, Strengthening,  Right, 10 reps, Supine Short Arc Quad: AROM, Strengthening, Right, 10 reps, Supine Heel Slides: AAROM, Right, Strengthening, 10 reps, Supine Hip ABduction/ADduction: AAROM, Strengthening, Right, 10 reps, Supine (hip abduction slides x 10 + hip adduction squeezes) Straight Leg Raises: AAROM, Strengthening, Right, 10 reps, Supine    General Comments        Pertinent Vitals/Pain Pain Assessment Pain Assessment: Faces Pain Score: 5  Faces Pain Scale: Hurts little more Pain Location: R hip Pain Descriptors / Indicators: Discomfort Pain Intervention(s): Monitored during session, Patient requesting pain meds-RN notified, Repositioned, Limited activity within patient's tolerance    Home Living Family/patient expects to be discharged to:: Private residence Living Arrangements: Children Available Help at Discharge: Family;Available PRN/intermittently Type of Home: House Home Access: Stairs to enter Entrance Stairs-Rails: Left Entrance Stairs-Number of Steps: 2   Home Layout: One level Home Equipment: Conservation officer, nature (2 wheels);Shower seat;Toilet riser;Grab bars - tub/shower;Adaptive equipment      Prior Function            PT Goals (current goals can now be found in the care plan section) Acute Rehab PT Goals Patient Stated Goal: get better, go home, decreased pain PT Goal Formulation: With patient Time For Goal Achievement: 09/19/21 Potential to Achieve Goals: Good Progress towards PT goals: Progressing toward goals    Frequency    BID      PT Plan Current plan remains appropriate    Co-evaluation              AM-PAC PT "6 Clicks" Mobility   Outcome Measure  Help needed turning from your back to your side while in a flat bed without using bedrails?: None Help needed moving from lying on your back to sitting on the side of a flat bed without using bedrails?: A Little Help needed moving to and from a bed to a chair (including a wheelchair)?: A Little Help  needed standing up from a chair using your arms (e.g., wheelchair or bedside chair)?: A Little Help needed to walk in hospital room?: A Little Help needed climbing 3-5 steps with a railing? : A Lot 6 Click Score: 18    End of Session   Activity Tolerance: Patient tolerated treatment well;Patient limited by pain Patient left: in bed;with bed alarm set;with call bell/phone within reach;with SCD's reapplied;with nursing/sitter in room (wound vac intact to RLE) Nurse Communication: Patient requests pain meds PT Visit Diagnosis: Unsteadiness on feet (R26.81);Muscle weakness (generalized) (M62.81);Difficulty in walking, not elsewhere classified (R26.2);Pain Pain - Right/Left: Right Pain - part of body: Hip     Time: 7341-9379 PT Time Calculation (min) (ACUTE ONLY): 10 min  Charges:  $Therapeutic Exercise: 8-22 mins                     Lavone Nian, PT, DPT 09/05/21, 2:59 PM  Waunita Schooner 09/05/2021, 2:58 PM

## 2021-09-05 NOTE — Plan of Care (Signed)
  Problem: Education: Goal: Ability to describe self-care measures that may prevent or decrease complications (Diabetes Survival Skills Education) will improve Outcome: Progressing Goal: Individualized Educational Video(s) Outcome: Progressing   Problem: Coping: Goal: Ability to adjust to condition or change in health will improve Outcome: Progressing   Problem: Fluid Volume: Goal: Ability to maintain a balanced intake and output will improve Outcome: Progressing   Problem: Health Behavior/Discharge Planning: Goal: Ability to identify and utilize available resources and services will improve Outcome: Progressing Goal: Ability to manage health-related needs will improve Outcome: Progressing   Problem: Metabolic: Goal: Ability to maintain appropriate glucose levels will improve Outcome: Progressing   Problem: Nutritional: Goal: Maintenance of adequate nutrition will improve Outcome: Progressing Goal: Progress toward achieving an optimal weight will improve Outcome: Progressing   Problem: Skin Integrity: Goal: Risk for impaired skin integrity will decrease Outcome: Progressing   Problem: Tissue Perfusion: Goal: Adequacy of tissue perfusion will improve Outcome: Progressing   Problem: Education: Goal: Knowledge of the prescribed therapeutic regimen will improve Outcome: Progressing Goal: Understanding of discharge needs will improve Outcome: Progressing Goal: Individualized Educational Video(s) Outcome: Progressing   Problem: Activity: Goal: Ability to avoid complications of mobility impairment will improve Outcome: Progressing Goal: Ability to tolerate increased activity will improve Outcome: Progressing   Problem: Clinical Measurements: Goal: Postoperative complications will be avoided or minimized Outcome: Progressing   Problem: Pain Management: Goal: Pain level will decrease with appropriate interventions Outcome: Progressing   Problem: Skin  Integrity: Goal: Will show signs of wound healing Outcome: Progressing   Problem: Education: Goal: Knowledge of General Education information will improve Description: Including pain rating scale, medication(s)/side effects and non-pharmacologic comfort measures Outcome: Progressing   Problem: Health Behavior/Discharge Planning: Goal: Ability to manage health-related needs will improve Outcome: Progressing   Problem: Clinical Measurements: Goal: Ability to maintain clinical measurements within normal limits will improve Outcome: Progressing Goal: Will remain free from infection Outcome: Progressing Goal: Diagnostic test results will improve Outcome: Progressing Goal: Respiratory complications will improve Outcome: Progressing Goal: Cardiovascular complication will be avoided Outcome: Progressing   Problem: Activity: Goal: Risk for activity intolerance will decrease Outcome: Progressing   Problem: Nutrition: Goal: Adequate nutrition will be maintained Outcome: Progressing   Problem: Coping: Goal: Level of anxiety will decrease Outcome: Progressing   Problem: Elimination: Goal: Will not experience complications related to bowel motility Outcome: Progressing Goal: Will not experience complications related to urinary retention Outcome: Progressing   Problem: Pain Managment: Goal: General experience of comfort will improve Outcome: Progressing   Problem: Safety: Goal: Ability to remain free from injury will improve Outcome: Progressing   Problem: Skin Integrity: Goal: Risk for impaired skin integrity will decrease Outcome: Progressing   

## 2021-09-05 NOTE — Progress Notes (Signed)
   Subjective: 1 Day Post-Op Procedure(s) (LRB): IRRIGATION AND DEBRIDEMENT ANTERIOR HIP (Right) ANTERIOR HIP REVISION (Right) APPLICATION OF WOUND VAC (Right) Patient reports pain as mild.  Improved from yesterday Patient is well, and has had no acute complaints or problems Denies any CP, SOB, ABD pain. We will continue therapy today.  Plan is to go Home after hospital stay.  Objective: Vital signs in last 24 hours: Temp:  [97.3 F (36.3 C)-99.1 F (37.3 C)] 99.1 F (37.3 C) (08/25 0818) Pulse Rate:  [52-74] 73 (08/25 0818) Resp:  [12-20] 15 (08/25 0818) BP: (99-173)/(53-71) 99/53 (08/25 0818) SpO2:  [97 %-100 %] 99 % (08/25 0818) Weight:  [75.2 kg] 75.2 kg (08/25 0740)  Intake/Output from previous day: 08/24 0701 - 08/25 0700 In: 9381 [P.O.:240; I.V.:1100] Out: 1550 [Urine:1250; Blood:300] Intake/Output this shift: No intake/output data recorded.  Recent Labs    09/04/21 1113 09/05/21 0327  HGB 9.7* 8.3*   Recent Labs    09/04/21 1113 09/05/21 0327  WBC 6.4 9.3  RBC 3.23* 2.75*  HCT 29.8* 25.3*  PLT 289 249   Recent Labs    09/04/21 1113 09/05/21 0327  NA 138 138  K 4.0 4.6  CL 113* 113*  CO2 19* 21*  BUN 35* 30*  CREATININE 1.84* 1.73*  GLUCOSE 159* 168*  CALCIUM 9.2 8.8*   No results for input(s): "LABPT", "INR" in the last 72 hours.  EXAM General - Patient is Alert, Appropriate, and Oriented Extremity - Neurovascular intact Sensation intact distally Intact pulses distally Dorsiflexion/Plantar flexion intact Dressing - dressing C/D/I and no drainage, Praveena intact without drainage Motor Function - intact, moving foot and toes well on exam.   Past Medical History:  Diagnosis Date   Anemia    Anemia, unspecified    Hgb 11.8 - 04/2013   Arthritis    Cataract    Bilateral   CHF (congestive heart failure) (HCC)    Chronic kidney disease    COPD (chronic obstructive pulmonary disease) (HCC)    DDD (degenerative disc disease), lumbar     Degenerative arthritis    Diabetes mellitus type 2, uncomplicated (HCC)    AODM (A1C 6.9%) 04/2013   Diabetes mellitus without complication (HCC)    Dyspnea    with exertion   Esophagitis    Gout    Uric acid 5.4 - 08/2012   Hypertension    Obesity, unspecified    Sarcoidosis, lung ([place])    reported by pt, Clinically without a biopsy   Sleep apnea    OSA--Use C-PAP    Assessment/Plan:   1 Day Post-Op Procedure(s) (LRB): IRRIGATION AND DEBRIDEMENT ANTERIOR HIP (Right) ANTERIOR HIP REVISION (Right) APPLICATION OF WOUND VAC (Right) Principal Problem:   Infection of prosthetic total hip joint, initial encounter ([place])  Estimated body mass index is 28.46 kg/m as calculated from the following:   Height as of this encounter: 5\' 4"  (1.626 m).   Weight as of this encounter: 75.2 kg. Advance diet Up with therapy Pain controlled  Vital signs are stable.  Blood pressure soft hold losartan today  Acute postop blood loss anemia with underlying chronic anemia -hemoglobin 8.3.  Start iron supplement.  Recheck hemoglobin tomorrow  Appreciate infectious disease input.  Cultures are pending.  Care management to assist with discharge  DVT Prophylaxis - Lovenox, TED hose, and SCDs Weight-Bearing as tolerated to right leg   T. Rachelle Hora, PA-C [place] 09/05/2021, 8:21 AM

## 2021-09-05 NOTE — Anesthesia Postprocedure Evaluation (Signed)
Anesthesia Post Note  Patient: Mackenzie Hill  Procedure(s) Performed: IRRIGATION AND DEBRIDEMENT ANTERIOR HIP (Right) ANTERIOR HIP REVISION (Right: Hip) APPLICATION OF WOUND VAC (Right)  Patient location during evaluation: Nursing Unit Anesthesia Type: Spinal Level of consciousness: awake Respiratory status: spontaneous breathing Cardiovascular status: stable Anesthetic complications: no   No notable events documented.   Last Vitals:  Vitals:   09/04/21 1952 09/05/21 0403  BP:  134/64  Pulse:  74  Resp:  17  Temp:  36.8 C  SpO2: 100% 100%    Last Pain:  Vitals:   09/04/21 2303  PainSc: 4                  Escher Harr Renee Ramus

## 2021-09-05 NOTE — Progress Notes (Signed)
Date of Admission:  09/04/2021     ID: Mackenzie Hill is a 81 y.o. female  Principal Problem:   Infection of prosthetic total hip joint, initial encounter ([place])    Subjective: Says her pain had gotten better last night but she is hurting this morning  Medications:   acetaminophen  1,000 mg Oral Q6H   allopurinol  100 mg Oral Daily   amLODipine  5 mg Oral Daily   ascorbic acid  500 mg Oral Daily   aspirin EC  81 mg Oral Daily   calcium-vitamin D  2 tablet Oral Daily   cyanocobalamin  1,000 mcg Oral Daily   docusate sodium  100 mg Oral BID   enoxaparin (LOVENOX) injection  30 mg Subcutaneous Q24H   febuxostat  40 mg Oral Daily   ferrous [id number] C-folic acid  1 capsule Oral BID PC   fluticasone  1 spray Each Nare Daily   fluticasone furoate-vilanterol  1 puff Inhalation Daily   glipiZIDE  10 mg Oral BID   insulin aspart  0-15 Units Subcutaneous TID WC   isosorbide mononitrate  180 mg Oral Daily   linagliptin  5 mg Oral Daily   loratadine  10 mg Oral Daily   [START ON 09/06/2021] losartan  100 mg Oral Daily   magnesium hydroxide  30 mL Oral Daily   montelukast  10 mg Oral QPM   oxybutynin  5 mg Oral BID   pantoprazole  80 mg Oral Daily   spironolactone  25 mg Oral Daily   tiotropium  18 mcg Inhalation Daily    Objective: Vital signs in last 24 hours: Temp:  [97.3 F (36.3 C)-99.1 F (37.3 C)] 99.1 F (37.3 C) (08/25 0818) Pulse Rate:  [52-74] 73 (08/25 0818) Resp:  [12-20] 15 (08/25 0818) BP: (99-173)/(53-71) 99/53 (08/25 0818) SpO2:  [97 %-100 %] 99 % (08/25 0818) Weight:  [75.2 kg] 75.2 kg (08/25 0740)   PHYSICAL EXAM:  General: Alert, cooperative, no distress, appears stated age.  Lungs: Clear to auscultation bilaterally. No Wheezing or Rhonchi. No rales. Heart: Regular rate and rhythm, no murmur, rub or gallop. Abdomen: Soft, non-tender,not distended. Bowel sounds normal. No masses Extremities:rt hip- wound vac Skin: No rashes or lesions. Or  bruising Lymph: Cervical, supraclavicular normal. Neurologic: Grossly non-focal  Lab Results Recent Labs    09/04/21 1113 09/05/21 0327  WBC 6.4 9.3  HGB 9.7* 8.3*  HCT 29.8* 25.3*  NA 138 138  K 4.0 4.6  CL 113* 113*  CO2 19* 21*  BUN 35* 30*  CREATININE 1.84* 1.73*   Liver Panel Recent Labs    09/04/21 1113  PROT 7.1  ALBUMIN 3.6  AST 26  ALT 19  ALKPHOS 107  BILITOT 0.7    Microbiology: 6 cultures- all are negative so far Studies/Results: DG Hip Port Unilat With Pelvis 1V Right  Result Date: 09/04/2021 CLINICAL DATA:  Right total hip revision. EXAM: DG HIP (WITH OR WITHOUT PELVIS) 1V PORT RIGHT COMPARISON:  None Available. FINDINGS: Right total hip arthroplasty is in place. The prosthesis appears located. Gas is present in the joint. A drain is in place. Skin staples are in place. IMPRESSION: Right total hip arthroplasty without radiographic evidence for complication. Electronically Signed   By: San Morelle M.D.   On: 09/04/2021 16:46     Assessment/Plan: Rt hip prosthetic joint pain - s/p I/D and wash out  Multiple cultures sent-Negative so far Currently on cefazolin and being treated like an infection  because of aspirate cell count being 16 K which is not very high for a bacterial septic joint Doubt whether this is an infection Prior to this her last antibiotic was in June 06/20/21 X 10 days- levaquin Will discuss with Dr.Menz  IV VS PO antibiotic on discharge Will check ESR/CRP    CKD- this may cause an issue for PICC placemnt- need to get nephrology clearance. Discussed with Dr.Singh - okay to do PICC If needed   DM-on glipizide   Anemia   ?Gout on allopurinol and Uloric   HTN on amlodipine and losartan, spirinolactone  Discussed the management with the patient

## 2021-09-05 NOTE — Evaluation (Signed)
Occupational Therapy Evaluation Patient Details Name: Mackenzie Hill MRN: [id number] DOB: 20-Jun-1940 Today's Date: 09/05/2021   History of Present Illness Pt is an 81 y/o F admitted on 09/04/21 for I&D of R anterior hip & anterior hip revision 2/2 infected total hip. PMH: DM2, HTN, gout, CKD, R hip replacement (2018), lumbar DDD, cataracts, gout   Clinical Impression   Pt seen for OT evaluation this date. Prior to admission, pt was independent in all ADLs and functional mobility, living in a 1-story home with son. Pt currently requires MAX assist for  LB dressing, MIN GUARD for functional mobility of short household distances with RW, and MIN GUARD for standing grooming tasks due to current functional impairments (See OT Problem List below). Pt would benefit from additional skilled OT services to maximize return to PLOF and minimize risk of future falls, injury, caregiver burden, and readmission. Upon discharge, recommend [facility] services.         Recommendations for follow up therapy are one component of a multi-disciplinary discharge planning process, led by the attending physician.  Recommendations may be updated based on patient status, additional functional criteria and insurance authorization.   Follow Up Recommendations  Home health OT    Assistance Recommended at Discharge Intermittent Supervision/Assistance  Patient can return home with the following A lot of help with bathing/dressing/bathroom;Assistance with cooking/housework;A little help with walking and/or transfers;Assist for transportation    Functional Status Assessment  Patient has had a recent decline in their functional status and demonstrates the ability to make significant improvements in function in a reasonable and predictable amount of time.  Equipment Recommendations  None recommended by OT       Precautions / Restrictions Precautions Precautions: Fall Restrictions Weight Bearing Restrictions: Yes RLE Weight  Bearing: Weight bearing as tolerated      Mobility Bed Mobility               General bed mobility comments: not observed, pt recieved & left sitting in recliner    Transfers Overall transfer level: Needs assistance Equipment used: Rolling walker (2 wheels) Transfers: Sit to/from Stand Sit to Stand: Min guard           General transfer comment: Requires cues for using UE on armrests for good eccentric control      Balance Overall balance assessment: Needs assistance Sitting-balance support: Feet supported, Bilateral upper extremity supported Sitting balance-Leahy Scale: Fair     Standing balance support: During functional activity, No upper extremity supported Standing balance-Leahy Scale: Fair Standing balance comment: Fair balance standing sinkside                           ADL either performed or assessed with clinical judgement   ADL Overall ADL's : Needs assistance/impaired     Grooming: Wash/dry hands;Min guard;Standing               Lower Body Dressing: Maximal assistance;Sitting/lateral leans Lower Body Dressing Details (indicate cue type and reason): to don/doff socks while seated             Functional mobility during ADLs: Min guard;Rolling walker (2 wheels) (to walk 20 ft total)       Vision Baseline Vision/History: 0 No visual deficits Ability to See in Adequate Light: 0 Adequate Patient Visual Report: No change from baseline              Pertinent Vitals/Pain Pain Assessment Pain Assessment: 0-10 Pain Score: 4  Pain Location: Incision site Pain Descriptors / Indicators: Discomfort Pain Intervention(s): Limited activity within patient's tolerance, Monitored during session, Repositioned, Ice applied        Extremity/Trunk Assessment Upper Extremity Assessment Upper Extremity Assessment: Overall WFL for tasks assessed   Lower Extremity Assessment Lower Extremity Assessment: Generalized weakness RLE Deficits  / Details: 3-/5 knee extension in sitting       Communication Communication Communication: No difficulties   Cognition Arousal/Alertness: Awake/alert Behavior During Therapy: WFL for tasks assessed/performed Overall Cognitive Status: Within Functional Limits for tasks assessed                                                  Home Living Family/patient expects to be discharged to:: Private residence Living Arrangements: Children Available Help at Discharge: Family;Available PRN/intermittently Type of Home: House Home Access: Stairs to enter [employer] of Steps: 2 Entrance Stairs-Rails: Left Home Layout: One level     Bathroom Shower/Tub: Occupational psychologist: Handicapped height     Home Equipment: Conservation officer, nature (2 wheels);Shower seat;Toilet riser;Grab bars - tub/shower;Adaptive equipment Adaptive Equipment: Reacher        Prior Functioning/Environment Prior Level of Function : Independent/Modified Independent;Driving             Mobility Comments: Pt reports independence without AD, denies falls ADLs Comments: Pt independent with all ADLs. Pt drives        OT Problem List: Decreased strength;Decreased activity tolerance;Impaired balance (sitting and/or standing);Pain      OT Treatment/Interventions: Self-care/ADL training;Therapeutic exercise;DME and/or AE instruction;Therapeutic activities;Patient/family education;Balance training    OT Goals(Current goals can be found in the care plan section) Acute Rehab OT Goals Patient Stated Goal: I stay independent OT Goal Formulation: With patient Time For Goal Achievement: 09/19/21 Potential to Achieve Goals: Good ADL Goals Pt Will Perform Grooming: with modified independence;standing Pt Will Perform Lower Body Dressing: with modified independence;with adaptive equipment;sit to/from stand Pt Will Transfer to Toilet: with modified independence;ambulating;bedside commode   OT Frequency: Min 2X/week       AM-PAC OT "6 Clicks" Daily Activity     Outcome Measure Help from another person eating meals?: None Help from another person taking care of personal grooming?: A Little Help from another person toileting, which includes using toliet, bedpan, or urinal?: A Little Help from another person bathing (including washing, rinsing, drying)?: A Lot Help from another person to put on and taking off regular upper body clothing?: None Help from another person to put on and taking off regular lower body clothing?: A Lot 6 Click Score: 18   End of Session Equipment Utilized During Treatment: Rolling walker (2 wheels) Nurse Communication: Mobility status  Activity Tolerance: Patient tolerated treatment well Patient left: in chair;with call bell/phone within reach  OT Visit Diagnosis: Unsteadiness on feet (R26.81);Muscle weakness (generalized) (M62.81);Pain Pain - Right/Left: Right Pain - part of body: Hip                Time: 1000-1026 OT Time Calculation (min): 26 min Charges:  OT General Charges $OT Visit: 1 Visit OT Evaluation $OT Eval Moderate Complexity: 1 Mod OT Treatments $Self Care/Home Management : 8-22 mins  Fredirick Maudlin, OTR/L [place]

## 2021-09-06 LAB — CBC
HCT: 23.5 % — ABNORMAL LOW (ref 36.0–46.0)
Hemoglobin: 7.7 g/dL — ABNORMAL LOW (ref 12.0–15.0)
MCH: 30.6 pg (ref 26.0–34.0)
MCHC: 32.8 g/dL (ref 30.0–36.0)
MCV: 93.3 fL (ref 80.0–100.0)
Platelets: 226 10*3/uL (ref 150–400)
RBC: 2.52 MIL/uL — ABNORMAL LOW (ref 3.87–5.11)
RDW: 14.3 % (ref 11.5–15.5)
WBC: 10.9 10*3/uL — ABNORMAL HIGH (ref 4.0–10.5)
nRBC: 0 % (ref 0.0–0.2)

## 2021-09-06 LAB — SEDIMENTATION RATE: Sed Rate: 71 mm/hr — ABNORMAL HIGH (ref 0–30)

## 2021-09-06 LAB — C-REACTIVE PROTEIN: CRP: 19.9 mg/dL — ABNORMAL HIGH (ref <1.0)

## 2021-09-06 LAB — GLUCOSE, CAPILLARY: Glucose-Capillary: 135 mg/dL — ABNORMAL HIGH (ref 70–99)

## 2021-09-06 NOTE — TOC Progression Note (Signed)
Transition of Care [facility]) - Progression Note    Patient Details  Name: Mackenzie Hill MRN: [id number] Date of Birth: Jan 09, 1941  Transition [facility]) CM/SW Contact  Eliza Grissinger [place], [place] Phone Number: 09/06/2021, 1:07 PM  Clinical Narrative:    Met with patient and niece Alric Seton at bedside to discuss change of recommendation to SNF. Patient agreeable, has no preference  however does not want to go to [facility]. Patient states that she resides with her son, has a cain and walker at home and is followed by Dr. Myrle Sheng). [facility] uses [facility] on [address]. Bed search started, will review bed offers once received for choice.  Transition of Care to continue to follow  [facility], LCSW Transition of Care [phone]         Expected Discharge Plan and Services                                                 Social Determinants of Health (SDOH) Interventions    Readmission Risk Interventions     No data to display

## 2021-09-06 NOTE — Plan of Care (Signed)
  Problem: Education: Goal: Ability to describe self-care measures that may prevent or decrease complications (Diabetes Survival Skills Education) will improve Outcome: Progressing Goal: Individualized Educational Video(s) Outcome: Progressing   Problem: Coping: Goal: Ability to adjust to condition or change in health will improve Outcome: Progressing   Problem: Fluid Volume: Goal: Ability to maintain a balanced intake and output will improve Outcome: Progressing   Problem: Health Behavior/Discharge Planning: Goal: Ability to identify and utilize available resources and services will improve Outcome: Progressing Goal: Ability to manage health-related needs will improve Outcome: Progressing   Problem: Metabolic: Goal: Ability to maintain appropriate glucose levels will improve Outcome: Progressing   Problem: Nutritional: Goal: Maintenance of adequate nutrition will improve Outcome: Progressing Goal: Progress toward achieving an optimal weight will improve Outcome: Progressing   Problem: Skin Integrity: Goal: Risk for impaired skin integrity will decrease Outcome: Progressing   Problem: Tissue Perfusion: Goal: Adequacy of tissue perfusion will improve Outcome: Progressing   Problem: Education: Goal: Knowledge of the prescribed therapeutic regimen will improve Outcome: Progressing Goal: Understanding of discharge needs will improve Outcome: Progressing Goal: Individualized Educational Video(s) Outcome: Progressing   Problem: Activity: Goal: Ability to avoid complications of mobility impairment will improve Outcome: Progressing Goal: Ability to tolerate increased activity will improve Outcome: Progressing   Problem: Clinical Measurements: Goal: Postoperative complications will be avoided or minimized Outcome: Progressing   Problem: Pain Management: Goal: Pain level will decrease with appropriate interventions Outcome: Progressing   Problem: Skin  Integrity: Goal: Will show signs of wound healing Outcome: Progressing   Problem: Education: Goal: Knowledge of General Education information will improve Description: Including pain rating scale, medication(s)/side effects and non-pharmacologic comfort measures Outcome: Progressing   Problem: Health Behavior/Discharge Planning: Goal: Ability to manage health-related needs will improve Outcome: Progressing   Problem: Clinical Measurements: Goal: Ability to maintain clinical measurements within normal limits will improve Outcome: Progressing Goal: Will remain free from infection Outcome: Progressing Goal: Diagnostic test results will improve Outcome: Progressing Goal: Respiratory complications will improve Outcome: Progressing Goal: Cardiovascular complication will be avoided Outcome: Progressing   Problem: Activity: Goal: Risk for activity intolerance will decrease Outcome: Progressing   Problem: Nutrition: Goal: Adequate nutrition will be maintained Outcome: Progressing   Problem: Coping: Goal: Level of anxiety will decrease Outcome: Progressing   Problem: Elimination: Goal: Will not experience complications related to bowel motility Outcome: Progressing Goal: Will not experience complications related to urinary retention Outcome: Progressing   Problem: Pain Managment: Goal: General experience of comfort will improve Outcome: Progressing   Problem: Safety: Goal: Ability to remain free from injury will improve Outcome: Progressing   Problem: Skin Integrity: Goal: Risk for impaired skin integrity will decrease Outcome: Progressing   

## 2021-09-06 NOTE — Progress Notes (Signed)
Physical Therapy Treatment Patient Details Name: Mackenzie Hill MRN: [id number] DOB: September 13, 1940 Today's Date: 09/06/2021   History of Present Illness Pt is an 81 y/o F admitted on 09/04/21 for I&D of R anterior hip & anterior hip revision 2/2 infected total hip. PMH: DM2, HTN, gout, CKD, R hip replacement (2018), lumbar DDD, cataracts, gout    PT Comments    Pt seen for PT tx with pt agreeable to tx. Pt is able to complete STS with min assist but requires ongoing cuing for safe foot placement, hand placement & sequencing but pt continues to lean laterally to L before uprighting self, with minimal anterior weight shift & weight bearing through RLE. Pt completes stand pivot to [facility] & has continent void. Pt is then able to ambulate to the hallway & back with cuing for positioning in relation to AD, need for increased foot clearance RLE, & min cuing for safe hand placement. Due to slow progress with functional mobility, continue to recommend STR upon d/c & pt in agreement.    Recommendations for follow up therapy are one component of a multi-disciplinary discharge planning process, led by the attending physician.  Recommendations may be updated based on patient status, additional functional criteria and insurance authorization.  Follow Up Recommendations  Skilled nursing-short term rehab (<3 hours/day) Can patient physically be transported by private vehicle: No   Assistance Recommended at Discharge Frequent or constant Supervision/Assistance  Patient can return home with the following A little help with walking and/or transfers;Assist for transportation;Assistance with cooking/housework;Help with stairs or ramp for entrance   Equipment Recommendations  BSC/3in1    Recommendations for Other Services       Precautions / Restrictions Precautions Precautions: Fall Restrictions Weight Bearing Restrictions: Yes RLE Weight Bearing: Weight bearing as tolerated     Mobility  Bed Mobility                General bed mobility comments: pt received & left up in recliner    Transfers Overall transfer level: Needs assistance Equipment used: Rolling walker (2 wheels) Transfers: Sit to/from Stand, Bed to chair/wheelchair/BSC Sit to Stand: Min assist   Step pivot transfers: Min assist       General transfer comment: Pt requires ongoing cuing for safe hand placement during STS with pt continuing to demonstrate decreased anterior weight shift & decreased weight bearing through RLE. Pt transfers STS by leaning laterally to L then uprighting self.    Ambulation/Gait Ambulation/Gait assistance: Min assist, Min guard Gait Distance (Feet): 28 Feet Assistive device: Rolling walker (2 wheels) Gait Pattern/deviations: Decreased step length - right, Decreased step length - left, Decreased dorsiflexion - right, Decreased stride length, Decreased weight shift to right Gait velocity: decreased     General Gait Details: Pt shuffles R foot forward 50% of the time, requires less cuing that this morning for proper hand placement, requires cuing for positioning within center of AD.   Stairs             Wheelchair Mobility    Modified Rankin (Stroke Patients Only)       Balance Overall balance assessment: Needs assistance Sitting-balance support: Feet supported, Bilateral upper extremity supported Sitting balance-Leahy Scale: Fair     Standing balance support: Single extremity supported, During functional activity, Reliant on assistive device for balance Standing balance-Leahy Scale: Fair Standing balance comment: Pt able to perform peri hygiene in standing with 1UE support & min assist.  Cognition Arousal/Alertness: Awake/alert Behavior During Therapy: Flat affect Overall Cognitive Status: Within Functional Limits for tasks assessed                                          Exercises General Exercises - Lower  Extremity Short Arc Quad: AROM, Strengthening, Right, 10 reps Long Arc Quad:  (pt unable to complete AROM 2/2 weakness) Heel Slides: AAROM, Right, Strengthening, 10 reps, Supine (pt with splaying of R hip, difficulty maintaining neutral/midline alignment)    General Comments General comments (skin integrity, edema, etc.): pt with continent void on [facility]      Pertinent Vitals/Pain Pain Assessment Pain Assessment: Faces Faces Pain Scale: Hurts a little bit Pain Location: R hip Pain Descriptors / Indicators: Discomfort, Grimacing Pain Intervention(s): Monitored during session    Home Living                          Prior Function            PT Goals (current goals can now be found in the care plan section) Acute Rehab PT Goals Patient Stated Goal: get better, go home, decreased pain PT Goal Formulation: With patient Time For Goal Achievement: 09/19/21 Potential to Achieve Goals: Fair Progress towards PT goals: Progressing toward goals    Frequency    BID      PT Plan Current plan remains appropriate    Co-evaluation              AM-PAC PT "6 Clicks" Mobility   Outcome Measure  Help needed turning from your back to your side while in a flat bed without using bedrails?: None Help needed moving from lying on your back to sitting on the side of a flat bed without using bedrails?: A Little Help needed moving to and from a bed to a chair (including a wheelchair)?: A Little Help needed standing up from a chair using your arms (e.g., wheelchair or bedside chair)?: A Little Help needed to walk in hospital room?: A Little Help needed climbing 3-5 steps with a railing? : A Lot 6 Click Score: 18    End of Session Equipment Utilized During Treatment: Gait belt Activity Tolerance: Patient tolerated treatment well Patient left: in chair;with chair alarm set;with call bell/phone within reach (R hip wound vac intact)   PT Visit Diagnosis: Unsteadiness on feet  (R26.81);Muscle weakness (generalized) (M62.81);Difficulty in walking, not elsewhere classified (R26.2)     Time: 1321-1340 PT Time Calculation (min) (ACUTE ONLY): 19 min  Charges:  $Therapeutic Activity: 8-22 mins                     Lavone Nian, PT, DPT 09/06/21, 1:51 PM  Waunita Schooner 09/06/2021, 1:50 PM

## 2021-09-06 NOTE — Progress Notes (Signed)
   Subjective: 2 Days Post-Op Procedure(s) (LRB): IRRIGATION AND DEBRIDEMENT ANTERIOR HIP (Right) ANTERIOR HIP REVISION (Right) APPLICATION OF WOUND VAC (Right) Patient reports pain as mild.  Patient is well, and has had no acute complaints or problems Denies any CP, SOB, ABD pain. We will continue therapy today.  Plan is to go Home after hospital stay.  Objective: Vital signs in last 24 hours: Temp:  [97.6 F (36.4 C)-99.4 F (37.4 C)] 99.4 F (37.4 C) (08/26 0751) Pulse Rate:  [57-72] 69 (08/26 0751) Resp:  [16-20] 16 (08/26 0751) BP: (117-132)/(60-80) 117/65 (08/26 0751) SpO2:  [97 %-100 %] 97 % (08/26 0751)  Intake/Output from previous day: 08/25 0701 - 08/26 0700 In: 328.4 [I.V.:28.3; IV Piggyback:300.1] Out: 600 [Urine:600] Intake/Output this shift: No intake/output data recorded.  Recent Labs    09/04/21 1113 09/05/21 0327 09/06/21 0457  HGB 9.7* 8.3* 7.7*   Recent Labs    09/05/21 0327 09/06/21 0457  WBC 9.3 10.9*  RBC 2.75* 2.52*  HCT 25.3* 23.5*  PLT 249 226   Recent Labs    09/04/21 1113 09/05/21 0327  NA 138 138  K 4.0 4.6  CL 113* 113*  CO2 19* 21*  BUN 35* 30*  CREATININE 1.84* 1.73*  GLUCOSE 159* 168*  CALCIUM 9.2 8.8*   No results for input(s): "LABPT", "INR" in the last 72 hours.  EXAM General - Patient is Alert, Appropriate, and Oriented Extremity - Neurovascular intact Sensation intact distally Intact pulses distally Dorsiflexion/Plantar flexion intact Dressing - dressing C/D/I and no drainage, Praveena intact with scant drainage Motor Function - intact, moving foot and toes well on exam.   Past Medical History:  Diagnosis Date   Anemia    Anemia, unspecified    Hgb 11.8 - 04/2013   Arthritis    Cataract    Bilateral   CHF (congestive heart failure) (HCC)    Chronic kidney disease    COPD (chronic obstructive pulmonary disease) (HCC)    DDD (degenerative disc disease), lumbar    Degenerative arthritis    Diabetes  mellitus type 2, uncomplicated (HCC)    AODM (A1C 6.9%) 04/2013   Diabetes mellitus without complication (HCC)    Dyspnea    with exertion   Esophagitis    Gout    Uric acid 5.4 - 08/2012   Hypertension    Obesity, unspecified    Sarcoidosis, lung ([place])    reported by pt, Clinically without a biopsy   Sleep apnea    OSA--Use C-PAP    Assessment/Plan:   2 Days Post-Op Procedure(s) (LRB): IRRIGATION AND DEBRIDEMENT ANTERIOR HIP (Right) ANTERIOR HIP REVISION (Right) APPLICATION OF WOUND VAC (Right) Principal Problem:   Infection of prosthetic total hip joint, initial encounter ([place])  Estimated body mass index is 28.46 kg/m as calculated from the following:   Height as of this encounter: 5\' 4"  (1.626 m).   Weight as of this encounter: 75.2 kg. Advance diet Up with therapy Pain well controlled  Vital signs are stable.  Blood pressure soft hold losartan / norvasc today  Acute postop blood loss anemia with underlying chronic anemia -hemoglobin 7.7.  continue with iron supplement.  Recheck hemoglobin tomorrow  Appreciate infectious disease input.  Cultures are pending. Currently no growth  Care management to assist with discharge  DVT Prophylaxis - Lovenox, TED hose, and SCDs Weight-Bearing as tolerated to right leg   T. Rachelle Hora, PA-C [place] 09/06/2021, 10:06 AM

## 2021-09-06 NOTE — NC FL2 (Signed)
[place] LEVEL OF CARE SCREENING TOOL     IDENTIFICATION  Patient Name: Mackenzie Hill Birthdate: [birth year] Sex: female Admission Date (Current Location): 09/04/2021  [facility] and [place] Number:  Engineering geologist and Address:  [facility], [address], [place], [place] [address]      Provider Number:    Attending Physician Name and Address:  Hessie Knows, MD  Relative Name and Phone Number:  Alric Seton [phone]    Current Level of Care: SNF Recommended Level of Care: [place] Prior Approval Number:    Date Approved/Denied:   PASRR Number: [id number]  Discharge Plan: SNF    Current Diagnoses: Patient Active Problem List   Diagnosis Date Noted   Infection of prosthetic total hip joint, initial encounter ([place]) 09/04/2021   Anemia 10/17/2020   Degenerative arthritis 10/17/2020   Esophagitis 10/17/2020   Gout 10/17/2020   Obesity 10/17/2020   Pain due to onychomycosis of toenails of both feet 10/17/2020   Benign hypertensive kidney disease with chronic kidney disease 03/14/2019   Chronic kidney disease, stage IV (severe) ([place]) 03/14/2019   Proteinuria 03/14/2019   Secondary hyperparathyroidism of renal origin ([place]) 03/14/2019   COPD (chronic obstructive pulmonary disease) ([place]) 08/11/2016   Primary localized osteoarthritis of right hip 07/21/2016   Colon cancer screening 12/17/2015   Chronic gouty arthropathy without tophi 04/10/2015   Arthralgia of multiple sites 02/27/2015   Great toe pain, left 02/27/2015   OSA on CPAP 00/86/7619   Diastolic dysfunction 50/93/2671   DOE (dyspnea on exertion) 02/20/2014   Diabetes ([place]) 06/19/2013   Essential (primary) hypertension 06/19/2013    Orientation RESPIRATION BLADDER Height & Weight     Self, Time, Situation, Place  Normal Incontinent Weight: 165 lb 12.6 oz (75.2 kg) Height:  5\' 4"  (162.6 cm)  BEHAVIORAL SYMPTOMS/MOOD NEUROLOGICAL BOWEL  NUTRITION STATUS      Continent Diet  AMBULATORY STATUS COMMUNICATION OF NEEDS Skin   Limited Assist Verbally Normal                       Personal Care Assistance Level of Assistance  Bathing, Feeding, Dressing Bathing Assistance: Limited assistance Feeding assistance: Independent Dressing Assistance: Limited assistance     Functional Limitations Info  Sight, Hearing, Speech Sight Info: Adequate Hearing Info: Adequate Speech Info: Adequate    SPECIAL CARE FACTORS FREQUENCY  PT (By licensed PT), OT (By licensed OT)     PT Frequency: 5x per week OT Frequency: 5x per week            Contractures Contractures Info: Not present    Additional Factors Info  Code Status, Allergies Code Status Info: full code Allergies Info: (Hydralazine, Meloxicam, Penicillins, Pioglitazone, Tramadol)           Current Medications (09/06/2021):  This is the current hospital active medication list Current Facility-Administered Medications  Medication Dose Route Frequency Provider Last Rate Last Admin   0.9 %  sodium chloride infusion   Intravenous Continuous Hessie Knows, MD 10 mL/hr at 09/06/21 0921 Rate Change at 09/06/21 0921   acetaminophen (TYLENOL) tablet 325-650 mg  325-650 mg Oral Q6H PRN Hessie Knows, MD   650 mg at 09/06/21 0916   albuterol (VENTOLIN HFA) 108 (90 Base) MCG/ACT inhaler 2 puff  2 puff Inhalation Q6H PRN Hessie Knows, MD       allopurinol (ZYLOPRIM) tablet 100 mg  100 mg Oral Daily Hessie Knows, MD   100  mg at 09/06/21 0917   alum & mag hydroxide-simeth (MAALOX/MYLANTA) 200-200-20 MG/5ML suspension 30 mL  30 mL Oral Q4H PRN Hessie Knows, MD       amLODipine (NORVASC) tablet 5 mg  5 mg Oral Daily Duanne Guess, PA-C   5 mg at 09/06/21 6226   ascorbic acid (VITAMIN C) tablet 500 mg  500 mg Oral Daily Hessie Knows, MD   500 mg at 09/06/21 3335   aspirin EC tablet 81 mg  81 mg Oral Daily Hessie Knows, MD   81 mg at 09/06/21 4562   bisacodyl (DULCOLAX) EC  tablet 5 mg  5 mg Oral Daily PRN Hessie Knows, MD   5 mg at 09/06/21 0543   calcium-vitamin D (OSCAL WITH D) 500-5 MG-MCG per tablet 2 tablet  2 tablet Oral Daily Hessie Knows, MD   2 tablet at 09/06/21 0915   ceFAZolin (ANCEF) IVPB 2g/100 mL premix  2 g Intravenous Q12H Benita Gutter, RPH 200 mL/hr at 09/06/21 0920 2 g at 09/06/21 0920   cyanocobalamin (VITAMIN B12) tablet 1,000 mcg  1,000 mcg Oral Daily Hessie Knows, MD   1,000 mcg at 09/06/21 5638   docusate sodium (COLACE) capsule 100 mg  100 mg Oral BID Hessie Knows, MD   100 mg at 09/06/21 0916   enoxaparin (LOVENOX) injection 30 mg  30 mg Subcutaneous Q24H Hessie Knows, MD   30 mg at 09/06/21 9373   febuxostat (ULORIC) tablet 40 mg  40 mg Oral Daily Hessie Knows, MD   40 mg at 09/06/21 4287   ferrous [id number] C-folic acid (TRINSICON / FOLTRIN) capsule 1 capsule  1 capsule Oral BID PC Hessie Knows, MD   1 capsule at 09/06/21 0917   fluticasone (FLONASE) 50 MCG/ACT nasal spray 1 spray  1 spray Each Nare Daily Hessie Knows, MD   1 spray at 09/06/21 0917   fluticasone furoate-vilanterol (BREO ELLIPTA) 200-25 MCG/ACT 1 puff  1 puff Inhalation Daily Hessie Knows, MD   1 puff at 09/06/21 0919   glipiZIDE (GLUCOTROL XL) 24 hr tablet 10 mg  10 mg Oral BID Hessie Knows, MD   10 mg at 09/06/21 3845   HYDROmorphone (DILAUDID) injection 0.5-1 mg  0.5-1 mg Intravenous Q4H PRN Hessie Knows, MD   1 mg at 09/05/21 1449   insulin aspart (novoLOG) injection 0-15 Units  0-15 Units Subcutaneous TID WC Hessie Knows, MD       ipratropium-albuterol (DUONEB) 0.5-2.5 (3) MG/3ML nebulizer solution 3 mL  3 mL Nebulization Q6H PRN Hessie Knows, MD       isosorbide mononitrate (IMDUR) 24 hr tablet 180 mg  180 mg Oral Daily Hessie Knows, MD   180 mg at 09/06/21 0916   linagliptin (TRADJENTA) tablet 5 mg  5 mg Oral Daily Hessie Knows, MD   5 mg at 09/06/21 0917   loratadine (CLARITIN) tablet 10 mg  10 mg Oral Daily Hessie Knows, MD   10 mg at  09/06/21 0916   losartan (COZAAR) tablet 100 mg  100 mg Oral Daily Duanne Guess, PA-C   100 mg at 09/06/21 3646   magnesium hydroxide (MILK OF MAGNESIA) suspension 30 mL  30 mL Oral Daily Hessie Knows, MD   30 mL at 09/06/21 0919   menthol-cetylpyridinium (CEPACOL) lozenge 3 mg  1 lozenge Oral PRN Hessie Knows, MD       Or   phenol (CHLORASEPTIC) mouth spray 1 spray  1 spray Mouth/Throat PRN Hessie Knows, MD  methocarbamol (ROBAXIN) tablet 500 mg  500 mg Oral Q6H PRN Hessie Knows, MD   500 mg at 09/06/21 0915   Or   methocarbamol (ROBAXIN) 500 mg in dextrose 5 % 50 mL IVPB  500 mg Intravenous Q6H PRN Hessie Knows, MD       metoCLOPramide (REGLAN) tablet 5-10 mg  5-10 mg Oral Q8H PRN Hessie Knows, MD       Or   metoCLOPramide (REGLAN) injection 5-10 mg  5-10 mg Intravenous Q8H PRN Hessie Knows, MD   10 mg at 09/06/21 1245   montelukast (SINGULAIR) tablet 10 mg  10 mg Oral QPM Hessie Knows, MD   10 mg at 09/05/21 1705   ondansetron (ZOFRAN) tablet 4 mg  4 mg Oral Q6H PRN Hessie Knows, MD       Or   ondansetron [facility]) injection 4 mg  4 mg Intravenous Q6H PRN Hessie Knows, MD       oxybutynin (DITROPAN) tablet 5 mg  5 mg Oral BID Hessie Knows, MD   5 mg at 09/06/21 7564   oxyCODONE (Oxy IR/ROXICODONE) immediate release tablet 10-15 mg  10-15 mg Oral Q4H PRN Hessie Knows, MD   15 mg at 09/06/21 1245   oxyCODONE (Oxy IR/ROXICODONE) immediate release tablet 5-10 mg  5-10 mg Oral Q4H PRN Hessie Knows, MD   10 mg at 09/06/21 0542   pantoprazole (PROTONIX) EC tablet 80 mg  80 mg Oral Daily Hessie Knows, MD   80 mg at 09/06/21 3329   senna-docusate (Senokot-S) tablet 1 tablet  1 tablet Oral QHS PRN Hessie Knows, MD   1 tablet at 09/05/21 2106   sodium phosphate (FLEET) 7-19 GM/118ML enema 1 enema  1 enema Rectal Once PRN Hessie Knows, MD       spironolactone (ALDACTONE) tablet 25 mg  25 mg Oral Daily Hessie Knows, MD   25 mg at 09/06/21 0916   tiotropium (SPIRIVA) inhalation  capsule ([facility] use ONLY) 18 mcg  18 mcg Inhalation Daily Hessie Knows, MD   18 mcg at 09/06/21 0918   zolpidem (AMBIEN) tablet 5 mg  5 mg Oral QHS PRN Hessie Knows, MD         Discharge Medications: Please see discharge summary for a list of discharge medications.  Relevant Imaging Results:  Relevant Lab Results:   Additional Information SS [id number]  Elliot Gurney [place], [place]

## 2021-09-06 NOTE — Progress Notes (Signed)
Physical Therapy Treatment Patient Details Name: Mackenzie Hill MRN: [id number] DOB: 04/09/1940 Today's Date: 09/06/2021   History of Present Illness Pt is an 81 y/o F admitted on 09/04/21 for I&D of R anterior hip & anterior hip revision 2/2 infected total hip. PMH: DM2, HTN, gout, CKD, R hip replacement (2018), lumbar DDD, cataracts, gout    PT Comments    Pt seen for PT tx with pt agreeable. Pt reports her pain is much better compared to yesterday afternoon & reports need to use bathroom. Pt completes STS with impaired safety awareness, hand & feet placement with PT providing cuing but pt continuing to lean significantly to L and moving sideways before standing upright. PT provides cuing for safe hand & foot placement during STS. After continent void on BSC pt ambulates to door & back with RW & min assist with constant verbal cuing for safe hand placement on RW & gait pattern. Half the time during gait, pt is shuffling RLE to advance it vs picking it up. Pt is making slow progress with safe functional mobility & is unsafe to be home alone when her son is gone for extended times to dialysis & niece is unable to assist. PT educated pt on change in recommendation to STR & pt spoke to niece on telephone during session & niece agrees with rehab. Will continue to follow pt acutely to address RLE strengthening, balance, and gait with LRAD.   Recommendations for follow up therapy are one component of a multi-disciplinary discharge planning process, led by the attending physician.  Recommendations may be updated based on patient status, additional functional criteria and insurance authorization.  Follow Up Recommendations  Skilled nursing-short term rehab (<3 hours/day) Can patient physically be transported by private vehicle: Yes   Assistance Recommended at Discharge Frequent or constant Supervision/Assistance  Patient can return home with the following A little help with walking and/or transfers;Assist  for transportation;Assistance with cooking/housework;Help with stairs or ramp for entrance   Equipment Recommendations  BSC/3in1    Recommendations for Other Services       Precautions / Restrictions Precautions Precautions: Fall Restrictions Weight Bearing Restrictions: Yes RLE Weight Bearing: Weight bearing as tolerated     Mobility  Bed Mobility               General bed mobility comments: pt received & left up in recliner    Transfers Overall transfer level: Needs assistance Equipment used: Rolling walker (2 wheels) Transfers: Sit to/from Stand, Bed to chair/wheelchair/BSC Sit to Stand: Min assist   Step pivot transfers: Min assist       General transfer comment: STS: Pt provides cuing for BLE placement (bring RLE underneath BOS), cuing for safe hand placement to push to standing, cuing for anterior weight shifting. Pt continues to lean laterally to L and transfer STS sideways before standing upright. Pt is able to complete recliner>BSC with RW & CGA<>min assist.    Ambulation/Gait Ambulation/Gait assistance: Min assist Gait Distance (Feet): 27 Feet Assistive device: Rolling walker (2 wheels) Gait Pattern/deviations: Decreased step length - right, Decreased step length - left, Decreased dorsiflexion - right, Decreased stride length, Decreased weight shift to right Gait velocity: decreased     General Gait Details: Pt shuffles R foot forward/laterally to advance it 50% vs picking foot up & stepping despite cuing. PT provides cuing throughout gait for proper hand positioning on RW.   Stairs             Emergency planning/management officer  Modified Rankin (Stroke Patients Only)       Balance Overall balance assessment: Needs assistance Sitting-balance support: Feet supported, Bilateral upper extremity supported Sitting balance-Leahy Scale: Fair     Standing balance support: Single extremity supported, During functional activity, Reliant on assistive device  for balance Standing balance-Leahy Scale: Fair Standing balance comment: Pt able to perform peri hygiene in standing with 1UE support & min assist.                            Cognition Arousal/Alertness: Awake/alert Behavior During Therapy: Flat affect Overall Cognitive Status: Within Functional Limits for tasks assessed                                          Exercises      General Comments General comments (skin integrity, edema, etc.): pt with continent void on [facility]      Pertinent Vitals/Pain Pain Assessment Pain Assessment: 0-10 Faces Pain Scale: Hurts a little bit Pain Location: R hip Pain Descriptors / Indicators: Discomfort Pain Intervention(s): Monitored during session    Home Living                          Prior Function            PT Goals (current goals can now be found in the care plan section) Acute Rehab PT Goals Patient Stated Goal: get better, go home, decreased pain PT Goal Formulation: With patient Time For Goal Achievement: 09/19/21 Potential to Achieve Goals: Good Progress towards PT goals: Progressing toward goals    Frequency    BID      PT Plan Discharge plan needs to be updated    Co-evaluation              AM-PAC PT "6 Clicks" Mobility   Outcome Measure  Help needed turning from your back to your side while in a flat bed without using bedrails?: None Help needed moving from lying on your back to sitting on the side of a flat bed without using bedrails?: A Little Help needed moving to and from a bed to a chair (including a wheelchair)?: A Little Help needed standing up from a chair using your arms (e.g., wheelchair or bedside chair)?: A Little Help needed to walk in hospital room?: A Little Help needed climbing 3-5 steps with a railing? : A Lot 6 Click Score: 18    End of Session Equipment Utilized During Treatment: Gait belt Activity Tolerance: Patient tolerated treatment  well Patient left: in chair;with chair alarm set;with call bell/phone within reach;with SCD's reapplied (wound vac intact to R hip)   PT Visit Diagnosis: Unsteadiness on feet (R26.81);Muscle weakness (generalized) (M62.81);Difficulty in walking, not elsewhere classified (R26.2)     Time: 0814-4818 PT Time Calculation (min) (ACUTE ONLY): 18 min  Charges:  $Therapeutic Activity: 8-22 mins                     Mackenzie Hill, PT, DPT 09/06/21, 11:05 AM   Mackenzie Hill 09/06/2021, 11:03 AM

## 2021-09-07 LAB — CBC
HCT: 23.3 % — ABNORMAL LOW (ref 36.0–46.0)
HCT: 24.5 % — ABNORMAL LOW (ref 36.0–46.0)
Hemoglobin: 7.5 g/dL — ABNORMAL LOW (ref 12.0–15.0)
Hemoglobin: 8 g/dL — ABNORMAL LOW (ref 12.0–15.0)
MCH: 29.8 pg (ref 26.0–34.0)
MCH: 30.3 pg (ref 26.0–34.0)
MCHC: 32.2 g/dL (ref 30.0–36.0)
MCHC: 32.7 g/dL (ref 30.0–36.0)
MCV: 92.5 fL (ref 80.0–100.0)
MCV: 92.8 fL (ref 80.0–100.0)
Platelets: 241 10*3/uL (ref 150–400)
Platelets: 253 10*3/uL (ref 150–400)
RBC: 2.52 MIL/uL — ABNORMAL LOW (ref 3.87–5.11)
RBC: 2.64 MIL/uL — ABNORMAL LOW (ref 3.87–5.11)
RDW: 14.4 % (ref 11.5–15.5)
RDW: 14.4 % (ref 11.5–15.5)
WBC: 10.9 10*3/uL — ABNORMAL HIGH (ref 4.0–10.5)
WBC: 11.4 10*3/uL — ABNORMAL HIGH (ref 4.0–10.5)
nRBC: 0 % (ref 0.0–0.2)
nRBC: 0 % (ref 0.0–0.2)

## 2021-09-07 LAB — GLUCOSE, CAPILLARY
Glucose-Capillary: 93 mg/dL (ref 70–99)
Glucose-Capillary: 197 mg/dL — ABNORMAL HIGH (ref 70–99)
Glucose-Capillary: 163 mg/dL — ABNORMAL HIGH (ref 70–99)
Glucose-Capillary: 142 mg/dL — ABNORMAL HIGH (ref 70–99)

## 2021-09-07 NOTE — Plan of Care (Signed)
  Problem: Education: Goal: Ability to describe self-care measures that may prevent or decrease complications (Diabetes Survival Skills Education) will improve Outcome: Progressing Goal: Individualized Educational Video(s) Outcome: Progressing   Problem: Coping: Goal: Ability to adjust to condition or change in health will improve Outcome: Progressing   Problem: Fluid Volume: Goal: Ability to maintain a balanced intake and output will improve Outcome: Progressing   Problem: Health Behavior/Discharge Planning: Goal: Ability to identify and utilize available resources and services will improve Outcome: Progressing Goal: Ability to manage health-related needs will improve Outcome: Progressing   Problem: Metabolic: Goal: Ability to maintain appropriate glucose levels will improve Outcome: Progressing   Problem: Nutritional: Goal: Maintenance of adequate nutrition will improve Outcome: Progressing Goal: Progress toward achieving an optimal weight will improve Outcome: Progressing   Problem: Skin Integrity: Goal: Risk for impaired skin integrity will decrease Outcome: Progressing   Problem: Tissue Perfusion: Goal: Adequacy of tissue perfusion will improve Outcome: Progressing   Problem: Education: Goal: Knowledge of the prescribed therapeutic regimen will improve Outcome: Progressing Goal: Understanding of discharge needs will improve Outcome: Progressing Goal: Individualized Educational Video(s) Outcome: Progressing   Problem: Activity: Goal: Ability to avoid complications of mobility impairment will improve Outcome: Progressing Goal: Ability to tolerate increased activity will improve Outcome: Progressing   Problem: Clinical Measurements: Goal: Postoperative complications will be avoided or minimized Outcome: Progressing   Problem: Pain Management: Goal: Pain level will decrease with appropriate interventions Outcome: Progressing   Problem: Skin  Integrity: Goal: Will show signs of wound healing Outcome: Progressing   Problem: Education: Goal: Knowledge of General Education information will improve Description: Including pain rating scale, medication(s)/side effects and non-pharmacologic comfort measures Outcome: Progressing   Problem: Health Behavior/Discharge Planning: Goal: Ability to manage health-related needs will improve Outcome: Progressing   Problem: Clinical Measurements: Goal: Ability to maintain clinical measurements within normal limits will improve Outcome: Progressing Goal: Will remain free from infection Outcome: Progressing Goal: Diagnostic test results will improve Outcome: Progressing Goal: Respiratory complications will improve Outcome: Progressing Goal: Cardiovascular complication will be avoided Outcome: Progressing   Problem: Activity: Goal: Risk for activity intolerance will decrease Outcome: Progressing   Problem: Nutrition: Goal: Adequate nutrition will be maintained Outcome: Progressing   Problem: Coping: Goal: Level of anxiety will decrease Outcome: Progressing   Problem: Elimination: Goal: Will not experience complications related to bowel motility Outcome: Progressing Goal: Will not experience complications related to urinary retention Outcome: Progressing   Problem: Pain Managment: Goal: General experience of comfort will improve Outcome: Progressing   Problem: Safety: Goal: Ability to remain free from injury will improve Outcome: Progressing   Problem: Skin Integrity: Goal: Risk for impaired skin integrity will decrease Outcome: Progressing   

## 2021-09-07 NOTE — Progress Notes (Signed)
Subjective: 3 Days Post-Op Procedure(s) (LRB): IRRIGATION AND DEBRIDEMENT ANTERIOR HIP (Right) ANTERIOR HIP REVISION (Right) APPLICATION OF WOUND VAC (Right) Patient reports pain as mild.  Patient is well, and has had no acute complaints or problems Denies any CP, SOB, ABD pain. We will continue therapy today.  Plan is to go Home after hospital stay.  Objective: Vital signs in last 24 hours: Temp:  [98.2 F (36.8 C)-99.2 F (37.3 C)] 98.2 F (36.8 C) (08/27 0746) Pulse Rate:  [58-80] 80 (08/27 0746) Resp:  [17-18] 17 (08/27 0746) BP: (96-164)/(55-63) 160/63 (08/27 0746) SpO2:  [99 %-100 %] 99 % (08/27 0746)  Intake/Output from previous day: 08/26 0701 - 08/27 0700 In: 480 [P.O.:480] Out: 405 [Urine:400; Drains:5] Intake/Output this shift: No intake/output data recorded.  Recent Labs    09/04/21 1113 09/05/21 0327 09/06/21 0457 09/07/21 0554  HGB 9.7* 8.3* 7.7* 7.5*   Recent Labs    09/06/21 0457 09/07/21 0554  WBC 10.9* 10.9*  RBC 2.52* 2.52*  HCT 23.5* 23.3*  PLT 226 241   Recent Labs    09/04/21 1113 09/05/21 0327  NA 138 138  K 4.0 4.6  CL 113* 113*  CO2 19* 21*  BUN 35* 30*  CREATININE 1.84* 1.73*  GLUCOSE 159* 168*  CALCIUM 9.2 8.8*   No results for input(s): "LABPT", "INR" in the last 72 hours.  EXAM General - Patient is Alert, Appropriate, and Oriented Abdomen - soft non tender, non distended Extremity - Neurovascular intact Sensation intact distally Intact pulses distally Dorsiflexion/Plantar flexion intact Dressing - dressing C/D/I and no drainage, Praveena intact with scant drainage Motor Function - intact, moving foot and toes well on exam.   Past Medical History:  Diagnosis Date   Anemia    Anemia, unspecified    Hgb 11.8 - 04/2013   Arthritis    Cataract    Bilateral   CHF (congestive heart failure) (HCC)    Chronic kidney disease    COPD (chronic obstructive pulmonary disease) (HCC)    DDD (degenerative disc disease),  lumbar    Degenerative arthritis    Diabetes mellitus type 2, uncomplicated (HCC)    AODM (A1C 6.9%) 04/2013   Diabetes mellitus without complication (HCC)    Dyspnea    with exertion   Esophagitis    Gout    Uric acid 5.4 - 08/2012   Hypertension    Obesity, unspecified    Sarcoidosis, lung ([place])    reported by pt, Clinically without a biopsy   Sleep apnea    OSA--Use C-PAP    Assessment/Plan:   3 Days Post-Op Procedure(s) (LRB): IRRIGATION AND DEBRIDEMENT ANTERIOR HIP (Right) ANTERIOR HIP REVISION (Right) APPLICATION OF WOUND VAC (Right) Principal Problem:   Infection of prosthetic total hip joint, initial encounter ([place])  Estimated body mass index is 28.46 kg/m as calculated from the following:   Height as of this encounter: 5\' 4"  (1.626 m).   Weight as of this encounter: 75.2 kg. Advance diet Up with therapy Pain well controlled  Vital signs are stable.   Acute postop blood loss anemia with underlying chronic anemia -hemoglobin 7.5.  continue with iron supplement.  Recheck hemoglobin tomorrow  Appreciate infectious disease input.  Cultures are pending. Currently no growth x 2 days  Urinary frequency - will order UA/culture.  Care management to assist with discharge to SNF per PT reccomendations  DVT Prophylaxis - Lovenox, TED hose, and SCDs Weight-Bearing as tolerated to right leg   T. Rachelle Hora,  PA-C [place] 09/07/2021, 9:13 AM

## 2021-09-07 NOTE — Progress Notes (Signed)
Physical Therapy Treatment Patient Details Name: Mackenzie Hill MRN: [id number] DOB: January 27, 1940 Today's Date: 09/07/2021   History of Present Illness Pt is an 81 y/o F admitted on 09/04/21 for I&D of R anterior hip & anterior hip revision 2/2 infected total hip. PMH: DM2, HTN, gout, CKD, R hip replacement (2018), lumbar DDD, cataracts, gout    PT Comments    Pt seen for PT tx with pt agreeable but voicing concerns regarding urinary incontinence but notes MD is aware. Pt noted to be incontinent of urine & assisted recliner>BSC with RW & CGA with pt demonstrating improving hand placement and ability to transfer. Pt with continent void on [facility] & PT assisted with donning clean gown. Pt then ambulates into hallway with improving hand placement, upright posture, and gait pattern. Pt is making good progress with functional mobility on this date. Will continue to follow pt acutely to address RLE strengthening, balance, and gait & stairs with LRAD.     Recommendations for follow up therapy are one component of a multi-disciplinary discharge planning process, led by the attending physician.  Recommendations may be updated based on patient status, additional functional criteria and insurance authorization.  Follow Up Recommendations  Skilled nursing-short term rehab (<3 hours/day) Can patient physically be transported by private vehicle: No   Assistance Recommended at Discharge Frequent or constant Supervision/Assistance  Patient can return home with the following A little help with walking and/or transfers;Assist for transportation;Assistance with cooking/housework;Help with stairs or ramp for entrance   Equipment Recommendations  BSC/3in1    Recommendations for Other Services       Precautions / Restrictions Precautions Precautions: Fall Restrictions Weight Bearing Restrictions: Yes RLE Weight Bearing: Weight bearing as tolerated     Mobility  Bed Mobility               General bed  mobility comments: pt received & left up in recliner    Transfers Overall transfer level: Needs assistance Equipment used: Rolling walker (2 wheels) Transfers: Sit to/from Stand Sit to Stand: Min assist   Step pivot transfers: Min guard       General transfer comment: Pt with improving hand placement and ability to push STS with BUE and come upright vs leaning L. Pt requires cuing for increased eccentric control with stand>sit but good return demo.    Ambulation/Gait Ambulation/Gait assistance: Min guard Gait Distance (Feet): 32 Feet Assistive device: Rolling walker (2 wheels) Gait Pattern/deviations: Decreased stride length, Decreased step length - left, Decreased dorsiflexion - right, Decreased step length - right, Decreased dorsiflexion - left Gait velocity: decreased     General Gait Details: Pt initially shuffles R foot forward but is able to progress to clearing foot & stepping. Pt with improving ability to maintain proper hand placement on RW & slightly improved upright posture.   Stairs             Wheelchair Mobility    Modified Rankin (Stroke Patients Only)       Balance Overall balance assessment: Needs assistance Sitting-balance support: Feet supported, Bilateral upper extremity supported Sitting balance-Leahy Scale: Fair     Standing balance support: Single extremity supported, During functional activity, Reliant on assistive device for balance Standing balance-Leahy Scale: Fair                              Cognition Arousal/Alertness: Awake/alert Behavior During Therapy: Flat affect, WFL for tasks assessed/performed Overall Cognitive Status: Within  Functional Limits for tasks assessed                                 General Comments: pt voices concerns about urinary incontinence but reports MD is aware        Exercises General Exercises - Lower Extremity Long Arc Quad: AROM, Strengthening, Right, 10 reps, Seated  (3-/5 strength/ROM) Hip Flexion/Marching:  (attempts but unable 2/2 ongoing weakness/pain)    General Comments General comments (skin integrity, edema, etc.): Pt with continent void on [facility] after PT found her with incontinent void in recliner. PT assists with donning new gown.      Pertinent Vitals/Pain Pain Assessment Pain Assessment: Faces Pain Score: 5  Pain Location: R hip Pain Descriptors / Indicators: Discomfort, Grimacing Pain Intervention(s): Repositioned, Monitored during session, Limited activity within patient's tolerance    Home Living                          Prior Function            PT Goals (current goals can now be found in the care plan section) Acute Rehab PT Goals Patient Stated Goal: get better, go home, decreased pain PT Goal Formulation: With patient Time For Goal Achievement: 09/19/21 Potential to Achieve Goals: Fair Progress towards PT goals: Progressing toward goals    Frequency    BID      PT Plan Current plan remains appropriate    Co-evaluation              AM-PAC PT "6 Clicks" Mobility   Outcome Measure  Help needed turning from your back to your side while in a flat bed without using bedrails?: None Help needed moving from lying on your back to sitting on the side of a flat bed without using bedrails?: A Little Help needed moving to and from a bed to a chair (including a wheelchair)?: A Little Help needed standing up from a chair using your arms (e.g., wheelchair or bedside chair)?: A Little Help needed to walk in hospital room?: A Little Help needed climbing 3-5 steps with a railing? : A Lot 6 Click Score: 18    End of Session   Activity Tolerance: Patient tolerated treatment well Patient left: in chair;with chair alarm set;with call bell/phone within reach   PT Visit Diagnosis: Unsteadiness on feet (R26.81);Muscle weakness (generalized) (M62.81);Difficulty in walking, not elsewhere classified (R26.2);Pain Pain  - Right/Left: Right Pain - part of body: Hip     Time: 0630-1601 PT Time Calculation (min) (ACUTE ONLY): 23 min  Charges:  $Therapeutic Activity: 23-37 mins                     Mackenzie Hill, PT, DPT 09/07/21, 10:36 AM   Mackenzie Hill 09/07/2021, 10:35 AM

## 2021-09-08 DIAGNOSIS — T8451XD Infection and inflammatory reaction due to internal right hip prosthesis, subsequent encounter: Secondary | ICD-10-CM | POA: Diagnosis not present

## 2021-09-08 LAB — CBC
HCT: 24.5 % — ABNORMAL LOW (ref 36.0–46.0)
Hemoglobin: 8 g/dL — ABNORMAL LOW (ref 12.0–15.0)
MCH: 30.4 pg (ref 26.0–34.0)
MCHC: 32.7 g/dL (ref 30.0–36.0)
MCV: 93.2 fL (ref 80.0–100.0)
Platelets: 296 10*3/uL (ref 150–400)
RBC: 2.63 MIL/uL — ABNORMAL LOW (ref 3.87–5.11)
RDW: 14.3 % (ref 11.5–15.5)
WBC: 10 10*3/uL (ref 4.0–10.5)
nRBC: 0 % (ref 0.0–0.2)

## 2021-09-08 LAB — GLUCOSE, CAPILLARY: Glucose-Capillary: 113 mg/dL — ABNORMAL HIGH (ref 70–99)

## 2021-09-08 MED ORDER — SODIUM CHLORIDE 0.9 % IV SOLN
2.0000 g | INTRAVENOUS | Status: DC
  Administered 2021-09-08 – 2021-09-09 (×2): 2 g via INTRAVENOUS
  Filled 2021-09-08: qty 2
  Filled 2021-09-08: qty 20

## 2021-09-08 NOTE — Progress Notes (Signed)
   Subjective: 4 Days Post-Op Procedure(s) (LRB): IRRIGATION AND DEBRIDEMENT ANTERIOR HIP (Right) ANTERIOR HIP REVISION (Right) APPLICATION OF WOUND VAC (Right) Patient reports pain as mild.  Patient is well, and has had no acute complaints or problems Low grade temp Denies any CP, SOB, ABD pain. We will continue therapy today.  Plan is to go Home after hospital stay.  Objective: Vital signs in last 24 hours: Temp:  [98.6 F (37 C)-100.3 F (37.9 C)] 99.4 F (37.4 C) (08/28 0730) Pulse Rate:  [66-77] 68 (08/28 0730) Resp:  [16-18] 16 (08/28 0730) BP: (131-175)/(60-69) 131/60 (08/28 0730) SpO2:  [98 %-100 %] 100 % (08/28 0730)  Intake/Output from previous day: 08/27 0701 - 08/28 0700 In: 840 [P.O.:840] Out: 100 [Drains:100] Intake/Output this shift: No intake/output data recorded.  Recent Labs    09/06/21 0457 09/07/21 0554 09/07/21 1007 09/08/21 0327  HGB 7.7* 7.5* 8.0* 8.0*   Recent Labs    09/07/21 1007 09/08/21 0327  WBC 11.4* 10.0  RBC 2.64* 2.63*  HCT 24.5* 24.5*  PLT 253 296   No results for input(s): "NA", "K", "CL", "CO2", "BUN", "CREATININE", "GLUCOSE", "CALCIUM" in the last 72 hours.  No results for input(s): "LABPT", "INR" in the last 72 hours.  EXAM General - Patient is Alert, Appropriate, and Oriented Abdomen - soft non tender, non distended Extremity - Neurovascular intact Sensation intact distally Intact pulses distally Dorsiflexion/Plantar flexion intact Dressing - dressing C/D/I and no drainage, Praveena intact with scant drainage Motor Function - intact, moving foot and toes well on exam.   Past Medical History:  Diagnosis Date   Anemia    Anemia, unspecified    Hgb 11.8 - 04/2013   Arthritis    Cataract    Bilateral   CHF (congestive heart failure) (HCC)    Chronic kidney disease    COPD (chronic obstructive pulmonary disease) (HCC)    DDD (degenerative disc disease), lumbar    Degenerative arthritis    Diabetes mellitus  type 2, uncomplicated (HCC)    AODM (A1C 6.9%) 04/2013   Diabetes mellitus without complication (HCC)    Dyspnea    with exertion   Esophagitis    Gout    Uric acid 5.4 - 08/2012   Hypertension    Obesity, unspecified    Sarcoidosis, lung ([place])    reported by pt, Clinically without a biopsy   Sleep apnea    OSA--Use C-PAP    Assessment/Plan:   4 Days Post-Op Procedure(s) (LRB): IRRIGATION AND DEBRIDEMENT ANTERIOR HIP (Right) ANTERIOR HIP REVISION (Right) APPLICATION OF WOUND VAC (Right) Principal Problem:   Infection of prosthetic total hip joint, initial encounter ([place])  Estimated body mass index is 28.46 kg/m as calculated from the following:   Height as of this encounter: 5\' 4"  (1.626 m).   Weight as of this encounter: 75.2 kg. Advance diet Up with therapy Pain well controlled  Vital signs are stable. Low grade temp. Encouraged incentive spirometer  Acute postop blood loss anemia with underlying chronic anemia -hemoglobin 8.0, trending up.  continue with iron supplement.    Appreciate infectious disease input.  Cultures are pending. Currently no growth  Urinary frequency - UA/culture pending  Care management to assist with discharge to SNF per PT reccomendations  DVT Prophylaxis - Lovenox, TED hose, and SCDs Weight-Bearing as tolerated to right leg   T. Rachelle Hora, PA-C [place] 09/08/2021, 8:55 AM

## 2021-09-08 NOTE — Care Management Important Message (Signed)
Important Message  Patient Details  Name: Mackenzie Hill MRN: [id number] Date of Birth: 01-15-1940   Medicare Important Message Given:  Yes     Dannette Barbara 09/08/2021, 12:46 PM

## 2021-09-08 NOTE — Progress Notes (Signed)
   Date of Admission:  09/04/2021     ID: Mackenzie Hill is a 81 y.o. female  Principal Problem:   Infection of prosthetic total hip joint, initial encounter ([place])    Subjective: Says her pain had gotten better last night but she is hurting this morning  Medications:   allopurinol  100 mg Oral Daily   amLODipine  5 mg Oral Daily   ascorbic acid  500 mg Oral Daily   aspirin EC  81 mg Oral Daily   calcium-vitamin D  2 tablet Oral Daily   cyanocobalamin  1,000 mcg Oral Daily   docusate sodium  100 mg Oral BID   enoxaparin (LOVENOX) injection  30 mg Subcutaneous Q24H   febuxostat  40 mg Oral Daily   ferrous [id number] C-folic acid  1 capsule Oral BID PC   fluticasone  1 spray Each Nare Daily   fluticasone furoate-vilanterol  1 puff Inhalation Daily   glipiZIDE  10 mg Oral BID   insulin aspart  0-15 Units Subcutaneous TID WC   isosorbide mononitrate  180 mg Oral Daily   linagliptin  5 mg Oral Daily   loratadine  10 mg Oral Daily   losartan  100 mg Oral Daily   magnesium hydroxide  30 mL Oral Daily   montelukast  10 mg Oral QPM   oxybutynin  5 mg Oral BID   pantoprazole  80 mg Oral Daily   spironolactone  25 mg Oral Daily   tiotropium  18 mcg Inhalation Daily    Objective: Vital signs in last 24 hours: Temp:  [98 F (36.7 C)-100.3 F (37.9 C)] 98 F (36.7 C) (08/28 1749) Pulse Rate:  [61-77] 61 (08/28 1749) Resp:  [16-18] 16 (08/28 1749) BP: (131-175)/(60-69) 149/68 (08/28 1749) SpO2:  [99 %-100 %] 100 % (08/28 1749)   PHYSICAL EXAM:  General: Alert, cooperative, no distress, appears stated age.  Lungs: Clear to auscultation bilaterally. No Wheezing or Rhonchi. No rales. Heart: Regular rate and rhythm, no murmur, rub or gallop. Abdomen: Soft, non-tender,not distended. Bowel sounds normal. No masses Extremities:rt hip- wound vac Skin: No rashes or lesions. Or bruising Lymph: Cervical, supraclavicular normal. Neurologic: Grossly non-focal  Lab  Results Recent Labs    09/07/21 1007 09/08/21 0327  WBC 11.4* 10.0  HGB 8.0* 8.0*  HCT 24.5* 24.5*   Liver Panel No results for input(s): "PROT", "ALBUMIN", "AST", "ALT", "ALKPHOS", "BILITOT", "BILIDIR", "IBILI" in the last 72 hours.   Microbiology: 6 cultures- all are negative so far Studies/Results: No results found.   Assessment/Plan: Rt hip prosthetic joint pain - s/p I/D and wash out  Multiple cultures sent-Negative so far Currently on cefazolin and being treated like an infection because of aspirate cell count being 16 K which is not very high for a bacterial septic joint Doubt whether this is an infection Prior to this her last antibiotic was in June 06/20/21 X 10 days- levaquin Will discuss with Dr.Menz  IV VS PO antibiotic on discharge Will check ESR/CRP    CKD- this may cause an issue for PICC placemnt- need to get nephrology clearance. Discussed with Dr.Singh - okay to do PICC If needed   DM-on glipizide   Anemia   ?Gout on allopurinol and Uloric   HTN on amlodipine and losartan, spirinolactone  Discussed the management with the patient

## 2021-09-08 NOTE — Plan of Care (Signed)
  Problem: Coping: Goal: Ability to adjust to condition or change in health will improve Outcome: Progressing   Problem: Fluid Volume: Goal: Ability to maintain a balanced intake and output will improve Outcome: Progressing   Problem: Health Behavior/Discharge Planning: Goal: Ability to identify and utilize available resources and services will improve Outcome: Progressing Goal: Ability to manage health-related needs will improve Outcome: Progressing   Problem: Skin Integrity: Goal: Risk for impaired skin integrity will decrease Outcome: Progressing   Problem: Tissue Perfusion: Goal: Adequacy of tissue perfusion will improve Outcome: Progressing   

## 2021-09-08 NOTE — Progress Notes (Signed)
Physical Therapy Treatment Patient Details Name: Mackenzie Hill MRN: [id number] DOB: July 30, 1940 Today's Date: 09/08/2021   History of Present Illness Pt is an 81 y/o F admitted on 09/04/21 for I&D of R anterior hip & anterior hip revision 2/2 infected total hip. PMH: DM2, HTN, gout, CKD, R hip replacement (2018), lumbar DDD, cataracts, gout    PT Comments    Pt seen for PT tx with pt agreeable & pt's niece Lars Mage) present for session. Pt c/o increased R hip pain as compared to AM session but has requested pain meds (requested again during session & nurse present at end of session). Pt completes STS initially with poor balance & LOB back onto recliner 2/2 decreased anterior weight shift & decreased weight bearing through RLE with pt attempting to lean L; PT assisted with lowering pt slowly to recliner. Pt successful with STS on 2nd attempt with CGA & ambulates 1 lap around nurses station with RW & CGA fade to supervision. Pt requires min education re: upright posture & to ambulate slightly closer to RW. Pt fatigued after session but needs to void. Pt assisted onto [facility] & left in care of nurse. Pt is making good progress with functional mobility & increasing endurance & strengthening. Continue to recommend HHPT upon d/c.    Recommendations for follow up therapy are one component of a multi-disciplinary discharge planning process, led by the attending physician.  Recommendations may be updated based on patient status, additional functional criteria and insurance authorization.  Follow Up Recommendations  Home health PT Can patient physically be transported by private vehicle: Yes   Assistance Recommended at Discharge Intermittent Supervision/Assistance  Patient can return home with the following A little help with walking and/or transfers;Assist for transportation;Assistance with cooking/housework;Help with stairs or ramp for entrance;A little help with bathing/dressing/bathroom   Equipment  Recommendations  None recommended by PT    Recommendations for Other Services       Precautions / Restrictions Precautions Precautions: Fall Restrictions Weight Bearing Restrictions: Yes RLE Weight Bearing: Weight bearing as tolerated     Mobility  Bed Mobility               General bed mobility comments: pt received & left up in recliner    Transfers Overall transfer level: Needs assistance Equipment used: Rolling walker (2 wheels) Transfers: Sit to/from Stand Sit to Stand: Supervision (Pt initially leaning laterally to L during STS but able to improve to more midline during STS. Stand>sit with improved eccentric control with min cuing for safe hand placement.)   Step pivot transfers: Supervision, Min guard (stand pivot recliner<>BSC with close supervision<>CGA)       General transfer comment: Pt initially attempts STS from recliner but experiences LOB posteriorly back onto chair with PT assisting in lowering pt safely/slowly. Pt successful with STS on 2nd attempt with decreased L lateral lean as compared to first attempt.    Ambulation/Gait Ambulation/Gait assistance: Min guard, Supervision Gait Distance (Feet): 170 Feet Assistive device: Rolling walker (2 wheels) Gait Pattern/deviations: Decreased step length - right, Decreased dorsiflexion - right, Decreased stride length Gait velocity: decreased     General Gait Details: min education re: need to ambulate within base of AD for upright posture, good foot clearance   Stairs Stairs: Yes Stairs assistance: Min assist Stair Management: One rail Left, Step to pattern Number of Stairs: 4 (+ 2)     Wheelchair Mobility    Modified Rankin (Stroke Patients Only)       Balance  Overall balance assessment: Needs assistance Sitting-balance support: Feet supported, Bilateral upper extremity supported Sitting balance-Leahy Scale: Good     Standing balance support: During functional activity, Bilateral upper  extremity supported Standing balance-Leahy Scale: Fair Standing balance comment: Pt briefly able to demonstrate static standing without BUE support (slight lean on furniture with BLE) with close supervision<>CGA without overt LOB.                            Cognition Arousal/Alertness: Awake/alert Behavior During Therapy: WFL for tasks assessed/performed Overall Cognitive Status: Within Functional Limits for tasks assessed                                 General Comments: pt eager to d/c home        Exercises      General Comments General comments (skin integrity, edema, etc.): Pt left on BSC in care of nurse      Pertinent Vitals/Pain Pain Assessment Pain Assessment: Faces Faces Pain Scale: Hurts a little bit Pain Location: R hip Pain Descriptors / Indicators: Discomfort, Grimacing Pain Intervention(s): Patient requesting pain meds-RN notified, Monitored during session    Home Living                          Prior Function            PT Goals (current goals can now be found in the care plan section) Acute Rehab PT Goals Patient Stated Goal: get better, go home PT Goal Formulation: With patient Time For Goal Achievement: 09/19/21 Potential to Achieve Goals: Good Progress towards PT goals: Progressing toward goals    Frequency    BID      PT Plan Current plan remains appropriate    Co-evaluation              AM-PAC PT "6 Clicks" Mobility   Outcome Measure  Help needed turning from your back to your side while in a flat bed without using bedrails?: None Help needed moving from lying on your back to sitting on the side of a flat bed without using bedrails?: A Little Help needed moving to and from a bed to a chair (including a wheelchair)?: A Little Help needed standing up from a chair using your arms (e.g., wheelchair or bedside chair)?: A Little Help needed to walk in hospital room?: A Little Help needed climbing  3-5 steps with a railing? : A Little 6 Click Score: 19    End of Session Equipment Utilized During Treatment: Gait belt Activity Tolerance: Patient tolerated treatment well Patient left:  (on BSC in care of nurse) Nurse Communication: Patient requests pain meds PT Visit Diagnosis: Unsteadiness on feet (R26.81);Muscle weakness (generalized) (M62.81);Difficulty in walking, not elsewhere classified (R26.2) Pain - Right/Left: Right Pain - part of body: Hip     Time: 1548-1600 PT Time Calculation (min) (ACUTE ONLY): 12 min  Charges:  $Gait Training: 8-22 mins $Therapeutic Activity: 8-22 mins                     Lavone Nian, PT, DPT 09/08/21, 3:10 PM   Waunita Schooner 09/08/2021, 3:07 PM

## 2021-09-08 NOTE — Progress Notes (Signed)
Physical Therapy Treatment Patient Details Name: ERIC MORGANTI MRN: [id number] DOB: [birth year] Today's Date: 09/08/2021   History of Present Illness Pt is an 81 y/o F admitted on 09/04/21 for I&D of R anterior hip & anterior hip revision 2/2 infected total hip. PMH: DM2, HTN, gout, CKD, R hip replacement (2018), lumbar DDD, cataracts, gout    PT Comments    Pt seen for PT tx with pt agreeable, reporting her pain is much better, noting no pain during gait 2/2 being premedicated. Pt is able to complete STS with CGA fade to supervision with improving hand placement and sequencing & stand>sit with improving eccentric control. Pt ambulates room<>gym with RW & CGA fade to supervision with improving ability to maintain proper hand placement & good RLE foot clearance. After PT provides demonstration pt negotiates 4 steps + 2 steps with L rail to simulate home entry with min assist & compensatory pattern. Due to improved progress, PT recommends d/c home with HHPT with pt reporting her niece works in the evenings/nights & can assist during the day when her son is as dialysis appointments. Will continue to follow pt acutely to address RLE strengthening, gait, and stairs.     Recommendations for follow up therapy are one component of a multi-disciplinary discharge planning process, led by the attending physician.  Recommendations may be updated based on patient status, additional functional criteria and insurance authorization.  Follow Up Recommendations  Home health PT Can patient physically be transported by private vehicle: Yes   Assistance Recommended at Discharge Intermittent Supervision/Assistance  Patient can return home with the following A little help with walking and/or transfers;Assist for transportation;Assistance with cooking/housework;Help with stairs or ramp for entrance   Equipment Recommendations  None recommended by PT    Recommendations for Other Services       Precautions /  Restrictions Precautions Precautions: Fall Restrictions Weight Bearing Restrictions: Yes RLE Weight Bearing: Weight bearing as tolerated     Mobility  Bed Mobility               General bed mobility comments: pt received & left up in recliner    Transfers Overall transfer level: Needs assistance Equipment used: Rolling walker (2 wheels) Transfers: Sit to/from Stand Sit to Stand: Supervision (Pt initially leaning laterally to L during STS but able to improve to more midline during STS. Stand>sit with improved eccentric control with min cuing for safe hand placement.)   Step pivot transfers: Supervision, Min guard (stand pivot recliner<>BSC with close supervision<>CGA)            Ambulation/Gait Ambulation/Gait assistance: Min guard, Supervision (CGA fade to supervision) Gait Distance (Feet): 150 Feet (+ 150 ft) Assistive device: Rolling walker (2 wheels) Gait Pattern/deviations: Decreased step length - right, Decreased dorsiflexion - right, Decreased stride length Gait velocity: decreased     General Gait Details: Improving ability to maintain proper hand placement on RW, adequate foot clearance RLE.   Stairs Stairs: Yes Stairs assistance: Min assist Stair Management: One rail Left, Step to pattern Number of Stairs: 4 (+ 2)     Wheelchair Mobility    Modified Rankin (Stroke Patients Only)       Balance Overall balance assessment: Needs assistance Sitting-balance support: Feet supported, Bilateral upper extremity supported Sitting balance-Leahy Scale: Good     Standing balance support: During functional activity, No upper extremity supported Standing balance-Leahy Scale: Fair Standing balance comment: Pt briefly able to demonstrate static standing without BUE support (slight lean on furniture with  BLE) with close supervision<>CGA without overt LOB.                            Cognition Arousal/Alertness: Awake/alert Behavior During  Therapy: WFL for tasks assessed/performed Overall Cognitive Status: Within Functional Limits for tasks assessed                                          Exercises      General Comments General comments (skin integrity, edema, etc.): Pt with continent void on BSC.      Pertinent Vitals/Pain Pain Assessment Pain Assessment: No/denies pain Faces Pain Scale: No hurt    Home Living                          Prior Function            PT Goals (current goals can now be found in the care plan section) Acute Rehab PT Goals Patient Stated Goal: get better, go home PT Goal Formulation: With patient Time For Goal Achievement: 09/19/21 Potential to Achieve Goals: Good Progress towards PT goals: Progressing toward goals    Frequency    BID      PT Plan Discharge plan needs to be updated;Equipment recommendations need to be updated    Co-evaluation              AM-PAC PT "6 Clicks" Mobility   Outcome Measure  Help needed turning from your back to your side while in a flat bed without using bedrails?: None Help needed moving from lying on your back to sitting on the side of a flat bed without using bedrails?: A Little Help needed moving to and from a bed to a chair (including a wheelchair)?: A Little Help needed standing up from a chair using your arms (e.g., wheelchair or bedside chair)?: A Little Help needed to walk in hospital room?: A Little Help needed climbing 3-5 steps with a railing? : A Little 6 Click Score: 19    End of Session Equipment Utilized During Treatment: Gait belt Activity Tolerance: Patient tolerated treatment well Patient left: in chair;with chair alarm set;with call bell/phone within reach Nurse Communication: Mobility status PT Visit Diagnosis: Unsteadiness on feet (R26.81);Muscle weakness (generalized) (M62.81);Difficulty in walking, not elsewhere classified (R26.2)     Time: 6606-3016 PT Time Calculation (min)  (ACUTE ONLY): 35 min  Charges:  $Gait Training: 8-22 mins $Therapeutic Activity: 8-22 mins                     Lavone Nian, PT, DPT 09/08/21, 12:46 PM   Waunita Schooner 09/08/2021, 12:44 PM

## 2021-09-08 NOTE — Progress Notes (Signed)
Met with the patient in the room, She lives at home with her son and her niece helps her regularly She has DME at home including RW and 3 in 1, doe snot need additional [place] is set up for [facility] services Her niece will transport her home She can afford her medication

## 2021-09-08 NOTE — Plan of Care (Signed)
  Problem: Education: Goal: Ability to describe self-care measures that may prevent or decrease complications (Diabetes Survival Skills Education) will improve Outcome: Progressing Goal: Individualized Educational Video(s) Outcome: Progressing   Problem: Coping: Goal: Ability to adjust to condition or change in health will improve Outcome: Progressing   Problem: Fluid Volume: Goal: Ability to maintain a balanced intake and output will improve Outcome: Progressing   Problem: Health Behavior/Discharge Planning: Goal: Ability to identify and utilize available resources and services will improve Outcome: Progressing Goal: Ability to manage health-related needs will improve Outcome: Progressing   Problem: Metabolic: Goal: Ability to maintain appropriate glucose levels will improve Outcome: Progressing   Problem: Nutritional: Goal: Maintenance of adequate nutrition will improve Outcome: Progressing Goal: Progress toward achieving an optimal weight will improve Outcome: Progressing   Problem: Skin Integrity: Goal: Risk for impaired skin integrity will decrease Outcome: Progressing   Problem: Tissue Perfusion: Goal: Adequacy of tissue perfusion will improve Outcome: Progressing   Problem: Education: Goal: Knowledge of the prescribed therapeutic regimen will improve Outcome: Progressing Goal: Understanding of discharge needs will improve Outcome: Progressing Goal: Individualized Educational Video(s) Outcome: Progressing   Problem: Activity: Goal: Ability to avoid complications of mobility impairment will improve Outcome: Progressing Goal: Ability to tolerate increased activity will improve Outcome: Progressing   Problem: Clinical Measurements: Goal: Postoperative complications will be avoided or minimized Outcome: Progressing   Problem: Pain Management: Goal: Pain level will decrease with appropriate interventions Outcome: Progressing   Problem: Skin  Integrity: Goal: Will show signs of wound healing Outcome: Progressing   Problem: Education: Goal: Knowledge of General Education information will improve Description: Including pain rating scale, medication(s)/side effects and non-pharmacologic comfort measures Outcome: Progressing   Problem: Health Behavior/Discharge Planning: Goal: Ability to manage health-related needs will improve Outcome: Progressing   Problem: Clinical Measurements: Goal: Ability to maintain clinical measurements within normal limits will improve Outcome: Progressing Goal: Will remain free from infection Outcome: Progressing Goal: Diagnostic test results will improve Outcome: Progressing Goal: Respiratory complications will improve Outcome: Progressing Goal: Cardiovascular complication will be avoided Outcome: Progressing   Problem: Activity: Goal: Risk for activity intolerance will decrease Outcome: Progressing   Problem: Nutrition: Goal: Adequate nutrition will be maintained Outcome: Progressing   Problem: Coping: Goal: Level of anxiety will decrease Outcome: Progressing   Problem: Elimination: Goal: Will not experience complications related to bowel motility Outcome: Progressing Goal: Will not experience complications related to urinary retention Outcome: Progressing   Problem: Pain Managment: Goal: General experience of comfort will improve Outcome: Progressing   Problem: Safety: Goal: Ability to remain free from injury will improve Outcome: Progressing   Problem: Skin Integrity: Goal: Risk for impaired skin integrity will decrease Outcome: Progressing   

## 2021-09-09 ENCOUNTER — Inpatient Hospital Stay: Payer: Self-pay

## 2021-09-09 LAB — AEROBIC/ANAEROBIC CULTURE W GRAM STAIN (SURGICAL/DEEP WOUND)
Gram Stain: NONE SEEN
Gram Stain: NONE SEEN
Gram Stain: NONE SEEN
Gram Stain: NONE SEEN

## 2021-09-09 LAB — GLUCOSE, CAPILLARY
Glucose-Capillary: 53 mg/dL — ABNORMAL LOW (ref 70–99)
Glucose-Capillary: 75 mg/dL (ref 70–99)
Glucose-Capillary: 227 mg/dL — ABNORMAL HIGH (ref 70–99)

## 2021-09-09 MED ORDER — ENOXAPARIN SODIUM 40 MG/0.4ML IJ SOSY: 40.0000 mg | PREFILLED_SYRINGE | INTRAMUSCULAR | 0 refills | Status: DC

## 2021-09-09 MED ORDER — CHLORHEXIDINE GLUCONATE CLOTH 2 % EX PADS: 6.0000 | MEDICATED_PAD | Freq: Every day | CUTANEOUS | 0 refills | Status: DC

## 2021-09-09 MED ORDER — SODIUM CHLORIDE 0.9% FLUSH: 10.0000 mL | INTRAVENOUS | 0 refills | Status: DC | PRN

## 2021-09-09 MED ORDER — SODIUM CHLORIDE 0.9 % IV SOLN: 2.0000 g | INTRAVENOUS | 0 refills | Status: DC

## 2021-09-09 MED ORDER — DOCUSATE SODIUM 100 MG PO CAPS: 100.0000 mg | ORAL_CAPSULE | Freq: Two times a day (BID) | ORAL | 0 refills | Status: AC

## 2021-09-09 MED ORDER — OXYCODONE HCL 5 MG PO TABS: 5.0000 mg | ORAL_TABLET | ORAL | 0 refills | Status: DC | PRN

## 2021-09-09 MED ORDER — CHLORHEXIDINE GLUCONATE CLOTH 2 % EX PADS
6.0000 | MEDICATED_PAD | Freq: Every day | CUTANEOUS | Status: DC
  Administered 2021-09-09: 6 via TOPICAL

## 2021-09-09 MED ORDER — CEFTRIAXONE IV (FOR PTA / DISCHARGE USE ONLY): 2.0000 g | INTRAVENOUS | 0 refills | Status: AC

## 2021-09-09 MED ORDER — SODIUM CHLORIDE 0.9% FLUSH: 10.0000 mL | Freq: Two times a day (BID) | INTRAVENOUS | 0 refills | Status: DC

## 2021-09-09 MED ORDER — METHOCARBAMOL 500 MG PO TABS: 500.0000 mg | ORAL_TABLET | Freq: Four times a day (QID) | ORAL | 0 refills | Status: DC | PRN

## 2021-09-09 MED ORDER — SODIUM CHLORIDE 0.9% FLUSH: 10.0000 mL | INTRAVENOUS | Status: DC | PRN

## 2021-09-09 MED ORDER — SODIUM CHLORIDE 0.9% FLUSH
10.0000 mL | Freq: Two times a day (BID) | INTRAVENOUS | Status: DC
  Administered 2021-09-09: 10 mL

## 2021-09-09 NOTE — Inpatient Diabetes Management (Signed)
Inpatient Diabetes Program Recommendations  AACE/ADA: New Consensus Statement on Inpatient Glycemic Control   Target Ranges:  Prepandial:   less than 140 mg/dL      Peak postprandial:   less than 180 mg/dL (1-2 hours)      Critically ill patients:  140 - 180 mg/dL    Latest Reference Range & Units 09/09/21 07:36 09/09/21 08:03  Glucose-Capillary 70 - 99 mg/dL 53 (L) 75    Latest Reference Range & Units 09/07/21 07:46 09/07/21 11:26 09/07/21 16:31 09/07/21 21:02 09/08/21 20:52  Glucose-Capillary 70 - 99 mg/dL 93 197 (H) 163 (H) 142 (H) 113 (H)   Review of Glycemic Control  Diabetes history: DM2 Outpatient Diabetes medications: Glipizide XL 10 mg BID, Metformin 500 mg BID, Januvia 50 mg daily Current orders for Inpatient glycemic control: Novolog 0-15 units TID with meals, Glipizide XL 10 mg BID, Tradjenta 5 mg daily  Inpatient Diabetes Program Recommendations:    Oral DM meds: Fasting glucose 53 mg/d/l this morning. While inpatient, please discontinue Glipizide.  Thanks, Barnie Alderman, RN, MSN, [place] Diabetes Coordinator Inpatient Diabetes Program [phone] (Team Pager from 8am to [place])

## 2021-09-09 NOTE — Progress Notes (Signed)
Subjective: 5 Days Post-Op Procedure(s) (LRB): IRRIGATION AND DEBRIDEMENT ANTERIOR HIP (Right) ANTERIOR HIP REVISION (Right) APPLICATION OF WOUND VAC (Right) Patient reports pain as mild.  Patient is well, and has had no acute complaints or problems Low grade temp Denies any CP, SOB, ABD pain. We will continue therapy today.  Plan is to go Home after hospital stay.  Objective: Vital signs in last 24 hours: Temp:  [98 F (36.7 C)-98.8 F (37.1 C)] 98.8 F (37.1 C) (08/29 0800) Pulse Rate:  [60-70] 65 (08/29 0800) Resp:  [16-18] 16 (08/29 0800) BP: (123-149)/(59-68) 137/66 (08/29 0800) SpO2:  [100 %] 100 % (08/29 0800)  Intake/Output from previous day: 08/28 0701 - 08/29 0700 In: 600 [P.O.:600] Out: -  Intake/Output this shift: No intake/output data recorded.  Recent Labs    09/07/21 0554 09/07/21 1007 09/08/21 0327  HGB 7.5* 8.0* 8.0*   Recent Labs    09/07/21 1007 09/08/21 0327  WBC 11.4* 10.0  RBC 2.64* 2.63*  HCT 24.5* 24.5*  PLT 253 296   No results for input(s): "NA", "K", "CL", "CO2", "BUN", "CREATININE", "GLUCOSE", "CALCIUM" in the last 72 hours.  No results for input(s): "LABPT", "INR" in the last 72 hours.  EXAM General - Patient is Alert, Appropriate, and Oriented Abdomen - soft non tender, non distended Extremity - Neurovascular intact Sensation intact distally Intact pulses distally Dorsiflexion/Plantar flexion intact Dressing - dressing C/D/I and no drainage, Praveena intact with scant drainage Motor Function - intact, moving foot and toes well on exam.   Past Medical History:  Diagnosis Date   Anemia    Anemia, unspecified    Hgb 11.8 - 04/2013   Arthritis    Cataract    Bilateral   CHF (congestive heart failure) (HCC)    Chronic kidney disease    COPD (chronic obstructive pulmonary disease) (HCC)    DDD (degenerative disc disease), lumbar    Degenerative arthritis    Diabetes mellitus type 2, uncomplicated (HCC)    AODM (A1C  6.9%) 04/2013   Diabetes mellitus without complication (HCC)    Dyspnea    with exertion   Esophagitis    Gout    Uric acid 5.4 - 08/2012   Hypertension    Obesity, unspecified    Sarcoidosis, lung ([place])    reported by pt, Clinically without a biopsy   Sleep apnea    OSA--Use C-PAP    Assessment/Plan:   5 Days Post-Op Procedure(s) (LRB): IRRIGATION AND DEBRIDEMENT ANTERIOR HIP (Right) ANTERIOR HIP REVISION (Right) APPLICATION OF WOUND VAC (Right) Principal Problem:   Infection of prosthetic total hip joint, initial encounter ([place])  Estimated body mass index is 28.46 kg/m as calculated from the following:   Height as of this encounter: 5\' 4"  (1.626 m).   Weight as of this encounter: 75.2 kg. Advance diet Up with therapy Pain well controlled  Vital signs are stable.  Acute postop blood loss anemia with underlying chronic anemia -hemoglobin 8.0, trending up.  continue with iron supplement.    Appreciate infectious disease input.  Superficial cultures growing actinomyces.  Cultures from joint negative.  Plan is for 4 weeks of IV ceftriaxone.  Orders for PICC line have been placed today.  Care management to assist with discharge to home with home health PT and nursing.  Plan for discharge to home today pain in PICC line placement  DVT Prophylaxis - Lovenox, TED hose, and SCDs Weight-Bearing as tolerated to right leg   T. Rachelle Hora, PA-C [facility] 09/09/2021, 8:53 AM

## 2021-09-09 NOTE — Discharge Instructions (Signed)
ANTERIOR APPROACH TOTAL HIP INCISION AND DRAINAGE POSTOPERATIVE DIRECTIONS   Hip Rehabilitation, Guidelines Following Surgery  The results of a hip operation are greatly improved after range of motion and muscle strengthening exercises. Follow all safety measures which are given to protect your hip. If any of these exercises cause increased pain or swelling in your joint, decrease the amount until you are comfortable again. Then slowly increase the exercises. Call your caregiver if you have problems or questions.   HOME CARE INSTRUCTIONS  Remove items at home which could result in a fall. This includes throw rugs or furniture in walking pathways.  ICE to the affected hip every three hours for 30 minutes at a time and then as needed for pain and swelling.  Continue to use ice on the hip for pain and swelling from surgery. You may notice swelling that will progress down to the foot and ankle.  This is normal after surgery.  Elevate the leg when you are not up walking on it.   Continue to use the breathing machine which will help keep your temperature down.  It is common for your temperature to cycle up and down following surgery, especially at night when you are not up moving around and exerting yourself.  The breathing machine keeps your lungs expanded and your temperature down. Do not place pillow under knee, focus on keeping the knee straight while resting  DIET You may resume your previous home diet once your are discharged from the hospital.  DRESSING / WOUND CARE / SHOWERING Please remove provena negative pressure dressing on 09/16/2021 and apply honey comb dressing. Keep dressing clean and dry at all times.   ACTIVITY Walk with your walker as instructed. Use walker as long as suggested by your caregivers. Avoid periods of inactivity such as sitting longer than an hour when not asleep. This helps prevent blood clots.  You may resume a sexual relationship in one month or when given the OK by  your doctor.  You may return to work once you are cleared by your doctor.  Do not drive a car for 6 weeks or until released by you surgeon.  Do not drive while taking narcotics.  WEIGHT BEARING Weight bearing as tolerated. Use walker/cane as needed for at least 4 weeks post op.  POSTOPERATIVE CONSTIPATION PROTOCOL Constipation - defined medically as fewer than three stools per week and severe constipation as less than one stool per week.  One of the most common issues patients have following surgery is constipation.  Even if you have a regular bowel pattern at home, your normal regimen is likely to be disrupted due to multiple reasons following surgery.  Combination of anesthesia, postoperative narcotics, change in appetite and fluid intake all can affect your bowels.  In order to avoid complications following surgery, here are some recommendations in order to help you during your recovery period.  Colace (docusate) - Pick up an over-the-counter form of Colace or another stool softener and take twice a day as long as you are requiring postoperative pain medications.  Take with a full glass of water daily.  If you experience loose stools or diarrhea, hold the colace until you stool forms back up.  If your symptoms do not get better within 1 week or if they get worse, check with your doctor.  Dulcolax (bisacodyl) - Pick up over-the-counter and take as directed by the product packaging as needed to assist with the movement of your bowels.  Take with a full  glass of water.  Use this product as needed if not relieved by Colace only.   MiraLax (polyethylene glycol) - Pick up over-the-counter to have on hand.  MiraLax is a solution that will increase the amount of water in your bowels to assist with bowel movements.  Take as directed and can mix with a glass of water, juice, soda, coffee, or tea.  Take if you go more than two days without a movement. Do not use MiraLax more than once per day. Call your  doctor if you are still constipated or irregular after using this medication for 7 days in a row.  If you continue to have problems with postoperative constipation, please contact the office for further assistance and recommendations.  If you experience "the worst abdominal pain ever" or develop nausea or vomiting, please contact the office immediatly for further recommendations for treatment.  ITCHING  If you experience itching with your medications, try taking only a single pain pill, or even half a pain pill at a time.  You can also use Benadryl over the counter for itching or also to help with sleep.   TED HOSE STOCKINGS Wear the elastic stockings on both legs for six weeks following surgery during the day but you may remove then at night for sleeping.  MEDICATIONS See your medication summary on the "After Visit Summary" that the nursing staff will review with you prior to discharge.  You may have some home medications which will be placed on hold until you complete the course of blood thinner medication.  It is important for you to complete the blood thinner medication as prescribed by your surgeon.  Continue your approved medications as instructed at time of discharge.  PRECAUTIONS If you experience chest pain or shortness of breath - call 911 immediately for transfer to the hospital emergency department.  If you develop a fever greater that 101 F, purulent drainage from wound, increased redness or drainage from wound, foul odor from the wound/dressing, or calf pain - CONTACT YOUR SURGEON.                                                   FOLLOW-UP APPOINTMENTS Make sure you keep all of your appointments after your operation with your surgeon and caregivers. You should call the office at the above phone number and make an appointment for approximately two weeks after the date of your surgery or on the date instructed by your surgeon outlined in the "After Visit Summary".  RANGE OF MOTION  AND STRENGTHENING EXERCISES  These exercises are designed to help you keep full movement of your hip joint. Follow your caregiver's or physical therapist's instructions. Perform all exercises about fifteen times, three times per day or as directed. Exercise both hips, even if you have had only one joint replacement. These exercises can be done on a training (exercise) mat, on the floor, on a table or on a bed. Use whatever works the best and is most comfortable for you. Use music or television while you are exercising so that the exercises are a pleasant break in your day. This will make your life better with the exercises acting as a break in routine you can look forward to.  Lying on your back, slowly slide your foot toward your buttocks, raising your knee up off the floor. Then slowly  slide your foot back down until your leg is straight again.  Lying on your back spread your legs as far apart as you can without causing discomfort.  Lying on your side, raise your upper leg and foot straight up from the floor as far as is comfortable. Slowly lower the leg and repeat.  Lying on your back, tighten up the muscle in the front of your thigh (quadriceps muscles). You can do this by keeping your leg straight and trying to raise your heel off the floor. This helps strengthen the largest muscle supporting your knee.  Lying on your back, tighten up the muscles of your buttocks both with the legs straight and with the knee bent at a comfortable angle while keeping your heel on the floor.   IF YOU ARE TRANSFERRED TO A SKILLED REHAB FACILITY If the patient is transferred to a skilled rehab facility following release from the hospital, a list of the current medications will be sent to the facility for the patient to continue.  When discharged from the skilled rehab facility, please have the facility set up the patient's [facility] prior to being released. Also, the skilled facility will be responsible  for providing the patient with their medications at time of release from the facility to include their pain medication, the muscle relaxants, and their blood thinner medication. If the patient is still at the rehab facility at time of the two week follow up appointment, the skilled rehab facility will also need to assist the patient in arranging follow up appointment in our office and any transportation needs.  MAKE SURE YOU:  Understand these instructions.  Get help right away if you are not doing well or get worse.    Pick up stool softner and laxative for home use following surgery while on pain medications. Continue to use ice for pain and swelling after surgery. Do not use any lotions or creams on the incision until instructed by your surgeon.

## 2021-09-09 NOTE — Progress Notes (Signed)
Peripherally Inserted Central Catheter Placement  The IV Nurse has discussed with the patient and/or persons authorized to consent for the patient, the purpose of this procedure and the potential benefits and risks involved with this procedure.  The benefits include less needle sticks, lab draws from the catheter, and the patient may be discharged home with the catheter. Risks include, but not limited to, infection, bleeding, blood clot (thrombus formation), and puncture of an artery; nerve damage and irregular heartbeat and possibility to perform a PICC exchange if needed/ordered by physician.  Alternatives to this procedure were also discussed.  Bard Power PICC patient education guide, fact sheet on infection prevention and patient information card has been provided to patient /or left at bedside.    PICC Placement Documentation  PICC Single Lumen 09/09/21 Right Brachial 39 cm 1 cm (Active)  Indication for Insertion or Continuance of Line Home intravenous therapies (PICC only) 09/09/21 1120  Exposed Catheter (cm) 1 cm 09/09/21 1120  Site Assessment Clean, Dry, Intact 09/09/21 1120  Line Status Flushed;Saline locked;Blood return noted 09/09/21 1120  Dressing Type Transparent;Securing device 09/09/21 1120  Dressing Status Antimicrobial disc in place 09/09/21 1120  Dressing Intervention New dressing;Other (Comment) 09/09/21 1120  Dressing Change Due 09/16/21 09/09/21 1120       Christella Noa Albarece 09/09/2021, 11:21 AM

## 2021-09-09 NOTE — Progress Notes (Signed)
Physical Therapy Treatment Patient Details Name: Mackenzie Hill MRN: [id number] DOB: 1940/12/04 Today's Date: 09/09/2021   History of Present Illness Pt is an 81 y/o F admitted on 09/04/21 for I&D of R anterior hip & anterior hip revision 2/2 infected total hip. PMH: DM2, HTN, gout, CKD, R hip replacement (2018), lumbar DDD, cataracts, gout    PT Comments    Pt was sitting in recliner finishing breakfast upon arriving. She is A and O x 4. Endorsing 4/10 pain. Requested to use BSC . She was safely able to stand and ambulate to [facility]. Successful urination noted. She requires supervision only to stand, ambulate 200 ft with RW, and to return to sitting in recliner. RN in room post session. Pt did not feel like performing stairs again this morning but was able to correctly state proper sequencing to simulate home entry. Recommend DC home with HHPT to follow. Will benefit from continued skilled PT at DC to address deficits while maximizing independence with ADLs.    Recommendations for follow up therapy are one component of a multi-disciplinary discharge planning process, led by the attending physician.  Recommendations may be updated based on patient status, additional functional criteria and insurance authorization.  Follow Up Recommendations  Home health PT     Assistance Recommended at Discharge Intermittent Supervision/Assistance  Patient can return home with the following A little help with walking and/or transfers;Assist for transportation;Assistance with cooking/housework;Help with stairs or ramp for entrance;A little help with bathing/dressing/bathroom   Equipment Recommendations  None recommended by PT       Precautions / Restrictions Precautions Precautions: Fall Restrictions Weight Bearing Restrictions: Yes RLE Weight Bearing: Weight bearing as tolerated     Mobility  Bed Mobility    General bed mobility comments: In recliner pre/post session    Transfers Overall transfer  level: Needs assistance Equipment used: Rolling walker (2 wheels) Transfers: Sit to/from Stand Sit to Stand: Supervision    General transfer comment: no physical assistance required to stand or sit. Pain still making pt lean towards L with sit>stand. Good eccentric control today with stand >sit    Ambulation/Gait Ambulation/Gait assistance: Supervision Gait Distance (Feet): 200 Feet Assistive device: Rolling walker (2 wheels) Gait Pattern/deviations: Trunk flexed Gait velocity: decreased     General Gait Details: pt was able to safely ambulate 200 ft with RW . Vcs throughout for posture correction. antalgic gait overall but tolerated well.   Stairs     Stair Management:  (pt refused stair training this AM but feel she did well with stairs previous date. She was able to correctly state proper technique/sequencing for stair performance.)         Balance Overall balance assessment: Needs assistance Sitting-balance support: Feet supported, Bilateral upper extremity supported Sitting balance-Leahy Scale: Good     Standing balance support: Reliant on assistive device for balance, During functional activity, Bilateral upper extremity supported Standing balance-Leahy Scale: Fair Standing balance comment: reliant on RW during dynamic standing activity         Cognition Arousal/Alertness: Awake/alert Behavior During Therapy: WFL for tasks assessed/performed Overall Cognitive Status: Within Functional Limits for tasks assessed      General Comments: Pt is A and O x 4               Pertinent Vitals/Pain Pain Assessment Pain Assessment: 0-10 Pain Score: 4  Pain Location: R hip Pain Descriptors / Indicators: Discomfort, Grimacing Pain Intervention(s): Limited activity within patient's tolerance, Monitored during session, Repositioned, Ice applied  PT Goals (current goals can now be found in the care plan section) Acute Rehab PT Goals Patient Stated Goal: get  better, go home Progress towards PT goals: Progressing toward goals    Frequency    BID      PT Plan Current plan remains appropriate       AM-PAC PT "6 Clicks" Mobility   Outcome Measure  Help needed turning from your back to your side while in a flat bed without using bedrails?: None Help needed moving from lying on your back to sitting on the side of a flat bed without using bedrails?: A Little Help needed moving to and from a bed to a chair (including a wheelchair)?: A Little Help needed standing up from a chair using your arms (e.g., wheelchair or bedside chair)?: A Little Help needed to walk in hospital room?: A Little Help needed climbing 3-5 steps with a railing? : A Little 6 Click Score: 19    End of Session   Activity Tolerance: Patient tolerated treatment well Patient left: in chair;with chair alarm set;with call bell/phone within reach;with nursing/sitter in room Nurse Communication: Mobility status PT Visit Diagnosis: Unsteadiness on feet (R26.81);Muscle weakness (generalized) (M62.81);Difficulty in walking, not elsewhere classified (R26.2) Pain - Right/Left: Right Pain - part of body: Hip     Time: 9476-5465 PT Time Calculation (min) (ACUTE ONLY): 26 min  Charges:  $Gait Training: 8-22 mins $Therapeutic Activity: 8-22 mins                     Julaine Fusi PTA 09/09/21, 9:25 AM

## 2021-09-09 NOTE — Progress Notes (Addendum)
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 To discharging provider:  please sign these orders via discharge navigator,  Select New Orders & click on the button choice - Manage This Unsigned Work.     Thank you for allowing pharmacy to be a part of this patient's care.  Doreene Eland, PharmD, BCPS, BCIDP Work Cell: [phone] 09/09/2021 9:05 AM

## 2021-09-09 NOTE — Plan of Care (Signed)
  Problem: Education: Goal: Ability to describe self-care measures that may prevent or decrease complications (Diabetes Survival Skills Education) will improve Outcome: Progressing Goal: Individualized Educational Video(s) Outcome: Progressing   Problem: Coping: Goal: Ability to adjust to condition or change in health will improve Outcome: Progressing   Problem: Fluid Volume: Goal: Ability to maintain a balanced intake and output will improve Outcome: Progressing   Problem: Health Behavior/Discharge Planning: Goal: Ability to identify and utilize available resources and services will improve Outcome: Progressing Goal: Ability to manage health-related needs will improve Outcome: Progressing   Problem: Metabolic: Goal: Ability to maintain appropriate glucose levels will improve Outcome: Progressing   Problem: Nutritional: Goal: Maintenance of adequate nutrition will improve Outcome: Progressing Goal: Progress toward achieving an optimal weight will improve Outcome: Progressing   Problem: Skin Integrity: Goal: Risk for impaired skin integrity will decrease Outcome: Progressing   Problem: Tissue Perfusion: Goal: Adequacy of tissue perfusion will improve Outcome: Progressing   Problem: Education: Goal: Knowledge of the prescribed therapeutic regimen will improve Outcome: Progressing Goal: Understanding of discharge needs will improve Outcome: Progressing Goal: Individualized Educational Video(s) Outcome: Progressing   Problem: Activity: Goal: Ability to avoid complications of mobility impairment will improve Outcome: Progressing Goal: Ability to tolerate increased activity will improve Outcome: Progressing   Problem: Clinical Measurements: Goal: Postoperative complications will be avoided or minimized Outcome: Progressing   Problem: Pain Management: Goal: Pain level will decrease with appropriate interventions Outcome: Progressing   Problem: Skin  Integrity: Goal: Will show signs of wound healing Outcome: Progressing   Problem: Education: Goal: Knowledge of General Education information will improve Description: Including pain rating scale, medication(s)/side effects and non-pharmacologic comfort measures Outcome: Progressing   Problem: Health Behavior/Discharge Planning: Goal: Ability to manage health-related needs will improve Outcome: Progressing   Problem: Clinical Measurements: Goal: Ability to maintain clinical measurements within normal limits will improve Outcome: Progressing Goal: Will remain free from infection Outcome: Progressing Goal: Diagnostic test results will improve Outcome: Progressing Goal: Respiratory complications will improve Outcome: Progressing Goal: Cardiovascular complication will be avoided Outcome: Progressing   Problem: Activity: Goal: Risk for activity intolerance will decrease Outcome: Progressing   Problem: Nutrition: Goal: Adequate nutrition will be maintained Outcome: Progressing   Problem: Coping: Goal: Level of anxiety will decrease Outcome: Progressing   Problem: Elimination: Goal: Will not experience complications related to bowel motility Outcome: Progressing Goal: Will not experience complications related to urinary retention Outcome: Progressing   Problem: Pain Managment: Goal: General experience of comfort will improve Outcome: Progressing   Problem: Safety: Goal: Ability to remain free from injury will improve Outcome: Progressing   Problem: Skin Integrity: Goal: Risk for impaired skin integrity will decrease Outcome: Progressing   

## 2021-09-09 NOTE — Progress Notes (Signed)
Reviewed discharge instructions with Niece, Elmo Putt. Pt discharged with CPAP from home and all other belongings. Portable prevena in placed. Staff wheeled pt out. Pt transported to home via private vehicle.

## 2021-09-09 NOTE — TOC Progression Note (Signed)
Transition of Care [facility]) - Progression Note    Patient Details  Name: ADDASYN MCBREEN MRN: [id number] Date of Birth: 09-Apr-1940  Transition of Care [facility]) CM/SW [place], RN Phone Number: 09/09/2021, 9:27 AM  Clinical Narrative:    Damaris Schooner with Elmo Putt the patient's niece, she is aware that the patient will go home on IV ABX, she will be providing transportation for the paient to go home, the patient has DME at home already, she will be seen by [facility] for [facility] services including PICC Line care and labs.     Expected Discharge Plan: [place] Barriers to Discharge: Barriers Resolved  Expected Discharge Plan and Services Expected Discharge Plan: [place]   Discharge Planning Services: CM Consult   Living arrangements for the past 2 months: Single Family Home Expected Discharge Date: 09/09/21               DME Arranged: N/A DME Agency: NA       HH Arranged: PT, RN [place] Agency: [place] Date [place]: 09/08/21 Time [place]: [id number] Representative spoke with at [place]: [place] Determinants of Health ([place]) Interventions    Readmission Risk Interventions     No data to display

## 2021-09-09 NOTE — Discharge Summary (Cosign Needed Addendum)
Physician Discharge Summary  Patient ID: Mackenzie Hill MRN: [id number] DOB/AGE: 06-13-1940 81 y.o.  Admit date: 09/04/2021 Discharge date: 09/09/2021  Admission Diagnoses:  Infection of prosthetic total hip joint, initial encounter [facility]) [T84.59XA, [place]   Discharge Diagnoses: Patient Active Problem List   Diagnosis Date Noted   Infection of prosthetic total hip joint, initial encounter ([place]) 09/04/2021   Anemia 10/17/2020   Degenerative arthritis 10/17/2020   Esophagitis 10/17/2020   Gout 10/17/2020   Obesity 10/17/2020   Pain due to onychomycosis of toenails of both feet 10/17/2020   Benign hypertensive kidney disease with chronic kidney disease 03/14/2019   Chronic kidney disease, stage IV (severe) ([place]) 03/14/2019   Proteinuria 03/14/2019   Secondary hyperparathyroidism of renal origin ([place]) 03/14/2019   COPD (chronic obstructive pulmonary disease) ([place]) 08/11/2016   Primary localized osteoarthritis of right hip 07/21/2016   Colon cancer screening 12/17/2015   Chronic gouty arthropathy without tophi 04/10/2015   Arthralgia of multiple sites 02/27/2015   Great toe pain, left 02/27/2015   OSA on CPAP 52/48/1859   Diastolic dysfunction 09/31/1216   DOE (dyspnea on exertion) 02/20/2014   Diabetes ([place]) 06/19/2013   Essential (primary) hypertension 06/19/2013    Past Medical History:  Diagnosis Date   Anemia    Anemia, unspecified    Hgb 11.8 - 04/2013   Arthritis    Cataract    Bilateral   CHF (congestive heart failure) (HCC)    Chronic kidney disease    COPD (chronic obstructive pulmonary disease) ([place])    DDD (degenerative disc disease), lumbar    Degenerative arthritis    Diabetes mellitus type 2, uncomplicated ([place])    AODM (A1C 6.9%) 04/2013   Diabetes mellitus without complication (HCC)    Dyspnea    with exertion   Esophagitis    Gout    Uric acid 5.4 - 08/2012   Hypertension    Obesity, unspecified    Sarcoidosis, lung ([place])    reported by pt,  Clinically without a biopsy   Sleep apnea    OSA--Use C-PAP     Transfusion: none   Consultants (if any):   Discharged Condition: Improved  Hospital Course: Mackenzie Hill is an 81 y.o. female who was admitted 09/04/2021 with a diagnosis of Infection of prosthetic total hip joint, initial encounter [facility]) and went to the operating room on 09/04/2021 and underwent the above named procedures.    Surgeries: Procedure(s): IRRIGATION AND DEBRIDEMENT ANTERIOR HIP ANTERIOR HIP REVISION APPLICATION OF WOUND VAC on 09/04/2021 Patient tolerated the surgery well. Taken to PACU where she was stabilized and then transferred to the orthopedic floor.  Started on Lovenox 30 mg q 12 hrs. Foot pumps applied bilaterally at 80 mm. Heels elevated on bed with rolled towels. No evidence of DVT. Negative [place]. Physical therapy started on day #1 for gait training and transfer. OT started day #1 for ADL and assisted devices.  Patient's foley was d/c on day #1.  On postop day 2 3 and 4 cultures pending.  No growth.  She continued with IV antibiotics and made slow progress of physical therapy.  On postop day 4 patient made significant progress of physical therapy and disposition was changed from skilled nursing facility to home with home health.  Cultures from the joint have been negative but the superficial cultures did show actinomyces growth.  IV antibiotics were changed from cefazolin to ceftriaxone and IDs recommendations were for 4 to 6 weeks of IV antibiotics.  A PICC line  was placed on postop day 5  On post op day #5 patient was stable and ready for discharge to home with home health PT and nursing.   She was given perioperative antibiotics:  Anti-infectives (From admission, onward)    Start     Dose/Rate Route Frequency Ordered Stop   09/10/21 0000  cefTRIAXone (ROCEPHIN) IVPB        2 g Intravenous Every 24 hours 09/09/21 0907 10/02/21 2359   09/09/21 0000  cefTRIAXone 2 g in sodium chloride 0.9 % 100 mL   Status:  Discontinued        2 g Intravenous Every 24 hours 09/09/21 0852 09/09/21    09/08/21 1700  cefTRIAXone (ROCEPHIN) 2 g in sodium chloride 0.9 % 100 mL IVPB        2 g 200 mL/hr over 30 Minutes Intravenous Every 24 hours 09/08/21 1600     09/04/21 2200  ceFAZolin (ANCEF) IVPB 2g/100 mL premix  Status:  Discontinued        2 g 200 mL/hr over 30 Minutes Intravenous Every 8 hours 09/04/21 1621 09/04/21 1623   09/04/21 2200  ceFAZolin (ANCEF) IVPB 2g/100 mL premix  Status:  Discontinued        2 g 200 mL/hr over 30 Minutes Intravenous Every 12 hours 09/04/21 1623 09/08/21 1600   09/04/21 1800  ceFAZolin (ANCEF) IVPB 2g/100 mL premix  Status:  Discontinued        2 g 200 mL/hr over 30 Minutes Intravenous Every 6 hours 09/04/21 1710 09/04/21 1749   09/04/21 1459  vancomycin (VANCOCIN) powder  Status:  Discontinued          As needed 09/04/21 1500 09/04/21 1547     .  She was given sequential compression devices, early ambulation, and Lovenox, teds for DVT prophylaxis.  She benefited maximally from the hospital stay and there were no complications.    Recent vital signs:  Vitals:   09/09/21 0800 09/09/21 1200  BP: 137/66 120/65  Pulse: 65 73  Resp: 16 20  Temp: 98.8 F (37.1 C) 98.7 F (37.1 C)  SpO2: 100% 100%    Recent laboratory studies:  Lab Results  Component Value Date   HGB 8.0 (L) 09/08/2021   HGB 8.0 (L) 09/07/2021   HGB 7.5 (L) 09/07/2021   Lab Results  Component Value Date   WBC 10.0 09/08/2021   PLT 296 09/08/2021   Lab Results  Component Value Date   INR 0.99 07/07/2016   Lab Results  Component Value Date   NA 138 09/05/2021   K 4.6 09/05/2021   CL 113 (H) 09/05/2021   CO2 21 (L) 09/05/2021   BUN 30 (H) 09/05/2021   CREATININE 1.73 (H) 09/05/2021   GLUCOSE 168 (H) 09/05/2021    Discharge Medications:   Allergies as of 09/09/2021       Reactions   Hydralazine Itching   Meloxicam Other (See Comments)   Causes excessive  sleepiness fainting   Penicillins Hives, Itching, Other (See Comments)   Whelps Has patient had a PCN reaction causing immediate rash, facial/tongue/throat swelling, SOB or lightheadedness with hypotension: Yes Has patient had a PCN reaction causing severe rash involving mucus membranes or skin necrosis: No Has patient had a PCN reaction that required hospitalization: No Has patient had a PCN reaction occurring within the last 10 years: No If all of the above answers are "NO", then may proceed with Cephalosporin use.   Pioglitazone Itching   Tramadol Other (See Comments)  Causes excessive sleepiness Dizzy/fainting        Medication List     STOP taking these medications    brimonidine 0.15 % ophthalmic solution Commonly known as: ALPHAGAN   carvedilol 12.5 MG tablet Commonly known as: COREG   metFORMIN 1000 MG tablet Commonly known as: GLUCOPHAGE   metFORMIN 500 MG tablet Commonly known as: GLUCOPHAGE       TAKE these medications    albuterol 108 (90 Base) MCG/ACT inhaler Commonly known as: VENTOLIN HFA Inhale 2 puffs into the lungs every 6 (six) hours as needed for wheezing or shortness of breath.   allopurinol 100 MG tablet Commonly known as: ZYLOPRIM Take by mouth.   amLODipine 5 MG tablet Commonly known as: NORVASC Take 5 mg by mouth daily.   ascorbic acid 500 MG tablet Commonly known as: VITAMIN C Take 500 mg by mouth daily.   aspirin EC 81 MG tablet Take by mouth.   budesonide-formoterol 160-4.5 MCG/ACT inhaler Commonly known as: SYMBICORT Inhale 2 puffs into the lungs 2 (two) times daily.   Calcium 600/Vitamin D 600-400 MG-UNIT Tabs Generic drug: Calcium Carbonate-Vitamin D3 Take 2 tablets by mouth daily.   cefTRIAXone  IVPB Commonly known as: ROCEPHIN Inject 2 g into the vein daily for 22 days. 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 Start taking on: September 10, 2021   cetirizine 10 MG tablet Commonly known as: ZYRTEC Take 10 mg by mouth at bedtime.   Chlorhexidine Gluconate Cloth 2 % Pads Apply 6 each topically daily. Start taking on: September 10, 2021   cyanocobalamin 1000 MCG tablet Commonly known as: VITAMIN B12 Take 1,000 mcg by mouth daily.   diclofenac sodium 1 % Gel Commonly known as: VOLTAREN Apply topically 2 (two) times daily as needed (pain).   diclofenac Sodium 1 % Gel Commonly known as: VOLTAREN Apply 2 g topically 4 (four) times daily.   docusate sodium 100 MG capsule Commonly known as: COLACE Take 1 capsule (100 mg total) by mouth 2 (two) times daily.   enoxaparin 40 MG/0.4ML injection Commonly known as: LOVENOX Inject 0.4 mLs (40 mg total) into the skin daily for 14 days.   fexofenadine 180 MG tablet Commonly known as: ALLEGRA Take 180 mg by mouth daily.   glipiZIDE 10 MG 24 hr tablet Commonly known as: GLUCOTROL XL Take 10 mg by mouth 2 (two) times daily.   ipratropium-albuterol 0.5-2.5 (3) MG/3ML Soln Commonly known as: DUONEB Take 3 mLs by nebulization every 6 (six) hours as needed (shortness of breath or wheezing).   isosorbide mononitrate 60 MG 24 hr tablet Commonly known as: IMDUR Take 180 mg by mouth daily.   levocetirizine 5 MG tablet Commonly known as: XYZAL   losartan 50 MG tablet Commonly known as: COZAAR Take 100 mg by mouth daily.   magnesium hydroxide 400 MG/5ML suspension Commonly known as: MILK OF MAGNESIA Take 30 mLs by mouth every 4 (four) hours as needed. If constipation/ no BM for 2 days    methocarbamol 500 MG tablet Commonly known as: ROBAXIN Take 1  tablet (500 mg total) by mouth every 6 (six) hours as needed for muscle spasms.   mometasone 50 MCG/ACT nasal spray Commonly known as: NASONEX Place 2 sprays into the nose daily as needed (allergies).   montelukast 10 MG tablet Commonly known as: SINGULAIR Take 10 mg by mouth every evening.   omeprazole 20 MG capsule Commonly known as: PRILOSEC   oxybutynin 5 MG tablet Commonly known as: DITROPAN   oxyCODONE 5 MG immediate release tablet Commonly known as: Oxy IR/ROXICODONE Take 1-2 tablets (5-10 mg total) by mouth every 4 (four) hours as needed for moderate pain (pain score 4-6). What changed:  when to take this reasons to take this   sitaGLIPtin 50 MG tablet Commonly known as: JANUVIA Take 50 mg by mouth daily.   sodium chloride flush 0.9 % Soln Commonly known as: NS 10-40 mLs by Intracatheter route every 12 (twelve) hours.   sodium chloride flush 0.9 % Soln Commonly known as: NS 10-40 mLs by Intracatheter route as needed (flush).   spironolactone 25 MG tablet Commonly known as: ALDACTONE Take 25 mg by mouth daily.   tiotropium 18 MCG inhalation capsule Commonly known as: SPIRIVA Place 18 mcg into inhaler and inhale daily.   Uloric 40 MG tablet Generic drug: febuxostat Take 40 mg by mouth daily.               Home Infusion Instuctions  (From admission, onward)           Start     Ordered   09/09/21 0000  Home infusion instructions       Question:  Instructions  Answer:  Flushing of vascular access device: 0.9% NaCl pre/post medication administration and prn patency; Heparin 100 u/ml, 69m for implanted ports and Heparin 10u/ml, 553mfor all other central venous catheters.   09/09/21 [id number]            Durable Medical Equipment  (From admission, onward)           Start     Ordered   09/04/21 1710  DME Walker rolling  Once       Question Answer Comment  Walker: With 5 [place]   Patient needs a walker to treat with the following condition Infection of prosthetic total hip joint, initial encounter ([facility]     09/04/21 1710   09/04/21 1710  DME 3 n 1  Once        09/04/21 1710   09/04/21 1710  DME Bedside commode  Once       Question:  Patient needs a bedside commode to treat with the following condition  Answer:  Infection of prosthetic total hip joint, initial encounter ([facility]  09/04/21 1710              Discharge Care Instructions  (From admission, onward)           Start     Ordered   09/09/21 0000  Change dressing on IV access line weekly and PRN  (Home infusion instructions - [facility] )        09/09/21 0852            Diagnostic Studies: [place] SITE RITE  Result Date: 09/09/2021 If Site Rite image not attached, placement could not be confirmed due to current cardiac rhythm.  DG Hip Port Unilat With Pelvis 1V Right  Result Date: 09/04/2021 CLINICAL DATA:  Right total hip revision. EXAM: DG HIP (WITH OR  WITHOUT PELVIS) 1V PORT RIGHT COMPARISON:  None Available. FINDINGS: Right total hip arthroplasty is in place. The prosthesis appears located. Gas is present in the joint. A drain is in place. Skin staples are in place. IMPRESSION: Right total hip arthroplasty without radiographic evidence for complication. Electronically Signed   By: San Morelle M.D.   On: 09/04/2021 16:46   MR HIP RIGHT WO CONTRAST  Result Date: 08/20/2021 CLINICAL DATA:  Soft tissue infection suspected, hip, xray done Possible peri-prosthetic fluid collection EXAM: MR OF THE RIGHT HIP WITHOUT CONTRAST TECHNIQUE: Multiplanar, multisequence MR imaging was performed using metal artifact reduction protocol. No intravenous contrast was administered. COMPARISON:  None Available. FINDINGS: Bones/Joint/Cartilage Postsurgical changes from right total hip arthroplasty. Susceptibility artifact from metallic hardware degrades evaluation of the adjacent  structures despite the utilization of metal artifact reduction sequences. Within this limitation, there is no evidence of periprosthetic fracture. No bone marrow edema is identified. Bony pelvis intact without evidence of fracture or diastasis. Degenerative changes of the bilateral SI joints and pubic symphysis. Mild osteoarthritis of the left hip joint. No significant left hip joint effusion. Degenerative disc disease and facet arthropathy of the imaged lower lumbar spine. Ligaments Suboptimally assessed. Muscles and Tendons No acute musculotendinous findings. Soft tissues Well-defined periprosthetic fluid collection along the lateral margin of the right femoral neck extends through the anterior compartment musculature of the proximal right thigh along its the expected site of prior surgical incision. Collection extends into the overlying subcutaneous soft tissues at the anterolateral aspect of the thigh. Although difficult to measure given the elongated, slightly serpiginous course of the collection, the collection measures approximately 11 cm in length. There is no obvious connection to the overlying skin surface. There is soft tissue edema within the anterolateral aspect of the proximal right thigh. No right inguinal lymphadenopathy. Diverticular changes of the sigmoid colon. IMPRESSION: 1. Well-defined periprosthetic fluid collection along the lateral margin of the right femoral neck extends through the anterior compartment musculature of the proximal right thigh into the overlying subcutaneous tissues. Collection measures approximately 11 cm in length. No obvious connection to the overlying skin surface. Findings may represent a postoperative seroma or hematoma. Infected fluid collection would be difficult to exclude by imaging alone. 2. No MRI evidence of osteomyelitis. No findings to suggest a periprosthetic fracture. Electronically Signed   By: Davina Poke D.O.   On: 08/20/2021 10:29    Disposition:  Discharge disposition: 06-Home-Health Care Svc       Discharge Instructions     [facility] pharmacist to adjust dose for Vancomycin, Aminoglycosides and other anti-infective therapies as requested by physician.   Complete by: As directed    [facility] to provide Cath Flo 96m   Complete by: As directed    Administer for PICC line occlusion and as ordered by physician for other access device issues.   Anaphylaxis Kit: Provided to treat any anaphylactic reaction to the medication being provided to the patient if First Dose or when requested by physician   Complete by: As directed    Epinephrine 151mml vial / amp: Administer 0.56m38m0.56ml38mubcutaneously once for moderate to severe anaphylaxis, nurse to call physician and pharmacy when reaction occurs and call 911 if needed for immediate care   Diphenhydramine 50mg656mIV vial: Administer 25-50mg 81mM PRN for first dose reaction, rash, itching, mild reaction, nurse to call physician and pharmacy when reaction occurs   Sodium Chloride 0.9% NS 500ml I51mdminister if needed for hypovolemic blood  pressure drop or as ordered by physician after call to physician with anaphylactic reaction   Change dressing on IV access line weekly and PRN   Complete by: As directed    Flush IV access with Sodium Chloride 0.9% and Heparin 10 units/ml or 100 units/ml   Complete by: As directed    Home infusion instructions   Complete by: As directed    Instructions: Flushing of vascular access device: 0.9% NaCl pre/post medication administration and prn patency; Heparin 100 u/ml, 387m for implanted ports and Heparin 10u/ml, 587mfor all other central venous catheters.   Home infusion instructions - [facility]   Complete by: As directed    Instructions: Flush IV access with Sodium Chloride 0.9% and Heparin 10units/ml or 100units/ml   Change dressing on IV access line: Weekly and PRN   Instructions Cath Flo 87m28mAdminister for PICC  Line occlusion and as ordered by physician for other access device   [facility] pharmacist to adjust dose for: Vancomycin, Aminoglycosides and other anti-infective therapies as requested by physician   Method of administration may be changed at the discretion of home infusion pharmacist based upon assessment of the patient and/or caregiver's ability to self-administer the medication ordered   Complete by: As directed         Follow-up Information     GaiDuanne GuessA-C. Go on 09/18/2021.   Specialties: Orthopedic Surgery, Emergency Medicine Why: Appt @ 10:00 am Contact information: [id number] [phone]                  Signed: GAIFeliberto Gottron29/2023, 1:44 PM

## 2021-09-09 NOTE — Treatment Plan (Signed)
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 Baseline Creatinine 1.73 Crcl 30   Allergies  Allergen Reactions   Hydralazine Itching   Meloxicam Other (See Comments)    Causes excessive sleepiness fainting   Penicillins Hives, Itching and Other (See Comments)    Whelps Has patient had a PCN reaction causing immediate rash, facial/tongue/throat swelling, SOB or lightheadedness with hypotension: Yes Has patient had a PCN reaction causing severe rash involving mucus membranes or skin necrosis: No Has patient had a PCN reaction that required hospitalization: No Has patient had a PCN reaction occurring within the last 10 years: No If all of the above answers are "NO", then may proceed with Cephalosporin use.    Pioglitazone Itching   Tramadol Other (See Comments)    Causes excessive sleepiness Dizzy/fainting    OPAT Orders Discharge antibiotics: Ceftriaxone 2 grams Iv every 24 hours for 4 weeks End Date:10/02/21   PIC Care Per Protocol:  Labs weekly while on IV antibiotics: _X_ CBC with differential __ BMP _X_ CMP _X_ CRP _X_ ESR   _X_ Please pull PIC at completion of IV antibiotics   Fax weekly lab results  promptly to [phone]  Clinic Follow Up Appt:09/25/21 at 11.30 am   Call [phone] with any questions

## 2021-09-11 ENCOUNTER — Telehealth: Payer: Self-pay

## 2021-09-11 NOTE — Telephone Encounter (Signed)
I spoke to a the patient regarding the antibiotic. Patient will start taking antibiotic in the mornings. I also spoke with her nurse Elmyra Ricks [phone]) and advised her. Elmyra Ricks is asking if a catheter is an option for the patient if she continue with frequent urinations. Patient is not making it to the restroom in time and urinating on herself. Please advise Mackenzie Hill

## 2021-09-11 NOTE — Telephone Encounter (Signed)
Patient called complaining of frequent urination at night and unable to sleep at night since being discharged home on the IV antibiotics. Patient is asking is any of this a side effect of the IV antibiotic.  Please advise

## 2021-09-12 NOTE — Telephone Encounter (Signed)
I have spoke with Elmyra Ricks with [facility] and advised her that Dr. Delaine Lame does not recommend a foley cath. I advised her that patient can get a bedside commode or wear briefs.

## 2021-09-16 ENCOUNTER — Other Ambulatory Visit (HOSPITAL_COMMUNITY)
Admission: RE | Admit: 2021-09-16 | Discharge: 2021-09-16 | Disposition: A | Discharge status: Home / Self Care | Payer: Medicare HMO | Source: Other Acute Inpatient Hospital | Attending: Orthopedic Surgery | Admitting: Orthopedic Surgery

## 2021-09-16 DIAGNOSIS — I10 Essential (primary) hypertension: Secondary | ICD-10-CM | POA: Insufficient documentation

## 2021-09-16 DIAGNOSIS — Z452 Encounter for adjustment and management of vascular access device: Secondary | ICD-10-CM | POA: Insufficient documentation

## 2021-09-16 LAB — COMPREHENSIVE METABOLIC PANEL
ALT: 13 U/L (ref 0–44)
AST: 23 U/L (ref 15–41)
Albumin: 2.9 g/dL — ABNORMAL LOW (ref 3.5–5.0)
Alkaline Phosphatase: 86 U/L (ref 38–126)
Anion gap: 8 (ref 5–15)
BUN: 20 mg/dL (ref 8–23)
CO2: 25 mmol/L (ref 22–32)
Calcium: 9.1 mg/dL (ref 8.9–10.3)
Chloride: 104 mmol/L (ref 98–111)
Creatinine, Ser: 1.72 mg/dL — ABNORMAL HIGH (ref 0.44–1.00)
GFR, Estimated: 30 mL/min — ABNORMAL LOW (ref >60)
Glucose, Bld: 225 mg/dL — ABNORMAL HIGH (ref 70–99)
Potassium: 4.7 mmol/L (ref 3.5–5.1)
Sodium: 137 mmol/L (ref 135–145)
Total Bilirubin: 0.3 mg/dL (ref 0.3–1.2)
Total Protein: 6.1 g/dL — ABNORMAL LOW (ref 6.5–8.1)

## 2021-09-16 LAB — CBC WITH DIFFERENTIAL/PLATELET
Abs Immature Granulocytes: 0.08 10*3/uL — ABNORMAL HIGH (ref 0.00–0.07)
Basophils Absolute: 0.1 10*3/uL (ref 0.0–0.1)
Basophils Relative: 1 %
Eosinophils Absolute: 0.1 10*3/uL (ref 0.0–0.5)
Eosinophils Relative: 1 %
HCT: 24.9 % — ABNORMAL LOW (ref 36.0–46.0)
Hemoglobin: 7.8 g/dL — ABNORMAL LOW (ref 12.0–15.0)
Immature Granulocytes: 1 %
Lymphocytes Relative: 11 %
Lymphs Abs: 0.9 10*3/uL (ref 0.7–4.0)
MCH: 31 pg (ref 26.0–34.0)
MCHC: 31.3 g/dL (ref 30.0–36.0)
MCV: 98.8 fL (ref 80.0–100.0)
Monocytes Absolute: 0.5 10*3/uL (ref 0.1–1.0)
Monocytes Relative: 6 %
Neutro Abs: 6.6 10*3/uL (ref 1.7–7.7)
Neutrophils Relative %: 80 %
Platelets: 552 10*3/uL — ABNORMAL HIGH (ref 150–400)
RBC: 2.52 MIL/uL — ABNORMAL LOW (ref 3.87–5.11)
RDW: 14.8 % (ref 11.5–15.5)
WBC: 8.2 10*3/uL (ref 4.0–10.5)
nRBC: 0 % (ref 0.0–0.2)

## 2021-09-16 LAB — C-REACTIVE PROTEIN: CRP: 2 mg/dL — ABNORMAL HIGH (ref <1.0)

## 2021-09-16 LAB — SEDIMENTATION RATE: Sed Rate: 100 mm/hr — ABNORMAL HIGH (ref 0–22)

## 2021-09-25 ENCOUNTER — Encounter: Payer: Self-pay | Admitting: Infectious Diseases

## 2021-09-25 ENCOUNTER — Ambulatory Visit: Payer: Medicare HMO | Attending: Infectious Diseases | Admitting: Infectious Diseases

## 2021-09-25 ENCOUNTER — Telehealth: Payer: Self-pay

## 2021-09-25 VITALS — BP 178/81 | HR 54 | Temp 97.1°F

## 2021-09-25 DIAGNOSIS — I130 Hypertensive heart and chronic kidney disease with heart failure and stage 1 through stage 4 chronic kidney disease, or unspecified chronic kidney disease: Secondary | ICD-10-CM | POA: Diagnosis not present

## 2021-09-25 DIAGNOSIS — Z96641 Presence of right artificial hip joint: Secondary | ICD-10-CM | POA: Insufficient documentation

## 2021-09-25 DIAGNOSIS — E1122 Type 2 diabetes mellitus with diabetic chronic kidney disease: Secondary | ICD-10-CM | POA: Insufficient documentation

## 2021-09-25 DIAGNOSIS — E119 Type 2 diabetes mellitus without complications: Secondary | ICD-10-CM | POA: Insufficient documentation

## 2021-09-25 DIAGNOSIS — T8451XD Infection and inflammatory reaction due to internal right hip prosthesis, subsequent encounter: Secondary | ICD-10-CM

## 2021-09-25 DIAGNOSIS — M109 Gout, unspecified: Secondary | ICD-10-CM | POA: Insufficient documentation

## 2021-09-25 DIAGNOSIS — I509 Heart failure, unspecified: Secondary | ICD-10-CM | POA: Diagnosis not present

## 2021-09-25 DIAGNOSIS — T8450XD Infection and inflammatory reaction due to unspecified internal joint prosthesis, subsequent encounter: Secondary | ICD-10-CM

## 2021-09-25 DIAGNOSIS — N189 Chronic kidney disease, unspecified: Secondary | ICD-10-CM | POA: Diagnosis not present

## 2021-09-25 NOTE — Progress Notes (Signed)
NAME: Mackenzie Hill  DOB: [birth year]  MRN: [id number]  Date/Time: 09/25/2021 11:59 AM  Subjective:   Is here with her niece Follow up after recent hospitalization Mackenzie Hill is a 81 y.o. female with a history of  DM, HTN, Gout, CKD rt hip replacement in 2018 was recently in the hospital with rt hip pain  She had Rt hip replacement in 2018 She always had pain after the sugery 5 years ago, but in June 2023 noted a dark tender spot on the surgical scar- Her PCP gave her levaquin X 10 days on 06/20/21 -wbc was normal  - She says the levaquin improved the pain some what but only to recur She is on allopurinol for gout and followed by rheumatologist who she saw on 06/23/21 She saw Dr.Menz , ortho on 07/07/21. He sent ESR/CRP  ESR was 60 and CRP N  On 08/07/21 she saw surgeon and he did not think there was any thigh abscess She had MRI on 08/20/21 and it showed Well-defined periprosthetic fluid collection along the lateral margin of the right femoral neck extends through the anterior compartment musculature of the proximal right thigh along its the expected site of prior surgical incision. Collection extends into the overlying subcutaneous soft tissues at the anterolateral aspect of the thigh. Although difficult to measure given the elongated, slightly serpiginous course of the collection, the collection measures approximately 11 cm in length. There is no obvious connection to the overlying skin surfac Pt returned to see Dr.Menz on 8/21/with worsening apin and rt hip was aspirated and showed 16K wbc with 90% N- crystals neg- dont see any culture She was taken to OR today  Dr.Menz's note says  :" Prior incision was opened at the bottom of the incision distally there is approximately 2 cm area of necrotic fat which was cultured.  Going deep to this the deep fascia was incised and the tensor muscle retracted posteriorly and the joint capsule exposed with again tissue cultures obtained when opening the capsule  fluid was also cultured from the joint fluid but it did not appear very inflammatory.  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 ( initially on day of discharge only the superficial cultures were positive and later the joint capsule and synovial fluid were positive as well)when  She was discharged home on IV ceftrixaone She is here for follow up an doing better Pain rt hip is better No fever or chills  Past Medical History:  Diagnosis Date   Anemia    Anemia, unspecified    Hgb 11.8 - 04/2013   Arthritis    Cataract    Bilateral   CHF (congestive heart failure) ([place])    Chronic kidney disease    COPD (chronic obstructive pulmonary disease) ([place])    DDD (degenerative disc disease), lumbar    Degenerative arthritis    Diabetes mellitus type 2, uncomplicated ([place])    AODM (A1C 6.9%) 04/2013   Diabetes mellitus without complication (HCC)    Dyspnea    with exertion   Esophagitis    Gout    Uric acid 5.4 - 08/2012   Hypertension  Obesity, unspecified    Sarcoidosis, lung ([place])    reported by pt, Clinically without a biopsy   Sleep apnea    OSA--Use C-PAP    Past Surgical History:  Procedure Laterality Date   ABDOMINAL HYSTERECTOMY     age 83   ANTERIOR HIP REVISION Right 09/04/2021   Procedure: ANTERIOR HIP REVISION;  Surgeon: Hessie Knows, MD;  Location: [facility] ORS;  Service: Orthopedics;  Laterality: Right;   APPLICATION OF WOUND VAC Right 09/04/2021   Procedure: APPLICATION OF WOUND VAC;  Surgeon: Hessie Knows, MD;  Location: [facility] ORS;  Service: Orthopedics;  Laterality: Right;   CATARACT EXTRACTION Bilateral    COLONOSCOPY  11/02/2005    Hyperplastic Polyp: CBF 10/2015; Recall Ltr mailed 08/27/2015 (dw)   FOOT SURGERY Right    HALLUX VALGUS REPAIR Right    OOPHORECTOMY     TOTAL HIP ARTHROPLASTY 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   Procedure: TOTAL HIP ARTHROPLASTY ANTERIOR APPROACH;  Surgeon: Hessie Knows, MD;  Location: [facility] ORS;  Service: Orthopedics;  Laterality: Right;    Social History   Socioeconomic History   Marital status: Divorced    Spouse name: Not on file   Number of children: 1   Years of education: 2 years college   Highest education level: Not on file  Occupational History   Occupation: retired    Comment: worked at [employer]   Smoking status: Former    Packs/day: 0.70    Years: 3.00    Total pack years: 2.10    Types: Cigarettes    Start date: 01/12/1962    Quit date: 01/12/1966    Years since quitting: 55.7   Smokeless tobacco: Never  Vaping Use   Vaping Use: Never used  Substance and Sexual Activity   Alcohol use: No    Alcohol/week: 0.0 standard drinks of alcohol   Drug use: No   Sexual activity: Not on file  Other Topics Concern   Not on file  Social History Narrative   Not on file   Social Determinants of Health   Financial Resource Strain: Not on file  Food Insecurity: Not on file  Transportation Needs: Not on file  Physical Activity: Not on file  Stress: Not on file  Social Connections: Not on file  Intimate Partner Violence: Not on file    Family History  Problem Relation Age of Onset   Diabetes Mother        ovarian cancer   Hypertension Mother    Ovarian cancer Mother    Diabetes Father        hardening of arteries   Heart disease Father    Hypertension Father    Diabetes Sister    Heart disease Sister    Diabetes Brother    Heart disease Brother    Allergies  Allergen Reactions   Hydralazine Itching   Meloxicam Other (See Comments)    Causes excessive sleepiness fainting   Penicillins Hives, Itching and Other (See Comments)    [place] Has patient had a PCN  reaction causing immediate rash, facial/tongue/throat swelling, SOB or lightheadedness with hypotension: Yes Has patient had a PCN reaction causing severe rash involving mucus membranes or skin necrosis: No Has patient had a PCN reaction that required hospitalization: No Has patient had a PCN reaction occurring within the last 10 years: No If all of the above answers are "NO", then may proceed with Cephalosporin use.    Pioglitazone Itching   Tramadol Other (See Comments)  Causes excessive sleepiness Dizzy/fainting   I? Current Outpatient Medications  Medication Sig Dispense Refill   albuterol (PROVENTIL HFA;VENTOLIN HFA) 108 (90 Base) MCG/ACT inhaler Inhale 2 puffs into the lungs every 6 (six) hours as needed for wheezing or shortness of breath. 1 Inhaler 0   allopurinol (ZYLOPRIM) 100 MG tablet Take by mouth.     amLODipine (NORVASC) 5 MG tablet Take 5 mg by mouth daily.      aspirin 81 MG EC tablet Take by mouth.     budesonide-formoterol (SYMBICORT) 160-4.5 MCG/ACT inhaler Inhale 2 puffs into the lungs 2 (two) times daily.      Calcium Carbonate-Vitamin D3 (CALCIUM 600/VITAMIN D) 600-400 MG-UNIT TABS Take 2 tablets by mouth daily.     cefTRIAXone (ROCEPHIN) IVPB Inject 2 g into the vein daily for 22 days. 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 23 Units 0   cetirizine (ZYRTEC) 10 MG tablet Take 10 mg by mouth at bedtime.      Chlorhexidine Gluconate Cloth 2 % PADS Apply 6 each topically daily. 30 each 0   diclofenac sodium  (VOLTAREN) 1 % GEL Apply topically 2 (two) times daily as needed (pain).     diclofenac Sodium (VOLTAREN) 1 % GEL Apply 2 g topically 4 (four) times daily.     docusate sodium (COLACE) 100 MG capsule Take 1 capsule (100 mg total) by mouth 2 (two) times daily. 10 capsule 0   fexofenadine (ALLEGRA) 180 MG tablet Take 180 mg by mouth daily.      glipiZIDE (GLUCOTROL XL) 10 MG 24 hr tablet Take 10 mg by mouth 2 (two) times daily.      ipratropium-albuterol (DUONEB) 0.5-2.5 (3) MG/3ML SOLN Take 3 mLs by nebulization every 6 (six) hours as needed (shortness of breath or wheezing).     isosorbide mononitrate (IMDUR) 60 MG 24 hr tablet Take 180 mg by mouth daily.      levocetirizine (XYZAL) 5 MG tablet      losartan (COZAAR) 50 MG tablet Take 100 mg by mouth daily.      magnesium hydroxide (MILK OF MAGNESIA) 400 MG/5ML suspension Take 30 mLs by mouth every 4 (four) hours as needed. If constipation/ no BM for 2 days     methocarbamol (ROBAXIN) 500 MG tablet Take 1 tablet (500 mg total) by mouth every 6 (six) hours as needed for muscle spasms. 30 tablet 0   mometasone (NASONEX) 50 MCG/ACT nasal spray Place 2 sprays into the nose daily as needed (allergies).      montelukast (SINGULAIR) 10 MG tablet Take 10 mg by mouth every evening.      omeprazole (PRILOSEC) 20 MG capsule      oxybutynin (DITROPAN) 5 MG tablet      oxyCODONE (OXY IR/ROXICODONE) 5 MG immediate release tablet Take 1-2 tablets (5-10 mg total) by mouth every 4 (four) hours as needed for moderate pain (pain score 4-6). 30 tablet 0   sitaGLIPtin (JANUVIA) 50 MG tablet Take 50 mg by mouth daily.     sodium chloride flush (NS) 0.9 %  SOLN 10-40 mLs by Intracatheter route every 12 (twelve) hours. 56 vial 0   sodium chloride flush (NS) 0.9 % SOLN 10-40 mLs by Intracatheter route as needed (flush). 30 vial 0   spironolactone (ALDACTONE) 25 MG tablet Take 25 mg by mouth daily.      tiotropium (SPIRIVA) 18 MCG inhalation capsule Place 18 mcg into  inhaler and inhale daily.     ULORIC 40 MG tablet Take 40 mg by mouth daily.      vitamin B-12 (CYANOCOBALAMIN) 1000 MCG tablet Take 1,000 mcg by mouth daily.     vitamin C (ASCORBIC ACID) 500 MG tablet Take 500 mg by mouth daily.      enoxaparin (LOVENOX) 40 MG/0.4ML injection Inject 0.4 mLs (40 mg total) into the skin daily for 14 days. 5.6 mL 0   No current facility-administered medications for this visit.     Abtx:  Anti-infectives (From admission, onward)    None       REVIEW OF SYSTEMS:  Const: negative fever, negative chills, negative weight loss Eyes: negative diplopia or visual changes, negative eye pain ENT: negative coryza, negative sore throat Resp: negative cough, hemoptysis, dyspnea Cards: negative for chest pain, palpitations, lower extremity edema GU: negative for frequency, dysuria and hematuria GI: Negative for abdominal pain, diarrhea, bleeding, constipation Skin: negative for rash and pruritus Heme: negative for easy bruising and gum/nose bleeding MS: rt hip pain better Neurolo:negative for headaches, dizziness, vertigo, memory problems  Psych: negative for feelings of anxiety, depression  Endocrine: negative for thyroid, diabetes Allergy/Immunology-PCN rash Says she remmebers taking amoxicillin and was fine Objective:  VITALS:  BP (!) 178/81   Pulse (!) 54   Temp (!) 97.1 F (36.2 C) ( LDA Rt PICC PHYSICAL EXAM:  General: Alert, cooperative, no distress, appears stated age.  Head: Normocephalic, without obvious abnormality, atraumatic. Eyes: Conjunctivae clear, anicteric sclerae. Pupils are equal ENT Nares normal. No drainage or sinus tenderness. Lips, mucosa, and tongue normal. No Thrush Neck: Supple, symmetrical, no adenopathy, thyroid: non tender no carotid bruit and no JVD. Back: No CVA tenderness. Lungs: Clear to auscultation bilaterally. No Wheezing or Rhonchi. No rales. Heart: Regular rate and rhythm, no murmur, rub or gallop. Abdomen:  Soft, non-tender,not distended. Bowel sounds normal. No masses Extremities: rt hip surgical site has healed  Able to take a few steps withput walker Lymph: Cervical, supraclavicular normal. Neurologic: Grossly non-focal Pertinent Labs Lab Results CBC    Component Value Date/Time   WBC 8.2 09/16/2021 1400   RBC 2.52 (L) 09/16/2021 1400   HGB 7.8 (L) 09/16/2021 1400   HGB 11.6 (L) 01/08/2014 1109   HCT 24.9 (L) 09/16/2021 1400   HCT 35.6 01/08/2014 1109   PLT 552 (H) 09/16/2021 1400   PLT 282 01/08/2014 1109   MCV 98.8 09/16/2021 1400   MCV 93 01/08/2014 1109   MCH 31.0 09/16/2021 1400   MCHC 31.3 09/16/2021 1400   RDW 14.8 09/16/2021 1400   RDW 14.0 01/08/2014 1109   LYMPHSABS 0.9 09/16/2021 1400   MONOABS 0.5 09/16/2021 1400   EOSABS 0.1 09/16/2021 1400   BASOSABS 0.1 09/16/2021 1400   09/23/21 HB 8.7 WBC 6 PLT 563 ESR 56 ( 100 on 09/16/21) CRP 10 ( range 0-10)  AST 286 ALT 315 Alkpo4 179 TB 0.3    Latest Ref Rng & Units 09/16/2021    2:00 PM 09/05/2021    3:27 AM 09/04/2021   11:13 AM  CMP  Glucose 70 - 99 mg/dL 403  168  159   BUN 8 - 23 mg/dL 20  30  35   Creatinine 0.44 - 1.00 mg/dL 1.72  1.73  1.84   Sodium 135 - 145 mmol/L 137  138  138   Potassium 3.5 - 5.1 mmol/L 4.7  4.6  4.0   Chloride 98 - 111 mmol/L 104  113  113   CO2 22 - 32 mmol/L _0 Calcium 8.9 - 10.3 mg/dL 9.1  8.8  9.2   Total Protein 6.5 - 8.1 g/dL 6.1   7.1   Total Bilirubin 0.3 - 1.2 mg/dL 0.3   0.7   Alkaline Phos 38 - 126 U/L 86   107   AST 15 - 41 U/L 23   26   ALT 0 - 44 U/L 13   19       Microbiology: Joint capsule Actinomyces  Superficial layer - actinomyces Middle layer soft tissue actinomyces  ? 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 She  has complted 3 weeks of IV ceftriaxone She will need IV for 6 weeks ( 10/17/21)instead of 4 weeks like planned before until 10/02/21 But she now has increase in ast/alt and alk pho4 to nealry 10 times normal of the transaminases which were normal range on 09/16/21 Is this due to ceftriaxone? There are no other meds that are new except oxycodone and baclofen which she is only taking PRN now Will plan to hold ceftriaxone for 3 days and check LFT on Monday- if it is better then will have to start an alternate regimen - either amoxicillin or ertapenem.if elevated then will get hepatitis panel, [place] etc ( she is totally asymptomatic now)  DM on januvia   HTN on amlodipine  Discussed the management with patient and her niece- told the niece to keep the line flushed every day Pt would prefer to take oral antibiotic instead of IV- will decide after labs ? ?Addendum 10/01/21 Labs drawn on 9/19 shows normalization of AST and ALT. Alk po4 is 169 Pt is currently on PO doxy She will start ertapenem 599m IV every 24 hrs until 10/23/21 ( 3 weeks) after that she will have to either take amoxicillin ( test dose/challenge) or Doxy for a prolonged period . Discussed the management with patient and her niece  OPAT Orders  Ertapenem 5040mIV every 24 hours for 3 weeks  End Date:10/23/21    [facility]   Labs weekly while on IV antibiotics: _X_ CBC with differential _X_ CMP _X_ CRP _X_ ESR     _X_ Please pull PIC at completion of IV antibiotics     Fax weekly lab results  promptly to [phone]   Clinic Follow Up Appt:10/21/21 at 10.30 AM     Call [phone] any questions

## 2021-09-25 NOTE — Patient Instructions (Signed)
You are ehre for follow up of the rt hip site infection- you are on ceftriaxone- the last labs from 09/23/21 shows abnormal liver tests- I would like you not to give the antibiotic for the next few days- Will do blood work on Monday or Tuesday and depending on the results will decide what the next step would be

## 2021-09-25 NOTE — Telephone Encounter (Signed)
I spoke to Amy with [facility] and advised her that per Dr. Delaine Lame patient will be holding off on IV antibiotic for 3 days. I also gave orders for patient to have labs done by nursing on Monday instead of Tues next week. Per Amy she will send the order over to nursing and ask that the do the results STAT.  Rox Mcgriff T Brooks Sailors

## 2021-09-29 ENCOUNTER — Other Ambulatory Visit: Payer: Self-pay | Admitting: Infectious Diseases

## 2021-09-29 ENCOUNTER — Telehealth: Payer: Self-pay

## 2021-09-29 MED ORDER — DOXYCYCLINE MONOHYDRATE 100 MG PO TABS: 100.0000 mg | ORAL_TABLET | Freq: Two times a day (BID) | ORAL | 0 refills | Status: DC

## 2021-09-29 NOTE — Progress Notes (Signed)
Sent prescription for Doxycyline 100mg  PO BID until LFTS are drawn and resulted.

## 2021-09-29 NOTE — Telephone Encounter (Signed)
Kim with [facility] called regarding labs needed for the patient. Per Maudie Mercury nursing will not be able to get labs on the patient until tomorrow and she will be sure that they are done STAT. Dr. Delaine Lame informed and she has sent in Doxycycline for the patient to take BID that she will need to start today.  Patient has been advised labs will be drawn tomorrow, pick up doxy and start today.  Mackenzie Hill T Brooks Sailors

## 2021-10-01 ENCOUNTER — Telehealth: Payer: Self-pay

## 2021-10-01 NOTE — Telephone Encounter (Signed)
Received call from [place], home health nurse, requesting pull PICC orders. Advised her to leave PICC in place as Dr. Delaine Lame is working on starting patient on IV ertapenem and that [facility] has orders.   P: [phone]  Beryle Flock, RN

## 2021-10-01 NOTE — Telephone Encounter (Addendum)
[facility] aware that patient will be switch ceftriaxone to ertapenem per Dr. Delaine Lame. Per Carolynn Sayers she will work on getting first dose started in the home for the patient. Dr. Delaine Lame has spoke to the patient niece regarding the new treatment plan

## 2021-10-01 NOTE — Telephone Encounter (Signed)
Received call from [facility] with [place] requesting updated OPAT orders. She is wondering if ceftriaxone is to be discontinued. Will route to provider.   Beryle Flock, RN

## 2021-10-01 NOTE — Telephone Encounter (Signed)
I have follow up with Pam with [facility] and she stated patient is setup for first dose in the home tomorrow. Per Pam she will reach out to the patient's niece to let her know what time nursing will arrive for first dose in the home

## 2021-10-07 ENCOUNTER — Telehealth: Payer: Self-pay

## 2021-10-07 NOTE — Telephone Encounter (Addendum)
Elmyra Ricks, RN [phone]) with [facility] home [facility] called to report that she was able to flush patient picc line but was unable to get enough blood for blood draw. A few days ago picc was only 1 cm exposed and it is now 5 cm exposed. Elmyra Ricks stated the picc line is not leaking and she is able to give abx but the stat lock is almost at the bending part of her arm and there is not enough room for dressing to cover the picc line. Elmyra Ricks stated she does not think it will last long if she was to continue to give abx Per Dr. Delaine Lame patient will need to have the picc line replaced. Patient has been scheduled with [facility] to have it replaced on 10/09/21 @ 12pm . I have also followed up with Elmyra Ricks regarding the appointment. Day Surgery will follow up with the patient regarding the appointment.  Nissim Fleischer T Brooks Sailors

## 2021-10-08 ENCOUNTER — Encounter: Payer: Self-pay | Admitting: Infectious Diseases

## 2021-10-09 ENCOUNTER — Ambulatory Visit
Admission: RE | Admit: 2021-10-09 | Discharge: 2021-10-09 | Disposition: A | Discharge status: Home / Self Care | Payer: Medicare HMO | Source: Ambulatory Visit | Attending: Infectious Diseases | Admitting: Infectious Diseases

## 2021-10-09 ENCOUNTER — Ambulatory Visit
Admission: RE | Admit: 2021-10-09 | Discharge: 2021-10-09 | Disposition: A | Discharge status: Home / Self Care | Payer: Self-pay | Source: Ambulatory Visit | Attending: Infectious Diseases | Admitting: Infectious Diseases

## 2021-10-09 VITALS — BP 139/55 | HR 55 | Temp 96.7°F

## 2021-10-09 DIAGNOSIS — X58XXXA Exposure to other specified factors, initial encounter: Secondary | ICD-10-CM | POA: Insufficient documentation

## 2021-10-09 DIAGNOSIS — Z96649 Presence of unspecified artificial hip joint: Secondary | ICD-10-CM | POA: Insufficient documentation

## 2021-10-09 DIAGNOSIS — T8459XA Infection and inflammatory reaction due to other internal joint prosthesis, initial encounter: Secondary | ICD-10-CM | POA: Insufficient documentation

## 2021-10-09 MED ORDER — SODIUM CHLORIDE 0.9% FLUSH: 10.0000 mL | INTRAVENOUS | Status: DC | PRN

## 2021-10-09 MED ORDER — CHLORHEXIDINE GLUCONATE CLOTH 2 % EX PADS: 6.0000 | MEDICATED_PAD | Freq: Every day | CUTANEOUS | Status: DC

## 2021-10-09 MED ORDER — SODIUM CHLORIDE 0.9% FLUSH: 10.0000 mL | Freq: Two times a day (BID) | INTRAVENOUS | Status: DC

## 2021-10-09 NOTE — Progress Notes (Signed)
PICC line replaced by IV team  nurse. No signs of acute distress. Patient was check out with niece.

## 2021-10-09 NOTE — Progress Notes (Signed)
Peripherally Inserted Central Catheter Placement  PICC Exchange  Consent signed via patient. The IV Nurse has discussed with the patient and/or persons authorized to consent for the patient, the purpose of this procedure and the potential benefits and risks involved with this procedure.  The benefits include less needle sticks, lab draws from the catheter, and the patient may be discharged home with the catheter. Risks include, but not limited to, infection, bleeding, blood clot (thrombus formation), and puncture of an artery; nerve damage and irregular heartbeat and possibility to perform a PICC exchange if needed/ordered by physician.  Alternatives to this procedure were also discussed.  [facility] PICC patient education guide, fact sheet on infection prevention and patient information card has been provided to patient /or left at bedside.    PICC Placement Documentation  PICC Single Lumen 10/09/21 Right Brachial 38 cm 0 cm (Active)  Indication for Insertion or Continuance of Line Home intravenous therapies (PICC only) 10/09/21 1320  Exposed Catheter (cm) 0 cm 10/09/21 1320  Site Assessment Clean, Dry, Intact 10/09/21 1320  Line Status Flushed;Saline locked;Blood return noted 10/09/21 1320  Dressing Type Transparent;Securing device 10/09/21 1320  Dressing Status Antimicrobial disc in place;Clean, Dry, Intact 10/09/21 1320  Safety Lock Not Applicable 33/54/56 2563  Line Care Connections checked and tightened 10/09/21 1320  Line Adjustment (NICU/IV Team Only) No 10/09/21 1320  Dressing Intervention New dressing 10/09/21 1320  Dressing Change Due 10/16/21 10/09/21 1320       [place] 10/09/2021, 1:23 PM

## 2021-10-21 ENCOUNTER — Inpatient Hospital Stay
Admission: EM | Admit: 2021-10-21 | Discharge: 2021-10-24 | DRG: 271 | Disposition: A | Discharge status: Home Health | Payer: Medicare HMO | Attending: Internal Medicine | Admitting: Internal Medicine

## 2021-10-21 ENCOUNTER — Encounter: Payer: Self-pay | Admitting: Infectious Diseases

## 2021-10-21 ENCOUNTER — Ambulatory Visit (HOSPITAL_BASED_OUTPATIENT_CLINIC_OR_DEPARTMENT_OTHER): Payer: Medicare HMO | Admitting: Infectious Diseases

## 2021-10-21 ENCOUNTER — Other Ambulatory Visit: Payer: Self-pay

## 2021-10-21 ENCOUNTER — Encounter: Payer: Self-pay | Admitting: *Deleted

## 2021-10-21 ENCOUNTER — Other Ambulatory Visit
Admission: RE | Admit: 2021-10-21 | Discharge: 2021-10-21 | Disposition: A | Discharge status: Home / Self Care | Payer: Medicare HMO | Source: Ambulatory Visit | Attending: Infectious Diseases | Admitting: Infectious Diseases

## 2021-10-21 ENCOUNTER — Ambulatory Visit
Admission: RE | Admit: 2021-10-21 | Discharge: 2021-10-21 | Disposition: A | Discharge status: Home / Self Care | Payer: Medicare HMO | Source: Ambulatory Visit | Attending: Infectious Diseases | Admitting: Infectious Diseases

## 2021-10-21 VITALS — BP 145/74 | HR 79 | Temp 98.0°F | Resp 16 | Ht 64.0 in | Wt 165.0 lb

## 2021-10-21 DIAGNOSIS — Z79899 Other long term (current) drug therapy: Secondary | ICD-10-CM | POA: Diagnosis not present

## 2021-10-21 DIAGNOSIS — I5189 Other ill-defined heart diseases: Secondary | ICD-10-CM

## 2021-10-21 DIAGNOSIS — M1A9XX0 Chronic gout, unspecified, without tophus (tophi): Secondary | ICD-10-CM | POA: Diagnosis present

## 2021-10-21 DIAGNOSIS — T8451XA Infection and inflammatory reaction due to internal right hip prosthesis, initial encounter: Secondary | ICD-10-CM | POA: Diagnosis present

## 2021-10-21 DIAGNOSIS — E1122 Type 2 diabetes mellitus with diabetic chronic kidney disease: Secondary | ICD-10-CM | POA: Diagnosis present

## 2021-10-21 DIAGNOSIS — I82890 Acute embolism and thrombosis of other specified veins: Secondary | ICD-10-CM | POA: Diagnosis present

## 2021-10-21 DIAGNOSIS — T82868A Thrombosis of vascular prosthetic devices, implants and grafts, initial encounter: Secondary | ICD-10-CM | POA: Diagnosis present

## 2021-10-21 DIAGNOSIS — I82622 Acute embolism and thrombosis of deep veins of left upper extremity: Secondary | ICD-10-CM

## 2021-10-21 DIAGNOSIS — I82621 Acute embolism and thrombosis of deep veins of right upper extremity: Secondary | ICD-10-CM

## 2021-10-21 DIAGNOSIS — Z90710 Acquired absence of both cervix and uterus: Secondary | ICD-10-CM

## 2021-10-21 DIAGNOSIS — T8459XS Infection and inflammatory reaction due to other internal joint prosthesis, sequela: Secondary | ICD-10-CM | POA: Diagnosis not present

## 2021-10-21 DIAGNOSIS — Y848 Other medical procedures as the cause of abnormal reaction of the patient, or of later complication, without mention of misadventure at the time of the procedure: Secondary | ICD-10-CM | POA: Diagnosis present

## 2021-10-21 DIAGNOSIS — D649 Anemia, unspecified: Secondary | ICD-10-CM | POA: Diagnosis present

## 2021-10-21 DIAGNOSIS — I10 Essential (primary) hypertension: Secondary | ICD-10-CM

## 2021-10-21 DIAGNOSIS — Z6830 Body mass index (BMI) 30.0-30.9, adult: Secondary | ICD-10-CM

## 2021-10-21 DIAGNOSIS — Z96641 Presence of right artificial hip joint: Secondary | ICD-10-CM | POA: Diagnosis present

## 2021-10-21 DIAGNOSIS — D638 Anemia in other chronic diseases classified elsewhere: Secondary | ICD-10-CM

## 2021-10-21 DIAGNOSIS — Z885 Allergy status to narcotic agent status: Secondary | ICD-10-CM

## 2021-10-21 DIAGNOSIS — Z8249 Family history of ischemic heart disease and other diseases of the circulatory system: Secondary | ICD-10-CM | POA: Diagnosis not present

## 2021-10-21 DIAGNOSIS — D860 Sarcoidosis of lung: Secondary | ICD-10-CM | POA: Diagnosis present

## 2021-10-21 DIAGNOSIS — E669 Obesity, unspecified: Secondary | ICD-10-CM | POA: Diagnosis present

## 2021-10-21 DIAGNOSIS — T8459XA Infection and inflammatory reaction due to other internal joint prosthesis, initial encounter: Secondary | ICD-10-CM | POA: Diagnosis not present

## 2021-10-21 DIAGNOSIS — Y831 Surgical operation with implant of artificial internal device as the cause of abnormal reaction of the patient, or of later complication, without mention of misadventure at the time of the procedure: Secondary | ICD-10-CM | POA: Diagnosis present

## 2021-10-21 DIAGNOSIS — T8451XD Infection and inflammatory reaction due to internal right hip prosthesis, subsequent encounter: Secondary | ICD-10-CM | POA: Diagnosis not present

## 2021-10-21 DIAGNOSIS — G4733 Obstructive sleep apnea (adult) (pediatric): Secondary | ICD-10-CM

## 2021-10-21 DIAGNOSIS — I509 Heart failure, unspecified: Secondary | ICD-10-CM | POA: Diagnosis present

## 2021-10-21 DIAGNOSIS — Z7951 Long term (current) use of inhaled steroids: Secondary | ICD-10-CM

## 2021-10-21 DIAGNOSIS — N1832 Chronic kidney disease, stage 3b: Secondary | ICD-10-CM | POA: Diagnosis present

## 2021-10-21 DIAGNOSIS — E11649 Type 2 diabetes mellitus with hypoglycemia without coma: Secondary | ICD-10-CM

## 2021-10-21 DIAGNOSIS — Z7984 Long term (current) use of oral hypoglycemic drugs: Secondary | ICD-10-CM

## 2021-10-21 DIAGNOSIS — I130 Hypertensive heart and chronic kidney disease with heart failure and stage 1 through stage 4 chronic kidney disease, or unspecified chronic kidney disease: Secondary | ICD-10-CM | POA: Diagnosis present

## 2021-10-21 DIAGNOSIS — D631 Anemia in chronic kidney disease: Secondary | ICD-10-CM | POA: Diagnosis present

## 2021-10-21 DIAGNOSIS — J449 Chronic obstructive pulmonary disease, unspecified: Secondary | ICD-10-CM

## 2021-10-21 DIAGNOSIS — Z96649 Presence of unspecified artificial hip joint: Secondary | ICD-10-CM | POA: Diagnosis not present

## 2021-10-21 DIAGNOSIS — J690 Pneumonitis due to inhalation of food and vomit: Secondary | ICD-10-CM | POA: Diagnosis present

## 2021-10-21 DIAGNOSIS — Z86718 Personal history of other venous thrombosis and embolism: Secondary | ICD-10-CM

## 2021-10-21 DIAGNOSIS — K219 Gastro-esophageal reflux disease without esophagitis: Secondary | ICD-10-CM | POA: Diagnosis present

## 2021-10-21 DIAGNOSIS — Z881 Allergy status to other antibiotic agents status: Secondary | ICD-10-CM

## 2021-10-21 DIAGNOSIS — Z87891 Personal history of nicotine dependence: Secondary | ICD-10-CM

## 2021-10-21 DIAGNOSIS — Z833 Family history of diabetes mellitus: Secondary | ICD-10-CM

## 2021-10-21 DIAGNOSIS — I871 Compression of vein: Secondary | ICD-10-CM | POA: Diagnosis not present

## 2021-10-21 DIAGNOSIS — M1990 Unspecified osteoarthritis, unspecified site: Secondary | ICD-10-CM | POA: Diagnosis present

## 2021-10-21 DIAGNOSIS — Z7982 Long term (current) use of aspirin: Secondary | ICD-10-CM

## 2021-10-21 DIAGNOSIS — I82A11 Acute embolism and thrombosis of right axillary vein: Secondary | ICD-10-CM | POA: Diagnosis not present

## 2021-10-21 DIAGNOSIS — N183 Chronic kidney disease, stage 3 unspecified: Secondary | ICD-10-CM

## 2021-10-21 DIAGNOSIS — Z888 Allergy status to other drugs, medicaments and biological substances status: Secondary | ICD-10-CM

## 2021-10-21 DIAGNOSIS — I82B11 Acute embolism and thrombosis of right subclavian vein: Secondary | ICD-10-CM | POA: Diagnosis present

## 2021-10-21 DIAGNOSIS — R001 Bradycardia, unspecified: Secondary | ICD-10-CM

## 2021-10-21 DIAGNOSIS — Z880 Allergy status to penicillin: Secondary | ICD-10-CM

## 2021-10-21 DIAGNOSIS — M109 Gout, unspecified: Secondary | ICD-10-CM | POA: Diagnosis present

## 2021-10-21 LAB — COMPREHENSIVE METABOLIC PANEL
ALT: 22 U/L (ref 0–44)
AST: 26 U/L (ref 15–41)
Albumin: 3.5 g/dL (ref 3.5–5.0)
Alkaline Phosphatase: 134 U/L — ABNORMAL HIGH (ref 38–126)
Anion gap: 7 (ref 5–15)
BUN: 38 mg/dL — ABNORMAL HIGH (ref 8–23)
CO2: 21 mmol/L — ABNORMAL LOW (ref 22–32)
Calcium: 9.3 mg/dL (ref 8.9–10.3)
Chloride: 108 mmol/L (ref 98–111)
Creatinine, Ser: 2.28 mg/dL — ABNORMAL HIGH (ref 0.44–1.00)
GFR, Estimated: 21 mL/min — ABNORMAL LOW (ref >60)
Glucose, Bld: 189 mg/dL — ABNORMAL HIGH (ref 70–99)
Potassium: 4.1 mmol/L (ref 3.5–5.1)
Sodium: 136 mmol/L (ref 135–145)
Total Bilirubin: 0.6 mg/dL (ref 0.3–1.2)
Total Protein: 7.9 g/dL (ref 6.5–8.1)

## 2021-10-21 LAB — APTT: aPTT: 31 seconds (ref 24–36)

## 2021-10-21 LAB — CBC WITH DIFFERENTIAL/PLATELET
Abs Immature Granulocytes: 0.02 10*3/uL (ref 0.00–0.07)
Basophils Absolute: 0.1 10*3/uL (ref 0.0–0.1)
Basophils Relative: 1 %
Eosinophils Absolute: 0.2 10*3/uL (ref 0.0–0.5)
Eosinophils Relative: 3 %
HCT: 29.5 % — ABNORMAL LOW (ref 36.0–46.0)
Hemoglobin: 9.7 g/dL — ABNORMAL LOW (ref 12.0–15.0)
Immature Granulocytes: 0 %
Lymphocytes Relative: 17 %
Lymphs Abs: 1.2 10*3/uL (ref 0.7–4.0)
MCH: 30.8 pg (ref 26.0–34.0)
MCHC: 32.9 g/dL (ref 30.0–36.0)
MCV: 93.7 fL (ref 80.0–100.0)
Monocytes Absolute: 0.4 10*3/uL (ref 0.1–1.0)
Monocytes Relative: 5 %
Neutro Abs: 5.5 10*3/uL (ref 1.7–7.7)
Neutrophils Relative %: 74 %
Platelets: 360 10*3/uL (ref 150–400)
RBC: 3.15 MIL/uL — ABNORMAL LOW (ref 3.87–5.11)
RDW: 13.5 % (ref 11.5–15.5)
WBC: 7.4 10*3/uL (ref 4.0–10.5)
nRBC: 0 % (ref 0.0–0.2)

## 2021-10-21 LAB — PROTIME-INR
INR: 1.2 (ref 0.8–1.2)
Prothrombin Time: 14.8 seconds (ref 11.4–15.2)

## 2021-10-21 LAB — C-REACTIVE PROTEIN: CRP: 2 mg/dL — ABNORMAL HIGH (ref <1.0)

## 2021-10-21 LAB — SEDIMENTATION RATE: Sed Rate: 72 mm/hr — ABNORMAL HIGH (ref 0–30)

## 2021-10-21 MED ORDER — CHLORHEXIDINE GLUCONATE CLOTH 2 % EX PADS
6.0000 | MEDICATED_PAD | Freq: Every day | CUTANEOUS | Status: DC
  Filled 2021-10-21: qty 6

## 2021-10-21 MED ORDER — ACETAMINOPHEN 325 MG PO TABS: 650.0000 mg | ORAL_TABLET | Freq: Four times a day (QID) | ORAL | Status: DC | PRN

## 2021-10-21 MED ORDER — ONDANSETRON HCL 4 MG PO TABS: 4.0000 mg | ORAL_TABLET | Freq: Four times a day (QID) | ORAL | Status: DC | PRN

## 2021-10-21 MED ORDER — SODIUM CHLORIDE 0.9% FLUSH: 10.0000 mL | INTRAVENOUS | Status: DC | PRN

## 2021-10-21 MED ORDER — SPIRONOLACTONE 25 MG PO TABS
25.0000 mg | ORAL_TABLET | Freq: Every day | ORAL | Status: DC
  Administered 2021-10-22 – 2021-10-24 (×2): 25 mg via ORAL
  Filled 2021-10-21 (×3): qty 1

## 2021-10-21 MED ORDER — VITAMIN C 500 MG PO TABS
500.0000 mg | ORAL_TABLET | Freq: Every day | ORAL | Status: DC
  Administered 2021-10-22 – 2021-10-24 (×2): 500 mg via ORAL
  Filled 2021-10-21 (×3): qty 1

## 2021-10-21 MED ORDER — MAGNESIUM HYDROXIDE 400 MG/5ML PO SUSP: 30.0000 mL | ORAL | Status: DC | PRN

## 2021-10-21 MED ORDER — LORATADINE 10 MG PO TABS
10.0000 mg | ORAL_TABLET | Freq: Every day | ORAL | Status: DC
  Administered 2021-10-22 – 2021-10-24 (×2): 10 mg via ORAL
  Filled 2021-10-21 (×3): qty 1

## 2021-10-21 MED ORDER — SODIUM CHLORIDE 0.9% FLUSH
10.0000 mL | Freq: Two times a day (BID) | INTRAVENOUS | Status: DC
  Administered 2021-10-24: 10 mL

## 2021-10-21 MED ORDER — ISOSORBIDE MONONITRATE ER 30 MG PO TB24
180.0000 mg | ORAL_TABLET | Freq: Every day | ORAL | Status: DC
  Administered 2021-10-22 – 2021-10-24 (×2): 180 mg via ORAL
  Filled 2021-10-21: qty 6
  Filled 2021-10-21: qty 3
  Filled 2021-10-21: qty 6

## 2021-10-21 MED ORDER — FLUTICASONE PROPIONATE 50 MCG/ACT NA SUSP
2.0000 | Freq: Every day | NASAL | Status: DC
  Filled 2021-10-21: qty 16

## 2021-10-21 MED ORDER — LOSARTAN POTASSIUM 50 MG PO TABS
100.0000 mg | ORAL_TABLET | Freq: Every day | ORAL | Status: DC
  Administered 2021-10-22 – 2021-10-24 (×2): 100 mg via ORAL
  Filled 2021-10-21 (×3): qty 2

## 2021-10-21 MED ORDER — VITAMIN B-12 1000 MCG PO TABS
1000.0000 ug | ORAL_TABLET | Freq: Every day | ORAL | Status: DC
  Administered 2021-10-22 – 2021-10-24 (×2): 1000 ug via ORAL
  Filled 2021-10-21 (×4): qty 1

## 2021-10-21 MED ORDER — DOCUSATE SODIUM 100 MG PO CAPS
100.0000 mg | ORAL_CAPSULE | Freq: Two times a day (BID) | ORAL | Status: DC
  Administered 2021-10-22 – 2021-10-24 (×4): 100 mg via ORAL
  Filled 2021-10-21 (×6): qty 1

## 2021-10-21 MED ORDER — MONTELUKAST SODIUM 10 MG PO TABS
10.0000 mg | ORAL_TABLET | Freq: Every evening | ORAL | Status: DC
  Administered 2021-10-22 – 2021-10-23 (×2): 10 mg via ORAL
  Filled 2021-10-21 (×2): qty 1

## 2021-10-21 MED ORDER — DICLOFENAC SODIUM 1 % EX GEL
2.0000 g | Freq: Four times a day (QID) | CUTANEOUS | Status: DC
  Filled 2021-10-21: qty 100

## 2021-10-21 MED ORDER — ONDANSETRON HCL 4 MG/2ML IJ SOLN: 4.0000 mg | Freq: Four times a day (QID) | INTRAMUSCULAR | Status: DC | PRN

## 2021-10-21 MED ORDER — TRAZODONE HCL 50 MG PO TABS: 25.0000 mg | ORAL_TABLET | Freq: Every evening | ORAL | Status: DC | PRN

## 2021-10-21 MED ORDER — MAGNESIUM HYDROXIDE 400 MG/5ML PO SUSP: 30.0000 mL | Freq: Every day | ORAL | Status: DC | PRN

## 2021-10-21 MED ORDER — TIOTROPIUM BROMIDE MONOHYDRATE 18 MCG IN CAPS
18.0000 ug | ORAL_CAPSULE | Freq: Every day | RESPIRATORY_TRACT | Status: DC
  Administered 2021-10-24: 18 ug via RESPIRATORY_TRACT
  Filled 2021-10-21: qty 5

## 2021-10-21 MED ORDER — OYSTER SHELL CALCIUM/D3 500-5 MG-MCG PO TABS
2.0000 | ORAL_TABLET | Freq: Every day | ORAL | Status: DC
  Administered 2021-10-22 – 2021-10-24 (×2): 2 via ORAL
  Filled 2021-10-21 (×3): qty 2

## 2021-10-21 MED ORDER — ALBUTEROL SULFATE HFA 108 (90 BASE) MCG/ACT IN AERS: 2.0000 | INHALATION_SPRAY | Freq: Four times a day (QID) | RESPIRATORY_TRACT | Status: DC | PRN

## 2021-10-21 MED ORDER — ASPIRIN 81 MG PO TBEC
81.0000 mg | DELAYED_RELEASE_TABLET | Freq: Every day | ORAL | Status: DC
  Administered 2021-10-22 – 2021-10-24 (×2): 81 mg via ORAL
  Filled 2021-10-21 (×3): qty 1

## 2021-10-21 MED ORDER — HEPARIN (PORCINE) 25000 UT/250ML-% IV SOLN
1050.0000 [IU]/h | INTRAVENOUS | Status: DC
  Administered 2021-10-21: 1100 [IU]/h via INTRAVENOUS
  Administered 2021-10-22: 1050 [IU]/h via INTRAVENOUS
  Filled 2021-10-21 (×2): qty 250

## 2021-10-21 MED ORDER — MOMETASONE FURO-FORMOTEROL FUM 200-5 MCG/ACT IN AERO
2.0000 | INHALATION_SPRAY | Freq: Two times a day (BID) | RESPIRATORY_TRACT | Status: DC
  Administered 2021-10-23 – 2021-10-24 (×2): 2 via RESPIRATORY_TRACT
  Filled 2021-10-21: qty 8.8

## 2021-10-21 MED ORDER — IPRATROPIUM-ALBUTEROL 0.5-2.5 (3) MG/3ML IN SOLN: 3.0000 mL | Freq: Four times a day (QID) | RESPIRATORY_TRACT | Status: DC | PRN

## 2021-10-21 MED ORDER — LINAGLIPTIN 5 MG PO TABS
5.0000 mg | ORAL_TABLET | Freq: Every day | ORAL | Status: DC
  Administered 2021-10-22: 5 mg via ORAL
  Filled 2021-10-21 (×2): qty 1

## 2021-10-21 MED ORDER — ACETAMINOPHEN 650 MG RE SUPP: 650.0000 mg | Freq: Four times a day (QID) | RECTAL | Status: DC | PRN

## 2021-10-21 MED ORDER — SODIUM CHLORIDE 0.9 % IV SOLN: INTRAVENOUS | Status: DC

## 2021-10-21 MED ORDER — OXYBUTYNIN CHLORIDE 5 MG PO TABS
5.0000 mg | ORAL_TABLET | Freq: Two times a day (BID) | ORAL | Status: DC
  Administered 2021-10-22 – 2021-10-24 (×5): 5 mg via ORAL
  Filled 2021-10-21 (×7): qty 1

## 2021-10-21 MED ORDER — ALLOPURINOL 100 MG PO TABS
100.0000 mg | ORAL_TABLET | Freq: Every day | ORAL | Status: DC
  Administered 2021-10-22 – 2021-10-24 (×2): 100 mg via ORAL
  Filled 2021-10-21 (×3): qty 1

## 2021-10-21 MED ORDER — GLIPIZIDE ER 10 MG PO TB24
10.0000 mg | ORAL_TABLET | Freq: Two times a day (BID) | ORAL | Status: DC
  Filled 2021-10-21 (×2): qty 1

## 2021-10-21 MED ORDER — POLYETHYLENE GLYCOL 3350 17 G PO PACK: 17.0000 g | PACK | Freq: Every day | ORAL | Status: DC | PRN

## 2021-10-21 MED ORDER — HEPARIN BOLUS VIA INFUSION
4500.0000 [IU] | Freq: Once | INTRAVENOUS | Status: AC
  Administered 2021-10-21: 4500 [IU] via INTRAVENOUS
  Filled 2021-10-21: qty 4500

## 2021-10-21 MED ORDER — OXYCODONE HCL 5 MG PO TABS: 5.0000 mg | ORAL_TABLET | ORAL | Status: DC | PRN

## 2021-10-21 MED ORDER — AMLODIPINE BESYLATE 5 MG PO TABS
5.0000 mg | ORAL_TABLET | Freq: Every day | ORAL | Status: DC
  Administered 2021-10-22 – 2021-10-24 (×2): 5 mg via ORAL
  Filled 2021-10-21 (×3): qty 1

## 2021-10-21 NOTE — H&P (Signed)
[place]   PATIENT NAME: Mackenzie Hill    MR#:  [id number]  DATE OF BIRTH:  1941/01/10  DATE OF ADMISSION:  10/21/2021  PRIMARY CARE PHYSICIAN: Idelle Crouch, MD   Patient is coming from: Home  REQUESTING/REFERRING PHYSICIAN:  Rada Hay, MD  CHIEF COMPLAINT:  Right upper extremity swelling   HISTORY OF PRESENT ILLNESS:  Mackenzie Hill is a 81 y.o. African-American female with medical history significant for COPD, CHF, DDD, type 2 diabetes mellitus, hypertension and sarcoidosis as well as gout, who presented to the emergency room with acute onset of worsening right upper extremity swelling with associated mild discomfort.  The patient had a right THA in 2018 and was recently admitted for infected prosthesis and managed with IV antibiotics here for a few days followed by outpatient IV antibiotic therapy via PICC line.  She has been followed by Dr. Tama High who saw her today and noticed her right upper extremity swelling for which she was referred to the emergency room for concern about DVT.  She denies any fever or chills.  No erythema or warmth or significant tenderness.  No chest pain or cough or wheezing or hemoptysis.  No dysuria, oliguria or hematuria or flank pain.  No bleeding diathesis.  ED Course: When he came to the ER BP was 169/65 with heart rate of 55 and temperature 96.7.  Labs revealed a BUN of 38 and creatinine 2.28 compared to 20/1.72  EKG as reviewed by me : EKG showed likely accelerated junctional rhythm with low voltage QRS and T wave inversion laterally. Imaging: Right upper extremity venous Doppler revealed extensive DVT involving the subclavian, axillary and brachial veins.  The patient was given IV heparin bolus and drip.  Contact was made with Dr. Delana Meyer who recommended thrombectomy.  The patient will be kept n.p.o. after midnight.  She will be admitted to a medical telemetry bed for further evaluation and management. PAST MEDICAL  HISTORY:   Past Medical History:  Diagnosis Date   Anemia    Anemia, unspecified    Hgb 11.8 - 04/2013   Arthritis    Cataract    Bilateral   CHF (congestive heart failure) (HCC)    Chronic kidney disease    COPD (chronic obstructive pulmonary disease) (HCC)    DDD (degenerative disc disease), lumbar    Degenerative arthritis    Diabetes mellitus type 2, uncomplicated (HCC)    AODM (A1C 6.9%) 04/2013   Diabetes mellitus without complication (HCC)    Dyspnea    with exertion   Esophagitis    Gout    Uric acid 5.4 - 08/2012   Hypertension    Obesity, unspecified    Sarcoidosis, lung ([place])    reported by pt, Clinically without a biopsy   Sleep apnea    OSA--Use C-PAP    PAST SURGICAL HISTORY:   Past Surgical History:  Procedure Laterality Date   ABDOMINAL HYSTERECTOMY     age 74   ANTERIOR HIP REVISION Right 09/04/2021   Procedure: ANTERIOR HIP REVISION;  Surgeon: Hessie Knows, MD;  Location: [facility] ORS;  Service: Orthopedics;  Laterality: Right;   APPLICATION OF WOUND VAC Right 09/04/2021   Procedure: APPLICATION OF WOUND VAC;  Surgeon: Hessie Knows, MD;  Location: [facility] ORS;  Service: Orthopedics;  Laterality: Right;   CATARACT EXTRACTION Bilateral    COLONOSCOPY  11/02/2005   Hyperplastic Polyp: CBF 10/2015; Recall Ltr mailed 08/27/2015 (dw)   FOOT SURGERY Right  HALLUX VALGUS REPAIR Right    OOPHORECTOMY     TOTAL HIP ARTHROPLASTY 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   Procedure: TOTAL HIP ARTHROPLASTY ANTERIOR APPROACH;  Surgeon: Hessie Knows, MD;  Location: [facility] ORS;  Service: Orthopedics;  Laterality: Right;    SOCIAL HISTORY:   Social History   Tobacco Use   Smoking status: Former    Packs/day: 0.70    Years: 3.00    Total pack years: 2.10    Types: Cigarettes    Start date: 01/12/1962    Quit date: 01/12/1966    Years since quitting: 55.8   Smokeless tobacco: Never  Substance Use Topics   Alcohol use: No    Alcohol/week: 0.0 standard drinks of alcohol    FAMILY  HISTORY:   Family History  Problem Relation Age of Onset   Diabetes Mother        ovarian cancer   Hypertension Mother    Ovarian cancer Mother    Diabetes Father        hardening of arteries   Heart disease Father    Hypertension Father    Diabetes Sister    Heart disease Sister    Diabetes Brother    Heart disease Brother     DRUG ALLERGIES:   Allergies  Allergen Reactions   Hydralazine Itching   Meloxicam Other (See Comments)    Causes excessive sleepiness fainting   Penicillins Hives, Itching and Other (See Comments)    [place] Has patient had a PCN reaction causing immediate rash, facial/tongue/throat swelling, SOB or lightheadedness with hypotension: Yes Has patient had a PCN reaction causing severe rash involving mucus membranes or skin necrosis: No Has patient had a PCN reaction that required hospitalization: No Has patient had a PCN reaction occurring within the last 10 years: No If all of the above answers are "NO", then may proceed with Cephalosporin use.    Pioglitazone Itching   Tramadol Other (See Comments)    Causes excessive sleepiness Dizzy/fainting    REVIEW OF SYSTEMS:   ROS As per history of present illness. All pertinent systems were reviewed above. Constitutional, HEENT, cardiovascular, respiratory, GI, GU, musculoskeletal, neuro, psychiatric, endocrine, integumentary and hematologic systems were reviewed and are otherwise negative/unremarkable except for positive findings mentioned above in the HPI.   MEDICATIONS AT HOME:   Prior to Admission medications   Medication Sig Start Date End Date Taking? Authorizing Provider  albuterol (PROVENTIL HFA;VENTOLIN HFA) 108 (90 Base) MCG/ACT inhaler Inhale 2 puffs into the lungs every 6 (six) hours as needed for wheezing or shortness of breath. 04/06/16   Laban Emperor, PA-C  allopurinol (ZYLOPRIM) 100 MG tablet Take by mouth. 08/11/18   [provider]  amLODipine (NORVASC) 5 MG tablet Take 5 mg  by mouth daily.     [provider]  aspirin 81 MG EC tablet Take by mouth. 06/14/19   [provider]  budesonide-formoterol (SYMBICORT) 160-4.5 MCG/ACT inhaler Inhale 2 puffs into the lungs 2 (two) times daily.     [provider]  Calcium Carbonate-Vitamin D3 (CALCIUM 600/VITAMIN D) 600-400 MG-UNIT TABS Take 2 tablets by mouth daily.    [provider]  cetirizine (ZYRTEC) 10 MG tablet Take 10 mg by mouth at bedtime.     [provider]  Chlorhexidine Gluconate Cloth 2 % PADS Apply 6 each topically daily. 09/10/21   Duanne Guess, PA-C  diclofenac sodium (VOLTAREN) 1 % GEL Apply topically 2 (two) times daily as needed (pain).    [provider]  diclofenac Sodium (VOLTAREN) 1 % GEL Apply 2 g topically 4 (four) times daily. 02/27/15   [provider]  docusate sodium (COLACE) 100 MG capsule Take 1 capsule (100 mg total) by mouth 2 (two) times daily. 09/09/21   Duanne Guess, PA-C  doxycycline (ADOXA) 100 MG tablet Take 1 tablet (100 mg total) by mouth 2 (two) times daily. Patient not taking: Reported on 10/21/2021 09/29/21   Tsosie Billing, MD  enoxaparin (LOVENOX) 40 MG/0.4ML injection Inject 0.4 mLs (40 mg total) into the skin daily for 14 days. Patient not taking: Reported on 10/21/2021 09/09/21 09/23/21  Duanne Guess, PA-C  fexofenadine (ALLEGRA) 180 MG tablet Take 180 mg by mouth daily.     [provider]  glipiZIDE (GLUCOTROL XL) 10 MG 24 hr tablet Take 10 mg by mouth 2 (two) times daily.     [provider]  ipratropium-albuterol (DUONEB) 0.5-2.5 (3) MG/3ML SOLN Take 3 mLs by nebulization every 6 (six) hours as needed (shortness of breath or wheezing).    [provider]  isosorbide mononitrate (IMDUR) 60 MG 24 hr tablet Take 180 mg by mouth daily.  04/30/14   [provider]  levocetirizine (XYZAL) 5 MG tablet  07/20/20   [provider]  losartan (COZAAR) 50 MG tablet Take  100 mg by mouth daily.  10/17/13   [provider]  magnesium hydroxide (MILK OF MAGNESIA) 400 MG/5ML suspension Take 30 mLs by mouth every 4 (four) hours as needed. If constipation/ no BM for 2 days    [provider]  methocarbamol (ROBAXIN) 500 MG tablet Take 1 tablet (500 mg total) by mouth every 6 (six) hours as needed for muscle spasms. Patient not taking: Reported on 10/21/2021 09/09/21   Duanne Guess, PA-C  mometasone (NASONEX) 50 MCG/ACT nasal spray Place 2 sprays into the nose daily as needed (allergies).     [provider]  montelukast (SINGULAIR) 10 MG tablet Take 10 mg by mouth every evening.     [provider]  omeprazole (PRILOSEC) 20 MG capsule  07/12/20   [provider]  oxybutynin (DITROPAN) 5 MG tablet  08/25/20   [provider]  oxyCODONE (OXY IR/ROXICODONE) 5 MG immediate release tablet Take 1-2 tablets (5-10 mg total) by mouth every 4 (four) hours as needed for moderate pain (pain score 4-6). 09/09/21   Duanne Guess, PA-C  sitaGLIPtin (JANUVIA) 50 MG tablet Take 50 mg by mouth daily.    [provider]  sodium chloride flush (NS) 0.9 % SOLN 10-40 mLs by Intracatheter route every 12 (twelve) hours. 09/09/21   Duanne Guess, PA-C  sodium chloride flush (NS) 0.9 % SOLN 10-40 mLs by Intracatheter route as needed (flush). 09/09/21   Duanne Guess, PA-C  spironolactone (ALDACTONE) 25 MG tablet Take 25 mg by mouth daily.     [provider]  tiotropium (SPIRIVA) 18 MCG inhalation capsule Place 18 mcg into inhaler and inhale daily.    [provider]  ULORIC 40 MG tablet Take 40 mg by mouth daily.  Patient not taking: Reported on 10/21/2021 04/15/16   [provider]  vitamin B-12 (CYANOCOBALAMIN) 1000 MCG tablet Take 1,000 mcg by mouth daily.    [provider]  vitamin C (ASCORBIC ACID) 500 MG tablet Take 500 mg by mouth daily.     [provider]      VITAL SIGNS:   Blood pressure (!) 171/84, pulse 79, temperature 98.1 F (36.7 C),  temperature source Oral, resp. rate 18, height 5\' 2"  (1.575 m), weight 75.3 kg, last menstrual period 05/28/1969, SpO2 97 %.  PHYSICAL EXAMINATION:  Physical Exam  GENERAL:  81 y.o.-year-old African-American female patient lying in the bed with no acute distress.  EYES: Pupils equal, round, reactive to light and accommodation. No scleral icterus. Extraocular muscles intact.  HEENT: Head atraumatic, normocephalic. Oropharynx and nasopharynx clear.  NECK:  Supple, no jugular venous distention. No thyroid enlargement, no tenderness.  LUNGS: Normal breath sounds bilaterally, no wheezing, rales,rhonchi or crepitation. No use of accessory muscles of respiration.  CARDIOVASCULAR: Regular rate and rhythm, S1, S2 normal. No murmurs, rubs, or gallops.  ABDOMEN: Soft, nondistended, nontender. Bowel sounds present. No organomegaly or mass.  EXTREMITIES: Right arm diffuse swelling with mild tenderness without induration or lichenification.  No leg edema, cyanosis, or clubbing.    NEUROLOGIC: Cranial nerves II through XII are intact. Muscle strength 5/5 in all extremities. Sensation intact. Gait not checked.  PSYCHIATRIC: The patient is alert and oriented x 3.  Normal affect and good eye contact. SKIN: No obvious rash, lesion, or ulcer.   LABORATORY PANEL:   CBC Recent Labs  Lab 10/21/21 1341  WBC 7.4  HGB 9.7*  HCT 29.5*  PLT 360   ------------------------------------------------------------------------------------------------------------------  Chemistries  Recent Labs  Lab 10/21/21 1341  NA 136  K 4.1  CL 108  CO2 21*  GLUCOSE 189*  BUN 38*  CREATININE 2.28*  CALCIUM 9.3  AST 26  ALT 22  ALKPHOS 134*  BILITOT 0.6   ------------------------------------------------------------------------------------------------------------------  Cardiac Enzymes No results for input(s): "TROPONINI" in the last 168  hours. ------------------------------------------------------------------------------------------------------------------  RADIOLOGY:  US Venous Img Upper Uni Right(DVT)  Result Date: 10/21/2021 CLINICAL DATA:  Right upper extremity swelling 6 days after PICC line EXAM: RIGHT UPPER EXTREMITY VENOUS DOPPLER ULTRASOUND TECHNIQUE: Gray-scale sonography with graded compression, as well as color Doppler and duplex ultrasound were performed to evaluate the upper extremity deep venous system from the level of the subclavian vein and including the jugular, axillary, basilic, radial, ulnar and upper cephalic vein. Spectral Doppler was utilized to evaluate flow at rest and with distal augmentation maneuvers. COMPARISON:  None Available. FINDINGS: Contralateral Subclavian Vein: Respiratory phasicity is normal and symmetric with the symptomatic side. No evidence of thrombus. Normal compressibility. Internal Jugular Vein: No evidence of thrombus. Normal compressibility, respiratory phasicity and response to augmentation. Subclavian Vein: Hypoechoic intraluminal thrombus. Thrombus appears occlusive. PICC line visualized. Axillary Vein: Similar acute appearing hypoechoic thrombus. Thrombus appears occlusive. Vessel noncompressible. Cephalic Vein: No evidence of thrombus. Normal compressibility, respiratory phasicity and response to augmentation. Basilic Vein: No evidence of thrombus. Normal compressibility, respiratory phasicity and response to augmentation. Brachial Veins: Mixed echogenicity thrombus. Vessel is partially compressible. Radial Veins: No evidence of thrombus. Normal compressibility, respiratory phasicity and response to augmentation. Ulnar Veins: No evidence of thrombus. Normal compressibility, respiratory phasicity and response to augmentation. IMPRESSION: Positive exam for acute appearing right upper extremity DVT involving the subclavian, axillary, and brachial veins. Electronically Signed   By: Jerilynn Mages.  Shick  M.D.   On: 10/21/2021 14:14      IMPRESSION AND PLAN:  Assessment and Plan: * Acute deep vein thrombosis (DVT) of right upper extremity ([place]) - The patient be admitted to a medical telemetry bed. - We will continue IV heparin. - Vascular surgery consult to be obtained. - Dr. Delana Meyer was notified about the patient. - Pain management will be provided.  GERD without esophagitis We will continue PPI therapy  Chronic obstructive pulmonary disease (COPD) (HCC) - We will continue her inhalers.  Essential hypertension - We will continue her antihypertensives..  Gout - We will continue allopurinol.  Diastolic dysfunction - We will continue Januvia and Aldactone.   DVT prophylaxis: Lovenox.   Advanced Care Planning:  Code Status: full code. Family Communication:  The plan of care was discussed in details with the patient (and family). I answered all questions. The patient agreed to proceed with the above mentioned plan. Further management will depend upon hospital course. Disposition Plan: Back to previous home environment Consults called: none. All the records are reviewed and case discussed with ED provider.  Status is: Inpatient   At the time of the admission, it appears that the appropriate admission status for this patient is inpatient.  This is judged to be reasonable and necessary in order to provide the required intensity of service to ensure the patient's safety given the presenting symptoms, physical exam findings and initial radiographic and laboratory data in the context of comorbid conditions.  The patient requires inpatient status due to high intensity of service, high risk of further deterioration and high frequency of surveillance required.  I certify that at the time of admission, it is my clinical judgment that the patient will require inpatient hospital care extending more than 2 midnights.                            Dispo: The patient is from: Home               Anticipated d/c is to: Home              Patient currently is not medically stable to d/c.              Difficult to place patient: No  Christel Mormon M.D on 10/21/2021 at 11:15 PM  [facility]   From 7 PM-7 AM, contact night-coverage [email or web address]  CC: Primary care physician; Idelle Crouch, MD

## 2021-10-21 NOTE — Progress Notes (Signed)
NAME: Mackenzie Hill  DOB: [birth year]  MRN: [id number]  Date/Time: 10/21/2021 10:57 AM  Subjective:   Is here with her niece Follow up visit Last seen on 09/25/21   Mackenzie Hill is a 81 y.o. female with a history of  DM, HTN, Gout, CKD rt hip replacement in 2018 was recently in the hospital with rt hip pain  She had Rt hip replacement in 2018 She always had pain after the sugery 5 years ago, but in June 2023 noted a dark tender spot on the surgical scar- Her PCP gave her levaquin X 10 days on 06/20/21 -wbc was normal  - She says the levaquin improved the pain some what but only to recur She is on allopurinol for gout and followed by rheumatologist who she saw on 06/23/21 She saw Dr.Menz , ortho on 07/07/21. He sent ESR/CRP  ESR was 60 and CRP N  On 08/07/21 she saw surgeon and he did not think there was any thigh abscess She had MRI on 08/20/21 and it showed Well-defined periprosthetic fluid collection along the lateral margin of the right femoral neck extends through the anterior compartment musculature of the proximal right thigh along its the expected site of prior surgical incision. Collection extends into the overlying subcutaneous soft tissues at the anterolateral aspect of the thigh. Although difficult to measure given the elongated, slightly serpiginous course of the collection, the collection measures approximately 11 cm in length. There is no obvious connection to the overlying skin surfac Pt returned to see Dr.Menz on 8/21/with worsening apin and rt hip was aspirated and showed 16K wbc with 90% N- crystals neg- dont see any culture She was taken to OR today  Dr.Menz's note says  :" Prior incision was opened at the bottom of the incision distally there is approximately 2 cm area of necrotic fat which was cultured.  Going deep to this the deep fascia was incised and the tensor muscle retracted posteriorly and the joint capsule exposed with again tissue cultures obtained when opening the  capsule fluid was also cultured from the joint fluid but it did not appear very inflammatory.  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 ( initially on day of discharge only the superficial cultures were positive and later the joint capsule and synovial fluid were positive as well)when  She was discharged home on IV ceftrixaone She had elevated LFTS thought to be due to ceftriaxone and it was dc on 09/26/21 and she had a few days off meds and a repeat test in 4 days showed normalization of the LFTS- she went on ertapenem ( with a couple of days of Po doxy) She c/o of swelling of rt arm for the past few days-  No pain No fever or chills She will complete 6 weeks of Iv antibiotic on 10/24/11 Past Medical History:  Diagnosis Date   Anemia    Anemia, unspecified    Hgb 11.8 - 04/2013   Arthritis    Cataract    Bilateral   CHF (congestive heart failure) ([place])    Chronic kidney disease    COPD (chronic obstructive pulmonary disease) ([employer])  DDD (degenerative disc disease), lumbar    Degenerative arthritis    Diabetes mellitus type 2, uncomplicated ([place])    AODM (A1C 6.9%) 04/2013   Diabetes mellitus without complication (HCC)    Dyspnea    with exertion   Esophagitis    Gout    Uric acid 5.4 - 08/2012   Hypertension    Obesity, unspecified    Sarcoidosis, lung ([place])    reported by pt, Clinically without a biopsy   Sleep apnea    OSA--Use C-PAP    Past Surgical History:  Procedure Laterality Date   ABDOMINAL HYSTERECTOMY     age 75   ANTERIOR HIP REVISION Right 09/04/2021   Procedure: ANTERIOR HIP REVISION;  Surgeon: Hessie Knows, MD;  Location:  [facility] ORS;  Service: Orthopedics;  Laterality: Right;   APPLICATION OF WOUND VAC Right 09/04/2021   Procedure: APPLICATION OF WOUND VAC;  Surgeon: Hessie Knows, MD;  Location: [facility] ORS;  Service: Orthopedics;  Laterality: Right;   CATARACT EXTRACTION Bilateral    COLONOSCOPY  11/02/2005   Hyperplastic Polyp: CBF 10/2015; Recall Ltr mailed 08/27/2015 (dw)   FOOT SURGERY Right    HALLUX VALGUS REPAIR Right    OOPHORECTOMY     TOTAL HIP ARTHROPLASTY 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   Procedure: TOTAL HIP ARTHROPLASTY ANTERIOR APPROACH;  Surgeon: Hessie Knows, MD;  Location: [facility] ORS;  Service: Orthopedics;  Laterality: Right;    Social History   Socioeconomic History   Marital status: Divorced    Spouse name: Not on file   Number of children: 1   Years of education: 2 years college   Highest education level: Not on file  Occupational History   Occupation: retired    Comment: worked at [employer]   Smoking status: Former    Packs/day: 0.70    Years: 3.00    Total pack years: 2.10    Types: Cigarettes    Start date: 01/12/1962    Quit date: 01/12/1966    Years since quitting: 55.8   Smokeless tobacco: Never  Vaping Use   Vaping Use: Never used  Substance and Sexual Activity   Alcohol use: No    Alcohol/week: 0.0 standard drinks of alcohol   Drug use: No   Sexual activity: Not on file  Other Topics Concern   Not on file  Social History Narrative   Not on file   Social Determinants of Health   Financial Resource Strain: Not on file  Food Insecurity: Not on file  Transportation Needs: Not on file  Physical Activity: Not on file  Stress: Not on file  Social Connections: Not on file  Intimate Partner Violence: Not on file    Family History  Problem Relation Age of Onset   Diabetes Mother        ovarian cancer   Hypertension Mother    Ovarian cancer Mother    Diabetes Father        hardening of arteries   Heart disease Father    Hypertension Father    Diabetes Sister     Heart disease Sister    Diabetes Brother    Heart disease Brother    Allergies  Allergen Reactions   Hydralazine Itching   Meloxicam Other (See Comments)    Causes excessive sleepiness fainting   Penicillins Hives, Itching and Other (See Comments)    Whelps Has patient had a PCN reaction causing immediate rash, facial/tongue/throat swelling, SOB or lightheadedness with hypotension: Yes Has patient had a  PCN reaction causing severe rash involving mucus membranes or skin necrosis: No Has patient had a PCN reaction that required hospitalization: No Has patient had a PCN reaction occurring within the last 10 years: No If all of the above answers are "NO", then may proceed with Cephalosporin use.    Pioglitazone Itching   Tramadol Other (See Comments)    Causes excessive sleepiness Dizzy/fainting   I? Current Outpatient Medications  Medication Sig Dispense Refill   diclofenac sodium (VOLTAREN) 1 % GEL Apply topically 2 (two) times daily as needed (pain).     diclofenac Sodium (VOLTAREN) 1 % GEL Apply 2 g topically 4 (four) times daily.     docusate sodium (COLACE) 100 MG capsule Take 1 capsule (100 mg total) by mouth 2 (two) times daily. 10 capsule 0   fexofenadine (ALLEGRA) 180 MG tablet Take 180 mg by mouth daily.      glipiZIDE (GLUCOTROL XL) 10 MG 24 hr tablet Take 10 mg by mouth 2 (two) times daily.      ipratropium-albuterol (DUONEB) 0.5-2.5 (3) MG/3ML SOLN Take 3 mLs by nebulization every 6 (six) hours as needed (shortness of breath or wheezing).     isosorbide mononitrate (IMDUR) 60 MG 24 hr tablet Take 180 mg by mouth daily.      levocetirizine (XYZAL) 5 MG tablet      losartan (COZAAR) 50 MG tablet Take 100 mg by mouth daily.      magnesium hydroxide (MILK OF MAGNESIA) 400 MG/5ML suspension Take 30 mLs by mouth every 4 (four) hours as needed. If constipation/ no BM for 2 days     mometasone (NASONEX) 50 MCG/ACT nasal spray Place 2 sprays into the nose daily as needed  (allergies).      montelukast (SINGULAIR) 10 MG tablet Take 10 mg by mouth every evening.      omeprazole (PRILOSEC) 20 MG capsule      oxybutynin (DITROPAN) 5 MG tablet      oxyCODONE (OXY IR/ROXICODONE) 5 MG immediate release tablet Take 1-2 tablets (5-10 mg total) by mouth every 4 (four) hours as needed for moderate pain (pain score 4-6). 30 tablet 0   sitaGLIPtin (JANUVIA) 50 MG tablet Take 50 mg by mouth daily.     sodium chloride flush (NS) 0.9 % SOLN 10-40 mLs by Intracatheter route every 12 (twelve) hours. 56 vial 0   sodium chloride flush (NS) 0.9 % SOLN 10-40 mLs by Intracatheter route as needed (flush). 30 vial 0   spironolactone (ALDACTONE) 25 MG tablet Take 25 mg by mouth daily.      tiotropium (SPIRIVA) 18 MCG inhalation capsule Place 18 mcg into inhaler and inhale daily.     vitamin B-12 (CYANOCOBALAMIN) 1000 MCG tablet Take 1,000 mcg by mouth daily.     vitamin C (ASCORBIC ACID) 500 MG tablet Take 500 mg by mouth daily.      albuterol (PROVENTIL HFA;VENTOLIN HFA) 108 (90 Base) MCG/ACT inhaler Inhale 2 puffs into the lungs every 6 (six) hours as needed for wheezing or shortness of breath. 1 Inhaler 0   allopurinol (ZYLOPRIM) 100 MG tablet Take by mouth.     amLODipine (NORVASC) 5 MG tablet Take 5 mg by mouth daily.      aspirin 81 MG EC tablet Take by mouth.     budesonide-formoterol (SYMBICORT) 160-4.5 MCG/ACT inhaler Inhale 2 puffs into the lungs 2 (two) times daily.      Calcium Carbonate-Vitamin D3 (CALCIUM 600/VITAMIN D) 600-400 MG-UNIT TABS Take 2 tablets by  mouth daily.     cetirizine (ZYRTEC) 10 MG tablet Take 10 mg by mouth at bedtime.      Chlorhexidine Gluconate Cloth 2 % PADS Apply 6 each topically daily. 30 each 0   doxycycline (ADOXA) 100 MG tablet Take 1 tablet (100 mg total) by mouth 2 (two) times daily. (Patient not taking: Reported on 10/21/2021) 20 tablet 0   enoxaparin (LOVENOX) 40 MG/0.4ML injection Inject 0.4 mLs (40 mg total) into the skin daily for 14 days.  (Patient not taking: Reported on 10/21/2021) 5.6 mL 0   methocarbamol (ROBAXIN) 500 MG tablet Take 1 tablet (500 mg total) by mouth every 6 (six) hours as needed for muscle spasms. (Patient not taking: Reported on 10/21/2021) 30 tablet 0   ULORIC 40 MG tablet Take 40 mg by mouth daily.  (Patient not taking: Reported on 10/21/2021)     No current facility-administered medications for this visit.     Abtx:  Anti-infectives (From admission, onward)    None       REVIEW OF SYSTEMS:  Const: negative fever, negative chills, negative weight loss Eyes: negative diplopia or visual changes, negative eye pain ENT: negative coryza, negative sore throat Resp: negative cough, hemoptysis, dyspnea Cards: negative for chest pain, palpitations, lower extremity edema GU: negative for frequency, dysuria and hematuria GI: Negative for abdominal pain, diarrhea, bleeding, constipation Skin: negative for rash and pruritus Heme: negative for easy bruising and gum/nose bleeding MS: rt hip pain better Neurolo:negative for headaches, dizziness, vertigo, memory problems  Psych: negative for feelings of anxiety, depression  Endocrine: negative for thyroid, diabetes Allergy/Immunology-PCN rash Not sure of amoxicillin Objective:  VITALS:  BP (!) 178/81   Pulse (!) 54   Temp (!) 97.1 F (36.2 C) ( LDA Rt PICC PHYSICAL EXAM:  General: Alert, cooperative, no distress, appears stated age.  Head: Normocephalic, without obvious abnormality, atraumatic. Eyes: Conjunctivae clear, anicteric sclerae. Pupils are equal ENT Nares normal. No drainage or sinus tenderness. Lips, mucosa, and tongue normal. No Thrush Neck: Supple, symmetrical, no adenopathy, thyroid: non tender no carotid bruit and no JVD. Back: No CVA tenderness. Lungs: Clear to auscultation bilaterally. No Wheezing or Rhonchi. No rales. Heart: Regular rate and rhythm, no murmur, rub or gallop. Abdomen: Soft, non-tender,not distended. Bowel sounds  normal. No masses Extremities:rt arm swollen    Rthip surgical site healed well    Able to take a few steps withput walker Lymph: Cervical, supraclavicular normal. Neurologic: Grossly non-focal Pertinent Labs Lab Results CBC    Component Value Date/Time   WBC 8.2 09/16/2021 1400   RBC 2.52 (L) 09/16/2021 1400   HGB 7.8 (L) 09/16/2021 1400   HGB 11.6 (L) 01/08/2014 1109   HCT 24.9 (L) 09/16/2021 1400   HCT 35.6 01/08/2014 1109   PLT 552 (H) 09/16/2021 1400   PLT 282 01/08/2014 1109   MCV 98.8 09/16/2021 1400   MCV 93 01/08/2014 1109   MCH 31.0 09/16/2021 1400   MCHC 31.3 09/16/2021 1400   RDW 14.8 09/16/2021 1400   RDW 14.0 01/08/2014 1109   LYMPHSABS 0.9 09/16/2021 1400   MONOABS 0.5 09/16/2021 1400   EOSABS 0.1 09/16/2021 1400   BASOSABS 0.1 09/16/2021 1400   09/23/21 HB 8.7 WBC 6 PLT 563 ESR 56 ( 100 on 09/16/21) CRP 10 ( range 0-10)  AST 286 ALT 315 Alkpo4 179 TB 0.3    Latest Ref Rng & Units 09/16/2021    2:00 PM 09/05/2021    3:27 AM 09/04/2021   11:13  AM  CMP  Glucose 70 - 99 mg/dL 225  168  159   BUN 8 - 23 mg/dL 20  30  35   Creatinine 0.44 - 1.00 mg/dL 1.72  1.73  1.84   Sodium 135 - 145 mmol/L 137  138  138   Potassium 3.5 - 5.1 mmol/L 4.7  4.6  4.0   Chloride 98 - 111 mmol/L 104  113  113   CO2 22 - 32 mmol/L _0 Calcium 8.9 - 10.3 mg/dL 9.1  8.8  9.2   Total Protein 6.5 - 8.1 g/dL 6.1   7.1   Total Bilirubin 0.3 - 1.2 mg/dL 0.3   0.7   Alkaline Phos 38 - 126 U/L 86   107   AST 15 - 41 U/L 23   26   ALT 0 - 44 U/L 13   19       Microbiology: Joint capsule Actinomyces  Superficial layer - actinomyces Middle layer soft tissue actinomyces  ? 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 She is on week 6  week of Iv antibiotic- initially wa son ceftriaxone and after 3 weeks the LFTS increased and hence it was changed to ertapenem on 9/18 . LFTS normalized She will complete on 10/12  She is now having swelling rt arm R/o DVT Will get doppler ultrasound'if thrombus then will remove PICC and stop Iv antibiotic PO antibiotic for 2 -4 weeks could be doxy or amoxicillin trial DM on januvia   HTN on amlodipine  Discussed the management with patient and her niece- ?  Pt sent for urgent doppler And result was acute thrombosis of axillary, brachial and subclavian vein on the rt side PT called and she will be going to ED Informed ED physician Informed vascular- ED to call them once patient is in ED PICC line will have to be removed To start her on PO doxy 150m BID

## 2021-10-21 NOTE — ED Provider Triage Note (Signed)
Emergency Medicine Provider Triage Evaluation Note  Mackenzie Hill , a 81 y.o. female  was evaluated in triage.  Pt complains of R UE blood clot.  Patient presents to the ED at the advice of her ID provider, after a ultrasound confirming a right subclavian blood clot was performed today.  Patient denies any chest pain, shortness of breath, fever, chills, or sweats.  Patient is status post total hip replacement on the right, with subsequent joint infection, and PICC line placement.  Review of Systems  Positive: RUE blood clot Negative: FCS, SOB  Physical Exam  LMP 05/28/1969 (Approximate) Comment: hysterectomy Gen:   Awake, no distress  NAD Resp:  Normal effort CTA MSK:   Moves extremities without difficulty  Other:    Medical Decision Making  Medically screening exam initiated at 3:41 PM.  Appropriate orders placed.  Mackenzie Hill was informed that the remainder of the evaluation will be completed by another provider, this initial triage assessment does not replace that evaluation, and the importance of remaining in the ED until their evaluation is complete.  Geriatric patient to the ED for evaluation of RUE subclavian blood clot found on outpatient ultrasound today.   Mackenzie Needles, PA-C 10/21/21 1544

## 2021-10-21 NOTE — Patient Instructions (Addendum)
You are here for follow up  of the rt hip infection- you have swelling of the rt arm and will do doppler to make sure no clot- tomorrow is the last day of the antibiotic- you will need to take amoxicillin after that.will touch base after the doppler

## 2021-10-21 NOTE — ED Triage Notes (Signed)
Pt sent to ER for eval for blood clot in the right shoulder/arm.  Pt has intermittent sob.  No chest pain.   Pt alert speech clear.

## 2021-10-21 NOTE — Assessment & Plan Note (Signed)
Continue Januvia and Aldactone.

## 2021-10-21 NOTE — Assessment & Plan Note (Addendum)
Patient had thrombectomy and angioplasty on 10/23/2021 by Dr. Delana Meyer.  Patient initially on heparin drip  and then converted over to Eliquis on 10/24/2021.

## 2021-10-21 NOTE — Progress Notes (Signed)
ANTICOAGULATION CONSULT NOTE - Initial Consult  Pharmacy Consult for Heparin Drip Indication: DVT  Allergies  Allergen Reactions   Hydralazine Itching   Meloxicam Other (See Comments)    Causes excessive sleepiness fainting   Penicillins Hives, Itching and Other (See Comments)    Whelps Has patient had a PCN reaction causing immediate rash, facial/tongue/throat swelling, SOB or lightheadedness with hypotension: Yes Has patient had a PCN reaction causing severe rash involving mucus membranes or skin necrosis: No Has patient had a PCN reaction that required hospitalization: No Has patient had a PCN reaction occurring within the last 10 years: No If all of the above answers are "NO", then may proceed with Cephalosporin use.    Pioglitazone Itching   Tramadol Other (See Comments)    Causes excessive sleepiness Dizzy/fainting    Patient Measurements: Height: 5\' 2"  (157.5 cm) Weight: 75.3 kg (166 lb) IBW/kg (Calculated) : 50.1 Heparin Dosing Weight: 66.4 kg  Vital Signs: Temp: 98.1 F (36.7 C) (10/10 1542) Temp Source: Oral (10/10 1542) BP: 171/84 (10/10 1542) Pulse Rate: 79 (10/10 1542)  Labs: Recent Labs    10/21/21 1341 10/21/21 2040  HGB 9.7*  --   HCT 29.5*  --   PLT 360  --   APTT  --  31  LABPROT  --  14.8  INR  --  1.2  CREATININE 2.28*  --     Estimated Creatinine Clearance: 18.7 mL/min (A) (by C-G formula based on SCr of 2.28 mg/dL (H)).   Medical History: Past Medical History:  Diagnosis Date   Anemia    Anemia, unspecified    Hgb 11.8 - 04/2013   Arthritis    Cataract    Bilateral   CHF (congestive heart failure) (H[facility])    Chronic kidney disease    COPD (chronic obstructive pulmonary disease) ([facility])    DDD (degenerative disc disease), lumbar    Degenerative arthritis    Diabetes mellitus type 2, uncomplicated ([facility])    AODM (A1C 6.9%) 04/2013   Diabetes mellitus without complication (HCC)    Dyspnea    with exertion   Esophagitis    Gout     Uric acid 5.4 - 08/2012   Hypertension    Obesity, unspecified    Sarcoidosis, lung ([place])    reported by pt, Clinically without a biopsy   Sleep apnea    OSA--Use C-PAP   Assessment: Patient is a 81yo female admitted for upper extremity DVT. Pharmacy consulted for Heparin dosing.  Goal of Therapy:  Heparin level 0.3-0.7 units/ml Monitor platelets by anticoagulation protocol: Yes   Plan:  Give 4500 units bolus x 1 Start heparin infusion at 1100 units/hr Check anti-Xa level in 8 hours and daily while on heparin Continue to monitor H&H and platelets  Paulina Fusi, PharmD, BCPS 10/21/2021 9:38 PM

## 2021-10-21 NOTE — ED Provider Notes (Addendum)
[facility] Provider Note    Event Date/Time   First MD Initiated Contact with Patient 10/21/21 1934     (approximate)   History   No chief complaint on file.   HPI  Mackenzie Hill is a 81 y.o. 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  Patient follows with Dr. Levester Fresh for an actinomyces infection of prosthetic hip.  She is currently getting antibiotics through a right upper extremity PICC line.  Follow-up in the office today and was noted to have right upper extremity swelling so was sent for Doppler ultrasound which revealed a thrombus in the subclavian axillary and brachial veins.  Patient notes that she has had swelling for about 6 days.  It is mildly painful.     Past Medical History:  Diagnosis Date   Anemia    Anemia, unspecified    Hgb 11.8 - 04/2013   Arthritis    Cataract    Bilateral   CHF (congestive heart failure) (HCC)    Chronic kidney disease    COPD (chronic obstructive pulmonary disease) (HCC)    DDD (degenerative disc disease), lumbar    Degenerative arthritis    Diabetes mellitus type 2, uncomplicated (HCC)    AODM (A1C 6.9%) 04/2013   Diabetes mellitus without complication (HCC)    Dyspnea    with exertion   Esophagitis    Gout    Uric acid 5.4 - 08/2012   Hypertension    Obesity, unspecified    Sarcoidosis, lung ([place])    reported by pt, Clinically without a biopsy   Sleep apnea    OSA--Use C-PAP    Patient Active Problem List   Diagnosis Date Noted   Infection of prosthetic total hip joint, initial encounter ([place]) 09/04/2021   Anemia 10/17/2020   Degenerative arthritis 10/17/2020   Esophagitis 10/17/2020   Gout 10/17/2020   Obesity 10/17/2020   Pain due to onychomycosis of toenails of both feet 10/17/2020   Benign hypertensive kidney disease with chronic kidney disease 03/14/2019    Chronic kidney disease, stage IV (severe) ([place]) 03/14/2019   Proteinuria 03/14/2019   Secondary hyperparathyroidism of renal origin ([place]) 03/14/2019   COPD (chronic obstructive pulmonary disease) ([place]) 08/11/2016   Primary localized osteoarthritis of right hip 07/21/2016   Colon cancer screening 12/17/2015   Chronic gouty arthropathy without tophi 04/10/2015   Arthralgia of multiple sites 02/27/2015   Great toe pain, left 02/27/2015   OSA on CPAP 90/24/0973   Diastolic dysfunction 53/29/9242   DOE (dyspnea on exertion) 02/20/2014   Diabetes ([place]) 06/19/2013   Essential (primary) hypertension 06/19/2013     Physical Exam  Triage Vital Signs: ED Triage Vitals  Enc Vitals Group     BP 10/21/21 1542 (!) 171/84     Pulse Rate 10/21/21 1542 79     Resp 10/21/21 1542 18     Temp 10/21/21 1542 98.1 F (36.7 C)     Temp Source 10/21/21 1542 Oral     SpO2 10/21/21 1542 97 %     Weight 10/21/21 1543 166 lb (75.3 kg)     Height 10/21/21 1543 5\' 2"  (1.575 m)     Head Circumference --      Peak Flow --      Pain Score 10/21/21 1543 0     Pain Loc --      Pain  Edu? --      Excl. in [place]? --     Most recent vital signs: Vitals:   10/21/21 1542  BP: (!) 171/84  Pulse: 79  Resp: 18  Temp: 98.1 F (36.7 C)  SpO2: 97%     General: Awake, no distress.  CV:  Good peripheral perfusion.  Resp:  Normal effort.  Abd:  No distention.  Neuro:             Awake, Alert, Oriented x 3  Other:  Right upper extremity with swelling but compartment is soft 2+ radial pulse PICC line in place in the right upper extremity   ED Results / Procedures / Treatments  Labs (all labs ordered are listed, but only abnormal results are displayed) Labs Reviewed  COMPREHENSIVE METABOLIC PANEL  CBC WITH DIFFERENTIAL/PLATELET  PROTIME-INR  APTT     EKG     RADIOLOGY    PROCEDURES:  Critical Care performed: No  Procedures {  MEDICATIONS ORDERED IN ED: Medications - No data to  display   IMPRESSION / MDM / [place] / ED COURSE  I reviewed the triage vital signs and the nursing notes.                              Patient's presentation is most consistent with acute presentation with potential threat to life or bodily function.  Differential diagnosis includes, but is not limited to, DVT, pulmonary embolism  The patient is an 81 year old female who currently has a PICC line in place as she was receiving antibiotic's for actinomyces infection of prosthetic hip presents because of an upper extremity DVT found on outpatient ultrasound.  ID ordered the ultrasound today after patient presented with right upper extremity swelling for about 6 days.  Ultrasound showed clot in the subclavian axillary and brachial veins.  On exam patient does have significant swelling in the right arm the compartments are soft she is neurovascular intact.  Discussed with Dr. Delana Meyer with vascular who recommends heparin and admission for thrombectomy tomorrow.  I have also discussed this with the patient and she is okay with staying.   I placed an order to have the PICC line removed.  We will discussed with the hospitalist.       FINAL CLINICAL IMPRESSION(S) / ED DIAGNOSES   Final diagnoses:  Acute deep vein thrombosis (DVT) of right upper extremity, unspecified vein ([place])     Rx / DC Orders   ED Discharge Orders     None        Note:  This document was prepared using Dragon voice recognition software and may include unintentional dictation errors.   Rada Hay, MD 10/21/21 2006    Rada Hay, MD 10/21/21 2006

## 2021-10-21 NOTE — Assessment & Plan Note (Signed)
continue allopurinol

## 2021-10-21 NOTE — Assessment & Plan Note (Signed)
-  continue PPI therapy

## 2021-10-21 NOTE — Assessment & Plan Note (Signed)
-   Continue her antihypertensives. 

## 2021-10-21 NOTE — Assessment & Plan Note (Signed)
Continue her inhalers.

## 2021-10-22 ENCOUNTER — Encounter
Admission: EM | Disposition: A | Discharge status: Home Health | Payer: Self-pay | Source: Home / Self Care | Attending: Internal Medicine

## 2021-10-22 DIAGNOSIS — E669 Obesity, unspecified: Secondary | ICD-10-CM

## 2021-10-22 DIAGNOSIS — I82621 Acute embolism and thrombosis of deep veins of right upper extremity: Secondary | ICD-10-CM | POA: Diagnosis not present

## 2021-10-22 DIAGNOSIS — T8451XD Infection and inflammatory reaction due to internal right hip prosthesis, subsequent encounter: Secondary | ICD-10-CM | POA: Diagnosis not present

## 2021-10-22 DIAGNOSIS — R001 Bradycardia, unspecified: Secondary | ICD-10-CM

## 2021-10-22 DIAGNOSIS — T8459XA Infection and inflammatory reaction due to other internal joint prosthesis, initial encounter: Secondary | ICD-10-CM

## 2021-10-22 DIAGNOSIS — I82A11 Acute embolism and thrombosis of right axillary vein: Secondary | ICD-10-CM | POA: Diagnosis not present

## 2021-10-22 DIAGNOSIS — G4733 Obstructive sleep apnea (adult) (pediatric): Secondary | ICD-10-CM

## 2021-10-22 DIAGNOSIS — N183 Chronic kidney disease, stage 3 unspecified: Secondary | ICD-10-CM

## 2021-10-22 DIAGNOSIS — Z6830 Body mass index (BMI) 30.0-30.9, adult: Secondary | ICD-10-CM

## 2021-10-22 DIAGNOSIS — Z96649 Presence of unspecified artificial hip joint: Secondary | ICD-10-CM

## 2021-10-22 DIAGNOSIS — J449 Chronic obstructive pulmonary disease, unspecified: Secondary | ICD-10-CM | POA: Diagnosis not present

## 2021-10-22 LAB — CBC WITH DIFFERENTIAL/PLATELET
Abs Immature Granulocytes: 0.02 10*3/uL (ref 0.00–0.07)
Basophils Absolute: 0.1 10*3/uL (ref 0.0–0.1)
Basophils Relative: 1 %
Eosinophils Absolute: 0.4 10*3/uL (ref 0.0–0.5)
Eosinophils Relative: 5 %
HCT: 29.4 % — ABNORMAL LOW (ref 36.0–46.0)
Hemoglobin: 9.4 g/dL — ABNORMAL LOW (ref 12.0–15.0)
Immature Granulocytes: 0 %
Lymphocytes Relative: 28 %
Lymphs Abs: 2.1 10*3/uL (ref 0.7–4.0)
MCH: 30.4 pg (ref 26.0–34.0)
MCHC: 32 g/dL (ref 30.0–36.0)
MCV: 95.1 fL (ref 80.0–100.0)
Monocytes Absolute: 0.5 10*3/uL (ref 0.1–1.0)
Monocytes Relative: 7 %
Neutro Abs: 4.4 10*3/uL (ref 1.7–7.7)
Neutrophils Relative %: 59 %
Platelets: 346 10*3/uL (ref 150–400)
RBC: 3.09 MIL/uL — ABNORMAL LOW (ref 3.87–5.11)
RDW: 13.5 % (ref 11.5–15.5)
WBC: 7.4 10*3/uL (ref 4.0–10.5)
nRBC: 0 % (ref 0.0–0.2)

## 2021-10-22 LAB — COMPREHENSIVE METABOLIC PANEL
ALT: 21 U/L (ref 0–44)
AST: 28 U/L (ref 15–41)
Albumin: 3.2 g/dL — ABNORMAL LOW (ref 3.5–5.0)
Alkaline Phosphatase: 118 U/L (ref 38–126)
Anion gap: 6 (ref 5–15)
BUN: 36 mg/dL — ABNORMAL HIGH (ref 8–23)
CO2: 23 mmol/L (ref 22–32)
Calcium: 9.2 mg/dL (ref 8.9–10.3)
Chloride: 112 mmol/L — ABNORMAL HIGH (ref 98–111)
Creatinine, Ser: 1.99 mg/dL — ABNORMAL HIGH (ref 0.44–1.00)
GFR, Estimated: 25 mL/min — ABNORMAL LOW (ref >60)
Glucose, Bld: 58 mg/dL — ABNORMAL LOW (ref 70–99)
Potassium: 3.6 mmol/L (ref 3.5–5.1)
Sodium: 141 mmol/L (ref 135–145)
Total Bilirubin: 0.5 mg/dL (ref 0.3–1.2)
Total Protein: 7.1 g/dL (ref 6.5–8.1)

## 2021-10-22 LAB — HEPARIN LEVEL (UNFRACTIONATED)
Heparin Unfractionated: 0.79 IU/mL — ABNORMAL HIGH (ref 0.30–0.70)
Heparin Unfractionated: 0.47 IU/mL (ref 0.30–0.70)

## 2021-10-22 LAB — HEMOGLOBIN A1C
Hgb A1c MFr Bld: 5.8 % — ABNORMAL HIGH (ref 4.8–5.6)
Mean Plasma Glucose: 119.76 mg/dL

## 2021-10-22 LAB — CBC
HCT: 26.6 % — ABNORMAL LOW (ref 36.0–46.0)
Hemoglobin: 8.7 g/dL — ABNORMAL LOW (ref 12.0–15.0)
MCH: 30.7 pg (ref 26.0–34.0)
MCHC: 32.7 g/dL (ref 30.0–36.0)
MCV: 94 fL (ref 80.0–100.0)
Platelets: 328 10*3/uL (ref 150–400)
RBC: 2.83 MIL/uL — ABNORMAL LOW (ref 3.87–5.11)
RDW: 13.4 % (ref 11.5–15.5)
WBC: 5 10*3/uL (ref 4.0–10.5)
nRBC: 0 % (ref 0.0–0.2)

## 2021-10-22 LAB — BASIC METABOLIC PANEL
Anion gap: 6 (ref 5–15)
BUN: 33 mg/dL — ABNORMAL HIGH (ref 8–23)
CO2: 19 mmol/L — ABNORMAL LOW (ref 22–32)
Calcium: 8.1 mg/dL — ABNORMAL LOW (ref 8.9–10.3)
Chloride: 114 mmol/L — ABNORMAL HIGH (ref 98–111)
Creatinine, Ser: 1.69 mg/dL — ABNORMAL HIGH (ref 0.44–1.00)
GFR, Estimated: 30 mL/min — ABNORMAL LOW (ref >60)
Glucose, Bld: 83 mg/dL (ref 70–99)
Potassium: 3.5 mmol/L (ref 3.5–5.1)
Sodium: 139 mmol/L (ref 135–145)

## 2021-10-22 LAB — CBG MONITORING, ED
Glucose-Capillary: 112 mg/dL — ABNORMAL HIGH (ref 70–99)
Glucose-Capillary: 63 mg/dL — ABNORMAL LOW (ref 70–99)
Glucose-Capillary: 119 mg/dL — ABNORMAL HIGH (ref 70–99)
Glucose-Capillary: 126 mg/dL — ABNORMAL HIGH (ref 70–99)
Glucose-Capillary: 237 mg/dL — ABNORMAL HIGH (ref 70–99)

## 2021-10-22 LAB — GLUCOSE, CAPILLARY: Glucose-Capillary: 87 mg/dL (ref 70–99)

## 2021-10-22 SURGERY — UPPER EXTREMITY VENOGRAPHY
Anesthesia: Moderate Sedation | Laterality: Left

## 2021-10-22 MED ORDER — INSULIN ASPART 100 UNIT/ML IJ SOLN
0.0000 [IU] | Freq: Three times a day (TID) | INTRAMUSCULAR | Status: DC
  Administered 2021-10-22: 1 [IU] via SUBCUTANEOUS
  Filled 2021-10-22 (×2): qty 1

## 2021-10-22 MED ORDER — INSULIN ASPART 100 UNIT/ML IJ SOLN: 0.0000 [IU] | Freq: Every day | INTRAMUSCULAR | Status: DC

## 2021-10-22 MED ORDER — SODIUM CHLORIDE 0.9 % IV SOLN
500.0000 mg | INTRAVENOUS | Status: DC
  Administered 2021-10-22: 500 mg via INTRAVENOUS
  Filled 2021-10-22 (×2): qty 0.5

## 2021-10-22 MED ORDER — CEFAZOLIN SODIUM-DEXTROSE 2-4 GM/100ML-% IV SOLN: 2.0000 g | INTRAVENOUS | Status: DC

## 2021-10-22 NOTE — Consult Note (Signed)
[place] [facility] Consult Note  MRN : [id number]  Mackenzie Hill is a 81 y.o. ([birth year]) female who presents with chief complaint of No chief complaint on file. .   Consulting Physician: Eugenie Norrie, MD Reason for consult: Upper extremity DVT History of Present Illness: Mackenzie Hill is an 81 year old female that present to [facility] for right upper extremity arm swelling.  She had an infected prosthesis and was managed with IV antibiotics via PICC line.  She is followed by infectious disease who noted that her right upper extremity was markedly swollen and non invasive studies revealed RUE DVT involving the subclavian, axillary and brachial veins.  Current Facility-Administered Medications  Medication Dose Route Frequency Provider Last Rate Last Admin   acetaminophen (TYLENOL) tablet 650 mg  650 mg Oral Q6H PRN Mansy, Jan A, MD       Or   acetaminophen (TYLENOL) suppository 650 mg  650 mg Rectal Q6H PRN Mansy, Arvella Merles, MD       albuterol (VENTOLIN HFA) 108 (90 Base) MCG/ACT inhaler 2 puff  2 puff Inhalation Q6H PRN Mansy, Jan A, MD       allopurinol (ZYLOPRIM) tablet 100 mg  100 mg Oral Daily Mansy, Jan A, MD   100 mg at 10/22/21 1005   amLODipine (NORVASC) tablet 5 mg  5 mg Oral Daily Mansy, Jan A, MD   5 mg at 10/22/21 1003   ascorbic acid (VITAMIN C) tablet 500 mg  500 mg Oral Daily Mansy, Jan A, MD   500 mg at 10/22/21 1003   aspirin EC tablet 81 mg  81 mg Oral Daily Mansy, Jan A, MD   81 mg at 10/22/21 1003   calcium-vitamin D (OSCAL WITH D) 500-5 MG-MCG per tablet 2 tablet  2 tablet Oral Daily Mansy, Jan A, MD   2 tablet at 10/22/21 1003   Chlorhexidine Gluconate Cloth 2 % PADS 6 each  6 each Topical Daily Mansy, Jan A, MD       cyanocobalamin (VITAMIN B12) tablet 1,000 mcg  1,000 mcg Oral Daily Mansy, Jan A, MD   1,000 mcg at 10/22/21 1040   diclofenac Sodium (VOLTAREN) 1 % topical gel 2 g  2 g Topical QID Mansy, Jan A, MD       docusate sodium (COLACE) capsule  100 mg  100 mg Oral BID Mansy, Jan A, MD   100 mg at 10/22/21 1003   ertapenem (INVANZ) 500 mg in sodium chloride 0.9 % 50 mL IVPB  500 mg Intravenous Q24H Tsosie Billing, MD   Stopped at 10/22/21 1111   fluticasone (FLONASE) 50 MCG/ACT nasal spray 2 spray  2 spray Each Nare Daily Mansy, Jan A, MD       heparin ADULT infusion 100 units/mL (25000 units/246mL)  1,050 Units/hr Intravenous Continuous Lorin Picket, RPH 10.5 mL/hr at 10/22/21 1533 1,050 Units/hr at 10/22/21 1533   insulin aspart (novoLOG) injection 0-5 Units  0-5 Units Subcutaneous QHS Wieting, Richard, MD       insulin aspart (novoLOG) injection 0-9 Units  0-9 Units Subcutaneous TID WC Wieting, Richard, MD       ipratropium-albuterol (DUONEB) 0.5-2.5 (3) MG/3ML nebulizer solution 3 mL  3 mL Nebulization Q6H PRN Mansy, Jan A, MD       isosorbide mononitrate (IMDUR) 24 hr tablet 180 mg  180 mg Oral Daily Mansy, Jan A, MD   180 mg at 10/22/21 1003   linagliptin (TRADJENTA) tablet 5 mg  5  mg Oral Daily Mansy, Jan A, MD   5 mg at 10/22/21 1004   loratadine (CLARITIN) tablet 10 mg  10 mg Oral Daily Mansy, Jan A, MD   10 mg at 10/22/21 1004   losartan (COZAAR) tablet 100 mg  100 mg Oral Daily Mansy, Jan A, MD   100 mg at 10/22/21 1004   mometasone-formoterol (DULERA) 200-5 MCG/ACT inhaler 2 puff  2 puff Inhalation BID Mansy, Jan A, MD       montelukast (SINGULAIR) tablet 10 mg  10 mg Oral QPM Mansy, Jan A, MD   10 mg at 10/22/21 1003   ondansetron (ZOFRAN) tablet 4 mg  4 mg Oral Q6H PRN Mansy, Jan A, MD       Or   ondansetron [facility]) injection 4 mg  4 mg Intravenous Q6H PRN Mansy, Jan A, MD       oxybutynin (DITROPAN) tablet 5 mg  5 mg Oral BID Mansy, Jan A, MD   5 mg at 10/22/21 1004   oxyCODONE (Oxy IR/ROXICODONE) immediate release tablet 5-10 mg  5-10 mg Oral Q4H PRN Mansy, Jan A, MD       polyethylene glycol (MIRALAX / GLYCOLAX) packet 17 g  17 g Oral Daily PRN Beers, Shanon Brow, RPH       sodium chloride flush (NS) 0.9 %  injection 10-40 mL  10-40 mL Intracatheter PRN Mansy, Jan A, MD       sodium chloride flush (NS) 0.9 % injection 10-40 mL  10-40 mL Intracatheter Q12H Mansy, Jan A, MD       spironolactone (ALDACTONE) tablet 25 mg  25 mg Oral Daily Mansy, Jan A, MD   25 mg at 10/22/21 1003   tiotropium (SPIRIVA) inhalation capsule ([facility] use ONLY) 18 mcg  18 mcg Inhalation Daily Mansy, Jan A, MD       traZODone (DESYREL) tablet 25 mg  25 mg Oral QHS PRN Mansy, Arvella Merles, MD       Current Outpatient Medications  Medication Sig Dispense Refill   allopurinol (ZYLOPRIM) 100 MG tablet Take by mouth.     amLODipine (NORVASC) 5 MG tablet Take 5 mg by mouth daily.      aspirin 81 MG EC tablet Take by mouth.     budesonide-formoterol (SYMBICORT) 160-4.5 MCG/ACT inhaler Inhale 2 puffs into the lungs 2 (two) times daily.      Calcium Carbonate-Vitamin D3 (CALCIUM 600/VITAMIN D) 600-400 MG-UNIT TABS Take 2 tablets by mouth daily.     cetirizine (ZYRTEC) 10 MG tablet Take 10 mg by mouth at bedtime.      Chlorhexidine Gluconate Cloth 2 % PADS Apply 6 each topically daily. 30 each 0   docusate sodium (COLACE) 100 MG capsule Take 1 capsule (100 mg total) by mouth 2 (two) times daily. 10 capsule 0   fexofenadine (ALLEGRA) 180 MG tablet Take 180 mg by mouth daily.      glipiZIDE (GLUCOTROL XL) 10 MG 24 hr tablet Take 10 mg by mouth 2 (two) times daily.      isosorbide mononitrate (IMDUR) 60 MG 24 hr tablet Take 180 mg by mouth daily.      losartan (COZAAR) 50 MG tablet Take 100 mg by mouth daily.      montelukast (SINGULAIR) 10 MG tablet Take 10 mg by mouth every evening.      omeprazole (PRILOSEC) 20 MG capsule      oxybutynin (DITROPAN) 5 MG tablet      sitaGLIPtin (JANUVIA) 50 MG tablet  Take 50 mg by mouth daily.     spironolactone (ALDACTONE) 25 MG tablet Take 25 mg by mouth daily.      tiotropium (SPIRIVA) 18 MCG inhalation capsule Place 18 mcg into inhaler and inhale daily.     vitamin B-12 (CYANOCOBALAMIN) 1000 MCG tablet  Take 1,000 mcg by mouth daily.     vitamin C (ASCORBIC ACID) 500 MG tablet Take 500 mg by mouth daily.      albuterol (PROVENTIL HFA;VENTOLIN HFA) 108 (90 Base) MCG/ACT inhaler Inhale 2 puffs into the lungs every 6 (six) hours as needed for wheezing or shortness of breath. 1 Inhaler 0   diclofenac sodium (VOLTAREN) 1 % GEL Apply topically 2 (two) times daily as needed (pain).     doxycycline (ADOXA) 100 MG tablet Take 1 tablet (100 mg total) by mouth 2 (two) times daily. (Patient not taking: Reported on 10/21/2021) 20 tablet 0   enoxaparin (LOVENOX) 40 MG/0.4ML injection Inject 0.4 mLs (40 mg total) into the skin daily for 14 days. (Patient not taking: Reported on 10/21/2021) 5.6 mL 0   ipratropium-albuterol (DUONEB) 0.5-2.5 (3) MG/3ML SOLN Take 3 mLs by nebulization every 6 (six) hours as needed (shortness of breath or wheezing).     levocetirizine (XYZAL) 5 MG tablet  (Patient not taking: Reported on 10/22/2021)     magnesium hydroxide (MILK OF MAGNESIA) 400 MG/5ML suspension Take 30 mLs by mouth every 4 (four) hours as needed. If constipation/ no BM for 2 days     methocarbamol (ROBAXIN) 500 MG tablet Take 1 tablet (500 mg total) by mouth every 6 (six) hours as needed for muscle spasms. (Patient not taking: Reported on 10/21/2021) 30 tablet 0   mometasone (NASONEX) 50 MCG/ACT nasal spray Place 2 sprays into the nose daily as needed (allergies).      oxyCODONE (OXY IR/ROXICODONE) 5 MG immediate release tablet Take 1-2 tablets (5-10 mg total) by mouth every 4 (four) hours as needed for moderate pain (pain score 4-6). 30 tablet 0   sodium chloride flush (NS) 0.9 % SOLN 10-40 mLs by Intracatheter route every 12 (twelve) hours. 56 vial 0   sodium chloride flush (NS) 0.9 % SOLN 10-40 mLs by Intracatheter route as needed (flush). 30 vial 0   ULORIC 40 MG tablet Take 40 mg by mouth daily.  (Patient not taking: Reported on 10/21/2021)      Past Medical History:  Diagnosis Date   Anemia    Anemia,  unspecified    Hgb 11.8 - 04/2013   Arthritis    Cataract    Bilateral   CHF (congestive heart failure) (HCC)    Chronic kidney disease    COPD (chronic obstructive pulmonary disease) (HCC)    DDD (degenerative disc disease), lumbar    Degenerative arthritis    Diabetes mellitus type 2, uncomplicated (HCC)    AODM (A1C 6.9%) 04/2013   Diabetes mellitus without complication (HCC)    Dyspnea    with exertion   Esophagitis    Gout    Uric acid 5.4 - 08/2012   Hypertension    Obesity, unspecified    Sarcoidosis, lung ([place])    reported by pt, Clinically without a biopsy   Sleep apnea    OSA--Use C-PAP    Past Surgical History:  Procedure Laterality Date   ABDOMINAL HYSTERECTOMY     age 34   ANTERIOR HIP REVISION Right 09/04/2021   Procedure: ANTERIOR HIP REVISION;  Surgeon: Hessie Knows, MD;  Location: [facility] ORS;  Service: Orthopedics;  Laterality: Right;   APPLICATION OF WOUND VAC Right 09/04/2021   Procedure: APPLICATION OF WOUND VAC;  Surgeon: Hessie Knows, MD;  Location: [facility] ORS;  Service: Orthopedics;  Laterality: Right;   CATARACT EXTRACTION Bilateral    COLONOSCOPY  11/02/2005   Hyperplastic Polyp: CBF 10/2015; Recall Ltr mailed 08/27/2015 (dw)   FOOT SURGERY Right    HALLUX VALGUS REPAIR Right    OOPHORECTOMY     TOTAL HIP ARTHROPLASTY 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   Procedure: TOTAL HIP ARTHROPLASTY ANTERIOR APPROACH;  Surgeon: Hessie Knows, MD;  Location: [facility] ORS;  Service: Orthopedics;  Laterality: Right;    Social History Social History   Tobacco Use   Smoking status: Former    Packs/day: 0.70    Years: 3.00    Total pack years: 2.10    Types: Cigarettes    Start date: 01/12/1962    Quit date: 01/12/1966    Years since quitting: 55.8   Smokeless tobacco: Never  Vaping Use   Vaping Use: Never used  Substance Use Topics   Alcohol use: No    Alcohol/week: 0.0 standard drinks of alcohol   Drug use: No    Family History Family History  Problem Relation Age of Onset    Diabetes Mother        ovarian cancer   Hypertension Mother    Ovarian cancer Mother    Diabetes Father        hardening of arteries   Heart disease Father    Hypertension Father    Diabetes Sister    Heart disease Sister    Diabetes Brother    Heart disease Brother     Allergies  Allergen Reactions   Hydralazine Itching   Meloxicam Other (See Comments)    Causes excessive sleepiness fainting   Penicillins Hives, Itching and Other (See Comments)    [place] Has patient had a PCN reaction causing immediate rash, facial/tongue/throat swelling, SOB or lightheadedness with hypotension: Yes Has patient had a PCN reaction causing severe rash involving mucus membranes or skin necrosis: No Has patient had a PCN reaction that required hospitalization: No Has patient had a PCN reaction occurring within the last 10 years: No If all of the above answers are "NO", then may proceed with Cephalosporin use.    Pioglitazone Itching   Tramadol Other (See Comments)    Causes excessive sleepiness Dizzy/fainting     REVIEW OF SYSTEMS (Negative unless checked)  Constitutional: [] Weight loss  [] Fever  [] Chills Cardiac: [] Chest pain   [] Chest pressure   [] Palpitations   [] Shortness of breath when laying flat   [] Shortness of breath at rest   [] Shortness of breath with exertion. Vascular:  [] Pain in legs with walking   [] Pain in legs at rest   [] Pain in legs when laying flat   [] Claudication   [] Pain in feet when walking  [] Pain in feet at rest  [] Pain in feet when laying flat   [x] History of DVT   [] Phlebitis   [] Swelling in legs   [] Varicose veins   [] Non-healing ulcers Pulmonary:   [] Uses home oxygen   [] Productive cough   [] Hemoptysis   [] Wheeze  [] COPD   [] Asthma Neurologic:  [] Dizziness  [] Blackouts   [] Seizures   [] History of stroke   [] History of TIA  [] Aphasia   [] Temporary blindness   [] Dysphagia   [] Weakness or numbness in arms   [] Weakness or numbness in legs Musculoskeletal:   [] Arthritis   [] Joint swelling   [] Joint pain   []   Low back pain Hematologic:  [] Easy bruising  [] Easy bleeding   [] Hypercoagulable state   [] Anemic  [] Hepatitis Gastrointestinal:  [] Blood in stool   [] Vomiting blood  [] Gastroesophageal reflux/heartburn   [] Difficulty swallowing. Genitourinary:  [] Chronic kidney disease   [] Difficult urination  [] Frequent urination  [] Burning with urination   [] Blood in urine Skin:  [] Rashes   [] Ulcers   [] Wounds Psychological:  [] History of anxiety   []  History of major depression.  Physical Examination  Vitals:   10/22/21 1040 10/22/21 1044 10/22/21 1045 10/22/21 1200  BP:   (!) 153/85 (!) 154/64  Pulse: 62 (!) 40 (!) 56 63  Resp: (!) 25 16 18 18   Temp:    98.3 F (36.8 C)  TempSrc:    Oral  SpO2:    95%  Weight:      Height:       Body mass index is 30.36 kg/m. Gen:  WD/WN, NAD Head: [place]/AT, No temporalis wasting. Prominent temp pulse not noted. Ear/Nose/Throat: Hearing grossly intact, nares w/o erythema or drainage, oropharynx w/o Erythema/Exudate Eyes: Sclera non-icteric, conjunctiva clear Neck: Trachea midline.  No JVD.  Pulmonary:  Good air movement, respirations not labored, equal bilaterally.  Cardiac: RRR, normal S1, S2. Vascular: right arm swelling Vessel Right Left  Radial Palpable Palpable   Gastrointestinal: soft, non-tender/non-distended. No guarding/reflex.  Musculoskeletal: M/S 5/5 throughout.  Extremities without ischemic changes.  No deformity or atrophy. RUE edema  Neurologic: Sensation grossly intact in extremities.  Symmetrical.  Speech is fluent. Motor exam as listed above. Psychiatric: Judgment intact, Mood & affect appropriate for pt's clinical situation. Dermatologic: No rashes or ulcers noted.  No cellulitis or open wounds. Lymph : No Cervical, Axillary, or Inguinal lymphadenopathy.    CBC Lab Results  Component Value Date   WBC 5.0 10/22/2021   HGB 8.7 (L) 10/22/2021   HCT 26.6 (L) 10/22/2021   MCV 94.0  10/22/2021   PLT 328 10/22/2021    BMET    Component Value Date/Time   NA 139 10/22/2021 0703   NA 138 01/08/2014 1109   K 3.5 10/22/2021 0703   K 3.8 01/08/2014 1109   CL 114 (H) 10/22/2021 0703   CL 103 01/08/2014 1109   CO2 19 (L) 10/22/2021 0703   CO2 27 01/08/2014 1109   GLUCOSE 83 10/22/2021 0703   GLUCOSE 304 (H) 01/08/2014 1109   BUN 33 (H) 10/22/2021 0703   BUN 29 (H) 01/08/2014 1109   CREATININE 1.69 (H) 10/22/2021 0703   CREATININE 1.66 (H) 01/08/2014 1109   CALCIUM 8.1 (L) 10/22/2021 0703   CALCIUM 8.3 (L) 01/08/2014 1109   GFRNONAA 30 (L) 10/22/2021 0703   GFRNONAA 32 (L) 01/08/2014 1109   GFRAA 26 (L) 07/23/2016 0334   GFRAA 39 (L) 01/08/2014 1109   Estimated Creatinine Clearance: 25.2 mL/min (A) (by C-G formula based on SCr of 1.69 mg/dL (H)).  COAG Lab Results  Component Value Date   INR 1.2 10/21/2021   INR 0.99 07/07/2016    Radiology US Venous Img Upper Uni Right(DVT)  Result Date: 10/21/2021 CLINICAL DATA:  Right upper extremity swelling 6 days after PICC line EXAM: RIGHT UPPER EXTREMITY VENOUS DOPPLER ULTRASOUND TECHNIQUE: Gray-scale sonography with graded compression, as well as color Doppler and duplex ultrasound were performed to evaluate the upper extremity deep venous system from the level of the subclavian vein and including the jugular, axillary, basilic, radial, ulnar and upper cephalic vein. Spectral Doppler was utilized to evaluate flow at rest and with distal augmentation  maneuvers. COMPARISON:  None Available. FINDINGS: Contralateral Subclavian Vein: Respiratory phasicity is normal and symmetric with the symptomatic side. No evidence of thrombus. Normal compressibility. Internal Jugular Vein: No evidence of thrombus. Normal compressibility, respiratory phasicity and response to augmentation. Subclavian Vein: Hypoechoic intraluminal thrombus. Thrombus appears occlusive. PICC line visualized. Axillary Vein: Similar acute appearing hypoechoic  thrombus. Thrombus appears occlusive. Vessel noncompressible. Cephalic Vein: No evidence of thrombus. Normal compressibility, respiratory phasicity and response to augmentation. Basilic Vein: No evidence of thrombus. Normal compressibility, respiratory phasicity and response to augmentation. Brachial Veins: Mixed echogenicity thrombus. Vessel is partially compressible. Radial Veins: No evidence of thrombus. Normal compressibility, respiratory phasicity and response to augmentation. Ulnar Veins: No evidence of thrombus. Normal compressibility, respiratory phasicity and response to augmentation. IMPRESSION: Positive exam for acute appearing right upper extremity DVT involving the subclavian, axillary, and brachial veins. Electronically Signed   By: Jerilynn Mages.  Shick M.D.   On: 10/21/2021 14:14   [place] EKG SITE RITE  Result Date: 10/09/2021 If Site Rite image not attached, placement could not be confirmed due to current cardiac rhythm.     Assessment/Plan 1. Upper Extremity DVT  The patient has an extensive upper extremity DVT as a likely consequence of her PICC line.  Due to the pain and extensive swelling, we recommend the patient undergo thrombectomy.  The risks, benefits, and alternatives were explained and the patient is agreeable to proceed.  Her PICC should be removed and transitioned to oral antibiotic per Infectious Disease.  Recommend Heparin gtt. Originally planned to do thrombectomy today however patient was not NPO, will plan for tomorrow   Plan of care discussed with Dr. Delana Meyer and he is in agreement with plan noted above.   Family Communication:  Total Time: 75 minutes I spent 75 minutes in this encounter including personally reviewing extensive medical records, personally reviewing imaging studies and compared to prior scans, counseling the patient, placing orders, coordinating care and performing appropriate documentation  Thank you for allowing [place] to participate in the care of this  patient.   Kris Hartmann, NP [facility] [phone] (Office Phone) [phone] (Office Fax) [phone] (Pager)  10/22/2021 3:57 PM  Staff may message me via secure chat in [place]  but this may not receive immediate response,  please page for urgent matters!  Dictation software was used to generate the above note. Typos may occur and escape review, as with typed/written notes. Any error is purely unintentional.  Please contact me directly for clarity if needed.

## 2021-10-22 NOTE — Progress Notes (Signed)
  Progress Note   Patient: Mackenzie Hill BTD:[id number] DOB: [birth year] DOA: 10/21/2021     1 DOS: the patient was seen and examined on 10/22/2021     Assessment and Plan: * Acute deep vein thrombosis (DVT) of right upper extremity ([place]) - Case discussed with Dr. Delana Meyer vascular surgery.  Since the patient ate breakfast this morning, thrombectomy will be scheduled for Friday. - Continue IV heparin and removal of PICC line today.  Sinus bradycardia Patient not on any rate controlling medications.  Continue to monitor on telemetry.   Infection of prosthetic total hip joint, initial encounter [facility]) Case discussed with infectious disease specialist.  We will give IV ertapenem for couple more doses and will have test dose of amoxicillin when she gets up to the floor.  Previous actinomyces growing out of culture.  Essential hypertension Continue her antihypertensives..  Diastolic dysfunction Continue Januvia and Aldactone.  Chronic obstructive pulmonary disease (COPD) (HCC) Continue her inhalers.  Obesity With current height and weight in computer BMI 30.36.  GERD without esophagitis continue PPI therapy  Gout continue allopurinol.  CKD (chronic kidney disease), stage III (HCC) CKD stage IIIb.  Creatinine 1.69 with a GFR of 30.  OSA on CPAP CPAP ordered today.  Anemia Anemia unspecified.  Stop IV fluids.  Hemoglobin 8.7.  We we will check iron studies to further classify anemia.        Subjective: Patient sent in for swelling of her arm.  She was found to have a DVT right arm.  Has nursing staff to remove PICC line.  Started on heparin drip.  Physical Exam: Vitals:   10/22/21 1040 10/22/21 1044 10/22/21 1045 10/22/21 1200  BP:   (!) 153/85 (!) 154/64  Pulse: 62 (!) 40 (!) 56 63  Resp: (!) 25 16 18 18   Temp:    98.3 F (36.8 C)  TempSrc:    Oral  SpO2:    95%  Weight:      Height:       Physical Exam HENT:     Head: Normocephalic.     Mouth/Throat:      Pharynx: No oropharyngeal exudate.  Eyes:     General: Lids are normal.     Conjunctiva/sclera: Conjunctivae normal.  Cardiovascular:     Rate and Rhythm: Normal rate and regular rhythm.     Heart sounds: Normal heart sounds, S1 normal and S2 normal.  Pulmonary:     Breath sounds: No decreased breath sounds, wheezing, rhonchi or rales.  Abdominal:     Palpations: Abdomen is soft.     Tenderness: There is no abdominal tenderness.  Musculoskeletal:     Right lower leg: Swelling present.     Left lower leg: Swelling present.  Skin:    General: Skin is warm.     Findings: No rash.  Neurological:     Mental Status: She is alert and oriented to person, place, and time.     Data Reviewed: Creatinine 1.69, white blood cell count 5.0, platelet count 328, hemoglobin 8.7   Disposition: Status is: Inpatient Remains inpatient appropriate because: Vascular surgery procedure for Friday.  Planned Discharge Destination: Home    Time spent: 28 minutes  Author: Loletha Grayer, MD 10/22/2021 1:07 PM  For on call review [email or web address].

## 2021-10-22 NOTE — Consult Note (Signed)
NAME: Mackenzie Hill  DOB: 1940-05-11  MRN: [id number]  Date/Time: 10/22/2021 7:56 PM  REQUESTING PROVIDER: Dr.Wieting Subjective:  REASON FOR CONSULT: PJI ? Mackenzie Hill is a 81 y.o. 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  Temp 98.1 F (36.7 C)  Pulse Rate 79  Resp 18  SpO2 97 %   Labs revealed  Latest Reference Range & Units 10/21/21  WBC 4.0 - 10.5 K/uL 7.4  Hemoglobin 12.0 - 15.0 g/dL 9.7 (L)  HCT 36.0 - 46.0 % 29.5 (L)  Platelets 150 - 400 K/uL 360  Creatinine 0.44 - 1.00 mg/dL 2.28 (H)   PT started on IV heparin infusion PICC was removed from the rt arm this morning Seen by vascular and planning for thrombectomy Pt is feeling better  Swelling of rt hand improving    Past Medical History:  Diagnosis Date   Anemia    Anemia, unspecified    Hgb 11.8 - 04/2013   Arthritis    Cataract    Bilateral   CHF (congestive heart failure) (HCC)    Chronic kidney disease    COPD (chronic obstructive pulmonary disease) (HCC)    DDD (degenerative disc disease), lumbar    Degenerative arthritis    Diabetes mellitus type 2, uncomplicated (HCC)    AODM (A1C 6.9%) 04/2013   Diabetes mellitus without complication (HCC)    Dyspnea    with exertion   Esophagitis    Gout    Uric acid 5.4 - 08/2012   Hypertension    Obesity, unspecified    Sarcoidosis, lung ([place])    reported by pt, Clinically without a biopsy   Sleep apnea    OSA--Use C-PAP    Past Surgical History:  Procedure Laterality Date   ABDOMINAL HYSTERECTOMY     age 19   ANTERIOR HIP REVISION Right 09/04/2021   Procedure: ANTERIOR HIP REVISION;  Surgeon: Hessie Knows, MD;  Location: [facility] ORS;  Service: Orthopedics;   Laterality: Right;   APPLICATION OF WOUND VAC Right 09/04/2021   Procedure: APPLICATION OF WOUND VAC;  Surgeon: Hessie Knows, MD;  Location: [facility] ORS;  Service: Orthopedics;  Laterality: Right;   CATARACT EXTRACTION Bilateral    COLONOSCOPY  11/02/2005   Hyperplastic Polyp: CBF 10/2015; Recall Ltr mailed 08/27/2015 (dw)   FOOT SURGERY Right    HALLUX VALGUS REPAIR Right    OOPHORECTOMY     TOTAL HIP ARTHROPLASTY 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   Procedure: TOTAL HIP ARTHROPLASTY ANTERIOR APPROACH;  Surgeon: Hessie Knows, MD;  Location: [facility] ORS;  Service: Orthopedics;  Laterality: Right;    Social History   Socioeconomic History   Marital status: Divorced    Spouse name: Not on file   Number of children: 1   Years of education: 2 years college   Highest education level: Not on file  Occupational History   Occupation: retired    Comment: worked at [employer]   Smoking status: Former    Packs/day: 0.70    Years: 3.00    Total pack years: 2.10    Types: Cigarettes    Start date: 01/12/1962  Quit date: 01/12/1966    Years since quitting: 55.8   Smokeless tobacco: Never  Vaping Use   Vaping Use: Never used  Substance and Sexual Activity   Alcohol use: No    Alcohol/week: 0.0 standard drinks of alcohol   Drug use: No   Sexual activity: Not on file  Other Topics Concern   Not on file  Social History Narrative   Not on file   Social Determinants of Health   Financial Resource Strain: Not on file  Food Insecurity: Not on file  Transportation Needs: Not on file  Physical Activity: Not on file  Stress: Not on file  Social Connections: Not on file  Intimate Partner Violence: Not on file    Family History  Problem Relation Age of Onset   Diabetes Mother        ovarian cancer   Hypertension Mother    Ovarian cancer Mother    Diabetes Father        hardening of arteries   Heart disease Father    Hypertension Father    Diabetes Sister    Heart disease Sister    Diabetes  Brother    Heart disease Brother    Allergies  Allergen Reactions   Hydralazine Itching   Meloxicam Other (See Comments)    Causes excessive sleepiness fainting   Penicillins Hives, Itching and Other (See Comments)    Whelps Has patient had a PCN reaction causing immediate rash, facial/tongue/throat swelling, SOB or lightheadedness with hypotension: Yes Has patient had a PCN reaction causing severe rash involving mucus membranes or skin necrosis: No Has patient had a PCN reaction that required hospitalization: No Has patient had a PCN reaction occurring within the last 10 years: No If all of the above answers are "NO", then may proceed with Cephalosporin use.    Pioglitazone Itching   Tramadol Other (See Comments)    Causes excessive sleepiness Dizzy/fainting   I? Current Facility-Administered Medications  Medication Dose Route Frequency Provider Last Rate Last Admin   acetaminophen (TYLENOL) tablet 650 mg  650 mg Oral Q6H PRN Mansy, Jan A, MD       Or   acetaminophen (TYLENOL) suppository 650 mg  650 mg Rectal Q6H PRN Mansy, Arvella Merles, MD       albuterol (VENTOLIN HFA) 108 (90 Base) MCG/ACT inhaler 2 puff  2 puff Inhalation Q6H PRN Mansy, Jan A, MD       allopurinol (ZYLOPRIM) tablet 100 mg  100 mg Oral Daily Mansy, Jan A, MD   100 mg at 10/22/21 1005   amLODipine (NORVASC) tablet 5 mg  5 mg Oral Daily Mansy, Jan A, MD   5 mg at 10/22/21 1003   ascorbic acid (VITAMIN C) tablet 500 mg  500 mg Oral Daily Mansy, Jan A, MD   500 mg at 10/22/21 1003   aspirin EC tablet 81 mg  81 mg Oral Daily Mansy, Jan A, MD   81 mg at 10/22/21 1003   calcium-vitamin D (OSCAL WITH D) 500-5 MG-MCG per tablet 2 tablet  2 tablet Oral Daily Mansy, Jan A, MD   2 tablet at 10/22/21 1003   [START ON 10/23/2021] ceFAZolin (ANCEF) IVPB 2g/100 mL premix  2 g Intravenous On Call to OR Schnier, Dolores Lory, MD       Chlorhexidine Gluconate Cloth 2 % PADS 6 each  6 each Topical Daily Mansy, Jan A, MD        cyanocobalamin (VITAMIN B12) tablet 1,000 mcg  1,000 mcg Oral Daily Mansy, Jan A, MD   1,000 mcg at 10/22/21 1040   diclofenac Sodium (VOLTAREN) 1 % topical gel 2 g  2 g Topical QID Mansy, Jan A, MD       docusate sodium (COLACE) capsule 100 mg  100 mg Oral BID Mansy, Jan A, MD   100 mg at 10/22/21 1003   ertapenem (INVANZ) 500 mg in sodium chloride 0.9 % 50 mL IVPB  500 mg Intravenous Q24H Tsosie Billing, MD   Stopped at 10/22/21 1111   fluticasone (FLONASE) 50 MCG/ACT nasal spray 2 spray  2 spray Each Nare Daily Mansy, Jan A, MD       heparin ADULT infusion 100 units/mL (25000 units/242mL)  1,050 Units/hr Intravenous Continuous Lorin Picket, RPH 10.5 mL/hr at 10/22/21 1533 1,050 Units/hr at 10/22/21 1533   insulin aspart (novoLOG) injection 0-5 Units  0-5 Units Subcutaneous QHS Wieting, Richard, MD       insulin aspart (novoLOG) injection 0-9 Units  0-9 Units Subcutaneous TID WC Loletha Grayer, MD   1 Units at 10/22/21 1952   ipratropium-albuterol (DUONEB) 0.5-2.5 (3) MG/3ML nebulizer solution 3 mL  3 mL Nebulization Q6H PRN Mansy, Jan A, MD       isosorbide mononitrate (IMDUR) 24 hr tablet 180 mg  180 mg Oral Daily Mansy, Jan A, MD   180 mg at 10/22/21 1003   linagliptin (TRADJENTA) tablet 5 mg  5 mg Oral Daily Mansy, Jan A, MD   5 mg at 10/22/21 1004   loratadine (CLARITIN) tablet 10 mg  10 mg Oral Daily Mansy, Jan A, MD   10 mg at 10/22/21 1004   losartan (COZAAR) tablet 100 mg  100 mg Oral Daily Mansy, Jan A, MD   100 mg at 10/22/21 1004   mometasone-formoterol (DULERA) 200-5 MCG/ACT inhaler 2 puff  2 puff Inhalation BID Mansy, Jan A, MD       montelukast (SINGULAIR) tablet 10 mg  10 mg Oral QPM Mansy, Jan A, MD   10 mg at 10/22/21 1003   ondansetron (ZOFRAN) tablet 4 mg  4 mg Oral Q6H PRN Mansy, Jan A, MD       Or   ondansetron [facility]) injection 4 mg  4 mg Intravenous Q6H PRN Mansy, Jan A, MD       oxybutynin (DITROPAN) tablet 5 mg  5 mg Oral BID Mansy, Jan A, MD   5 mg at  10/22/21 1004   oxyCODONE (Oxy IR/ROXICODONE) immediate release tablet 5-10 mg  5-10 mg Oral Q4H PRN Mansy, Jan A, MD       polyethylene glycol (MIRALAX / GLYCOLAX) packet 17 g  17 g Oral Daily PRN Beers, Shanon Brow, RPH       sodium chloride flush (NS) 0.9 % injection 10-40 mL  10-40 mL Intracatheter PRN Mansy, Jan A, MD       sodium chloride flush (NS) 0.9 % injection 10-40 mL  10-40 mL Intracatheter Q12H Mansy, Jan A, MD       spironolactone (ALDACTONE) tablet 25 mg  25 mg Oral Daily Mansy, Jan A, MD   25 mg at 10/22/21 1003   tiotropium (SPIRIVA) inhalation capsule ([facility] use ONLY) 18 mcg  18 mcg Inhalation Daily Mansy, Jan A, MD       traZODone (DESYREL) tablet 25 mg  25 mg Oral QHS PRN Mansy, Arvella Merles, MD       Current Outpatient Medications  Medication Sig Dispense Refill   allopurinol (ZYLOPRIM) 100  MG tablet Take by mouth.     amLODipine (NORVASC) 5 MG tablet Take 5 mg by mouth daily.      aspirin 81 MG EC tablet Take by mouth.     budesonide-formoterol (SYMBICORT) 160-4.5 MCG/ACT inhaler Inhale 2 puffs into the lungs 2 (two) times daily.      Calcium Carbonate-Vitamin D3 (CALCIUM 600/VITAMIN D) 600-400 MG-UNIT TABS Take 2 tablets by mouth daily.     cetirizine (ZYRTEC) 10 MG tablet Take 10 mg by mouth at bedtime.      Chlorhexidine Gluconate Cloth 2 % PADS Apply 6 each topically daily. 30 each 0   docusate sodium (COLACE) 100 MG capsule Take 1 capsule (100 mg total) by mouth 2 (two) times daily. 10 capsule 0   fexofenadine (ALLEGRA) 180 MG tablet Take 180 mg by mouth daily.      glipiZIDE (GLUCOTROL XL) 10 MG 24 hr tablet Take 10 mg by mouth 2 (two) times daily.      isosorbide mononitrate (IMDUR) 60 MG 24 hr tablet Take 180 mg by mouth daily.      losartan (COZAAR) 50 MG tablet Take 100 mg by mouth daily.      montelukast (SINGULAIR) 10 MG tablet Take 10 mg by mouth every evening.      omeprazole (PRILOSEC) 20 MG capsule      oxybutynin (DITROPAN) 5 MG tablet      sitaGLIPtin  (JANUVIA) 50 MG tablet Take 50 mg by mouth daily.     spironolactone (ALDACTONE) 25 MG tablet Take 25 mg by mouth daily.      tiotropium (SPIRIVA) 18 MCG inhalation capsule Place 18 mcg into inhaler and inhale daily.     vitamin B-12 (CYANOCOBALAMIN) 1000 MCG tablet Take 1,000 mcg by mouth daily.     vitamin C (ASCORBIC ACID) 500 MG tablet Take 500 mg by mouth daily.      albuterol (PROVENTIL HFA;VENTOLIN HFA) 108 (90 Base) MCG/ACT inhaler Inhale 2 puffs into the lungs every 6 (six) hours as needed for wheezing or shortness of breath. 1 Inhaler 0   diclofenac sodium (VOLTAREN) 1 % GEL Apply topically 2 (two) times daily as needed (pain).     doxycycline (ADOXA) 100 MG tablet Take 1 tablet (100 mg total) by mouth 2 (two) times daily. (Patient not taking: Reported on 10/21/2021) 20 tablet 0   enoxaparin (LOVENOX) 40 MG/0.4ML injection Inject 0.4 mLs (40 mg total) into the skin daily for 14 days. (Patient not taking: Reported on 10/21/2021) 5.6 mL 0   ipratropium-albuterol (DUONEB) 0.5-2.5 (3) MG/3ML SOLN Take 3 mLs by nebulization every 6 (six) hours as needed (shortness of breath or wheezing).     levocetirizine (XYZAL) 5 MG tablet  (Patient not taking: Reported on 10/22/2021)     magnesium hydroxide (MILK OF MAGNESIA) 400 MG/5ML suspension Take 30 mLs by mouth every 4 (four) hours as needed. If constipation/ no BM for 2 days     methocarbamol (ROBAXIN) 500 MG tablet Take 1 tablet (500 mg total) by mouth every 6 (six) hours as needed for muscle spasms. (Patient not taking: Reported on 10/21/2021) 30 tablet 0   mometasone (NASONEX) 50 MCG/ACT nasal spray Place 2 sprays into the nose daily as needed (allergies).      oxyCODONE (OXY IR/ROXICODONE) 5 MG immediate release tablet Take 1-2 tablets (5-10 mg total) by mouth every 4 (four) hours as needed for moderate pain (pain score 4-6). 30 tablet 0   sodium chloride flush (NS) 0.9 % SOLN  10-40 mLs by Intracatheter route every 12 (twelve) hours. 56 vial 0    sodium chloride flush (NS) 0.9 % SOLN 10-40 mLs by Intracatheter route as needed (flush). 30 vial 0   ULORIC 40 MG tablet Take 40 mg by mouth daily.  (Patient not taking: Reported on 10/21/2021)       Abtx:  Anti-infectives (From admission, onward)    Start     Dose/Rate Route Frequency Ordered Stop   10/23/21 0600  ceFAZolin (ANCEF) IVPB 2g/100 mL premix        2 g 200 mL/hr over 30 Minutes Intravenous On call to O.R. 10/22/21 1622 10/24/21 0559   10/22/21 1000  ertapenem (INVANZ) 500 mg in sodium chloride 0.9 % 50 mL IVPB        500 mg 100 mL/hr over 30 Minutes Intravenous Every 24 hours 10/22/21 0848         REVIEW OF SYSTEMS:  Const: negative fever, negative chills, negative weight loss Eyes: negative diplopia or visual changes, negative eye pain ENT: negative coryza, negative sore throat Resp: negative cough, hemoptysis, dyspnea Cards: negative for chest pain, palpitations, lower extremity edema GU: negative for frequency, dysuria and hematuria GI: Negative for abdominal pain, diarrhea, bleeding, constipation Skin: negative for rash and pruritus Heme: negative for easy bruising and gum/nose bleeding MS: some weakness Rt hip pain improved Neurolo:negative for headaches, dizziness, vertigo, memory problems  Psych: negative for feelings of anxiety, depression  Endocrine: , diabetes Allergy/Immunology-PCN: Objective:  VITALS:  BP (!) 166/68   Pulse (!) 58   Temp 98.3 F (36.8 C)   Resp 18   Ht 5\' 2"  (1.575 m)   Wt 75.3 kg   LMP 05/28/1969 (Approximate) Comment: hysterectomy  SpO2 96%   BMI 30.36 kg/m    PHYSICAL EXAM:  General: Alert, cooperative, no distress, appears stated age.  Head: Normocephalic, without obvious abnormality, atraumatic. Eyes: Conjunctivae clear, anicteric sclerae. Pupils are equal ENT Nares normal. No drainage or sinus tenderness. Lips, mucosa, and tongue normal. No Thrush Neck: Supple, symmetrical, no adenopathy, thyroid: non tender no  carotid bruit and no JVD. Back: No CVA tenderness. Lungs: Clear to auscultation bilaterally. No Wheezing or Rhonchi. No rales. Heart: Regular rate and rhythm, no murmur, rub or gallop. Abdomen: Soft, non-tender,not distended. Bowel sounds normal. No masses Extremities: rt arm swollen Rt hip surgical scar healed well Skin: No rashes or lesions. Or bruising Lymph: Cervical, supraclavicular normal. Neurologic: Grossly non-focal Pertinent Labs Lab Results CBC    Component Value Date/Time   WBC 5.0 10/22/2021 0703   RBC 2.83 (L) 10/22/2021 0703   HGB 8.7 (L) 10/22/2021 0703   HGB 11.6 (L) 01/08/2014 1109   HCT 26.6 (L) 10/22/2021 0703   HCT 35.6 01/08/2014 1109   PLT 328 10/22/2021 0703   PLT 282 01/08/2014 1109   MCV 94.0 10/22/2021 0703   MCV 93 01/08/2014 1109   MCH 30.7 10/22/2021 0703   MCHC 32.7 10/22/2021 0703   RDW 13.4 10/22/2021 0703   RDW 14.0 01/08/2014 1109   LYMPHSABS 2.1 10/22/2021 0117   MONOABS 0.5 10/22/2021 0117   EOSABS 0.4 10/22/2021 0117   BASOSABS 0.1 10/22/2021 0117       Latest Ref Rng & Units 10/22/2021    7:03 AM 10/22/2021    1:17 AM 10/21/2021    1:41 PM  CMP  Glucose 70 - 99 mg/dL 83  58  189   BUN 8 - 23 mg/dL 33  36  38   Creatinine  0.44 - 1.00 mg/dL 1.69  1.99  2.28   Sodium 135 - 145 mmol/L 139  141  136   Potassium 3.5 - 5.1 mmol/L 3.5  3.6  4.1   Chloride 98 - 111 mmol/L 114  112  108   CO2 22 - 32 mmol/L 19  23  21    Calcium 8.9 - 10.3 mg/dL 8.1  9.2  9.3   Total Protein 6.5 - 8.1 g/dL  7.1  7.9   Total Bilirubin 0.3 - 1.2 mg/dL  0.5  0.6   Alkaline Phos 38 - 126 U/L  118  134   AST 15 - 41 U/L  28  26   ALT 0 - 44 U/L  21  22     ? Impression/Recommendation DVT Rt arm involing the subclavian, brachial and axillary due to PICC-  On IV heparin- PICC removed- seen by vascular- for possible thrombectomy tomorrow ? Actinomyces infection-of rt hip prosthetic joint   initially thought to be soft tissue infection at the surgical  site Rt hip, but even the joint capsule was positive She is on week 6  of Iv antibiotic- initially was on ceftriaxone and after 3 weeks ,the LFTS increased and hence it was changed to ertapenem on 9/18 . LFTS normalized She will complete on 10/12 After that will need 6 weeks of oral antibiotic Continue ertapenem IV for now Can do  amoxicillin ( she has a past h/o PCN allergyafter giving test dose PO If she develops rash then can switch to Doxy    DM on januvia    HTN on amlodipine ? ___________________________________________________ Discussed with patient, requesting provider Note:  This document was prepared using Dragon voice recognition software and may include unintentional dictation errors.

## 2021-10-22 NOTE — ED Notes (Signed)
Patients cbg read 63. Patient was given something to eat. 2nd IV was started. NP made aware.

## 2021-10-22 NOTE — Assessment & Plan Note (Addendum)
Patient completed the last couple doses of ertapenem while here.  The patient had test doses of amoxicillin here and tolerated this.  Case discussed with Dr. Steva Ready infectious disease and she recommended another month of amoxicillin orally.  She will follow-up in 2 weeks.

## 2021-10-22 NOTE — Progress Notes (Signed)
[place] for Heparin Drip Indication: DVT  Allergies  Allergen Reactions   Hydralazine Itching   Meloxicam Other (See Comments)    Causes excessive sleepiness fainting   Penicillins Hives, Itching and Other (See Comments)    Whelps Has patient had a PCN reaction causing immediate rash, facial/tongue/throat swelling, SOB or lightheadedness with hypotension: Yes Has patient had a PCN reaction causing severe rash involving mucus membranes or skin necrosis: No Has patient had a PCN reaction that required hospitalization: No Has patient had a PCN reaction occurring within the last 10 years: No If all of the above answers are "NO", then may proceed with Cephalosporin use.    Pioglitazone Itching   Tramadol Other (See Comments)    Causes excessive sleepiness Dizzy/fainting    Patient Measurements: Height: 5\' 2"  (157.5 cm) Weight: 75.3 kg (166 lb) IBW/kg (Calculated) : 50.1 Heparin Dosing Weight: 66.4 kg  Vital Signs: Temp: 98.1 F (36.7 C) (10/11 0547) Temp Source: Oral (10/11 0547) BP: 147/59 (10/11 0547) Pulse Rate: 64 (10/11 0547)  Labs: Recent Labs    10/21/21 1341 10/21/21 2040 10/22/21 0117 10/22/21 0703  HGB 9.7*  --  9.4*  --   HCT 29.5*  --  29.4*  --   PLT 360  --  346  --   APTT  --  31  --   --   LABPROT  --  14.8  --   --   INR  --  1.2  --   --   HEPARINUNFRC  --   --   --  0.79*  CREATININE 2.28*  --  1.99* 1.69*     Estimated Creatinine Clearance: 25.2 mL/min (A) (by C-G formula based on SCr of 1.69 mg/dL (H)).   Medical History: Past Medical History:  Diagnosis Date   Anemia    Anemia, unspecified    Hgb 11.8 - 04/2013   Arthritis    Cataract    Bilateral   CHF (congestive heart failure) (HCC)    Chronic kidney disease    COPD (chronic obstructive pulmonary disease) (HCC)    DDD (degenerative disc disease), lumbar    Degenerative arthritis    Diabetes mellitus type 2, uncomplicated (HCC)    AODM  (A1C 6.9%) 04/2013   Diabetes mellitus without complication (HCC)    Dyspnea    with exertion   Esophagitis    Gout    Uric acid 5.4 - 08/2012   Hypertension    Obesity, unspecified    Sarcoidosis, lung ([place])    reported by pt, Clinically without a biopsy   Sleep apnea    OSA--Use C-PAP   Assessment: Patient is a 81yo female admitted for upper extremity DVT. Pharmacy consulted for Heparin dosing.  10/11: HL 0.79 @ 0703  Goal of Therapy:  Heparin level 0.3-0.7 units/ml Monitor platelets by anticoagulation protocol: Yes   Plan:  Reduce rate by 1 unit/kg/hr New rate is 1050 units/hr Recheck HL in 8 hours Recheck CBC 10/12   Lorin Picket  10/22/2021 8:03 AM

## 2021-10-22 NOTE — Assessment & Plan Note (Addendum)
With current height and weight in computer BMI now down to 29.41

## 2021-10-22 NOTE — Assessment & Plan Note (Addendum)
CKD stage IIIb creatinine 1.79 upon discharge

## 2021-10-22 NOTE — Assessment & Plan Note (Addendum)
Last hemoglobin 9.1.  With ferritin of 140 this goes along with anemia of chronic disease

## 2021-10-22 NOTE — Inpatient Diabetes Management (Signed)
Inpatient Diabetes Program Recommendations  AACE/ADA: New Consensus Statement on Inpatient Glycemic Control   Target Ranges:  Prepandial:   less than 140 mg/dL      Peak postprandial:   less than 180 mg/dL (1-2 hours)      Critically ill patients:  140 - 180 mg/dL    Latest Reference Range & Units 10/22/21 00:59 10/22/21 02:13  Glucose-Capillary 70 - 99 mg/dL 63 (L) 112 (H)   Review of Glycemic Control  Diabetes history: DM2 Outpatient Diabetes medications: Glipizide XL 10 mg BID, Januvia 50 mg daily Current orders for Inpatient glycemic control: Tradjenta 5 mg daily, Glipizide XL 10 mg BID  Inpatient Diabetes Program Recommendations:    Insulin: Please discontinue Glipizide and order Novolog 0-6 units TID with meals.  Thanks, Barnie Alderman, RN, MSN, [facility] Coordinator Inpatient Diabetes Program [phone] (Team Pager from 8am to [place])

## 2021-10-22 NOTE — ED Notes (Addendum)
CBG checked by this RN, 119. Primary RN notified.

## 2021-10-22 NOTE — Assessment & Plan Note (Addendum)
Patient not on any rate controlling medications.  Likely sleep apnea as a cause of bradycardia overnight

## 2021-10-22 NOTE — Assessment & Plan Note (Addendum)
-   CPAP at night °

## 2021-10-22 NOTE — Progress Notes (Signed)
[place] for Heparin Drip Indication: DVT  Allergies  Allergen Reactions   Hydralazine Itching   Meloxicam Other (See Comments)    Causes excessive sleepiness fainting   Penicillins Hives, Itching and Other (See Comments)    [place]ps Has patient had a PCN reaction causing immediate rash, facial/tongue/throat swelling, SOB or lightheadedness with hypotension: Yes Has patient had a PCN reaction causing severe rash involving mucus membranes or skin necrosis: No Has patient had a PCN reaction that required hospitalization: No Has patient had a PCN reaction occurring within the last 10 years: No If all of the above answers are "NO", then may proceed with Cephalosporin use.    Pioglitazone Itching   Tramadol Other (See Comments)    Causes excessive sleepiness Dizzy/fainting    Patient Measurements: Height: 5\' 2"  (157.5 cm) Weight: 75.3 kg (166 lb) IBW/kg (Calculated) : 50.1 Heparin Dosing Weight: 66.4 kg  Vital Signs: Temp: 98.3 F (36.8 C) (10/11 1200) Temp Source: Oral (10/11 1200) BP: 154/64 (10/11 1200) Pulse Rate: 63 (10/11 1200)  Labs: Recent Labs    10/21/21 1341 10/21/21 2040 10/22/21 0117 10/22/21 0703  HGB 9.7*  --  9.4* 8.7*  HCT 29.5*  --  29.4* 26.6*  PLT 360  --  346 328  APTT  --  31  --   --   LABPROT  --  14.8  --   --   INR  --  1.2  --   --   HEPARINUNFRC  --   --   --  0.79*  CREATININE 2.28*  --  1.99* 1.69*    Estimated Creatinine Clearance: 25.2 mL/min (A) (by C-G formula based on SCr of 1.69 mg/dL (H)).   Medical History: Past Medical History:  Diagnosis Date   Anemia    Anemia, unspecified    Hgb 11.8 - 04/2013   Arthritis    Cataract    Bilateral   CHF (congestive heart failure) (HCC)    Chronic kidney disease    COPD (chronic obstructive pulmonary disease) (HCC)    DDD (degenerative disc disease), lumbar    Degenerative arthritis    Diabetes mellitus type 2, uncomplicated (HCC)    AODM  (A1C 6.9%) 04/2013   Diabetes mellitus without complication (HCC)    Dyspnea    with exertion   Esophagitis    Gout    Uric acid 5.4 - 08/2012   Hypertension    Obesity, unspecified    Sarcoidosis, lung ([place])    reported by pt, Clinically without a biopsy   Sleep apnea    OSA--Use C-PAP   Assessment: 81yo female with PMH COPD, CHF, DDD, type 2 diabetes mellitus, hypertension, sarcoidosis, gout admitted for upper extremity DVT involving the subclavian, axillary, and brachial veins. Pharmacy consulted for heparin dosing. Thrombectomy scheduled for 10/13. No chronic anticoagulation from chart review.  Heparin Dosing Weight: 66.4 kg  Date Time aPTT/HL Rate/Comment 10/11 0703 0.79  Supratherapeutic; 1100 >> 1050 un/hr 10/11 1646 0.47  Therapeutic x 1; 1050 un/hr     Baseline Labs: aPTT - 31; INR - 1.2 Hgb - 9.7; Plts - 360  Goal of Therapy:  Heparin level 0.3-0.7 units/ml Monitor platelets by anticoagulation protocol: Yes   Plan:  Continue heparin drip at 1050 un/hr Check anti-Xa level in 8 hours and daily once consecutively therapeutic. Continue to monitor H&H and platelets daily while on heparin gtt.  Dara Hoyer, PharmD PGY-1 Pharmacy Resident 10/22/2021 3:43 PM

## 2021-10-23 ENCOUNTER — Other Ambulatory Visit (HOSPITAL_COMMUNITY): Payer: Self-pay

## 2021-10-23 ENCOUNTER — Other Ambulatory Visit: Payer: Self-pay

## 2021-10-23 ENCOUNTER — Encounter
Admission: EM | Disposition: A | Discharge status: Home Health | Payer: Self-pay | Source: Home / Self Care | Attending: Internal Medicine

## 2021-10-23 ENCOUNTER — Telehealth (HOSPITAL_COMMUNITY): Payer: Self-pay | Admitting: Pharmacy Technician

## 2021-10-23 DIAGNOSIS — T8459XS Infection and inflammatory reaction due to other internal joint prosthesis, sequela: Secondary | ICD-10-CM | POA: Diagnosis not present

## 2021-10-23 DIAGNOSIS — I82A11 Acute embolism and thrombosis of right axillary vein: Secondary | ICD-10-CM

## 2021-10-23 DIAGNOSIS — N1832 Chronic kidney disease, stage 3b: Secondary | ICD-10-CM | POA: Diagnosis not present

## 2021-10-23 DIAGNOSIS — R001 Bradycardia, unspecified: Secondary | ICD-10-CM | POA: Diagnosis not present

## 2021-10-23 DIAGNOSIS — I82B11 Acute embolism and thrombosis of right subclavian vein: Secondary | ICD-10-CM

## 2021-10-23 DIAGNOSIS — E11649 Type 2 diabetes mellitus with hypoglycemia without coma: Secondary | ICD-10-CM

## 2021-10-23 DIAGNOSIS — D638 Anemia in other chronic diseases classified elsewhere: Secondary | ICD-10-CM

## 2021-10-23 DIAGNOSIS — I871 Compression of vein: Secondary | ICD-10-CM

## 2021-10-23 DIAGNOSIS — I82621 Acute embolism and thrombosis of deep veins of right upper extremity: Secondary | ICD-10-CM | POA: Diagnosis not present

## 2021-10-23 HISTORY — PX: PERIPHERAL VASCULAR THROMBECTOMY: CATH118306

## 2021-10-23 LAB — GLUCOSE, CAPILLARY
Glucose-Capillary: 131 mg/dL — ABNORMAL HIGH (ref 70–99)
Glucose-Capillary: 112 mg/dL — ABNORMAL HIGH (ref 70–99)
Glucose-Capillary: 68 mg/dL — ABNORMAL LOW (ref 70–99)
Glucose-Capillary: 65 mg/dL — ABNORMAL LOW (ref 70–99)
Glucose-Capillary: 134 mg/dL — ABNORMAL HIGH (ref 70–99)
Glucose-Capillary: 96 mg/dL (ref 70–99)
Glucose-Capillary: 106 mg/dL — ABNORMAL HIGH (ref 70–99)
Glucose-Capillary: 111 mg/dL — ABNORMAL HIGH (ref 70–99)
Glucose-Capillary: 129 mg/dL — ABNORMAL HIGH (ref 70–99)
Glucose-Capillary: 123 mg/dL — ABNORMAL HIGH (ref 70–99)

## 2021-10-23 LAB — CBC
HCT: 27.6 % — ABNORMAL LOW (ref 36.0–46.0)
Hemoglobin: 8.9 g/dL — ABNORMAL LOW (ref 12.0–15.0)
MCH: 30.6 pg (ref 26.0–34.0)
MCHC: 32.2 g/dL (ref 30.0–36.0)
MCV: 94.8 fL (ref 80.0–100.0)
Platelets: 332 10*3/uL (ref 150–400)
RBC: 2.91 MIL/uL — ABNORMAL LOW (ref 3.87–5.11)
RDW: 13.4 % (ref 11.5–15.5)
WBC: 4.9 10*3/uL (ref 4.0–10.5)
nRBC: 0 % (ref 0.0–0.2)

## 2021-10-23 LAB — HEPARIN LEVEL (UNFRACTIONATED)
Heparin Unfractionated: 0.41 IU/mL (ref 0.30–0.70)
Heparin Unfractionated: 0.45 IU/mL (ref 0.30–0.70)

## 2021-10-23 LAB — IRON AND TIBC
Iron: 56 ug/dL (ref 28–170)
Saturation Ratios: 55 % — ABNORMAL HIGH (ref 10.4–31.8)
TIBC: 102 ug/dL — ABNORMAL LOW (ref 250–450)
UIBC: 46 ug/dL

## 2021-10-23 LAB — FERRITIN: Ferritin: 140 ng/mL (ref 11–307)

## 2021-10-23 SURGERY — UPPER EXTREMITY VENOGRAPHY
Anesthesia: Moderate Sedation | Laterality: Right

## 2021-10-23 SURGERY — PERIPHERAL VASCULAR THROMBECTOMY
Anesthesia: Moderate Sedation | Laterality: Right

## 2021-10-23 MED ORDER — HEPARIN SODIUM (PORCINE) 1000 UNIT/ML IJ SOLN
INTRAMUSCULAR | Status: AC
  Filled 2021-10-23: qty 10

## 2021-10-23 MED ORDER — APIXABAN 5 MG PO TABS
10.0000 mg | ORAL_TABLET | Freq: Two times a day (BID) | ORAL | Status: DC
  Administered 2021-10-24: 10 mg via ORAL
  Filled 2021-10-23: qty 2

## 2021-10-23 MED ORDER — HYDROMORPHONE HCL 1 MG/ML IJ SOLN: 1.0000 mg | Freq: Once | INTRAMUSCULAR | Status: DC | PRN

## 2021-10-23 MED ORDER — DIPHENHYDRAMINE HCL 50 MG/ML IJ SOLN: 50.0000 mg | Freq: Once | INTRAMUSCULAR | Status: DC | PRN

## 2021-10-23 MED ORDER — FENTANYL CITRATE (PF) 100 MCG/2ML IJ SOLN
INTRAMUSCULAR | Status: DC | PRN
  Administered 2021-10-23: 50 ug via INTRAVENOUS

## 2021-10-23 MED ORDER — HEPARIN (PORCINE) 25000 UT/250ML-% IV SOLN: 1050.0000 [IU]/h | INTRAVENOUS | Status: DC

## 2021-10-23 MED ORDER — AMOXICILLIN 250 MG/5ML PO SUSR
50.0000 mg | Freq: Once | ORAL | Status: AC
  Administered 2021-10-24: 50 mg via ORAL
  Filled 2021-10-23: qty 5
  Filled 2021-10-23: qty 1
  Filled 2021-10-23: qty 5

## 2021-10-23 MED ORDER — HEPARIN SODIUM (PORCINE) 1000 UNIT/ML IJ SOLN
INTRAMUSCULAR | Status: DC | PRN
  Administered 2021-10-23: 4000 [IU] via INTRAVENOUS

## 2021-10-23 MED ORDER — MIDAZOLAM HCL 2 MG/2ML IJ SOLN
INTRAMUSCULAR | Status: AC
  Filled 2021-10-23: qty 4

## 2021-10-23 MED ORDER — VANCOMYCIN HCL IN DEXTROSE 1-5 GM/200ML-% IV SOLN
INTRAVENOUS | Status: AC
  Administered 2021-10-23: 1000 mg via INTRAVENOUS
  Filled 2021-10-23: qty 200

## 2021-10-23 MED ORDER — MIDAZOLAM HCL 2 MG/2ML IJ SOLN
INTRAMUSCULAR | Status: DC | PRN
  Administered 2021-10-23: 2 mg via INTRAVENOUS

## 2021-10-23 MED ORDER — METHYLPREDNISOLONE SODIUM SUCC 125 MG IJ SOLR: 125.0000 mg | Freq: Once | INTRAMUSCULAR | Status: DC | PRN

## 2021-10-23 MED ORDER — DEXTROSE 5 % IV SOLN: INTRAVENOUS | Status: DC

## 2021-10-23 MED ORDER — DEXTROSE 50 % IV SOLN
INTRAVENOUS | Status: AC
  Administered 2021-10-23: 12.5 mL
  Filled 2021-10-23: qty 50

## 2021-10-23 MED ORDER — MIDAZOLAM HCL 2 MG/ML PO SYRP: 8.0000 mg | ORAL_SOLUTION | Freq: Once | ORAL | Status: DC | PRN

## 2021-10-23 MED ORDER — ONDANSETRON HCL 4 MG/2ML IJ SOLN: 4.0000 mg | Freq: Four times a day (QID) | INTRAMUSCULAR | Status: DC | PRN

## 2021-10-23 MED ORDER — FENTANYL CITRATE PF 50 MCG/ML IJ SOSY
PREFILLED_SYRINGE | INTRAMUSCULAR | Status: AC
  Filled 2021-10-23: qty 2

## 2021-10-23 MED ORDER — VANCOMYCIN HCL IN DEXTROSE 1-5 GM/200ML-% IV SOLN: 1000.0000 mg | INTRAVENOUS | Status: DC

## 2021-10-23 MED ORDER — AMOXICILLIN 500 MG PO CAPS
500.0000 mg | ORAL_CAPSULE | Freq: Two times a day (BID) | ORAL | Status: DC
  Filled 2021-10-23: qty 1

## 2021-10-23 MED ORDER — FAMOTIDINE 20 MG PO TABS: 40.0000 mg | ORAL_TABLET | Freq: Once | ORAL | Status: DC | PRN

## 2021-10-23 MED ORDER — IODIXANOL 320 MG/ML IV SOLN
INTRAVENOUS | Status: DC | PRN
  Administered 2021-10-23: 35 mL

## 2021-10-23 MED ORDER — APIXABAN 5 MG PO TABS: 5.0000 mg | ORAL_TABLET | Freq: Two times a day (BID) | ORAL | Status: DC

## 2021-10-23 MED ORDER — EPINEPHRINE 0.3 MG/0.3ML IJ SOAJ: 0.3000 mg | Freq: Once | INTRAMUSCULAR | Status: DC | PRN

## 2021-10-23 MED ORDER — HEPARIN (PORCINE) 25000 UT/250ML-% IV SOLN
1050.0000 [IU]/h | INTRAVENOUS | Status: DC
  Administered 2021-10-23: 1050 [IU]/h via INTRAVENOUS
  Filled 2021-10-23: qty 250

## 2021-10-23 MED ORDER — AMOXICILLIN 250 MG PO CAPS
250.0000 mg | ORAL_CAPSULE | Freq: Once | ORAL | Status: AC
  Administered 2021-10-24: 250 mg via ORAL
  Filled 2021-10-23: qty 1

## 2021-10-23 MED ORDER — SODIUM CHLORIDE 0.9 % IV SOLN: INTRAVENOUS | Status: DC

## 2021-10-23 SURGICAL SUPPLY — 17 items
BALLN LUTONIX AV 8X60X75 (BALLOONS) ×1
BALLOON LUTONIX AV 8X60X75 (BALLOONS) IMPLANT
CANISTER PENUMBRA ENGINE (MISCELLANEOUS) IMPLANT
CATH INDIGO 7D KIT (CATHETERS) IMPLANT
CATH INDIGO SEP D (CATHETERS) IMPLANT
CATH KUMPE SOFT-VU 5FR 65 (CATHETERS) IMPLANT
COVER PROBE ULTRASOUND 5X96 (MISCELLANEOUS) IMPLANT
DRAPE BRACHIAL (DRAPES) IMPLANT
GLIDEWIRE ADV .035X180CM (WIRE) IMPLANT
KIT ENCORE 26 ADVANTAGE (KITS) IMPLANT
NDL ENTRY 21GA 7CM ECHOTIP (NEEDLE) IMPLANT
NEEDLE ENTRY 21GA 7CM ECHOTIP (NEEDLE) ×1 IMPLANT
PACK ANGIOGRAPHY (CUSTOM PROCEDURE TRAY) ×1 IMPLANT
SET INTRO CAPELLA COAXIAL (SET/KITS/TRAYS/PACK) IMPLANT
SHEATH BRITE TIP 6FRX5.5 (SHEATH) IMPLANT
SHEATH BRITE TIP 7FRX5.5 (SHEATH) IMPLANT
SUT MNCRL AB 4-0 PS2 18 (SUTURE) IMPLANT

## 2021-10-23 NOTE — Progress Notes (Signed)
Progress Note   Patient: Mackenzie Hill QAS:[id number] DOB: Oct 30, 1940 DOA: 10/21/2021     2 DOS: the patient was seen and examined on 10/23/2021     Assessment and Plan: * Acute deep vein thrombosis (DVT) of right upper extremity [facility]) Patient had thrombectomy today and angioplasty today.  Patient on heparin drip  and looks like will be switched over to Eliquis tomorrow morning.  Sinus bradycardia Patient not on any rate controlling medications.  Continue to monitor on telemetry.  Likely sleep apnea as a cause of bradycardia overnight  Type 2 diabetes mellitus with hypoglycemia (HCC) Sugar of 65 this morning.  Started low-dose D5 drip since the patient was n.p.o. for procedure.  Continue to hold glipizide and Tradjenta.  Hemoglobin A1c 5.8.  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  Case discussed with infectious disease pharmacist and infectious disease specialist and we will test to see if she has an allergic reaction to amoxicillin tomorrow.  Actinomyces grew out of previous culture.  Essential hypertension Continue her antihypertensives..  Diastolic dysfunction Continue Januvia and Aldactone.  Chronic obstructive pulmonary disease (COPD) (HCC) Continue her inhalers.  Obesity With current height and weight in computer BMI now down to 29.41  GERD without esophagitis continue PPI therapy  Gout continue allopurinol.  CKD (chronic kidney disease), stage III (HCC) CKD stage IIIb.  Creatinine 1.69 with a GFR of 30.  Patient receiving gentle fluids today we will check creatinine again tomorrow.  OSA on CPAP CPAP ordered today.  Anemia of chronic disease Last hemoglobin 8.9.  With ferritin of 140 this goes along with anemia of chronic disease        Subjective: Patient seen this morning prior to procedure.  She was feeling well.  The swelling of her right arm is down a little bit.  No pain.  Admitted with DVT right upper remedy  secondary to PICC line.  Physical Exam: Vitals:   10/23/21 1400 10/23/21 1415 10/23/21 1430 10/23/21 1539  BP: (!) 156/75 123/82 133/79 127/82  Pulse: 74 (!) 45 (!) 59 (!) 48  Resp: 19 18 19 16   Temp:    97.8 F (36.6 C)  TempSrc:      SpO2: 91% 92% 94%   Weight:      Height:       Physical Exam HENT:     Head: Normocephalic.     Mouth/Throat:     Pharynx: No oropharyngeal exudate.  Eyes:     General: Lids are normal.     Conjunctiva/sclera: Conjunctivae normal.  Cardiovascular:     Rate and Rhythm: Normal rate and regular rhythm.     Heart sounds: Normal heart sounds, S1 normal and S2 normal.  Pulmonary:     Breath sounds: No decreased breath sounds, wheezing, rhonchi or rales.  Abdominal:     Palpations: Abdomen is soft.     Tenderness: There is no abdominal tenderness.  Musculoskeletal:     Right forearm: Swelling present.     Right lower leg: Swelling present.     Left lower leg: Swelling present.  Skin:    General: Skin is warm.     Findings: No rash.  Neurological:     Mental Status: She is alert and oriented to person, place, and time.     Data Reviewed: Ferritin 140, hemoglobin 8.9, creatinine 1.69  Family Communication: Spoke with niece at the bedside  Disposition: Status is: Inpatient Remains inpatient appropriate because: Patient had thrombectomy today  Planned Discharge Destination: Home    Time spent: 28 minutes  Author: Loletha Grayer, MD 10/23/2021 5:04 PM  For on call review [email or web address].

## 2021-10-23 NOTE — Plan of Care (Signed)

## 2021-10-23 NOTE — Progress Notes (Signed)
Tele called write stating pt heart rate decreased to 39 for approximately 15 secs but increased to 55. Heart rate noted to be fluctuating 20's-50's  , pt denies any pain or discomfort. On call provider made aware, new order for EKG ,Blood sugar checked per order 112, and bp was 124/86. Will continue to monitor .

## 2021-10-23 NOTE — Assessment & Plan Note (Addendum)
Hemoglobin A1c on the lower side at 5.8.  During the hospital course I did give D5 drip secondary to low sugar and being n.p.o. for procedure.  Will discontinue Glucotrol XL upon discharge.  He got back on Januvia as outpatient.

## 2021-10-23 NOTE — Progress Notes (Signed)
[place] for Heparin Drip Indication: DVT  Allergies  Allergen Reactions   Hydralazine Itching   Meloxicam Other (See Comments)    Causes excessive sleepiness fainting   Penicillins Hives, Itching and Other (See Comments)    [place] Has patient had a PCN reaction causing immediate rash, facial/tongue/throat swelling, SOB or lightheadedness with hypotension: Yes Has patient had a PCN reaction causing severe rash involving mucus membranes or skin necrosis: No Has patient had a PCN reaction that required hospitalization: No Has patient had a PCN reaction occurring within the last 10 years: No If all of the above answers are "NO", then may proceed with Cephalosporin use.    Pioglitazone Itching   Tramadol Other (See Comments)    Causes excessive sleepiness Dizzy/fainting    Patient Measurements: Height: 5\' 3"  (160 cm) Weight: 75.3 kg (166 lb) IBW/kg (Calculated) : 52.4 Heparin Dosing Weight: 66.4 kg  Vital Signs: Temp: 98.4 F (36.9 C) (10/12 2120) Temp Source: Oral (10/12 1219) BP: 166/90 (10/12 2120) Pulse Rate: 80 (10/12 2120)  Labs: Recent Labs    10/21/21 1341 10/21/21 2040 10/22/21 0117 10/22/21 0117 10/22/21 0703 10/22/21 1646 10/23/21 0102 10/23/21 0508 10/23/21 2248  HGB 9.7*  --  9.4*  --  8.7*  --   --  8.9*  --   HCT 29.5*  --  29.4*  --  26.6*  --   --  27.6*  --   PLT 360  --  346  --  328  --   --  332  --   APTT  --  31  --   --   --   --   --   --   --   LABPROT  --  14.8  --   --   --   --   --   --   --   INR  --  1.2  --   --   --   --   --   --   --   HEPARINUNFRC  --   --   --    < > 0.79* 0.47 0.41  --  0.45  CREATININE 2.28*  --  1.99*  --  1.69*  --   --   --   --    < > = values in this interval not displayed.     Estimated Creatinine Clearance: 25.8 mL/min (A) (by C-G formula based on SCr of 1.69 mg/dL (H)).   Medical History: Past Medical History:  Diagnosis Date   Anemia    Anemia,  unspecified    Hgb 11.8 - 04/2013   Arthritis    Cataract    Bilateral   CHF (congestive heart failure) (HCC)    Chronic kidney disease    COPD (chronic obstructive pulmonary disease) (HCC)    DDD (degenerative disc disease), lumbar    Degenerative arthritis    Diabetes mellitus type 2, uncomplicated (HCC)    AODM (A1C 6.9%) 04/2013   Diabetes mellitus without complication (HCC)    Dyspnea    with exertion   Esophagitis    Gout    Uric acid 5.4 - 08/2012   Hypertension    Obesity, unspecified    Sarcoidosis, lung ([place])    reported by pt, Clinically without a biopsy   Sleep apnea    OSA--Use C-PAP   Assessment: 81yo female with PMH COPD, CHF, DDD, type 2 diabetes mellitus, hypertension, sarcoidosis, gout admitted for upper  extremity DVT involving the subclavian, axillary, and brachial veins. Pharmacy consulted for heparin dosing and no chronic anticoagulation from chart review. Thrombectomy completed on 10/12.  Heparin Dosing Weight: 66.4 kg  Date Time aPTT/HL Rate/Comment 10/11 0703 0.79  Supratherapeutic; 1100 >> 1050 un/hr 10/11 1646 0.47  Therapeutic x 1; 1050 un/hr  10/12   0102   0.41                 Therapeutic X 2 10/12 2248   0.45                 Therapeutic X 3    Baseline Labs: aPTT - 31; INR - 1.2 Hgb - 9.7; Plts - 360  Goal of Therapy:  Heparin level 0.3-0.7 units/ml Monitor platelets by anticoagulation protocol: Yes   Plan: 10/12: s/p peripheral thrombectomy. Pt received heparin 4,000 units during the procedure. Restart heparin at previous rate (no bolus) @ 1430 per MD, with plan to transition to Eliquis tomorrow, 10/13 AM Check anti-Xa level in 8 hours and daily once consecutively therapeutic.  10/2:  HL @ 2248 = 0.45, therapeutic X 2  -  Will continue pt on current rate and recheck HL on 10/13 with AM labs.   Continue to monitor H&H and platelets daily while on heparin gtt.  Nera Haworth D 10/23/2021 11:57 PM

## 2021-10-23 NOTE — Inpatient Diabetes Management (Signed)
Inpatient Diabetes Program Recommendations  AACE/ADA: New Consensus Statement on Inpatient Glycemic Control   Target Ranges:  Prepandial:   less than 140 mg/dL      Peak postprandial:   less than 180 mg/dL (1-2 hours)      Critically ill patients:  140 - 180 mg/dL    Latest Reference Range & Units 10/23/21 01:35 10/23/21 04:17 10/23/21 06:13 10/23/21 06:37  Glucose-Capillary 70 - 99 mg/dL 112 (H) 68 (L) 65 (L) 134 (H)    Latest Reference Range & Units 10/22/21 07:00  Hemoglobin A1C 4.8 - 5.6 % 5.8 (H)   Review of Glycemic Control  Diabetes history: DM2 Outpatient Diabetes medications: Glipizide XL 10 mg BID, Januvia 50 mg daily Current orders for Inpatient glycemic control: Tradjenta 5 mg daily, Novolog 0-9 units TID with meals, Novolog 0-5 units QHS  Inpatient Diabetes Program Recommendations:    Oral DM medication: CBG 68 mg/dl at 4:17 am and 65 mg/dl at 6:13 am today.  May want to consider discontinuing Tradjenta while inpatient.  HbgA1C:  A1C 5.8% on 10/22/21 indicating an average glucose of 120 mg/dl over the past 2-3 months.   Outpatient DM medications: Given A1C of 5.8% and noted hypoglycemia as inpatient, would recommend to discontinue Glipizide as outpatient and have patient follow up with PCP regarding DM control.  Thanks, Barnie Alderman, RN, MSN, [place] Diabetes Coordinator Inpatient Diabetes Program [phone] (Team Pager from 8am to [place])

## 2021-10-23 NOTE — Op Note (Signed)
[place] [facility]                                                                               OPERATIVE NOTE    Date of Surgery:  10/23/2021  PRE-OPERATIVE DIAGNOSIS: Symptomatic extensive right upper extremity DVT  POST-OPERATIVE DIAGNOSIS: Same  PROCEDURE: 1.    Ultrasound guidance for vascular access to the right distal brachial vein 2.    Catheter placement into the superior vena cava  3.    Superior venacavogram 4.     Mechanical thrombectomy of the right brachial vein, axillary vein and subclavian vein 5.      Percutaneous transluminal angioplasty of the right clavian vein to 8 mm.   SURGEON: Hortencia Pilar  ASSISTANT(S): None  ANESTHESIA: Conscious sedation was administered by the interventional radiology RN under my direct supervision. IV Versed plus fentanyl were utilized. Continuous ECG, pulse oximetry and blood pressure was monitored throughout the entire procedure.  Conscious sedation was administered for a total of 36 minutes and 54 seconds.  ESTIMATED BLOOD LOSS: 200 cc  Contrast: 35 cc  Fluoroscopy time: 6.7 minutes  FINDING(S): 1.  Patent SVC; thrombus within the right brachial axillary and subclavian veins.  Once the thrombus was removed a greater than 80% stenosis at the confluence of the subclavian and innominate veins was identified.  SPECIMEN(S):  none  INDICATIONS:    Mackenzie Hill is a 81 y.o. year old female who presents with massive swelling of the right arm which is quite painful.  Risks and benefits including filter thrombosis, migration, fracture, bleeding, and infection were all discussed.     DESCRIPTION: After obtaining full informed written consent, the patient was brought back to the vascular suite. The skin was sterilely prepped and draped in a sterile surgical field was created.  Ultrasound was placed in a sterile sleeve.  The right brachial vein was  image with the ultrasound.  It was echolucent and compressible indicating patency as I scanned more proximally thrombus is noted within the brachial vein so I once again looked more distally toward the antecubital fossa and chose an area to access that was free of thrombus.  Image was recorded for the permanent record. The right brachial vein was accessed under direct ultrasound guidance without difficulty with a micropuncture needle and a microwire was advanced without difficulty.   A microsheath was then inserted and then a J-wire was then placed.  6 French sheath was then placed.  Hand-injection of contrast verified thrombus within the brachial vein as well as throughout the entire length of the axillary vein and the subclavian vein.  Once the stiff angled Glidewire and Kumpe catheter were negotiated into the innominate vein hand-injection within the innominate and then injection within the superior vena cava demonstrated these were free of thrombus.  The  CAT 7D was then prepped on the field initially it was advanced over the wire and several passes were made.  Follow-up imaging demonstrated persistent thrombus.  The wire was then removed and a separator was then added to the CAT 7D.  Again 4-5 passes were made and I reimaged the venous system.  At this point some residual thrombus was noted in the proximal brachial vein at the confluence with the axillary vein.  This was the last bit of thrombus noted otherwise greater than 90% of the thrombus had been removed.  I then negotiated the CAT D to this area and made several short passes angling the catheter in multiple different directions.  Follow-up imaging demonstrated essentially total resolution of the thrombus.  However, thrombectomy now revealed that there was a greater than 80% stenosis noted within the proximal subclavian vein at the confluence with the innominate vein.  The wire was reintroduced and then an 8 mm x 60 mm Lutonix drug-eluting balloon  was advanced across this lesion inflated to 14 atm for 1 minute.  Follow-up imaging demonstrated less than 20% residual stenosis.  I elected to terminate the case.  Pursestring suture of 4-0 Monocryl was placed around the sheath sheath was pulled and pressure was held.  Interpretation: The initial images demonstrated thrombus throughout the brachial artery entire length of the axillary and subclavian.  Innominate vein and the superior vena cava are free of thrombus.  Following near-total thrombectomy and 80% stenosis of the subclavian is identified and is treated with an 8 mm balloon inflation with less than 20% residual stenosis.   COMPLICATIONS: None  CONDITION: Stable  Hortencia Pilar  10/23/2021,1:51 PM

## 2021-10-23 NOTE — Telephone Encounter (Signed)
Pharmacy Patient Advocate Encounter  Insurance verification completed.    The patient is insured through [employer] Medicare Part D   The patient is currently admitted and ran test claims for the following: Eliquis .  Copays and coinsurance results were relayed to Inpatient clinical team.  

## 2021-10-23 NOTE — Progress Notes (Signed)
ID Pt is looking better Underwent thrombectomy rt arm DVT Swelling better O/e awake and alert No distress BP (!) 166/90   Pulse 80   Temp 98.4 F (36.9 C)   Resp 16   Ht 5\' 3"  (1.6 m)   Wt 75.3 kg   LMP 05/28/1969 (Approximate) Comment: hysterectomy  SpO2 96%   BMI 29.41 kg/m   Chest b/l air entry [id number] Abd soft Cns non focal Rt hip surgical site looks fine Rt arm- swelling less Surgical dressing DVT removed    Labs    Latest Ref Rng & Units 10/23/2021    5:08 AM 10/22/2021    7:03 AM 10/22/2021    1:17 AM  CBC  WBC 4.0 - 10.5 K/uL 4.9  5.0  7.4   Hemoglobin 12.0 - 15.0 g/dL 8.9  8.7  9.4   Hematocrit 36.0 - 46.0 % 27.6  26.6  29.4   Platelets 150 - 400 K/uL 332  328  346         Latest Ref Rng & Units 10/22/2021    7:03 AM 10/22/2021    1:17 AM 10/21/2021    1:41 PM  CMP  Glucose 70 - 99 mg/dL 83  58  189   BUN 8 - 23 mg/dL 33  36  38   Creatinine 0.44 - 1.00 mg/dL 1.69  1.99  2.28   Sodium 135 - 145 mmol/L 139  141  136   Potassium 3.5 - 5.1 mmol/L 3.5  3.6  4.1   Chloride 98 - 111 mmol/L 114  112  108   CO2 22 - 32 mmol/L 19  23  21    Calcium 8.9 - 10.3 mg/dL 8.1  9.2  9.3   Total Protein 6.5 - 8.1 g/dL  7.1  7.9   Total Bilirubin 0.3 - 1.2 mg/dL  0.5  0.6   Alkaline Phos 38 - 126 U/L  118  134   AST 15 - 41 U/L  28  26   ALT 0 - 44 U/L  21  22      Impression/recommendation  Rt arm deep vein thrombosis of axillary, subclavian and brachial- s/p thrombectomy- on anticoagulation Swelling much better  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

## 2021-10-23 NOTE — Progress Notes (Signed)
SLP Cancellation Note  Patient Details Name: LUBNA STEGEMAN MRN: [id number] DOB: 1940/11/14   Cancelled treatment:       Reason Eval/Treat Not Completed: Patient at procedure or test/unavailable (chart reviewed; pt is at procedure. ST services will f/u tomorrow morning. NSG updated.)     Orinda Kenner, [place], CCC-SLP Speech Language Pathologist Rehab Services; [place] [phone] (ascom) Jannis Atkins 10/23/2021, 2:03 PM

## 2021-10-23 NOTE — TOC Benefit Eligibility Note (Signed)
Patient Teacher, English as a foreign language completed.    The patient is currently admitted and upon discharge could be taking Eliquis Starter Pack.  The current 30 day co-pay is $0.00.   The patient is insured through [place], [place] Patient Advocate Specialist [place] Patient Advocate Team Direct Number: [phone]  Fax: [phone]

## 2021-10-23 NOTE — Progress Notes (Addendum)
[place] for Heparin Drip Indication: DVT  Allergies  Allergen Reactions   Hydralazine Itching   Meloxicam Other (See Comments)    Causes excessive sleepiness fainting   Penicillins Hives, Itching and Other (See Comments)    [place] Has patient had a PCN reaction causing immediate rash, facial/tongue/throat swelling, SOB or lightheadedness with hypotension: Yes Has patient had a PCN reaction causing severe rash involving mucus membranes or skin necrosis: No Has patient had a PCN reaction that required hospitalization: No Has patient had a PCN reaction occurring within the last 10 years: No If all of the above answers are "NO", then may proceed with Cephalosporin use.    Pioglitazone Itching   Tramadol Other (See Comments)    Causes excessive sleepiness Dizzy/fainting    Patient Measurements: Height: 5\' 3"  (160 cm) Weight: 75.3 kg (166 lb) IBW/kg (Calculated) : 52.4 Heparin Dosing Weight: 66.4 kg  Vital Signs: Temp: 97.9 F (36.6 C) (10/12 1219) Temp Source: Oral (10/12 1219) BP: 114/67 (10/12 1336) Pulse Rate: 37 (10/12 1341)  Labs: Recent Labs    10/21/21 1341 10/21/21 2040 10/22/21 0117 10/22/21 0703 10/22/21 1646 10/23/21 0102 10/23/21 0508  HGB 9.7*  --  9.4* 8.7*  --   --  8.9*  HCT 29.5*  --  29.4* 26.6*  --   --  27.6*  PLT 360  --  346 328  --   --  332  APTT  --  31  --   --   --   --   --   LABPROT  --  14.8  --   --   --   --   --   INR  --  1.2  --   --   --   --   --   HEPARINUNFRC  --   --   --  0.79* 0.47 0.41  --   CREATININE 2.28*  --  1.99* 1.69*  --   --   --      Estimated Creatinine Clearance: 25.8 mL/min (A) (by C-G formula based on SCr of 1.69 mg/dL (H)).   Medical History: Past Medical History:  Diagnosis Date   Anemia    Anemia, unspecified    Hgb 11.8 - 04/2013   Arthritis    Cataract    Bilateral   CHF (congestive heart failure) (HCC)    Chronic kidney disease    COPD (chronic  obstructive pulmonary disease) (HCC)    DDD (degenerative disc disease), lumbar    Degenerative arthritis    Diabetes mellitus type 2, uncomplicated (HCC)    AODM (A1C 6.9%) 04/2013   Diabetes mellitus without complication (HCC)    Dyspnea    with exertion   Esophagitis    Gout    Uric acid 5.4 - 08/2012   Hypertension    Obesity, unspecified    Sarcoidosis, lung ([place])    reported by pt, Clinically without a biopsy   Sleep apnea    OSA--Use C-PAP   Assessment: 81yo female with PMH COPD, CHF, DDD, type 2 diabetes mellitus, hypertension, sarcoidosis, gout admitted for upper extremity DVT involving the subclavian, axillary, and brachial veins. Pharmacy consulted for heparin dosing and no chronic anticoagulation from chart review. Thrombectomy completed on 10/12.  Heparin Dosing Weight: 66.4 kg  Date Time aPTT/HL Rate/Comment 10/11 0703 0.79  Supratherapeutic; 1100 >> 1050 un/hr 10/11 1646 0.47  Therapeutic x 1; 1050 un/hr  10/12   0102   0.41  Therapeutic X 2 10/12    Baseline Labs: aPTT - 31; INR - 1.2 Hgb - 9.7; Plts - 360  Goal of Therapy:  Heparin level 0.3-0.7 units/ml Monitor platelets by anticoagulation protocol: Yes   Plan: 10/12: s/p peripheral thrombectomy. Pt received heparin 4,000 units during the procedure. Restart heparin at previous rate (no bolus) @ 1430 per MD, with plan to transition to Eliquis tomorrow, 10/13 AM Check anti-Xa level in 8 hours and daily once consecutively therapeutic. Continue to monitor H&H and platelets daily while on heparin gtt.  Dara Hoyer, PharmD PGY-1 Pharmacy Resident 10/23/2021 2:12 PM

## 2021-10-23 NOTE — Progress Notes (Signed)
Pt blood glucose noted to be 65, hypoglycemic protocol initiated , medication  given as directed will recheck bs in 15 mins. No signs or symptoms of hypoglycemia . Will continue to monitor

## 2021-10-23 NOTE — Progress Notes (Signed)
[place] for Heparin Drip Indication: DVT  Allergies  Allergen Reactions   Hydralazine Itching   Meloxicam Other (See Comments)    Causes excessive sleepiness fainting   Penicillins Hives, Itching and Other (See Comments)    [place] Has patient had a PCN reaction causing immediate rash, facial/tongue/throat swelling, SOB or lightheadedness with hypotension: Yes Has patient had a PCN reaction causing severe rash involving mucus membranes or skin necrosis: No Has patient had a PCN reaction that required hospitalization: No Has patient had a PCN reaction occurring within the last 10 years: No If all of the above answers are "NO", then may proceed with Cephalosporin use.    Pioglitazone Itching   Tramadol Other (See Comments)    Causes excessive sleepiness Dizzy/fainting    Patient Measurements: Height: 5\' 2"  (157.5 cm) Weight: 75.3 kg (166 lb) IBW/kg (Calculated) : 50.1 Heparin Dosing Weight: 66.4 kg  Vital Signs: Temp: 98.8 F (37.1 C) (10/11 2245) BP: 124/86 (10/12 0143) Pulse Rate: 50 (10/12 0143)  Labs: Recent Labs    10/21/21 1341 10/21/21 2040 10/22/21 0117 10/22/21 0703 10/22/21 1646 10/23/21 0102  HGB 9.7*  --  9.4* 8.7*  --   --   HCT 29.5*  --  29.4* 26.6*  --   --   PLT 360  --  346 328  --   --   APTT  --  31  --   --   --   --   LABPROT  --  14.8  --   --   --   --   INR  --  1.2  --   --   --   --   HEPARINUNFRC  --   --   --  0.79* 0.47 0.41  CREATININE 2.28*  --  1.99* 1.69*  --   --      Estimated Creatinine Clearance: 25.2 mL/min (A) (by C-G formula based on SCr of 1.69 mg/dL (H)).   Medical History: Past Medical History:  Diagnosis Date   Anemia    Anemia, unspecified    Hgb 11.8 - 04/2013   Arthritis    Cataract    Bilateral   CHF (congestive heart failure) (HCC)    Chronic kidney disease    COPD (chronic obstructive pulmonary disease) (HCC)    DDD (degenerative disc disease), lumbar     Degenerative arthritis    Diabetes mellitus type 2, uncomplicated (HCC)    AODM (A1C 6.9%) 04/2013   Diabetes mellitus without complication (HCC)    Dyspnea    with exertion   Esophagitis    Gout    Uric acid 5.4 - 08/2012   Hypertension    Obesity, unspecified    Sarcoidosis, lung ([place])    reported by pt, Clinically without a biopsy   Sleep apnea    OSA--Use C-PAP   Assessment: 81yo female with PMH COPD, CHF, DDD, type 2 diabetes mellitus, hypertension, sarcoidosis, gout admitted for upper extremity DVT involving the subclavian, axillary, and brachial veins. Pharmacy consulted for heparin dosing. Thrombectomy scheduled for 10/13. No chronic anticoagulation from chart review.  Heparin Dosing Weight: 66.4 kg  Date Time aPTT/HL Rate/Comment 10/11 0703 0.79  Supratherapeutic; 1100 >> 1050 un/hr 10/11 1646 0.47  Therapeutic x 1; 1050 un/hr  10/12   0102   0.41                 Therapeutic X 2     Baseline Labs: aPTT -  31; INR - 1.2 Hgb - 9.7; Plts - 360  Goal of Therapy:  Heparin level 0.3-0.7 units/ml Monitor platelets by anticoagulation protocol: Yes   Plan:  10/12:  HL @ 0102 = 0.41, therapeutic X 2 Will continue pt on current rate and recheck HL on 10/13 with AM labs.   Thomasena Vandenheuvel D 10/23/2021 2:09 AM

## 2021-10-24 ENCOUNTER — Encounter: Payer: Self-pay | Admitting: Vascular Surgery

## 2021-10-24 DIAGNOSIS — T8451XD Infection and inflammatory reaction due to internal right hip prosthesis, subsequent encounter: Secondary | ICD-10-CM | POA: Diagnosis not present

## 2021-10-24 DIAGNOSIS — N1832 Chronic kidney disease, stage 3b: Secondary | ICD-10-CM | POA: Diagnosis not present

## 2021-10-24 DIAGNOSIS — I82621 Acute embolism and thrombosis of deep veins of right upper extremity: Secondary | ICD-10-CM | POA: Diagnosis not present

## 2021-10-24 LAB — CBC
HCT: 27.8 % — ABNORMAL LOW (ref 36.0–46.0)
Hemoglobin: 9.1 g/dL — ABNORMAL LOW (ref 12.0–15.0)
MCH: 30.5 pg (ref 26.0–34.0)
MCHC: 32.7 g/dL (ref 30.0–36.0)
MCV: 93.3 fL (ref 80.0–100.0)
Platelets: 333 10*3/uL (ref 150–400)
RBC: 2.98 MIL/uL — ABNORMAL LOW (ref 3.87–5.11)
RDW: 13.4 % (ref 11.5–15.5)
WBC: 5.8 10*3/uL (ref 4.0–10.5)
nRBC: 0 % (ref 0.0–0.2)

## 2021-10-24 LAB — BASIC METABOLIC PANEL
Anion gap: 5 (ref 5–15)
BUN: 37 mg/dL — ABNORMAL HIGH (ref 8–23)
CO2: 21 mmol/L — ABNORMAL LOW (ref 22–32)
Calcium: 8.8 mg/dL — ABNORMAL LOW (ref 8.9–10.3)
Chloride: 111 mmol/L (ref 98–111)
Creatinine, Ser: 1.79 mg/dL — ABNORMAL HIGH (ref 0.44–1.00)
GFR, Estimated: 28 mL/min — ABNORMAL LOW (ref >60)
Glucose, Bld: 75 mg/dL (ref 70–99)
Potassium: 4.1 mmol/L (ref 3.5–5.1)
Sodium: 137 mmol/L (ref 135–145)

## 2021-10-24 LAB — HEPARIN LEVEL (UNFRACTIONATED): Heparin Unfractionated: 0.44 IU/mL (ref 0.30–0.70)

## 2021-10-24 LAB — GLUCOSE, CAPILLARY
Glucose-Capillary: 81 mg/dL (ref 70–99)
Glucose-Capillary: 195 mg/dL — ABNORMAL HIGH (ref 70–99)

## 2021-10-24 MED ORDER — APIXABAN 5 MG PO TABS: ORAL_TABLET | ORAL | 0 refills | Status: DC

## 2021-10-24 MED ORDER — AMOXICILLIN 500 MG PO CAPS: 500.0000 mg | ORAL_CAPSULE | Freq: Two times a day (BID) | ORAL | 0 refills | Status: DC

## 2021-10-24 MED ORDER — AMOXICILLIN 500 MG PO CAPS: 500.0000 mg | ORAL_CAPSULE | Freq: Two times a day (BID) | ORAL | Status: DC

## 2021-10-24 MED ORDER — AMOXICILLIN 250 MG PO CAPS
250.0000 mg | ORAL_CAPSULE | Freq: Once | ORAL | Status: AC
  Administered 2021-10-24: 250 mg via ORAL
  Filled 2021-10-24: qty 1

## 2021-10-24 NOTE — Progress Notes (Signed)
[facility] for Heparin Drip Indication: DVT  Allergies  Allergen Reactions   Hydralazine Itching   Meloxicam Other (See Comments)    Causes excessive sleepiness fainting   Penicillins Hives, Itching and Other (See Comments)    [place] Has patient had a PCN reaction causing immediate rash, facial/tongue/throat swelling, SOB or lightheadedness with hypotension: Yes Has patient had a PCN reaction causing severe rash involving mucus membranes or skin necrosis: No Has patient had a PCN reaction that required hospitalization: No Has patient had a PCN reaction occurring within the last 10 years: No If all of the above answers are "NO", then may proceed with Cephalosporin use.    Pioglitazone Itching   Tramadol Other (See Comments)    Causes excessive sleepiness Dizzy/fainting    Patient Measurements: Height: 5\' 3"  (160 cm) Weight: 75.3 kg (166 lb) IBW/kg (Calculated) : 52.4 Heparin Dosing Weight: 66.4 kg  Vital Signs: Temp: 99 F (37.2 C) (10/13 0458) BP: 143/74 (10/13 0458) Pulse Rate: 91 (10/13 0458)  Labs: Recent Labs    10/21/21 2040 10/22/21 0117 10/22/21 0703 10/22/21 1646 10/23/21 0102 10/23/21 0508 10/23/21 2248 10/24/21 0647  HGB  --  9.4* 8.7*  --   --  8.9*  --  9.1*  HCT  --  29.4* 26.6*  --   --  27.6*  --  27.8*  PLT  --  346 328  --   --  332  --  333  APTT 31  --   --   --   --   --   --   --   LABPROT 14.8  --   --   --   --   --   --   --   INR 1.2  --   --   --   --   --   --   --   HEPARINUNFRC  --   --  0.79*   < > 0.41  --  0.45 0.44  CREATININE  --  1.99* 1.69*  --   --   --   --  1.79*   < > = values in this interval not displayed.     Estimated Creatinine Clearance: 24.4 mL/min (A) (by C-G formula based on SCr of 1.79 mg/dL (H)).   Medical History: Past Medical History:  Diagnosis Date   Anemia    Anemia, unspecified    Hgb 11.8 - 04/2013   Arthritis    Cataract    Bilateral   CHF (congestive  heart failure) (HCC)    Chronic kidney disease    COPD (chronic obstructive pulmonary disease) (HCC)    DDD (degenerative disc disease), lumbar    Degenerative arthritis    Diabetes mellitus type 2, uncomplicated (HCC)    AODM (A1C 6.9%) 04/2013   Diabetes mellitus without complication (HCC)    Dyspnea    with exertion   Esophagitis    Gout    Uric acid 5.4 - 08/2012   Hypertension    Obesity, unspecified    Sarcoidosis, lung ([place])    reported by pt, Clinically without a biopsy   Sleep apnea    OSA--Use C-PAP   Assessment: 81yo female with PMH COPD, CHF, DDD, type 2 diabetes mellitus, hypertension, sarcoidosis, gout admitted for upper extremity DVT involving the subclavian, axillary, and brachial veins. Pharmacy consulted for heparin dosing and no chronic anticoagulation from chart review. Thrombectomy completed on 10/12.  Heparin Dosing Weight: 66.4 kg  Date Time aPTT/HL Rate/Comment 10/11 0703 0.79  Supratherapeutic; 1100 >> 1050 un/hr 10/11 1646 0.47  Therapeutic x 1; 1050 un/hr  10/12   0102   0.41                 Therapeutic X 2 10/12 2248   0.45                 Therapeutic X 3   10/13 0647 0.44  Therapeutic Baseline Labs: aPTT - 31; INR - 1.2 Hgb - 9.7; Plts - 360  Goal of Therapy:  Heparin level 0.3-0.7 units/ml Monitor platelets by anticoagulation protocol: Yes   Plan: 10/13: Anti-Xa level is therapeutic continue heparin infusion at 1050 units/hr. Patient will transition to apixaban today @ 1000 Continue to monitor H&H and platelets daily while on heparin gtt.  Lorin Picket 10/24/2021 8:06 AM

## 2021-10-24 NOTE — Progress Notes (Signed)
ID Pt doing better Tolerated 2 doses of amoxicillin challenge dose O/e awake and alert Patient Vitals for the past 24 hrs:  BP Temp Pulse Resp SpO2  10/24/21 1225 104/71 98 F (36.7 C) 71 -- 100 %  10/24/21 0820 (!) 158/87 99.1 F (37.3 C) (!) 53 20 96 %  10/24/21 0458 (!) 143/74 99 F (37.2 C) 91 16 94 %  10/24/21 0037 (!) 143/62 99.5 F (37.5 C) 67 16 97 %     Chest b/l air entry [id number] Abd soft Cns non focal Rt hip surgical site looks fine Rt arm- swelling less    Labs    Latest Ref Rng & Units 10/24/2021    6:47 AM 10/23/2021    5:08 AM 10/22/2021    7:03 AM  CBC  WBC 4.0 - 10.5 K/uL 5.8  4.9  5.0   Hemoglobin 12.0 - 15.0 g/dL 9.1  8.9  8.7   Hematocrit 36.0 - 46.0 % 27.8  27.6  26.6   Platelets 150 - 400 K/uL 333  332  328         Latest Ref Rng & Units 10/24/2021    6:47 AM 10/22/2021    7:03 AM 10/22/2021    1:17 AM  CMP  Glucose 70 - 99 mg/dL 75  83  58   BUN 8 - 23 mg/dL 37  33  36   Creatinine 0.44 - 1.00 mg/dL 1.79  1.69  1.99   Sodium 135 - 145 mmol/L 137  139  141   Potassium 3.5 - 5.1 mmol/L 4.1  3.5  3.6   Chloride 98 - 111 mmol/L 111  114  112   CO2 22 - 32 mmol/L 21  19  23    Calcium 8.9 - 10.3 mg/dL 8.8  8.1  9.2   Total Protein 6.5 - 8.1 g/dL   7.1   Total Bilirubin 0.3 - 1.2 mg/dL   0.5   Alkaline Phos 38 - 126 U/L   118   AST 15 - 41 U/L   28   ALT 0 - 44 U/L   21      Impression/recommendation  Rt arm deep vein thrombosis of axillary, subclavian and brachial- s/p thrombectomy- on anticoagulation Swelling much better  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

## 2021-10-24 NOTE — TOC Transition Note (Signed)
Transition of Care [facility]) - CM/SW Discharge Note   Patient Details  Name: Mackenzie Hill MRN: [id number] Date of Birth: 11-01-1940  [facility] CM/SW Contact:  Mackenzie Slimmer, RN Phone Number: 10/24/2021, 2:59 PM   Clinical Narrative:    Spoke with patient regarding discharge plan. Patient lives with her son. She obtains her medications from [employer]. She denies any difficulty with obtaining her medications. Her niece will transport  her home. She denied any DME needs. She currently has a cane and walker. Per Mackenzie Hill at [place] she is active for PT and Therapist, sports. TOC signing off.    Final next level of care: Mackenzie Hill Barriers to Discharge: Barriers Resolved   Patient Goals and CMS Choice Patient states their goals for this hospitalization and ongoing recovery are:: To return home CMS Medicare.gov Compare Post Acute Care list provided to:: Patient    Discharge Placement                       Discharge Plan and Services     Post Acute Care Choice: Home Health                        Date [place]: 10/23/21 Time HH Agency Contacted: 1200 Representative spoke with at [place]: [facility]) Interventions     Readmission Risk Interventions    10/24/2021    2:55 PM  Readmission Risk Prevention Plan  Transportation Screening Complete  PCP or Specialist Appt within 3-5 Days Complete  HRI or [place] Complete  Social Work Consult for [place] Planning/Counseling Complete  Palliative Care Screening Not Applicable  Medication Review [employer]) Complete

## 2021-10-24 NOTE — Progress Notes (Signed)
Discharge instructions reviewed with patient including followup visits and new medications.  Understanding was verbalized and all questions were answered.  IV removed without complication; patient tolerated well.  Patient discharged home via wheelchair in stable condition escorted by volunteer staff.  

## 2021-10-24 NOTE — Discharge Summary (Signed)
Physician Discharge Summary   Patient: Mackenzie Hill MRN: [id number] DOB: 02-May-1940  Admit date:     10/21/2021  Discharge date: 10/24/21  Discharge Physician: Loletha Grayer   PCP: Idelle Crouch, MD   Recommendations at discharge:   Follow-up PCP 5 days Follow-up Dr. Delaine Lame 2 weeks Follow-up Dr. 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  Sent in for right upper extremity swelling and found to have a DVT secondary to PICC line.  PICC line was removed.  Patient was started on heparin drip.  Dr. Delana Meyer took to the vascular lab on 10/23/2021 and did a mechanical thrombectomy of the right brachial vein, axillary vein and subclavian vein and a percutaneous transluminal angioplasty of the right subclavian vein.  The patient was switched over to Eliquis on 10/24/2021.  The patient also has been treated for actinomyces prosthetic hip infection and finished up her course of IV ertapenem here in the hospital.  Dr. Delaine Lame did give test doses of amoxicillin here in the hospital and she tolerated them.  Patient discharged home with 1 month supply of amoxicillin.  Patient will follow-up with Dr. Delaine Lame in 2 weeks.  Assessment and Plan: * Acute deep vein thrombosis (DVT) of right upper extremity [facility]) Patient had thrombectomy and angioplasty on 10/23/2021 by Dr. Delana Meyer.  Patient initially on heparin drip  and then converted over to  Eliquis on 10/24/2021.  Sinus bradycardia Patient not on any rate controlling medications.  Likely sleep apnea as a cause of bradycardia overnight  Type 2 diabetes mellitus with hypoglycemia (HCC) Hemoglobin A1c on the lower side at 5.8.  During the hospital course I did give D5 drip secondary to low sugar and being n.p.o. for procedure.  Will discontinue Glucotrol XL upon discharge.  He got back on Januvia as outpatient.  Infection of prosthetic hip joint [facility]) Patient completed the last couple doses of ertapenem while here.  The patient had test doses of amoxicillin here and tolerated this.  Case discussed with Dr. Steva Ready infectious disease and she recommended another month of amoxicillin orally.  She will follow-up in 2 weeks.  Essential hypertension Continue her antihypertensives..  Diastolic dysfunction Continue Januvia and Aldactone.  Chronic obstructive pulmonary disease (COPD) (HCC) Continue her inhalers.  Obesity With current height and weight in computer BMI now down to 29.41  GERD without esophagitis continue PPI therapy  Gout continue allopurinol.  CKD (chronic kidney disease), stage III (HCC) CKD stage IIIb creatinine 1.79 upon discharge  OSA on CPAP CPAP at night.  Anemia of chronic disease Last hemoglobin 9.1.  With ferritin of 140 this goes along with anemia of chronic disease         Consultants: Vascular surgery, infectious disease Procedures performed: Thrombectomy and angioplasty Disposition: Home Diet recommendation:  Cardiac and Carb modified diet DISCHARGE MEDICATION: Allergies as of 10/24/2021       Reactions   Hydralazine Itching   Meloxicam  Other (See Comments)   Causes excessive sleepiness fainting   Penicillins Hives, Itching, Other (See Comments)   Whelps Has patient had a PCN reaction causing immediate rash, facial/tongue/throat swelling, SOB or lightheadedness with hypotension: Yes Has patient had a PCN reaction causing  severe rash involving mucus membranes or skin necrosis: No Has patient had a PCN reaction that required hospitalization: No Has patient had a PCN reaction occurring within the last 10 years: No If all of the above answers are "NO", then may proceed with Cephalosporin use.   Pioglitazone Itching   Tramadol Other (See Comments)   Causes excessive sleepiness Dizzy/fainting        Medication List     STOP taking these medications    aspirin EC 81 MG tablet   Chlorhexidine Gluconate Cloth 2 % Pads   doxycycline 100 MG tablet Commonly known as: ADOXA   enoxaparin 40 MG/0.4ML injection Commonly known as: LOVENOX   glipiZIDE 10 MG 24 hr tablet Commonly known as: GLUCOTROL XL   levocetirizine 5 MG tablet Commonly known as: XYZAL   methocarbamol 500 MG tablet Commonly known as: ROBAXIN   sodium chloride flush 0.9 % Soln Commonly known as: NS   Uloric 40 MG tablet Generic drug: febuxostat       TAKE these medications    albuterol 108 (90 Base) MCG/ACT inhaler Commonly known as: VENTOLIN HFA Inhale 2 puffs into the lungs every 6 (six) hours as needed for wheezing or shortness of breath.   allopurinol 100 MG tablet Commonly known as: ZYLOPRIM Take by mouth.   amLODipine 5 MG tablet Commonly known as: NORVASC Take 5 mg by mouth daily.   amoxicillin 500 MG capsule Commonly known as: AMOXIL Take 1 capsule (500 mg total) by mouth 2 (two) times daily for 28 days.   apixaban 5 MG Tabs tablet Commonly known as: ELIQUIS Two tabs po twice a day for seven days then 1 tab po twice a day afterwards   ascorbic acid 500 MG tablet Commonly known as: VITAMIN C Take 500 mg by mouth daily.   budesonide-formoterol 160-4.5 MCG/ACT inhaler Commonly known as: SYMBICORT Inhale 2 puffs into the lungs 2 (two) times daily.   Calcium 600/Vitamin D 600-400 MG-UNIT Tabs Generic drug: Calcium Carbonate-Vitamin D3 Take 2 tablets by mouth daily.   cetirizine 10 MG tablet Commonly  known as: ZYRTEC Take 10 mg by mouth at bedtime.   cyanocobalamin 1000 MCG tablet Commonly known as: VITAMIN B12 Take 1,000 mcg by mouth daily.   diclofenac sodium 1 % Gel Commonly known as: VOLTAREN Apply topically 2 (two) times daily as needed (pain).   docusate sodium 100 MG capsule Commonly known as: COLACE Take 1 capsule (100 mg total) by mouth 2 (two) times daily.   fexofenadine 180 MG tablet Commonly known as: ALLEGRA Take 180 mg by mouth daily.   ipratropium-albuterol 0.5-2.5 (3) MG/3ML Soln Commonly known as: DUONEB Take 3 mLs by nebulization every 6 (six) hours as needed (shortness of breath or wheezing).   isosorbide mononitrate 60 MG 24 hr tablet Commonly known as: IMDUR Take 180 mg by mouth daily.   losartan 50 MG tablet Commonly known as: COZAAR Take 100 mg by mouth daily.   magnesium hydroxide 400 MG/5ML suspension Commonly known as: MILK OF MAGNESIA Take 30 mLs by mouth every 4 (four) hours as needed. If constipation/ no BM for 2 days   mometasone 50 MCG/ACT nasal spray Commonly known as: NASONEX Place 2 sprays into the nose daily as  needed (allergies).   montelukast 10 MG tablet Commonly known as: SINGULAIR Take 10 mg by mouth every evening.   omeprazole 20 MG capsule Commonly known as: PRILOSEC   oxybutynin 5 MG tablet Commonly known as: DITROPAN   oxyCODONE 5 MG immediate release tablet Commonly known as: Oxy IR/ROXICODONE Take 1-2 tablets (5-10 mg total) by mouth every 4 (four) hours as needed for moderate pain (pain score 4-6).   sitaGLIPtin 50 MG tablet Commonly known as: JANUVIA Take 50 mg by mouth daily.   spironolactone 25 MG tablet Commonly known as: ALDACTONE Take 25 mg by mouth daily.   tiotropium 18 MCG inhalation capsule Commonly known as: SPIRIVA Place 18 mcg into inhaler and inhale daily.        Follow-up Information     Idelle Crouch, MD. Go on 10/29/2021.   Specialty: Internal Medicine Why: @10 :30am Contact  information: [place]  [phone] [phone]                Discharge Exam: Danley Danker Weights   10/21/21 1543 10/23/21 1219  Weight: 75.3 kg 75.3 kg   Physical Exam HENT:     Head: Normocephalic.     Mouth/Throat:     Pharynx: No oropharyngeal exudate.  Eyes:     General: Lids are normal.     Conjunctiva/sclera: Conjunctivae normal.  Cardiovascular:     Rate and Rhythm: Normal rate and regular rhythm.     Heart sounds: Normal heart sounds, S1 normal and S2 normal.  Pulmonary:     Breath sounds: No decreased breath sounds, wheezing, rhonchi or rales.  Abdominal:     Palpations: Abdomen is soft.     Tenderness: There is no abdominal tenderness.  Musculoskeletal:     Right forearm: Swelling present.     Right lower leg: Swelling present.     Left lower leg: Swelling present.  Skin:    General: Skin is warm.     Findings: No rash.  Neurological:     Mental Status: She is alert and oriented to person, place, and time.      Condition at discharge: stable  The results of significant diagnostics from this hospitalization (including imaging, microbiology, ancillary and laboratory) are listed below for reference.   Imaging Studies: PERIPHERAL VASCULAR CATHETERIZATION  Result Date: 10/23/2021 See surgical note for result.  US Venous Img Upper Uni Right(DVT)  Result Date: 10/21/2021 CLINICAL DATA:  Right upper extremity swelling 6 days after PICC line EXAM: RIGHT UPPER EXTREMITY VENOUS DOPPLER ULTRASOUND TECHNIQUE: Gray-scale sonography with graded compression, as well as color Doppler and duplex ultrasound were performed to evaluate the upper extremity deep venous system from the level of the subclavian vein and including the jugular, axillary, basilic, radial, ulnar and upper cephalic vein. Spectral Doppler was utilized to evaluate flow at rest and with distal augmentation maneuvers. COMPARISON:  None Available. FINDINGS: Contralateral Subclavian  Vein: Respiratory phasicity is normal and symmetric with the symptomatic side. No evidence of thrombus. Normal compressibility. Internal Jugular Vein: No evidence of thrombus. Normal compressibility, respiratory phasicity and response to augmentation. Subclavian Vein: Hypoechoic intraluminal thrombus. Thrombus appears occlusive. PICC line visualized. Axillary Vein: Similar acute appearing hypoechoic thrombus. Thrombus appears occlusive. Vessel noncompressible. Cephalic Vein: No evidence of thrombus. Normal compressibility, respiratory phasicity and response to augmentation. Basilic Vein: No evidence of thrombus. Normal compressibility, respiratory phasicity and response to augmentation. Brachial Veins: Mixed echogenicity thrombus. Vessel is partially compressible. Radial Veins: No evidence of thrombus. Normal compressibility, respiratory  phasicity and response to augmentation. Ulnar Veins: No evidence of thrombus. Normal compressibility, respiratory phasicity and response to augmentation. IMPRESSION: Positive exam for acute appearing right upper extremity DVT involving the subclavian, axillary, and brachial veins. Electronically Signed   By: Jerilynn Mages.  Shick M.D.   On: 10/21/2021 14:14   [place] EKG SITE RITE  Result Date: 10/09/2021 If Site Rite image not attached, placement could not be confirmed due to current cardiac rhythm.   Microbiology: Results for orders placed or performed during the hospital encounter of 09/04/21  Surgical pcr screen     Status: None   Collection Time: 09/04/21 11:34 AM   Specimen: Nasal Mucosa; Nasal Swab  Result Value Ref Range Status   MRSA, PCR NEGATIVE NEGATIVE Final   Staphylococcus aureus NEGATIVE NEGATIVE Final    Comment: (NOTE) The Xpert SA Assay (FDA approved for NASAL specimens in patients 7 years of age and older), is one component of a comprehensive surveillance program. It is not intended to diagnose infection nor to guide or monitor treatment. Performed at [facility], [address] [place], [place] [address]   Aerobic/Anaerobic Culture w Gram Stain (surgical/deep wound)     Status: None   Collection Time: 09/04/21  2:13 PM   Specimen: PATH Other; Tissue  Result Value Ref Range Status   Specimen Description   Final    WOUND Performed at [facility], [address] [place], [place] [address]    Special Requests JOINT CAPSULE  Final   Gram Stain NO WBC SEEN NO ORGANISMS SEEN   Final   Culture   Final    RARE ACTINOMYCES NEUII Standardized susceptibility testing for this organism is not available. NO ANAEROBES ISOLATED Performed at [facility], [place] [address], [place], [place] [address]    Report Status 09/09/2021 FINAL  Final  Aerobic/Anaerobic Culture w Gram Stain (surgical/deep wound)     Status: None   Collection Time: 09/04/21  2:13 PM   Specimen: PATH Other; Body Fluid  Result Value Ref Range Status   Specimen Description   Final    WOUND Performed at [facility], [address] [place], [place] [address]    Special Requests MIDDLE LAYER WOUND  Final   Gram Stain NO WBC SEEN NO ORGANISMS SEEN   Final   Culture   Final    RARE ACTINOMYCES NEUII Standardized susceptibility testing for this organism is not available. NO ANAEROBES ISOLATED Performed at [facility], [place] [address] [place], [place] [address]    Report Status 09/09/2021 FINAL  Final  Aerobic/Anaerobic Culture w Gram Stain (surgical/deep wound)     Status: None   Collection Time: 09/04/21  2:18 PM   Specimen: PATH Other; Body Fluid  Result Value Ref Range Status   Specimen Description   Final    WOUND Performed at [facility], [address] [place], [place] [address]    Special Requests MIDDLE LAYER WOUND  Final   Gram Stain   Final    RARE WBC PRESENT,BOTH PMN AND MONONUCLEAR NO ORGANISMS SEEN    Culture   Final    RARE ACTINOMYCES NEUII Standardized susceptibility testing for this organism is  not available. NO ANAEROBES ISOLATED Performed at [facility], [place] [address], [place],  [address]    Report Status 09/09/2021 FINAL  Final  Aerobic/Anaerobic Culture w Gram Stain (surgical/deep wound)     Status: None   Collection Time: 09/04/21  2:18  PM   Specimen: PATH Other; Tissue  Result Value Ref Range Status   Specimen Description   Final    WOUND Performed at [facility], [address] [place], [place] [address]    Special Requests MIDDLE LAYER WOUND FLUID  Final   Gram Stain NO WBC SEEN NO ORGANISMS SEEN   Final   Culture   Final    RARE ACTINOMYCES NEUII Standardized susceptibility testing for this organism is not available. NO ANAEROBES ISOLATED Performed at [facility], [place] [address], [place], [place] [address]    Report Status 09/09/2021 FINAL  Final  Aerobic/Anaerobic Culture w Gram Stain (surgical/deep wound)     Status: None   Collection Time: 09/04/21  2:26 PM   Specimen: PATH Other; Body Fluid  Result Value Ref Range Status   Specimen Description   Final    WOUND Performed at [facility], [address] [place], [place] [address]    Special Requests SUPERFICIAL WOUND FLUID  Final   Gram Stain NO WBC SEEN NO ORGANISMS SEEN   Final   Culture   Final    RARE ACTINOMYCES NEUII Standardized susceptibility testing for this organism is not available. NO ANAEROBES ISOLATED Performed at [facility], [place] [address], [place], [place] [address]    Report Status 09/09/2021 FINAL  Final  Aerobic/Anaerobic Culture w Gram Stain (surgical/deep wound)     Status: None   Collection Time: 09/04/21  2:37 PM   Specimen: PATH Other; Tissue  Result Value Ref Range Status   Specimen Description   Final    WOUND Performed at [facility], [address] [place], [place] [address]    Special Requests SUPERFICIAL  Final   Gram Stain   Final    FEW WBC PRESENT, PREDOMINANTLY PMN NO ORGANISMS SEEN    Culture    Final    RARE ACTINOMYCES NEUII Standardized susceptibility testing for this organism is not available. NO ANAEROBES ISOLATED Performed at [facility], [place] [address], [place],  [address]    Report Status 09/09/2021 FINAL  Final    Labs: CBC: Recent Labs  Lab 10/21/21 1341 10/22/21 0117 10/22/21 0703 10/23/21 0508 10/24/21 0647  WBC 7.4 7.4 5.0 4.9 5.8  NEUTROABS 5.5 4.4  --   --   --   HGB 9.7* 9.4* 8.7* 8.9* 9.1*  HCT 29.5* 29.4* 26.6* 27.6* 27.8*  MCV 93.7 95.1 94.0 94.8 93.3  PLT 360 346 328 332 578   Basic Metabolic Panel: Recent Labs  Lab 10/21/21 1341 10/22/21 0117 10/22/21 0703 10/24/21 0647  NA 136 141 139 137  K 4.1 3.6 3.5 4.1  CL 108 112* 114* 111  CO2 21* 23 19* 21*  GLUCOSE 189* 58* 83 75  BUN 38* 36* 33* 37*  CREATININE 2.28* 1.99* 1.69* 1.79*  CALCIUM 9.3 9.2 8.1* 8.8*   Liver Function Tests: Recent Labs  Lab 10/21/21 1341 10/22/21 0117  AST 26 28  ALT 22 21  ALKPHOS 134* 118  BILITOT 0.6 0.5  PROT 7.9 7.1  ALBUMIN 3.5 3.2*   CBG: Recent Labs  Lab 10/23/21 1217 10/23/21 1400 10/23/21 2123 10/24/21 0821 10/24/21 1226  GLUCAP 111* 129* 123* 81 195*    Discharge time spent: greater than 30 minutes.  Signed: Loletha Grayer, MD [facility] 10/24/2021

## 2021-10-24 NOTE — Progress Notes (Signed)
Amoxil solution given as ordered.  This RN stayed in room with patient to monitor for 30 minutes post dose.  Patient tolerated well with no adverse effects.

## 2021-10-24 NOTE — Care Management Important Message (Signed)
Important Message  Patient Details  Name: MAYRE BURY MRN: [id number] Date of Birth: [birth year]   Medicare Important Message Given:  Yes     Juliann Pulse A Hadley Detloff 10/24/2021, 12:34 PM

## 2021-10-24 NOTE — Plan of Care (Signed)

## 2021-10-24 NOTE — Evaluation (Signed)
Clinical/Bedside Swallow Evaluation Patient Details  Name: Mackenzie Hill MRN: [id number] Date of Birth: May 21, 1940  Today's Date: 10/24/2021 Time: SLP Start Time (ACUTE ONLY): 0845 SLP Stop Time (ACUTE ONLY): 6967 SLP Time Calculation (min) (ACUTE ONLY): 40 min  Past Medical History:  Past Medical History:  Diagnosis Date   Anemia    Anemia, unspecified    Hgb 11.8 - 04/2013   Arthritis    Cataract    Bilateral   CHF (congestive heart failure) (HCC)    Chronic kidney disease    COPD (chronic obstructive pulmonary disease) (HCC)    DDD (degenerative disc disease), lumbar    Degenerative arthritis    Diabetes mellitus type 2, uncomplicated ([place])    AODM (A1C 6.9%) 04/2013   Diabetes mellitus without complication (HCC)    Dyspnea    with exertion   Esophagitis    Gout    Uric acid 5.4 - 08/2012   Hypertension    Obesity, unspecified    Sarcoidosis, lung ([place])    reported by pt, Clinically without a biopsy   Sleep apnea    OSA--Use C-PAP   Past Surgical History:  Past Surgical History:  Procedure Laterality Date   ABDOMINAL HYSTERECTOMY     age 37   ANTERIOR HIP REVISION Right 09/04/2021   Procedure: ANTERIOR HIP REVISION;  Surgeon: Hessie Knows, MD;  Location: [facility];  Service: Orthopedics;  Laterality: Right;   APPLICATION OF WOUND VAC Right 09/04/2021   Procedure: APPLICATION OF WOUND VAC;  Surgeon: Hessie Knows, MD;  Location: [facility];  Service: Orthopedics;  Laterality: Right;   CATARACT EXTRACTION Bilateral    COLONOSCOPY  11/02/2005   Hyperplastic Polyp: CBF 10/2015; Recall Ltr mailed 08/27/2015 (dw)   FOOT SURGERY Right    HALLUX VALGUS REPAIR Right    OOPHORECTOMY     PERIPHERAL VASCULAR THROMBECTOMY Right 10/23/2021   Procedure: PERIPHERAL VASCULAR THROMBECTOMY;  Surgeon: Katha Cabal, MD;  Location: [place] [facility];  Service: Cardiovascular;  Laterality: Right;   TOTAL HIP ARTHROPLASTY 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   Procedure: TOTAL HIP ARTHROPLASTY  ANTERIOR APPROACH;  Surgeon: Hessie Knows, MD;  Location: [facility];  Service: Orthopedics;  Laterality: Right;   HPI:  Pt is a 81 y.o. African-American female with medical history significant for COPD, CHF, DDD, type 2 diabetes mellitus, Esophagitis, hypertension and sarcoidosis as well as gout, who presented to the emergency room with acute onset of worsening right upper extremity swelling with associated mild discomfort.  The patient had a right THA in 2018 and was recently admitted for infected prosthesis and managed with IV antibiotics here for a few days followed by outpatient IV antibiotic therapy via PICC line.  At admit, dx of Acute deep vein thrombosis (DVT) of right upper extremity.  Patient had thrombectomy and angioplasty, Vascular Surgery following.  No CXR.    Assessment / Plan / Recommendation  Clinical Impression   Pt seen for BSE this morning. Pt awake, verbal and talking on phone w/ family upon entering room and finishing her Breakfast meal.   On RA, afebrile.   Pt appears to present w/ adequate oropharyngeal phase swallow function w/ No oropharyngeal phase dysphagia noted, No neuromuscular deficits noted. Pt consumed po trials w/ No overt, clinical s/s of aspiration during po trials. Pt appears at reduced risk for aspiration following general aspiration precautions.   Pt describes mild/general weakness, and also intermittent s/s of potential REFLUX activity. Pt has a h/o Esophagitis per chart. Pt endorsed no prominent  globus feelings w/ meal, but put her hand to her sternal notch area x2 when talking about swallowing - pt endorsed intermittent difficulty swallowing larger pills.  These factors can increase risk for dysphagia as well as risk for aspiration of REFLUX material during any Esophageal dysmotility. Recommend f/u w/ GI for further discussion/management and tx if needed.   During po trials, pt consumed all consistencies w/ no overt coughing, decline in vocal quality, or  change in respiratory presentation during/post trials. No decline in O2 sats, 98%. Oral phase appeared [facility] w/ timely bolus management, mastication, and control of bolus propulsion for A-P transfer for swallowing. Oral clearing achieved w/ all trial consistencies -- moistened, soft foods given. Pt stated she could use more syrup for pancakes d/t being "dry".  OM Exam appeared [facility] w/ no unilateral weakness noted. Speech Clear. Pt fed self w/ setup support.  Recommend continue a Regular consistency diet w/ well-Cut meats, moistened foods; Thin liquids. Recommend general aspiration and REFLUX precautions, alternate foods/liquids to aid motility. Pills WHOLE in Puree for safer, easier swallowing as desired.  Education given on Pills in Puree; food consistencies/prep and easy to eat options; general aspiration and Reflux precautions to pt. NSG to reconsult if any new needs arise. NSG updated, agreed. MD updated. Recommend Dietician f/u for support if needed. SLP Visit Diagnosis: Dysphagia, unspecified (R13.10)    Aspiration Risk   (reduced following general aspiration and Reflux precautions)    Diet Recommendation   Regular consistency diet w/ well-Cut meats, moistened foods; Thin liquids. Recommend general aspiration and REFLUX precautions, alternate foods/liquids to aid motility.   Medication Administration: Whole meds with puree (if needed for ease of swallowing, esp. larger pills)    Other  Recommendations Recommended Consults: Consider GI evaluation (if any further s/s of Reflux) Oral Care Recommendations: Oral care BID;Patient independent with oral care Other Recommendations:  (n/a)    Recommendations for follow up therapy are one component of a multi-disciplinary discharge planning process, led by the attending physician.  Recommendations may be updated based on patient status, additional functional criteria and insurance authorization.  Follow up Recommendations No SLP follow up       Assistance Recommended at Discharge None  Functional Status Assessment Patient has not had a recent decline in their functional status (no swallowing deficits)  Frequency and Duration  (n/a)   (n/a)       Prognosis Prognosis for Safe Diet Advancement: Good Barriers to Reach Goals: Time post onset;Severity of deficits      Swallow Study   General Date of Onset: 10/21/21 HPI: Pt is a 81 y.o. African-American female with medical history significant for COPD, CHF, DDD, type 2 diabetes mellitus, Esophagitis, hypertension and sarcoidosis as well as gout, who presented to the emergency room with acute onset of worsening right upper extremity swelling with associated mild discomfort.  The patient had a right THA in 2018 and was recently admitted for infected prosthesis and managed with IV antibiotics here for a few days followed by outpatient IV antibiotic therapy via PICC line.  At admit, dx of Acute deep vein thrombosis (DVT) of right upper extremity.  Patient had thrombectomy and angioplasty, Vascular Surgery following.  No CXR. Type of Study: Bedside Swallow Evaluation Previous Swallow Assessment: none Diet Prior to this Study: Regular;Thin liquids Temperature Spikes Noted: No (wbc 5.8) Respiratory Status: Room air History of Recent Intubation: No Behavior/Cognition: Alert;Cooperative;Pleasant mood Oral Cavity Assessment: Within Functional Limits Oral Care Completed by SLP: Recent completion by staff Oral  Cavity - Dentition: Adequate natural dentition Vision: Functional for self-feeding Self-Feeding Abilities: Able to feed self Patient Positioning: Upright in bed (needed min support for full upright sitting) Baseline Vocal Quality: Normal Volitional Cough: Strong Volitional Swallow: Able to elicit    Oral/Motor/Sensory Function Overall Oral Motor/Sensory Function: Within functional limits   Ice Chips Ice chips: Not tested   Thin Liquid Thin Liquid: Within functional  limits Presentation: Self Fed;Straw (few ozs total) Other Comments: water, juice    Nectar Thick Nectar Thick Liquid: Not tested   Honey Thick Honey Thick Liquid: Not tested   Puree Puree: Within functional limits Presentation: Spoon;Self Fed   Solid     Solid: Within functional limits Presentation: Self Fed;Spoon (pancakes w/ breakfast meal)         Orinda Kenner, MS, CCC-SLP Speech Language Pathologist Rehab Services; [place] [phone] (ascom) Anquanette Bahner 10/24/2021,9:42 AM

## 2021-11-03 ENCOUNTER — Encounter: Payer: Self-pay | Admitting: Podiatry

## 2021-11-03 ENCOUNTER — Ambulatory Visit: Payer: Medicare HMO | Admitting: Podiatry

## 2021-11-03 DIAGNOSIS — E1151 Type 2 diabetes mellitus with diabetic peripheral angiopathy without gangrene: Secondary | ICD-10-CM

## 2021-11-03 DIAGNOSIS — M79675 Pain in left toe(s): Secondary | ICD-10-CM

## 2021-11-03 DIAGNOSIS — B351 Tinea unguium: Secondary | ICD-10-CM

## 2021-11-03 DIAGNOSIS — M79674 Pain in right toe(s): Secondary | ICD-10-CM | POA: Diagnosis not present

## 2021-11-03 DIAGNOSIS — D689 Coagulation defect, unspecified: Secondary | ICD-10-CM | POA: Diagnosis not present

## 2021-11-03 DIAGNOSIS — N184 Chronic kidney disease, stage 4 (severe): Secondary | ICD-10-CM

## 2021-11-03 NOTE — Progress Notes (Signed)
This patient presents to my office for at risk foot care.  This patient requires this care by a professional since this patient will be at risk due to having diabetes and CKD. Patient is also coagulation defect due to eliquis.This patient is unable to cut nails herself since the patient cannot reach her nails.These nails are painful walking and wearing shoes.  This patient presents for at risk foot care today.  General Appearance  Alert, conversant and in no acute stress.  Vascular  Dorsalis pedis are  weakly palpable  bilaterally.  Posterior tibial pulses are absent  B/L. Capillary return is within normal limits  bilaterally. Temperature is within normal limits  bilaterally.  Neurologic  Senn-Weinstein monofilament wire test within normal limits  bilaterally. Muscle power within normal limits bilaterally.  Nails Thick disfigured discolored nails with subungual debris  from hallux to fifth toes bilaterally. No evidence of bacterial infection or drainage bilaterally.  Orthopedic  No limitations of motion  feet .  No crepitus or effusions noted.  No bony pathology or digital deformities noted.  Skin  normotropic skin with no porokeratosis noted bilaterally.  No signs of infections or ulcers noted.     Onychomycosis  Pain in right toes  Pain in left toes  Consent was obtained for treatment procedures.   Mechanical debridement of nails 1-5  bilaterally performed with a nail nipper.  Filed with dremel without incident.    Return office visit     3 months                 Told patient to return for periodic foot care and evaluation due to potential at risk complications.   Gardiner Barefoot DPM

## 2021-11-04 ENCOUNTER — Ambulatory Visit: Payer: Medicare HMO | Attending: Infectious Diseases | Admitting: Infectious Diseases

## 2021-11-04 DIAGNOSIS — Z20822 Contact with and (suspected) exposure to covid-19: Secondary | ICD-10-CM | POA: Diagnosis present

## 2021-11-04 DIAGNOSIS — Z86718 Personal history of other venous thrombosis and embolism: Secondary | ICD-10-CM | POA: Diagnosis not present

## 2021-11-04 DIAGNOSIS — T8451XD Infection and inflammatory reaction due to internal right hip prosthesis, subsequent encounter: Secondary | ICD-10-CM | POA: Diagnosis not present

## 2021-11-04 DIAGNOSIS — I82A11 Acute embolism and thrombosis of right axillary vein: Secondary | ICD-10-CM | POA: Diagnosis not present

## 2021-11-04 DIAGNOSIS — T8450XD Infection and inflammatory reaction due to unspecified internal joint prosthesis, subsequent encounter: Secondary | ICD-10-CM

## 2021-11-04 DIAGNOSIS — T8451XA Infection and inflammatory reaction due to internal right hip prosthesis, initial encounter: Secondary | ICD-10-CM | POA: Insufficient documentation

## 2021-11-04 NOTE — Progress Notes (Signed)
The purpose of this virtual visit is to provide medical care while limiting exposure to the novel coronavirus (COVID19) for both patient and office staff.   Consent was obtained for phone visit:  Yes.   Answered questions that patient had about telehealth interaction:  Yes.   I discussed the limitations, risks, security and privacy concerns of performing an evaluation and management service by telephone. I also discussed with the patient that there may be a patient responsible charge related to this service. The patient expressed understanding and agreed to proceed.   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 S/p I/D and ant hip revision on 09/04/21 Culture X4 positive for actinomyces Initially started ceftriaxone IV and was stopped after 3 weeks due to abnormal LFTS and changed to ertapenem which she completed on 10/23/21 She has been placed on PO amoxicillin for 2 more months since then- until 12/23/21  PCN allergy ? Pt tolerating amoxicillin very well- no ithcing, rash, joint pain No diarrhea, no nausea or vomiting Had some muscle soreness yesterday but none today  CKD- labs done on 10/29/21 at PCP - cr 1.9 - pretty stable  Amoxicillin is 500mg  Twice a day  Discussed the management with the patient in detail- total time spent on this call 15 min

## 2021-11-12 ENCOUNTER — Encounter: Payer: Self-pay | Admitting: Infectious Diseases

## 2021-11-17 ENCOUNTER — Ambulatory Visit (INDEPENDENT_AMBULATORY_CARE_PROVIDER_SITE_OTHER): Payer: Medicare HMO | Admitting: Vascular Surgery

## 2021-11-19 ENCOUNTER — Telehealth: Payer: Self-pay

## 2021-11-19 DIAGNOSIS — T8450XD Infection and inflammatory reaction due to unspecified internal joint prosthesis, subsequent encounter: Secondary | ICD-10-CM

## 2021-11-19 MED ORDER — AMOXICILLIN 500 MG PO CAPS: 500.0000 mg | ORAL_CAPSULE | Freq: Two times a day (BID) | ORAL | 0 refills | Status: DC

## 2021-11-19 NOTE — Telephone Encounter (Signed)
Patient called requesting refills of amoxicillin and Eliquis. Advised her that Dr. Gwenevere Ghazi last note mentions that she wants her on amoxicillin until 12/23/21, will send in refill.   Explained that she will need to contact her PCP or cardiologist about Eliquis refills.   Beryle Flock, RN

## 2021-11-23 NOTE — Progress Notes (Unsigned)
MRN : [id number]  CATLIN DORIA is a 81 y.o. (1940/03/01) female who presents with chief complaint of legs hurt and swell.  History of Present Illness:   Procedure 10/23/2021:  Mechanical thrombectomy of the right brachial vein, axillary vein and subclavian vein 2.    Percutaneous transluminal angioplasty of the right subclavian vein to 8 mm.  She presented to [facility] 10/22/2021 for right upper extremity arm swelling.  She had an infected prosthesis and was managed with IV antibiotics via PICC line.  She is followed by infectious disease who noted that her right upper extremity was markedly swollen and non invasive studies revealed RUE DVT involving the subclavian, axillary and brachial veins.   She notes since the procedure her arm has been feeling much better.  She denies pain.  She denies any significant swelling.  No outpatient medications have been marked as taking for the 11/24/21 encounter (Appointment) with Delana Meyer, Dolores Lory, MD.    Past Medical History:  Diagnosis Date   Anemia    Anemia, unspecified    Hgb 11.8 - 04/2013   Arthritis    Cataract    Bilateral   CHF (congestive heart failure) (HCC)    Chronic kidney disease    COPD (chronic obstructive pulmonary disease) ([place])    DDD (degenerative disc disease), lumbar    Degenerative arthritis    Diabetes mellitus type 2, uncomplicated ([place])    AODM (A1C 6.9%) 04/2013   Diabetes mellitus without complication (HCC)    Dyspnea    with exertion   Esophagitis    Gout    Uric acid 5.4 - 08/2012   Hypertension    Obesity, unspecified    Sarcoidosis, lung ([place])    reported by pt, Clinically without a biopsy   Sleep apnea    OSA--Use C-PAP    Past Surgical History:  Procedure Laterality Date   ABDOMINAL HYSTERECTOMY     age 24   ANTERIOR HIP REVISION Right 09/04/2021   Procedure: ANTERIOR HIP REVISION;  Surgeon: Hessie Knows, MD;  Location: [facility];  Service: Orthopedics;  Laterality: Right;    APPLICATION OF WOUND VAC Right 09/04/2021   Procedure: APPLICATION OF WOUND VAC;  Surgeon: Hessie Knows, MD;  Location: [facility];  Service: Orthopedics;  Laterality: Right;   CATARACT EXTRACTION Bilateral    COLONOSCOPY  11/02/2005   Hyperplastic Polyp: CBF 10/2015; Recall Ltr mailed 08/27/2015 (dw)   FOOT SURGERY Right    HALLUX VALGUS REPAIR Right    OOPHORECTOMY     PERIPHERAL VASCULAR THROMBECTOMY Right 10/23/2021   Procedure: PERIPHERAL VASCULAR THROMBECTOMY;  Surgeon: Katha Cabal, MD;  Location: [place] [facility];  Service: Cardiovascular;  Laterality: Right;   TOTAL HIP ARTHROPLASTY 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   Procedure: TOTAL HIP ARTHROPLASTY ANTERIOR APPROACH;  Surgeon: Hessie Knows, MD;  Location: [facility];  Service: Orthopedics;  Laterality: Right;    Social History Social History   Tobacco Use   Smoking status: Former    Packs/day: 0.70    Years: 3.00    Total pack years: 2.10    Types: Cigarettes    Start date: 01/12/1962    Quit date: 01/12/1966    Years since quitting: 55.9   Smokeless tobacco: Never  Vaping Use   Vaping Use: Never used  Substance Use Topics   Alcohol use: No    Alcohol/week: 0.0 standard drinks of alcohol   Drug use: No    Family History  Family History  Problem Relation Age of Onset   Diabetes Mother        ovarian cancer   Hypertension Mother    Ovarian cancer Mother    Diabetes Father        hardening of arteries   Heart disease Father    Hypertension Father    Diabetes Sister    Heart disease Sister    Diabetes Brother    Heart disease Brother     Allergies  Allergen Reactions   Ceftriaxone     Abnormal LFTS    Hydralazine Itching   Meloxicam Other (See Comments)    Causes excessive sleepiness fainting   Pioglitazone Itching   Tramadol Other (See Comments)    Causes excessive sleepiness Dizzy/fainting     REVIEW OF SYSTEMS (Negative unless checked)  Constitutional: [] Weight loss  [] Fever  [] Chills Cardiac: [] Chest  pain   [] Chest pressure   [] Palpitations   [] Shortness of breath when laying flat   [] Shortness of breath with exertion. Vascular:  [] Pain in legs with walking   [x] Pain in legs at rest  [] History of DVT   [] Phlebitis   [x] Swelling in legs   [] Varicose veins   [] Non-healing ulcers Pulmonary:   [] Uses home oxygen   [] Productive cough   [] Hemoptysis   [] Wheeze  [] COPD   [] Asthma Neurologic:  [] Dizziness   [] Seizures   [] History of stroke   [] History of TIA  [] Aphasia   [] Vissual changes   [] Weakness or numbness in arm   [] Weakness or numbness in leg Musculoskeletal:   [] Joint swelling   [] Joint pain   [] Low back pain Hematologic:  [] Easy bruising  [] Easy bleeding   [] Hypercoagulable state   [] Anemic Gastrointestinal:  [] Diarrhea   [] Vomiting  [] Gastroesophageal reflux/heartburn   [] Difficulty swallowing. Genitourinary:  [] Chronic kidney disease   [] Difficult urination  [] Frequent urination   [] Blood in urine Skin:  [] Rashes   [] Ulcers  Psychological:  [] History of anxiety   []  History of major depression.  Physical Examination  There were no vitals filed for this visit. There is no height or weight on file to calculate BMI. Gen: WD/WN, NAD Head: [place]/AT, No temporalis wasting.  Ear/Nose/Throat: Hearing grossly intact, nares w/o erythema or drainage, pinna without lesions Eyes: PER, EOMI, sclera nonicteric.  Neck: Supple, no gross masses.  No JVD.  Pulmonary:  Good air movement, no audible wheezing, no use of accessory muscles.  Cardiac: RRR, precordium not hyperdynamic. Vascular: Right arm is normal size trace edema Vessel Right Left  Radial Palpable Palpable  Gastrointestinal: soft, non-distended. No guarding/no peritoneal signs.  Musculoskeletal: M/S 5/5 throughout.  No deformity.  Neurologic: CN 2-12 intact. Pain and light touch intact in extremities.  Symmetrical.  Speech is fluent. Motor exam as listed above. Psychiatric: Judgment intact, Mood & affect appropriate for pt's clinical  situation. Dermatologic: Venous rashes no ulcers noted.  No changes consistent with cellulitis. Lymph : No lichenification or skin changes of chronic lymphedema.  CBC Lab Results  Component Value Date   WBC 5.8 10/24/2021   HGB 9.1 (L) 10/24/2021   HCT 27.8 (L) 10/24/2021   MCV 93.3 10/24/2021   PLT 333 10/24/2021    BMET    Component Value Date/Time   NA 137 10/24/2021 0647   NA 138 01/08/2014 1109   K 4.1 10/24/2021 0647   K 3.8 01/08/2014 1109   CL 111 10/24/2021 0647   CL 103 01/08/2014 1109   CO2 21 (L) 10/24/2021 0647   CO2 27  01/08/2014 1109   GLUCOSE 75 10/24/2021 0647   GLUCOSE 304 (H) 01/08/2014 1109   BUN 37 (H) 10/24/2021 0647   BUN 29 (H) 01/08/2014 1109   CREATININE 1.79 (H) 10/24/2021 0647   CREATININE 1.66 (H) 01/08/2014 1109   CALCIUM 8.8 (L) 10/24/2021 0647   CALCIUM 8.3 (L) 01/08/2014 1109   GFRNONAA 28 (L) 10/24/2021 0647   GFRNONAA 32 (L) 01/08/2014 1109   GFRAA 26 (L) 07/23/2016 0334   GFRAA 39 (L) 01/08/2014 1109   CrCl cannot be calculated (Patient's most recent lab result is older than the maximum 21 days allowed.).  COAG Lab Results  Component Value Date   INR 1.2 10/21/2021   INR 0.99 07/07/2016    Radiology No results found.   Assessment/Plan 1. Acute deep vein thrombosis (DVT) of axillary vein of right upper extremity (HCC) Recommend:   No further surgery or intervention at this point in time.  IVC filter is not indicated at present.  The patient is initiated on anticoagulation   Elevation is also still recommended.  I have discussed  DVT and post phlebitic changes such as swelling and why it  can cause symptoms such as pain.  The patient could try a graduated compression sleeve.  The compression, if it helps, should be worn on a daily basis. The patient should wearing the sleeve first thing in the morning and removing them in the evening. The patient should not to sleep in the sleeve.  In addition, behavioral modification  including elevation during the day and avoidance of prolonged dependency will be initiated.    The patient will continue anticoagulation for now as there have not been any problems or complications at this point.  - VAS [place] UPPER EXTREMITY VENOUS DUPLEX; Future  2. Essential (primary) hypertension Continue antihypertensive medications as already ordered, these medications have been reviewed and there are no changes at this time.  3. Chronic obstructive pulmonary disease, unspecified COPD type ([place]) Continue pulmonary medications and aerosols as already ordered, these medications have been reviewed and there are no changes at this time.   4. Type 2 diabetes mellitus with hypoglycemia without coma, without long-term current use of insulin (HCC) Continue hypoglycemic medications as already ordered, these medications have been reviewed and there are no changes at this time.  Hgb A1C to be monitored as already arranged by primary service  5. Chronic kidney disease, stage IV (severe) (HCC) The patient has advanced renal disease.  However, at the present time the patient is not yet on dialysis.  Avoid nephrotoxic medications and dehydration.  Further plans per nephrology    Hortencia Pilar, MD  11/23/2021 4:44 PM

## 2021-11-24 ENCOUNTER — Ambulatory Visit (INDEPENDENT_AMBULATORY_CARE_PROVIDER_SITE_OTHER): Payer: Medicare HMO | Admitting: Vascular Surgery

## 2021-11-24 ENCOUNTER — Encounter (INDEPENDENT_AMBULATORY_CARE_PROVIDER_SITE_OTHER): Payer: Self-pay | Admitting: Vascular Surgery

## 2021-11-24 VITALS — BP 161/74 | HR 45 | Resp 18 | Ht 63.0 in | Wt 172.8 lb

## 2021-11-24 DIAGNOSIS — I10 Essential (primary) hypertension: Secondary | ICD-10-CM | POA: Diagnosis not present

## 2021-11-24 DIAGNOSIS — E11649 Type 2 diabetes mellitus with hypoglycemia without coma: Secondary | ICD-10-CM | POA: Diagnosis not present

## 2021-11-24 DIAGNOSIS — J449 Chronic obstructive pulmonary disease, unspecified: Secondary | ICD-10-CM | POA: Diagnosis not present

## 2021-11-24 DIAGNOSIS — I82A11 Acute embolism and thrombosis of right axillary vein: Secondary | ICD-10-CM | POA: Diagnosis not present

## 2021-11-24 DIAGNOSIS — N184 Chronic kidney disease, stage 4 (severe): Secondary | ICD-10-CM

## 2021-12-01 ENCOUNTER — Telehealth: Payer: Self-pay

## 2021-12-01 DIAGNOSIS — T8450XD Infection and inflammatory reaction due to unspecified internal joint prosthesis, subsequent encounter: Secondary | ICD-10-CM

## 2021-12-01 MED ORDER — DOXYCYCLINE HYCLATE 100 MG PO TABS: 100.0000 mg | ORAL_TABLET | Freq: Two times a day (BID) | ORAL | 0 refills | Status: DC

## 2021-12-01 NOTE — Telephone Encounter (Signed)
Patient's niece called office stating patient is having allergic reaction to Amoxicillin. Patient states that she has been itching and has had hives since last night. Recommended she start taking benadryl/ zyrtec to help with itching. To hold off on antibiotic until I speak with provider.  Denies any swelling or difficulty breathing. Advised if she starts to experience this to go to ED. Would like to know if alternative antibiotic could be prescribed.  [facility] [address], [place]

## 2021-12-01 NOTE — Addendum Note (Signed)
Addended by: Leatrice Jewels on: 12/01/2021 01:55 PM   Modules accepted: Orders

## 2021-12-01 NOTE — Telephone Encounter (Signed)
Spoke with Dr Linus Salmons who would like to switch patient to doxy 100 mg BID.  [facility]. Spoke with patient and updated her on medication change. Understands she can stop Amoxicillin.  Advised patient to be careful when out in the sun since antibiotic can increase sun burn. Advised she avoid being in direct sun light for long periods of time and to make sure she takes this with food.  Verbalized understanding. Will pick up doxy today. Understands to continue taking benadryl/ zyrtec as needed for itching/ hives. Also to go to ED if she notices swelling or difficulty breathing. Leatrice Jewels, RMA

## 2021-12-15 ENCOUNTER — Telehealth (INDEPENDENT_AMBULATORY_CARE_PROVIDER_SITE_OTHER): Payer: Self-pay | Admitting: Vascular Surgery

## 2021-12-15 NOTE — Telephone Encounter (Signed)
Patient called in requesting a refill on medication. Only has about a week left is wanting a refill sent in ASAP    apixaban (ELIQUIS) 5 MG TABS tablet    [id number] [place], [place]

## 2021-12-16 ENCOUNTER — Other Ambulatory Visit (INDEPENDENT_AMBULATORY_CARE_PROVIDER_SITE_OTHER): Payer: Self-pay

## 2021-12-16 NOTE — Telephone Encounter (Signed)
Left message on [facility]icemail for Eliquis 5mg  bid #60 with 3 refills

## 2021-12-18 ENCOUNTER — Ambulatory Visit: Payer: Medicare HMO | Attending: Infectious Diseases | Admitting: Infectious Diseases

## 2021-12-18 ENCOUNTER — Encounter: Payer: Self-pay | Admitting: Infectious Diseases

## 2021-12-18 ENCOUNTER — Other Ambulatory Visit
Admission: RE | Admit: 2021-12-18 | Discharge: 2021-12-18 | Disposition: A | Discharge status: Home / Self Care | Payer: Medicare HMO | Attending: Infectious Diseases | Admitting: Infectious Diseases

## 2021-12-18 VITALS — BP 145/77 | HR 59 | Temp 97.2°F | Ht 64.0 in | Wt 170.0 lb

## 2021-12-18 DIAGNOSIS — Z86718 Personal history of other venous thrombosis and embolism: Secondary | ICD-10-CM | POA: Insufficient documentation

## 2021-12-18 DIAGNOSIS — N189 Chronic kidney disease, unspecified: Secondary | ICD-10-CM | POA: Insufficient documentation

## 2021-12-18 DIAGNOSIS — E1136 Type 2 diabetes mellitus with diabetic cataract: Secondary | ICD-10-CM | POA: Insufficient documentation

## 2021-12-18 DIAGNOSIS — M109 Gout, unspecified: Secondary | ICD-10-CM | POA: Insufficient documentation

## 2021-12-18 DIAGNOSIS — I130 Hypertensive heart and chronic kidney disease with heart failure and stage 1 through stage 4 chronic kidney disease, or unspecified chronic kidney disease: Secondary | ICD-10-CM | POA: Diagnosis present

## 2021-12-18 DIAGNOSIS — T8450XD Infection and inflammatory reaction due to unspecified internal joint prosthesis, subsequent encounter: Secondary | ICD-10-CM | POA: Insufficient documentation

## 2021-12-18 DIAGNOSIS — T8451XD Infection and inflammatory reaction due to internal right hip prosthesis, subsequent encounter: Secondary | ICD-10-CM | POA: Diagnosis not present

## 2021-12-18 DIAGNOSIS — Z96641 Presence of right artificial hip joint: Secondary | ICD-10-CM | POA: Diagnosis not present

## 2021-12-18 DIAGNOSIS — E1122 Type 2 diabetes mellitus with diabetic chronic kidney disease: Secondary | ICD-10-CM | POA: Insufficient documentation

## 2021-12-18 LAB — COMPREHENSIVE METABOLIC PANEL
ALT: 19 U/L (ref 0–44)
AST: 22 U/L (ref 15–41)
Albumin: 3.8 g/dL (ref 3.5–5.0)
Alkaline Phosphatase: 115 U/L (ref 38–126)
Anion gap: 8 (ref 5–15)
BUN: 36 mg/dL — ABNORMAL HIGH (ref 8–23)
CO2: 23 mmol/L (ref 22–32)
Calcium: 9.9 mg/dL (ref 8.9–10.3)
Chloride: 107 mmol/L (ref 98–111)
Creatinine, Ser: 1.98 mg/dL — ABNORMAL HIGH (ref 0.44–1.00)
GFR, Estimated: 25 mL/min — ABNORMAL LOW (ref >60)
Glucose, Bld: 157 mg/dL — ABNORMAL HIGH (ref 70–99)
Potassium: 4.3 mmol/L (ref 3.5–5.1)
Sodium: 138 mmol/L (ref 135–145)
Total Bilirubin: 0.6 mg/dL (ref 0.3–1.2)
Total Protein: 7.9 g/dL (ref 6.5–8.1)

## 2021-12-18 LAB — CBC WITH DIFFERENTIAL/PLATELET
Abs Immature Granulocytes: 0.02 10*3/uL (ref 0.00–0.07)
Basophils Absolute: 0.1 10*3/uL (ref 0.0–0.1)
Basophils Relative: 1 %
Eosinophils Absolute: 0.1 10*3/uL (ref 0.0–0.5)
Eosinophils Relative: 2 %
HCT: 32.9 % — ABNORMAL LOW (ref 36.0–46.0)
Hemoglobin: 10.7 g/dL — ABNORMAL LOW (ref 12.0–15.0)
Immature Granulocytes: 0 %
Lymphocytes Relative: 17 %
Lymphs Abs: 1.3 10*3/uL (ref 0.7–4.0)
MCH: 29.6 pg (ref 26.0–34.0)
MCHC: 32.5 g/dL (ref 30.0–36.0)
MCV: 90.9 fL (ref 80.0–100.0)
Monocytes Absolute: 0.5 10*3/uL (ref 0.1–1.0)
Monocytes Relative: 7 %
Neutro Abs: 5.3 10*3/uL (ref 1.7–7.7)
Neutrophils Relative %: 73 %
Platelets: 348 10*3/uL (ref 150–400)
RBC: 3.62 MIL/uL — ABNORMAL LOW (ref 3.87–5.11)
RDW: 13.6 % (ref 11.5–15.5)
WBC: 7.3 10*3/uL (ref 4.0–10.5)
nRBC: 0 % (ref 0.0–0.2)

## 2021-12-18 LAB — SEDIMENTATION RATE: Sed Rate: 81 mm/hr — ABNORMAL HIGH (ref 0–30)

## 2021-12-18 LAB — C-REACTIVE PROTEIN: CRP: 1.7 mg/dL — ABNORMAL HIGH (ref <1.0)

## 2021-12-18 MED ORDER — DOXYCYCLINE HYCLATE 100 MG PO TABS: 100.0000 mg | ORAL_TABLET | Freq: Two times a day (BID) | ORAL | 3 refills | Status: DC

## 2021-12-18 NOTE — Progress Notes (Signed)
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 female with a history of  DM, HTN, Gout, CKD rt hip replacement in 2018 was recently in the hospital with rt hip pain  She had Rt hip replacement in 2018 She always had pain after the sugery 5 years ago, but in June 2023 noted a dark tender spot on the surgical scar- Her PCP gave her levaquin X 10 days on 06/20/21 -wbc was normal  - She says the levaquin improved the pain some what but only to recur She is on allopurinol for gout and followed by rheumatologist who she saw on 06/23/21 She saw Dr.Menz , ortho on 07/07/21. He sent ESR/CRP  ESR was 60 and CRP N  On 08/07/21 she saw surgeon and he did not think there was any thigh abscess She had MRI on 08/20/21 and it showed Well-defined periprosthetic fluid collection along the lateral margin of the right femoral neck extends through the anterior compartment musculature of the proximal right thigh along its the expected site of prior surgical incision. Collection extends into the overlying subcutaneous soft tissues at the anterolateral aspect of the thigh. Although difficult to measure given the elongated, slightly serpiginous course of the collection, the collection measures approximately 11 cm in length. There is no obvious connection to the overlying skin surfac Pt returned to see Dr.Menz on 8/21/with worsening apin and rt hip was aspirated and showed 16K wbc with 90% N- crystals neg- dont see any culture She was taken to OR today  Dr.Menz's note says  :" Prior incision was opened at the bottom of the incision distally there is approximately 2 cm area of necrotic fat which was cultured.  Going deep to this the deep fascia was incised and the tensor muscle retracted posteriorly and the joint capsule exposed with again tissue cultures obtained when opening the capsule fluid was also  cultured from the joint fluid but it did not appear very inflammatory.  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 ( initially on day of discharge only the superficial cultures were positive and later the joint capsule and synovial fluid were positive as well)when  She was discharged home on IV ceftrixaone She had elevated LFTS thought to be due to ceftriaxone and it was dc on 09/26/21 and she had a few days off meds and a repeat test in 4 days showed normalization of the LFTS- she went on ertapenem ( with a couple of days of Po doxy) She c/o of swelling of rt arm for the past few days-  Doppler US showed DVT of rt arm and she was hospitalized 10/21/21-10/24/21 and underwent thrombectomy- She was discharged on eliquis- She completed 6 weeks of IV on 10/24/11 and was tested for PO augmentin which she tolerated well and sent home on that for 4 months- on 12/01/21 she was c/o itching and augmentin was switched to Doxy by ID Dr.Comer. Pt has been on Doxy for > 14 days. She says she still has some  itching but never had rash No fever, no pain hip Walking with short steps Past Medical History:  Diagnosis Date   Anemia    Anemia, unspecified    Hgb 11.8 - 04/2013   Arthritis    Cataract    Bilateral   CHF (congestive heart failure) (HCC)    Chronic kidney disease    COPD (chronic obstructive pulmonary disease) (HCC)    DDD (degenerative disc disease), lumbar    Degenerative arthritis    Diabetes mellitus type 2, uncomplicated (HCC)    AODM (A1C 6.9%) 04/2013   Diabetes mellitus without complication (HCC)    Dyspnea    with exertion   Esophagitis    Gout    Uric  acid 5.4 - 08/2012   Hypertension    Obesity, unspecified    Sarcoidosis, lung ([place])    reported by pt, Clinically without a biopsy   Sleep apnea    OSA--Use C-PAP    Past Surgical History:  Procedure Laterality Date   ABDOMINAL HYSTERECTOMY     age 77   ANTERIOR HIP REVISION Right 09/04/2021   Procedure: ANTERIOR HIP REVISION;  Surgeon: Hessie Knows, MD;  Location: [facility];  Service: Orthopedics;  Laterality: Right;   APPLICATION OF WOUND VAC Right 09/04/2021   Procedure: APPLICATION OF WOUND VAC;  Surgeon: Hessie Knows, MD;  Location: [facility];  Service: Orthopedics;  Laterality: Right;   CATARACT EXTRACTION Bilateral    COLONOSCOPY  11/02/2005   Hyperplastic Polyp: CBF 10/2015; Recall Ltr mailed 08/27/2015 (dw)   FOOT SURGERY Right    HALLUX VALGUS REPAIR Right    OOPHORECTOMY     PERIPHERAL VASCULAR THROMBECTOMY Right 10/23/2021   Procedure: PERIPHERAL VASCULAR THROMBECTOMY;  Surgeon: Katha Cabal, MD;  Location: [place];  Service: Cardiovascular;  Laterality: Right;   TOTAL HIP ARTHROPLASTY 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   Procedure: TOTAL HIP ARTHROPLASTY ANTERIOR APPROACH;  Surgeon: Hessie Knows, MD;  Location: [facility];  Service: Orthopedics;  Laterality: Right;    Social History   Socioeconomic History   Marital status: Divorced    Spouse name: Not on file   Number of children: 1   Years of education: 2 years college   Highest education level: Not on file  Occupational History   Occupation: retired    Comment: worked at [employer] Use   Smoking status: Former    Packs/day: 0.70    Years: 3.00    Total pack years: 2.10    Types: Cigarettes    Start date: 01/12/1962    Quit date: 01/12/1966    Years since quitting: 55.9   Smokeless tobacco: Never  Vaping Use   Vaping Use: Never used  Substance and Sexual Activity   Alcohol use: No    Alcohol/week: 0.0 standard drinks of alcohol   Drug use: No   Sexual activity: Not on file  Other Topics Concern    Not on file  Social History Narrative   Not on file   Social Determinants of Health   Financial Resource Strain: Not on file  Food Insecurity: No Food Insecurity (10/22/2021)   Hunger Vital Sign    Worried About Running Out of Food in the Last Year: Never true    Schle[place] in the Last Year: Never true  Transportation Needs: No Transportation Needs (10/22/2021)   PRAPARE - Hydrologist (Medical): No    Lack of Transportation (Non-Medical): No  Physical Activity: Not on  file  Stress: Not on file  Social Connections: Not on file  Intimate Partner Violence: Not At Risk (10/22/2021)   Humiliation, Afraid, Rape, and Kick questionnaire    Fear of Current or Ex-Partner: No    Emotionally Abused: No    Physically Abused: No    Sexually Abused: No    Family History  Problem Relation Age of Onset   Diabetes Mother        ovarian cancer   Hypertension Mother    Ovarian cancer Mother    Diabetes Father        hardening of arteries   Heart disease Father    Hypertension Father    Diabetes Sister    Heart disease Sister    Diabetes Brother    Heart disease Brother    Allergies  Allergen Reactions   Ceftriaxone     Abnormal LFTS    Hydralazine Itching   Meloxicam Other (See Comments)    Causes excessive sleepiness fainting   Pioglitazone Itching   Tramadol Other (See Comments)    Causes excessive sleepiness Dizzy/fainting   I? Current Outpatient Medications  Medication Sig Dispense Refill   albuterol (PROVENTIL HFA;VENTOLIN HFA) 108 (90 Base) MCG/ACT inhaler Inhale 2 puffs into the lungs every 6 (six) hours as needed for wheezing or shortness of breath. 1 Inhaler 0   allopurinol (ZYLOPRIM) 100 MG tablet Take by mouth.     amLODipine (NORVASC) 5 MG tablet Take 5 mg by mouth daily.      apixaban (ELIQUIS) 5 MG TABS tablet Two tabs po twice a day for seven days then 1 tab po twice a day afterwards 74 tablet 0   budesonide-formoterol  (SYMBICORT) 160-4.5 MCG/ACT inhaler Inhale 2 puffs into the lungs 2 (two) times daily.      Calcium Carbonate-Vitamin D3 (CALCIUM 600/VITAMIN D) 600-400 MG-UNIT TABS Take 2 tablets by mouth daily.     cetirizine (ZYRTEC) 10 MG tablet Take 10 mg by mouth at bedtime.      diclofenac sodium (VOLTAREN) 1 % GEL Apply topically 2 (two) times daily as needed (pain).     docusate sodium (COLACE) 100 MG capsule Take 1 capsule (100 mg total) by mouth 2 (two) times daily. 10 capsule 0   doxycycline (VIBRA-TABS) 100 MG tablet Take 1 tablet (100 mg total) by mouth 2 (two) times daily. 60 tablet 0   fexofenadine (ALLEGRA) 180 MG tablet Take 180 mg by mouth daily.      ipratropium-albuterol (DUONEB) 0.5-2.5 (3) MG/3ML SOLN Take 3 mLs by nebulization every 6 (six) hours as needed (shortness of breath or wheezing).     isosorbide mononitrate (IMDUR) 60 MG 24 hr tablet Take 180 mg by mouth daily.      losartan (COZAAR) 50 MG tablet Take 100 mg by mouth daily.      magnesium hydroxide (MILK OF MAGNESIA) 400 MG/5ML suspension Take 30 mLs by mouth every 4 (four) hours as needed. If constipation/ no BM for 2 days     mometasone (NASONEX) 50 MCG/ACT nasal spray Place 2 sprays into the nose daily as needed (allergies).      montelukast (SINGULAIR) 10 MG tablet Take 10 mg by mouth every evening.      omeprazole (PRILOSEC) 20 MG capsule      oxybutynin (DITROPAN) 5 MG tablet      oxyCODONE (OXY IR/ROXICODONE) 5 MG immediate release tablet Take 1-2 tablets (5-10 mg total) by mouth every 4 (four) hours as needed for moderate pain (pain  score 4-6). 30 tablet 0   sitaGLIPtin (JANUVIA) 50 MG tablet Take 50 mg by mouth daily.     spironolactone (ALDACTONE) 25 MG tablet Take 25 mg by mouth daily.      tiotropium (SPIRIVA) 18 MCG inhalation capsule Place 18 mcg into inhaler and inhale daily.     vitamin B-12 (CYANOCOBALAMIN) 1000 MCG tablet Take 1,000 mcg by mouth daily.     vitamin C (ASCORBIC ACID) 500 MG tablet Take 500 mg by  mouth daily.      No current facility-administered medications for this visit.     Abtx:  Anti-infectives (From admission, onward)    None       REVIEW OF SYSTEMS:  Const: negative fever, negative chills, negative weight loss Eyes: negative diplopia or visual changes, negative eye pain ENT: negative coryza, negative sore throat Resp: negative cough, hemoptysis, dyspnea Cards: negative for chest pain, palpitations, lower extremity edema GU: negative for frequency, dysuria and hematuria GI: Negative for abdominal pain, diarrhea, bleeding, constipation Skin:  pruritus Heme: negative for easy bruising and gum/nose bleeding MS: rt hip pain resolved Neurolo:negative for headaches, dizziness, vertigo, memory problems  Psych: negative for feelings of anxiety, depression  Endocrine: negative for thyroid, diabetes Allergy/Immunology-PCN rash  Objective:  VITALS:  BP (!) 178/81   Pulse (!) 54   Temp (!) 97.1 F (36.2 C) ( LDA Rt PICC PHYSICAL EXAM:  General: Alert, cooperative, no distress, appears stated age.  Head: Normocephalic, without obvious abnormality, atraumatic. Eyes: Conjunctivae clear, anicteric sclerae. Pupils are equal ENT Nares normal. No drainage or sinus tenderness. Lips, mucosa, and tongue normal. No Thrush Neck: Supple, symmetrical, no adenopathy, thyroid: non tender no carotid bruit and no JVD. Back: No CVA tenderness. Lungs: Clear to auscultation bilaterally. No Wheezing or Rhonchi. No rales. Heart: Regular rate and rhythm, no murmur, rub or gallop. Abdomen: Soft, non-tender,not distended. Bowel sounds normal. No masses Extremities:rt arm no swelling Rthip surgical site healed well Able to walk with short steps- currently in wheel chair Lymph: Cervical, supraclavicular normal. Neurologic: Grossly non-focal Pertinent Labs Lab Results CBC    Component Value Date/Time   WBC 5.8 10/24/2021 0647   RBC 2.98 (L) 10/24/2021 0647   HGB 9.1 (L) 10/24/2021  0647   HGB 11.6 (L) 01/08/2014 1109   HCT 27.8 (L) 10/24/2021 0647   HCT 35.6 01/08/2014 1109   PLT 333 10/24/2021 0647   PLT 282 01/08/2014 1109   MCV 93.3 10/24/2021 0647   MCV 93 01/08/2014 1109   MCH 30.5 10/24/2021 0647   MCHC 32.7 10/24/2021 0647   RDW 13.4 10/24/2021 0647   RDW 14.0 01/08/2014 1109   LYMPHSABS 2.1 10/22/2021 0117   MONOABS 0.5 10/22/2021 0117   EOSABS 0.4 10/22/2021 0117   BASOSABS 0.1 10/22/2021 0117   09/23/21 HB 8.7 WBC 6 PLT 563 ESR 56 ( 100 on 09/16/21) CRP 10 ( range 0-10)  AST 286 ALT 315 Alkpo4 179 TB 0.3    Latest Ref Rng & Units 10/24/2021    6:47 AM 10/22/2021    7:03 AM 10/22/2021    1:17 AM  CMP  Glucose 70 - 99 mg/dL 75  83  58   BUN 8 - 23 mg/dL 37  33  36   Creatinine 0.44 - 1.00 mg/dL 1.79  1.69  1.99   Sodium 135 - 145 mmol/L 137  139  141   Potassium 3.5 - 5.1 mmol/L 4.1  3.5  3.6   Chloride 98 - 111 mmol/L  111  114  112   CO2 22 - 32 mmol/L _0 Calcium 8.9 - 10.3 mg/dL 8.8  8.1  9.2   Total Protein 6.5 - 8.1 g/dL   7.1   Total Bilirubin 0.3 - 1.2 mg/dL   0.5   Alkaline Phos 38 - 126 U/L   118   AST 15 - 41 U/L   28   ALT 0 - 44 U/L   21       Microbiology: Joint capsule Actinomyces  Superficial layer - actinomyces Middle layer soft tissue actinomyces  ? 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 LFTS normalized Started on Amoxicillin, but because of pruritus switched to Doxy after a month on amoxicillin. She says still has some itching but no rash- not sure whether another medicine is involved? Eliquis also was a new medicine that was started on 10/12 along with amoxicillin H/o PCN allergy  H/o rt arm DVT related to PICC in Oct 2023 S/p mechanical thrombectomy and eliquis  DM on januvia   HTN on  amlodipine  CKD  Discussed the management with patient? Labs today Follow up 2 months CRP 1.7 ( was 2 on 10/21/21) ESR 981 ( was 72 on 10/21/21)

## 2021-12-22 ENCOUNTER — Telehealth: Payer: Self-pay

## 2021-12-22 NOTE — Telephone Encounter (Signed)
-----   Message from Tsosie Billing, MD sent at 12/19/2021 12:46 PM EST ----- Please let her know the labs from yesterday that the kidney function as usual is impaired and ask her to continue to follow up with kidney doctor- she will have to continue to take the antibiotic doxycyline and follow up with me as scheduled. thx ----- Message ----- From: Buel Ream, [facility] [place] Sent: 12/18/2021  11:21 AM EST To: Tsosie Billing, MD

## 2021-12-22 NOTE — Telephone Encounter (Signed)
Patient is aware of lab results and recommendations, she had no further questions.

## 2021-12-30 ENCOUNTER — Ambulatory Visit: Payer: Self-pay

## 2021-12-30 ENCOUNTER — Ambulatory Visit (LOCAL_COMMUNITY_HEALTH_CENTER): Payer: Medicare HMO

## 2021-12-30 DIAGNOSIS — Z719 Counseling, unspecified: Secondary | ICD-10-CM

## 2021-12-30 DIAGNOSIS — Z23 Encounter for immunization: Secondary | ICD-10-CM | POA: Diagnosis not present

## 2021-12-30 NOTE — Progress Notes (Signed)
  Are you feeling sick today? No   Have you ever received a dose of COVID-19 Vaccine? [employer], [place], [place], [place], Other) Yes  If yes, which vaccine and how many doses?   5 doses Moderna   Did you bring the vaccination record card or other documentation?  Yes   Do you have a health condition or are undergoing treatment that makes you moderately or severely immunocompromised? This would include, but not be limited to: cancer, HIV, organ transplant, immunosuppressive therapy/high-dose corticosteroids, or moderate/severe primary immunodeficiency.  No  Have you received COVID-19 vaccine before or during hematopoietic cell transplant (HCT) or CAR-T-cell therapies? No  Have you ever had an allergic reaction to: (This would include a severe allergic reaction or a reaction that caused hives, swelling, or respiratory distress, including wheezing.) A component of a COVID-19 vaccine or a previous dose of COVID-19 vaccine? No   Have you ever had an allergic reaction to another vaccine (other thanCOVID-19 vaccine) or an injectable medication? (This would include a severe allergic reaction or a reaction that caused hives, swelling, or respiratory distress, including wheezing.)   No    Do you have a history of any of the following:  Myocarditis or Pericarditis No  Dermal fillers:  No  Multisystem Inflammatory Syndrome (MIS-C or MIS-A)? No  COVID-19 disease within the past 3 months? No  Vaccinated with monkeypox vaccine in the last 4 weeks? No  VIS provided. COVID vaccine/Spikevax +12Y 2023-24 IM left deltoid.  Tolerated well. Waited 15 minutes.  [facility] updated and copy provided.

## 2022-02-09 ENCOUNTER — Ambulatory Visit: Payer: Medicare HMO | Admitting: Podiatry

## 2022-02-12 ENCOUNTER — Ambulatory Visit: Payer: Medicare HMO | Admitting: Podiatry

## 2022-03-12 ENCOUNTER — Inpatient Hospital Stay: Payer: Medicare HMO | Admitting: Infectious Diseases

## 2022-03-19 ENCOUNTER — Ambulatory Visit: Payer: Medicare PPO | Attending: Infectious Diseases | Admitting: Infectious Diseases

## 2022-03-19 ENCOUNTER — Encounter: Payer: Self-pay | Admitting: Infectious Diseases

## 2022-03-19 ENCOUNTER — Other Ambulatory Visit
Admission: RE | Admit: 2022-03-19 | Discharge: 2022-03-19 | Disposition: A | Discharge status: Home / Self Care | Payer: Medicare PPO | Source: Ambulatory Visit | Attending: Infectious Diseases | Admitting: Infectious Diseases

## 2022-03-19 VITALS — BP 154/71 | HR 59 | Temp 97.0°F | Ht 63.0 in | Wt 177.0 lb

## 2022-03-19 DIAGNOSIS — M00851 Arthritis due to other bacteria, right hip: Secondary | ICD-10-CM | POA: Insufficient documentation

## 2022-03-19 DIAGNOSIS — T8451XA Infection and inflammatory reaction due to internal right hip prosthesis, initial encounter: Secondary | ICD-10-CM | POA: Diagnosis not present

## 2022-03-19 DIAGNOSIS — Z96641 Presence of right artificial hip joint: Secondary | ICD-10-CM | POA: Insufficient documentation

## 2022-03-19 DIAGNOSIS — T8451XD Infection and inflammatory reaction due to internal right hip prosthesis, subsequent encounter: Secondary | ICD-10-CM

## 2022-03-19 DIAGNOSIS — I130 Hypertensive heart and chronic kidney disease with heart failure and stage 1 through stage 4 chronic kidney disease, or unspecified chronic kidney disease: Secondary | ICD-10-CM | POA: Insufficient documentation

## 2022-03-19 DIAGNOSIS — Z86718 Personal history of other venous thrombosis and embolism: Secondary | ICD-10-CM | POA: Insufficient documentation

## 2022-03-19 DIAGNOSIS — Z7901 Long term (current) use of anticoagulants: Secondary | ICD-10-CM | POA: Insufficient documentation

## 2022-03-19 DIAGNOSIS — M109 Gout, unspecified: Secondary | ICD-10-CM | POA: Insufficient documentation

## 2022-03-19 DIAGNOSIS — Z7984 Long term (current) use of oral hypoglycemic drugs: Secondary | ICD-10-CM | POA: Insufficient documentation

## 2022-03-19 DIAGNOSIS — Y838 Other surgical procedures as the cause of abnormal reaction of the patient, or of later complication, without mention of misadventure at the time of the procedure: Secondary | ICD-10-CM | POA: Diagnosis not present

## 2022-03-19 DIAGNOSIS — E1122 Type 2 diabetes mellitus with diabetic chronic kidney disease: Secondary | ICD-10-CM | POA: Insufficient documentation

## 2022-03-19 DIAGNOSIS — Z79899 Other long term (current) drug therapy: Secondary | ICD-10-CM | POA: Insufficient documentation

## 2022-03-19 DIAGNOSIS — Y798 Miscellaneous orthopedic devices associated with adverse incidents, not elsewhere classified: Secondary | ICD-10-CM | POA: Diagnosis not present

## 2022-03-19 DIAGNOSIS — N189 Chronic kidney disease, unspecified: Secondary | ICD-10-CM | POA: Insufficient documentation

## 2022-03-19 DIAGNOSIS — T8452XA Infection and inflammatory reaction due to internal left hip prosthesis, initial encounter: Secondary | ICD-10-CM | POA: Diagnosis present

## 2022-03-19 DIAGNOSIS — T8450XD Infection and inflammatory reaction due to unspecified internal joint prosthesis, subsequent encounter: Secondary | ICD-10-CM

## 2022-03-19 LAB — COMPREHENSIVE METABOLIC PANEL
ALT: 28 U/L (ref 0–44)
AST: 25 U/L (ref 15–41)
Albumin: 3.5 g/dL (ref 3.5–5.0)
Alkaline Phosphatase: 119 U/L (ref 38–126)
Anion gap: 8 (ref 5–15)
BUN: 45 mg/dL — ABNORMAL HIGH (ref 8–23)
CO2: 17 mmol/L — ABNORMAL LOW (ref 22–32)
Calcium: 9.3 mg/dL (ref 8.9–10.3)
Chloride: 111 mmol/L (ref 98–111)
Creatinine, Ser: 2.21 mg/dL — ABNORMAL HIGH (ref 0.44–1.00)
GFR, Estimated: 22 mL/min — ABNORMAL LOW (ref >60)
Glucose, Bld: 142 mg/dL — ABNORMAL HIGH (ref 70–99)
Potassium: 3.9 mmol/L (ref 3.5–5.1)
Sodium: 136 mmol/L (ref 135–145)
Total Bilirubin: 0.6 mg/dL (ref 0.3–1.2)
Total Protein: 7 g/dL (ref 6.5–8.1)

## 2022-03-19 LAB — CBC WITH DIFFERENTIAL/PLATELET
Abs Immature Granulocytes: 0.03 10*3/uL (ref 0.00–0.07)
Basophils Absolute: 0.1 10*3/uL (ref 0.0–0.1)
Basophils Relative: 1 %
Eosinophils Absolute: 0.1 10*3/uL (ref 0.0–0.5)
Eosinophils Relative: 2 %
HCT: 32.1 % — ABNORMAL LOW (ref 36.0–46.0)
Hemoglobin: 10.6 g/dL — ABNORMAL LOW (ref 12.0–15.0)
Immature Granulocytes: 1 %
Lymphocytes Relative: 20 %
Lymphs Abs: 1.2 10*3/uL (ref 0.7–4.0)
MCH: 30.4 pg (ref 26.0–34.0)
MCHC: 33 g/dL (ref 30.0–36.0)
MCV: 92 fL (ref 80.0–100.0)
Monocytes Absolute: 0.3 10*3/uL (ref 0.1–1.0)
Monocytes Relative: 6 %
Neutro Abs: 4.4 10*3/uL (ref 1.7–7.7)
Neutrophils Relative %: 70 %
Platelets: 252 10*3/uL (ref 150–400)
RBC: 3.49 MIL/uL — ABNORMAL LOW (ref 3.87–5.11)
RDW: 14.7 % (ref 11.5–15.5)
WBC: 6.2 10*3/uL (ref 4.0–10.5)
nRBC: 0 % (ref 0.0–0.2)

## 2022-03-19 LAB — SEDIMENTATION RATE: Sed Rate: 51 mm/hr — ABNORMAL HIGH (ref 0–30)

## 2022-03-19 LAB — C-REACTIVE PROTEIN: CRP: <0.5 mg/dL (ref <1.0)

## 2022-03-19 MED ORDER — DOXYCYCLINE HYCLATE 100 MG PO TABS: 100.0000 mg | ORAL_TABLET | Freq: Two times a day (BID) | ORAL | 3 refills | Status: DC

## 2022-03-19 NOTE — Progress Notes (Signed)
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 female with a history of  DM, HTN, Gout, CKD rt hip replacement in 2018 was recently in the hospital with rt hip pain  She had Rt hip replacement in 2018 She always had pain after the sugery 5 years ago, but in June 2023 noted a dark tender spot on the surgical scar- Her PCP gave her levaquin X 10 days on 06/20/21 -wbc was normal  - She says the levaquin improved the pain some what but only to recur She is on allopurinol for gout and followed by rheumatologist who she saw on 06/23/21 She saw Dr.Menz , ortho on 07/07/21. He sent ESR/CRP  ESR was 60 and CRP N  On 08/07/21 she saw surgeon and he did not think there was any thigh abscess She had MRI on 08/20/21 and it showed Well-defined periprosthetic fluid collection along the lateral margin of the right femoral neck extends through the anterior compartment musculature of the proximal right thigh along its the expected site of prior surgical incision. Collection extends into the overlying subcutaneous soft tissues at the anterolateral aspect of the thigh. Although difficult to measure given the elongated, slightly serpiginous course of the collection, the collection measures approximately 11 cm in length. There is no obvious connection to the overlying skin surfac Pt returned to see Dr.Menz on 8/21/with worsening apin and rt hip was aspirated and showed 16K wbc with 90% N- crystals neg- dont see any culture She was taken to OR today  Dr.Menz's note says  :" Prior incision was opened at the bottom of the incision distally there is approximately 2 cm area of necrotic fat which was cultured.  Going deep to this the  deep fascia was incised and the tensor muscle retracted posteriorly and the joint capsule exposed with again tissue cultures obtained when opening the capsule fluid was also cultured from the joint fluid but it did not appear very inflammatory.  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 ( initially on day of discharge only the superficial cultures were positive and later the joint capsule and synovial fluid were positive as well)when  She was discharged home on IV ceftrixaone She had elevated LFTS thought to be due to ceftriaxone and it was dc on 09/26/21 and she had a few days off meds and a repeat test in 4 days showed normalization of the LFTS- she went on ertapenem ( with a couple of days of Po doxy) She c/o of swelling of rt arm for the past few days-  Doppler US showed DVT of rt arm and she was hospitalized 10/21/21-10/24/21 and underwent thrombectomy- She was discharged on eliquis- She completed 6 weeks of IV on 10/24/11 and was tested for PO augmentin which she tolerated well and sent home on that for  4 months- on 12/01/21 she was c/o itching and augmentin was switched to Doxy by ID Dr.Comer.    Past Medical History:  Diagnosis Date   Anemia    Anemia, unspecified    Hgb 11.8 - 04/2013   Arthritis    Cataract    Bilateral   CHF (congestive heart failure) (HCC)    Chronic kidney disease    COPD (chronic obstructive pulmonary disease) (HCC)    DDD (degenerative disc disease), lumbar    Degenerative arthritis    Diabetes mellitus type 2, uncomplicated (HCC)    AODM (A1C 6.9%) 04/2013   Diabetes mellitus without complication (HCC)    Dyspnea    with  exertion   Esophagitis    Gout    Uric acid 5.4 - 08/2012   Hypertension    Obesity, unspecified    Sarcoidosis, lung ([place])    reported by pt, Clinically without a biopsy   Sleep apnea    OSA--Use C-PAP    Past Surgical History:  Procedure Laterality Date   ABDOMINAL HYSTERECTOMY     age 38   ANTERIOR HIP REVISION Right 09/04/2021   Procedure: ANTERIOR HIP REVISION;  Surgeon: Hessie Knows, MD;  Location: [facility];  Service: Orthopedics;  Laterality: Right;   APPLICATION OF WOUND VAC Right 09/04/2021   Procedure: APPLICATION OF WOUND VAC;  Surgeon: Hessie Knows, MD;  Location: [facility];  Service: Orthopedics;  Laterality: Right;   CATARACT EXTRACTION Bilateral    COLONOSCOPY  11/02/2005   Hyperplastic Polyp: CBF 10/2015; Recall Ltr mailed 08/27/2015 (dw)   FOOT SURGERY Right    HALLUX VALGUS REPAIR Right    OOPHORECTOMY     PERIPHERAL VASCULAR THROMBECTOMY Right 10/23/2021   Procedure: PERIPHERAL VASCULAR THROMBECTOMY;  Surgeon: Katha Cabal, MD;  Location: [facility];  Service: Cardiovascular;  Laterality: Right;   TOTAL HIP ARTHROPLASTY 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   Procedure: TOTAL HIP ARTHROPLASTY ANTERIOR APPROACH;  Surgeon: Hessie Knows, MD;  Location: [facility];  Service: Orthopedics;  Laterality: Right;    Social History   Socioeconomic History   Marital status: Divorced    Spouse name: Not on file   Number of children: 1   Years of education: 2 years college   Highest education level: Not on file  Occupational History   Occupation: retired    Comment: worked at [employer] Use   Smoking status: Former    Packs/day: 0.70    Years: 3.00    Total pack years: 2.10    Types: Cigarettes    Start date: 01/12/1962    Quit date: 01/12/1966    Years since quitting: 56.2   Smokeless tobacco: Never  Vaping Use   Vaping Use: Never used  Substance and Sexual Activity   Alcohol use: No    Alcohol/week: 0.0 standard drinks of alcohol   Drug use: No   Sexual  activity: Not on file  Other Topics Concern   Not on file  Social History Narrative   Not on file   Social Determinants of Health   Financial Resource Strain: Not on file  Food Insecurity: No Food Insecurity (10/22/2021)   Hunger Vital Sign    Worried About Running Out of Food in the Last Year: Never true    [place]ian in the Last Year: Never true  Transportation Needs: No Transportation Needs (10/22/2021)   PRAPARE - Hydrologist (Medical): No    Lack of Transportation (Non-Medical):  No  Physical Activity: Not on file  Stress: Not on file  Social Connections: Not on file  Intimate Partner Violence: Not At Risk (10/22/2021)   Humiliation, Afraid, Rape, and Kick questionnaire    Fear of Current or Ex-Partner: No    Emotionally Abused: No    Physically Abused: No    Sexually Abused: No    Family History  Problem Relation Age of Onset   Diabetes Mother        ovarian cancer   Hypertension Mother    Ovarian cancer Mother    Diabetes Father        hardening of arteries   Heart disease Father    Hypertension Father    Diabetes Sister    Heart disease Sister    Diabetes Brother    Heart disease Brother    Allergies  Allergen Reactions   Ceftriaxone     Abnormal LFTS    Hydralazine Itching   Meloxicam Other (See Comments)    Causes excessive sleepiness fainting   Pioglitazone Itching   Tramadol Other (See Comments)    Causes excessive sleepiness Dizzy/fainting   I? Current Outpatient Medications  Medication Sig Dispense Refill   albuterol (PROVENTIL HFA;VENTOLIN HFA) 108 (90 Base) MCG/ACT inhaler Inhale 2 puffs into the lungs every 6 (six) hours as needed for wheezing or shortness of breath. 1 Inhaler 0   allopurinol (ZYLOPRIM) 100 MG tablet Take by mouth.     amLODipine (NORVASC) 5 MG tablet Take 5 mg by mouth daily.      apixaban (ELIQUIS) 5 MG TABS tablet Two tabs po twice a day for seven days then 1 tab po twice a day  afterwards 74 tablet 0   budesonide-formoterol (SYMBICORT) 160-4.5 MCG/ACT inhaler Inhale 2 puffs into the lungs 2 (two) times daily.      Calcium Carbonate-Vitamin D3 (CALCIUM 600/VITAMIN D) 600-400 MG-UNIT TABS Take 2 tablets by mouth daily.     cetirizine (ZYRTEC) 10 MG tablet Take 10 mg by mouth at bedtime.      diclofenac sodium (VOLTAREN) 1 % GEL Apply topically 2 (two) times daily as needed (pain).     docusate sodium (COLACE) 100 MG capsule Take 1 capsule (100 mg total) by mouth 2 (two) times daily. 10 capsule 0   doxycycline (VIBRA-TABS) 100 MG tablet Take 1 tablet (100 mg total) by mouth 2 (two) times daily. 60 tablet 3   ferrous sulfate 325 (65 FE) MG tablet Take 325 mg by mouth 2 (two) times daily with a meal.     fexofenadine (ALLEGRA) 180 MG tablet Take 180 mg by mouth daily.      ipratropium-albuterol (DUONEB) 0.5-2.5 (3) MG/3ML SOLN Take 3 mLs by nebulization every 6 (six) hours as needed (shortness of breath or wheezing).     isosorbide mononitrate (IMDUR) 60 MG 24 hr tablet Take 180 mg by mouth daily.      losartan (COZAAR) 50 MG tablet Take 100 mg by mouth daily.      magnesium hydroxide (MILK OF MAGNESIA) 400 MG/5ML suspension Take 30 mLs by mouth every 4 (four) hours as needed. If constipation/ no BM for 2 days     mometasone (NASONEX) 50 MCG/ACT nasal spray Place 2 sprays into the nose daily as needed (allergies).      montelukast (SINGULAIR) 10 MG tablet Take 10 mg by mouth every evening.      omeprazole (PRILOSEC) 20 MG capsule      oxybutynin (DITROPAN) 5 MG tablet  sitaGLIPtin (JANUVIA) 50 MG tablet Take 50 mg by mouth daily.     spironolactone (ALDACTONE) 25 MG tablet Take 25 mg by mouth daily.      tiotropium (SPIRIVA) 18 MCG inhalation capsule Place 18 mcg into inhaler and inhale daily.     vitamin B-12 (CYANOCOBALAMIN) 1000 MCG tablet Take 1,000 mcg by mouth daily.     vitamin C (ASCORBIC ACID) 500 MG tablet Take 500 mg by mouth daily.      No current  facility-administered medications for this visit.     Abtx:  Anti-infectives (From admission, onward)    None       REVIEW OF SYSTEMS:  Const: negative fever, negative chills, negative weight loss Eyes: negative diplopia or visual changes, negative eye pain ENT: negative coryza, negative sore throat Resp: negative cough, hemoptysis, dyspnea Cards: negative for chest pain, palpitations, lower extremity edema GU: negative for frequency, dysuria and hematuria GI: Negative for abdominal pain, diarrhea, bleeding, constipation Skin:  pruritus Heme: negative for easy bruising and gum/nose bleeding MS: rt hip pain resolved Neurolo:negative for headaches, dizziness, vertigo, memory problems  Psych: negative for feelings of anxiety, depression  Endocrine: negative for thyroid, diabetes Allergy/Immunology-PCN rash  Objective:  VITALS:  BP (!) 178/81   Pulse (!) 54   Temp (!) 97.1 F (36.2 C) ( LDA Rt PICC PHYSICAL EXAM:  General: Alert, cooperative, no distress, appears stated age.  Head: Normocephalic, without obvious abnormality, atraumatic. Eyes: Conjunctivae clear, anicteric sclerae. Pupils are equal ENT Nares normal. No drainage or sinus tenderness. Lips, mucosa, and tongue normal. No Thrush Neck: Supple, symmetrical, no adenopathy, thyroid: non tender no carotid bruit and no JVD. Back: No CVA tenderness. Lungs: Clear to auscultation bilaterally. No Wheezing or Rhonchi. No rales. Heart: Regular rate and rhythm, no murmur, rub or gallop. Abdomen: Soft, non-tender,not distended. Bowel sounds normal. No masses Extremities:Normal  Rthip surgical site healed well Ambullates without any walker  Lymph: Cervical, supraclavicular normal. Neurologic: Grossly non-focal Pertinent Labs Lab Results  09/23/21 ESR 56 ( 100 on 09/16/21) CRP 10 ( range 0-10) 12/18/21 ESR 81 CRP 1.7    Microbiology: Joint capsule Actinomyces  Superficial layer - actinomyces Middle layer soft  tissue actinomyces  ? 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 LFTS normalized Started on Amoxicillin, but because of pruritus switched to Doxy after a month on amoxicillin. She continues to take Doxy and no side effects- she will need for a long time because of actino Use SPF 60 to prevent photosensitive reaction  H/o rt arm DVT related to PICC in Oct 2023 S/p mechanical thrombectomy and eliquis  DM on januvia   HTN on amlodipine  CKD  Discussed the management with patient? Labs today Follow up 3 months

## 2022-03-20 ENCOUNTER — Telehealth: Payer: Self-pay

## 2022-03-20 NOTE — Telephone Encounter (Signed)
-----   Message from Tsosie Billing, MD sent at 03/20/2022 10:48 AM EST ----- Hi, please let her know when you get a chance that the labs look good except for kidney- Cr is like before high- follow up with kidney doctor the infection marker labs ESR much better and crp normal- she is to continue Doxycycline as prescribed- thx ----- Message ----- From: Buel Ream, [facility] [place] Sent: 03/19/2022  10:43 AM EST To: Tsosie Billing, MD

## 2022-03-20 NOTE — Telephone Encounter (Signed)
I attempted to contact the patient to relay lab results. Patient did not answer and I left a VM for the patient to call back. Mackenzie Hill

## 2022-03-23 NOTE — Telephone Encounter (Signed)
Patient returned call, relayed per Dr. Delaine Lame that labs look good except that creatinine is elevated. Recommended she follow up with her kidney doctor. Discussed that inflammatory markers have improved and that Dr. Delaine Lame would like for her to continue doxycycline as prescribed. Patient verbalized understanding and has no further questions.   Beryle Flock, RN

## 2022-03-31 ENCOUNTER — Other Ambulatory Visit: Payer: Self-pay

## 2022-03-31 DIAGNOSIS — T8450XD Infection and inflammatory reaction due to unspecified internal joint prosthesis, subsequent encounter: Secondary | ICD-10-CM

## 2022-03-31 MED ORDER — DOXYCYCLINE HYCLATE 100 MG PO TABS: 100.0000 mg | ORAL_TABLET | Freq: Two times a day (BID) | ORAL | 3 refills | Status: DC

## 2022-04-16 ENCOUNTER — Ambulatory Visit (INDEPENDENT_AMBULATORY_CARE_PROVIDER_SITE_OTHER): Payer: Medicare PPO | Admitting: Podiatry

## 2022-04-16 ENCOUNTER — Encounter: Payer: Self-pay | Admitting: Podiatry

## 2022-04-16 DIAGNOSIS — M79675 Pain in left toe(s): Secondary | ICD-10-CM | POA: Diagnosis not present

## 2022-04-16 DIAGNOSIS — B351 Tinea unguium: Secondary | ICD-10-CM | POA: Diagnosis not present

## 2022-04-16 DIAGNOSIS — E1151 Type 2 diabetes mellitus with diabetic peripheral angiopathy without gangrene: Secondary | ICD-10-CM

## 2022-04-16 DIAGNOSIS — M79674 Pain in right toe(s): Secondary | ICD-10-CM

## 2022-04-16 NOTE — Progress Notes (Signed)
This patient presents to my office for at risk foot care.  This patient requires this care by a professional since this patient will be at risk due to having diabetes and CKD. Patient is also coagulation defect due to eliquis.This patient is unable to cut nails herself since the patient cannot reach her nails.These nails are painful walking and wearing shoes.  This patient presents for at risk foot care today.  General Appearance  Alert, conversant and in no acute stress.  Vascular  Dorsalis pedis are  weakly palpable  bilaterally.  Posterior tibial pulses are absent  B/L. Capillary return is within normal limits  bilaterally. Temperature is within normal limits  bilaterally.  Neurologic  Senn-Weinstein monofilament wire test within normal limits  bilaterally. Muscle power within normal limits bilaterally.  Nails Thick disfigured discolored nails with subungual debris  from hallux to fifth toes bilaterally. No evidence of bacterial infection or drainage bilaterally.  Orthopedic  No limitations of motion  feet .  No crepitus or effusions noted.  No bony pathology or digital deformities noted.  Skin  normotropic skin with no porokeratosis noted bilaterally.  No signs of infections or ulcers noted.     Onychomycosis  Pain in right toes  Pain in left toes  Consent was obtained for treatment procedures.   Mechanical debridement of nails 1-5  bilaterally performed with a nail nipper.  Filed with dremel without incident.    Return office visit     3 months                 Told patient to return for periodic foot care and evaluation due to potential at risk complications.   Jeyson Deshotel DPM   

## 2022-05-25 ENCOUNTER — Ambulatory Visit (INDEPENDENT_AMBULATORY_CARE_PROVIDER_SITE_OTHER): Payer: Medicare PPO | Admitting: Nurse Practitioner

## 2022-05-25 ENCOUNTER — Ambulatory Visit (INDEPENDENT_AMBULATORY_CARE_PROVIDER_SITE_OTHER): Payer: Medicare PPO

## 2022-05-25 VITALS — BP 141/74 | HR 51 | Resp 17 | Ht 63.0 in | Wt 176.4 lb

## 2022-05-25 DIAGNOSIS — E11649 Type 2 diabetes mellitus with hypoglycemia without coma: Secondary | ICD-10-CM

## 2022-05-25 DIAGNOSIS — I10 Essential (primary) hypertension: Secondary | ICD-10-CM | POA: Diagnosis not present

## 2022-05-25 DIAGNOSIS — I82A11 Acute embolism and thrombosis of right axillary vein: Secondary | ICD-10-CM

## 2022-05-25 MED ORDER — APIXABAN 2.5 MG PO TABS: 2.5000 mg | ORAL_TABLET | Freq: Two times a day (BID) | ORAL | 6 refills | Status: DC

## 2022-05-26 ENCOUNTER — Encounter (INDEPENDENT_AMBULATORY_CARE_PROVIDER_SITE_OTHER): Payer: Self-pay | Admitting: Nurse Practitioner

## 2022-05-26 NOTE — Progress Notes (Signed)
Subjective:    Patient ID: Mackenzie Hill, female    DOB: Jun 09, 1940, 82 y.o.   MRN: [id number] Chief Complaint  Patient presents with   Follow-up    History of Present Illness:    Procedure 10/23/2021: 1.  Mechanical thrombectomy of the right brachial vein, axillary vein and subclavian vein 2.    Percutaneous transluminal angioplasty of the right subclavian vein to 8 mm.   She presented to [facility] 10/22/2021 for right upper extremity arm swelling.  She had an infected prosthesis and was managed with IV antibiotics via PICC line.  She is followed by infectious disease who noted that her right upper extremity was markedly swollen and non invasive studies revealed RUE DVT involving the subclavian, axillary and brachial veins.    She notes since the procedure her arm has been feeling much better.  She denies pain.  She denies any significant swelling.  Today, noninvasive studies show complete resolution of the right upper extremity DVT.     Review of Systems  All other systems reviewed and are negative.      Objective:   Physical Exam Vitals reviewed.  HENT:     Head: Normocephalic.  Cardiovascular:     Rate and Rhythm: Normal rate.     Pulses: Normal pulses.  Pulmonary:     Effort: Pulmonary effort is normal.  Skin:    General: Skin is warm and dry.  Neurological:     Mental Status: She is alert and oriented to person, place, and time.  Psychiatric:        Mood and Affect: Mood normal.        Behavior: Behavior normal.        Thought Content: Thought content normal.        Judgment: Judgment normal.     BP (!) 141/74 (BP Location: Left Arm)   Pulse (!) 51   Resp 17   Ht 5\' 3"  (1.6 m)   Wt 176 lb 6.4 oz (80 kg)   LMP 05/28/1969 (Approximate) Comment: hysterectomy  BMI 31.25 kg/m   Past Medical History:  Diagnosis Date   Anemia    Anemia, unspecified    Hgb 11.8 - 04/2013   Arthritis    Cataract    Bilateral   CHF (congestive heart failure) (HCC)    Chronic  kidney disease    COPD (chronic obstructive pulmonary disease) (HCC)    DDD (degenerative disc disease), lumbar    Degenerative arthritis    Diabetes mellitus type 2, uncomplicated (HCC)    AODM (A1C 6.9%) 04/2013   Diabetes mellitus without complication (HCC)    Dyspnea    with exertion   Esophagitis    Gout    Uric acid 5.4 - 08/2012   Hypertension    Obesity, unspecified    Sarcoidosis, lung (HCC)    reported by pt, Clinically without a biopsy   Sleep apnea    OSA--Use C-PAP    Social History   Socioeconomic History   Marital status: Divorced    Spouse name: Not on file   Number of children: 1   Years of education: 2 years college   Highest education level: Not on file  Occupational History   Occupation: retired    Comment: worked at [employer]  Tobacco Use   Smoking status: Former    Packs/day: 0.70    Years: 3.00    Additional pack years: 0.00    Total pack years: 2.10    Types:  Cigarettes    Start date: 01/12/1962    Quit date: 01/12/1966    Years since quitting: 56.4   Smokeless tobacco: Never  Vaping Use   Vaping Use: Never used  Substance and Sexual Activity   Alcohol use: No    Alcohol/week: 0.0 standard drinks of alcohol   Drug use: No   Sexual activity: Not on file  Other Topics Concern   Not on file  Social History Narrative   Not on file   Social Determinants of Health   Financial Resource Strain: Not on file  Food Insecurity: No Food Insecurity (10/22/2021)   Hunger Vital Sign    Worried About Running Out of Food in the Last Year: Never true    Ran Out of Food in the Last Year: Never true  Transportation Needs: No Transportation Needs (10/22/2021)   PRAPARE - Administrator, Civil Service (Medical): No    Lack of Transportation (Non-Medical): No  Physical Activity: Not on file  Stress: Not on file  Social Connections: Not on file  Intimate Partner Violence: Not At Risk (10/22/2021)   Humiliation, Afraid, Rape, and Kick questionnaire     Fear of Current or Ex-Partner: No    Emotionally Abused: No    Physically Abused: No    Sexually Abused: No    Past Surgical History:  Procedure Laterality Date   ABDOMINAL HYSTERECTOMY     age 59   ANTERIOR HIP REVISION Right 09/04/2021   Procedure: ANTERIOR HIP REVISION;  Surgeon: Kennedy Bucker, MD;  Location: [facility] ORS;  Service: Orthopedics;  Laterality: Right;   APPLICATION OF WOUND VAC Right 09/04/2021   Procedure: APPLICATION OF WOUND VAC;  Surgeon: Kennedy Bucker, MD;  Location: [facility] ORS;  Service: Orthopedics;  Laterality: Right;   CATARACT EXTRACTION Bilateral    COLONOSCOPY  11/02/2005   Hyperplastic Polyp: CBF 10/2015; Recall Ltr mailed 08/27/2015 (dw)   FOOT SURGERY Right    HALLUX VALGUS REPAIR Right    OOPHORECTOMY     PERIPHERAL VASCULAR THROMBECTOMY Right 10/23/2021   Procedure: PERIPHERAL VASCULAR THROMBECTOMY;  Surgeon: Renford Dills, MD;  Location: [facility];  Service: Cardiovascular;  Laterality: Right;   TOTAL HIP ARTHROPLASTY 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   Procedure: TOTAL HIP ARTHROPLASTY ANTERIOR APPROACH;  Surgeon: Kennedy Bucker, MD;  Location: [facility] ORS;  Service: Orthopedics;  Laterality: Right;    Family History  Problem Relation Age of Onset   Diabetes Mother        ovarian cancer   Hypertension Mother    Ovarian cancer Mother    Diabetes Father        hardening of arteries   Heart disease Father    Hypertension Father    Diabetes Sister    Heart disease Sister    Diabetes Brother    Heart disease Brother     Allergies  Allergen Reactions   Ceftriaxone     Abnormal LFTS    Hydralazine Itching   Meloxicam Other (See Comments)    Causes excessive sleepiness fainting   Pioglitazone Itching   Tramadol Other (See Comments)    Causes excessive sleepiness Dizzy/fainting       Latest Ref Rng & Units 03/19/2022   10:36 AM 12/18/2021   10:55 AM 10/24/2021    6:47 AM  CBC  WBC 4.0 - 10.5 K/uL 6.2  7.3  5.8   Hemoglobin 12.0 - 15.0 g/dL  16.1  09.6  9.1   Hematocrit 36.0 - 46.0 % 32.1  32.9  27.8   Platelets 150 - 400 K/uL 252  348  333       CMP     Component Value Date/Time   NA 136 03/19/2022 1036   NA 138 01/08/2014 1109   K 3.9 03/19/2022 1036   K 3.8 01/08/2014 1109   CL 111 03/19/2022 1036   CL 103 01/08/2014 1109   CO2 17 (L) 03/19/2022 1036   CO2 27 01/08/2014 1109   GLUCOSE 142 (H) 03/19/2022 1036   GLUCOSE 304 (H) 01/08/2014 1109   BUN 45 (H) 03/19/2022 1036   BUN 29 (H) 01/08/2014 1109   CREATININE 2.21 (H) 03/19/2022 1036   CREATININE 1.66 (H) 01/08/2014 1109   CALCIUM 9.3 03/19/2022 1036   CALCIUM 8.3 (L) 01/08/2014 1109   PROT 7.0 03/19/2022 1036   PROT 6.3 (L) 01/08/2014 1109   ALBUMIN 3.5 03/19/2022 1036   ALBUMIN 3.0 (L) 01/08/2014 1109   AST 25 03/19/2022 1036   AST 17 01/08/2014 1109   ALT 28 03/19/2022 1036   ALT 54 01/08/2014 1109   ALKPHOS 119 03/19/2022 1036   ALKPHOS 130 (H) 01/08/2014 1109   BILITOT 0.6 03/19/2022 1036   BILITOT 0.5 01/08/2014 1109   GFRNONAA 22 (L) 03/19/2022 1036   GFRNONAA 32 (L) 01/08/2014 1109   GFRAA 26 (L) 07/23/2016 0334   GFRAA 39 (L) 01/08/2014 1109     No results found.     Assessment & Plan:   1. Acute deep vein thrombosis (DVT) of axillary vein of right upper extremity (HCC) The patient's DVT was felt to be provoked due to the placement of the PICC line.  The patient has done approximately 6 months coagulation without any significant issues.  Following discussion with the patient we will have the patient progressed to 2.5 mg twice a day versus the 5 mg twice daily.  She does well with this we may consider cessation at next follow-up  2. Essential (primary) hypertension Continue antihypertensive medications as already ordered, these medications have been reviewed and there are no changes at this time.  3. Type 2 diabetes mellitus with hypoglycemia without coma, without long-term current use of insulin (HCC) Continue hypoglycemic  medications as already ordered, these medications have been reviewed and there are no changes at this time.  Hgb A1C to be monitored as already arranged by primary service   Current Outpatient Medications on File Prior to Visit  Medication Sig Dispense Refill   albuterol (PROVENTIL HFA;VENTOLIN HFA) 108 (90 Base) MCG/ACT inhaler Inhale 2 puffs into the lungs every 6 (six) hours as needed for wheezing or shortness of breath. 1 Inhaler 0   allopurinol (ZYLOPRIM) 100 MG tablet Take by mouth.     amLODipine (NORVASC) 5 MG tablet Take 5 mg by mouth daily.      budesonide-formoterol (SYMBICORT) 160-4.5 MCG/ACT inhaler Inhale 2 puffs into the lungs 2 (two) times daily.      Calcium Carbonate-Vitamin D3 (CALCIUM 600/VITAMIN D) 600-400 MG-UNIT TABS Take 2 tablets by mouth daily.     cetirizine (ZYRTEC) 10 MG tablet Take 10 mg by mouth at bedtime.      diclofenac sodium (VOLTAREN) 1 % GEL Apply topically 2 (two) times daily as needed (pain).     docusate sodium (COLACE) 100 MG capsule Take 1 capsule (100 mg total) by mouth 2 (two) times daily. 10 capsule 0   doxycycline (VIBRA-TABS) 100 MG tablet Take 1 tablet (100 mg total) by mouth 2 (two) times daily. 60 tablet 3  ferrous sulfate 325 (65 FE) MG tablet Take 325 mg by mouth 2 (two) times daily with a meal.     fexofenadine (ALLEGRA) 180 MG tablet Take 180 mg by mouth daily.      isosorbide mononitrate (IMDUR) 60 MG 24 hr tablet Take 180 mg by mouth daily.      losartan (COZAAR) 50 MG tablet Take 100 mg by mouth daily.      magnesium hydroxide (MILK OF MAGNESIA) 400 MG/5ML suspension Take 30 mLs by mouth every 4 (four) hours as needed. If constipation/ no BM for 2 days     mometasone (NASONEX) 50 MCG/ACT nasal spray Place 2 sprays into the nose daily as needed (allergies).      montelukast (SINGULAIR) 10 MG tablet Take 10 mg by mouth every evening.      omeprazole (PRILOSEC) 20 MG capsule      oxybutynin (DITROPAN) 5 MG tablet      sitaGLIPtin  (JANUVIA) 50 MG tablet Take 50 mg by mouth daily.     spironolactone (ALDACTONE) 25 MG tablet Take 25 mg by mouth daily.      tiotropium (SPIRIVA) 18 MCG inhalation capsule Place 18 mcg into inhaler and inhale daily.     vitamin B-12 (CYANOCOBALAMIN) 1000 MCG tablet Take 1,000 mcg by mouth daily.     vitamin C (ASCORBIC ACID) 500 MG tablet Take 500 mg by mouth daily.      No current facility-administered medications on file prior to visit.    There are no Patient Instructions on file for this visit. No follow-ups on file.   Georgiana Spinner, NP

## 2022-06-22 ENCOUNTER — Other Ambulatory Visit (INDEPENDENT_AMBULATORY_CARE_PROVIDER_SITE_OTHER): Payer: Self-pay | Admitting: Nurse Practitioner

## 2022-06-22 MED ORDER — APIXABAN 2.5 MG PO TABS: 2.5000 mg | ORAL_TABLET | Freq: Two times a day (BID) | ORAL | 6 refills | Status: AC

## 2022-06-23 ENCOUNTER — Ambulatory Visit: Payer: Medicare PPO | Attending: Infectious Diseases | Admitting: Infectious Diseases

## 2022-06-23 ENCOUNTER — Other Ambulatory Visit
Admission: RE | Admit: 2022-06-23 | Discharge: 2022-06-23 | Disposition: A | Discharge status: Home / Self Care | Payer: Medicare PPO | Source: Ambulatory Visit | Attending: Infectious Diseases | Admitting: Infectious Diseases

## 2022-06-23 ENCOUNTER — Encounter: Payer: Self-pay | Admitting: Infectious Diseases

## 2022-06-23 VITALS — BP 162/73 | HR 57 | Temp 98.0°F | Ht 63.0 in | Wt 174.0 lb

## 2022-06-23 DIAGNOSIS — E1122 Type 2 diabetes mellitus with diabetic chronic kidney disease: Secondary | ICD-10-CM | POA: Diagnosis not present

## 2022-06-23 DIAGNOSIS — T8450XD Infection and inflammatory reaction due to unspecified internal joint prosthesis, subsequent encounter: Secondary | ICD-10-CM | POA: Insufficient documentation

## 2022-06-23 DIAGNOSIS — Z7901 Long term (current) use of anticoagulants: Secondary | ICD-10-CM | POA: Diagnosis not present

## 2022-06-23 DIAGNOSIS — Z792 Long term (current) use of antibiotics: Secondary | ICD-10-CM | POA: Diagnosis not present

## 2022-06-23 DIAGNOSIS — N189 Chronic kidney disease, unspecified: Secondary | ICD-10-CM | POA: Diagnosis not present

## 2022-06-23 DIAGNOSIS — Z87891 Personal history of nicotine dependence: Secondary | ICD-10-CM | POA: Diagnosis not present

## 2022-06-23 DIAGNOSIS — T8452XD Infection and inflammatory reaction due to internal left hip prosthesis, subsequent encounter: Secondary | ICD-10-CM

## 2022-06-23 DIAGNOSIS — I509 Heart failure, unspecified: Secondary | ICD-10-CM | POA: Insufficient documentation

## 2022-06-23 DIAGNOSIS — Z96641 Presence of right artificial hip joint: Secondary | ICD-10-CM | POA: Insufficient documentation

## 2022-06-23 DIAGNOSIS — A429 Actinomycosis, unspecified: Secondary | ICD-10-CM | POA: Diagnosis not present

## 2022-06-23 DIAGNOSIS — T8451XD Infection and inflammatory reaction due to internal right hip prosthesis, subsequent encounter: Secondary | ICD-10-CM | POA: Insufficient documentation

## 2022-06-23 DIAGNOSIS — Z86718 Personal history of other venous thrombosis and embolism: Secondary | ICD-10-CM | POA: Diagnosis not present

## 2022-06-23 DIAGNOSIS — Z7984 Long term (current) use of oral hypoglycemic drugs: Secondary | ICD-10-CM | POA: Diagnosis not present

## 2022-06-23 DIAGNOSIS — I130 Hypertensive heart and chronic kidney disease with heart failure and stage 1 through stage 4 chronic kidney disease, or unspecified chronic kidney disease: Secondary | ICD-10-CM | POA: Insufficient documentation

## 2022-06-23 DIAGNOSIS — Z79899 Other long term (current) drug therapy: Secondary | ICD-10-CM | POA: Insufficient documentation

## 2022-06-23 LAB — COMPREHENSIVE METABOLIC PANEL
ALT: 29 U/L (ref 0–44)
AST: 28 U/L (ref 15–41)
Albumin: 4.4 g/dL (ref 3.5–5.0)
Alkaline Phosphatase: 124 U/L (ref 38–126)
Anion gap: 10 (ref 5–15)
BUN: 52 mg/dL — ABNORMAL HIGH (ref 8–23)
CO2: 18 mmol/L — ABNORMAL LOW (ref 22–32)
Calcium: 9.9 mg/dL (ref 8.9–10.3)
Chloride: 107 mmol/L (ref 98–111)
Creatinine, Ser: 2.16 mg/dL — ABNORMAL HIGH (ref 0.44–1.00)
GFR, Estimated: 22 mL/min — ABNORMAL LOW (ref >60)
Glucose, Bld: 149 mg/dL — ABNORMAL HIGH (ref 70–99)
Potassium: 4.2 mmol/L (ref 3.5–5.1)
Sodium: 135 mmol/L (ref 135–145)
Total Bilirubin: 0.8 mg/dL (ref 0.3–1.2)
Total Protein: 8.2 g/dL — ABNORMAL HIGH (ref 6.5–8.1)

## 2022-06-23 LAB — CBC WITH DIFFERENTIAL/PLATELET
Abs Immature Granulocytes: 0.03 10*3/uL (ref 0.00–0.07)
Basophils Absolute: 0 10*3/uL (ref 0.0–0.1)
Basophils Relative: 1 %
Eosinophils Absolute: 0.1 10*3/uL (ref 0.0–0.5)
Eosinophils Relative: 1 %
HCT: 37 % (ref 36.0–46.0)
Hemoglobin: 11.8 g/dL — ABNORMAL LOW (ref 12.0–15.0)
Immature Granulocytes: 1 %
Lymphocytes Relative: 21 %
Lymphs Abs: 1.3 10*3/uL (ref 0.7–4.0)
MCH: 29.9 pg (ref 26.0–34.0)
MCHC: 31.9 g/dL (ref 30.0–36.0)
MCV: 93.7 fL (ref 80.0–100.0)
Monocytes Absolute: 0.4 10*3/uL (ref 0.1–1.0)
Monocytes Relative: 6 %
Neutro Abs: 4.5 10*3/uL (ref 1.7–7.7)
Neutrophils Relative %: 70 %
Platelets: 283 10*3/uL (ref 150–400)
RBC: 3.95 MIL/uL (ref 3.87–5.11)
RDW: 13.9 % (ref 11.5–15.5)
WBC: 6.3 10*3/uL (ref 4.0–10.5)
nRBC: 0 % (ref 0.0–0.2)

## 2022-06-23 LAB — SEDIMENTATION RATE: Sed Rate: 43 mm/hr — ABNORMAL HIGH (ref 0–30)

## 2022-06-23 LAB — C-REACTIVE PROTEIN: CRP: <0.5 mg/dL (ref <1.0)

## 2022-06-23 MED ORDER — DOXYCYCLINE HYCLATE 100 MG PO TABS: 100.0000 mg | ORAL_TABLET | Freq: Two times a day (BID) | ORAL | 3 refills | Status: DC

## 2022-06-23 NOTE — Progress Notes (Signed)
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 female with a history of  DM, HTN, Gout, CKD rt hip replacement in 2018 was recently in the hospital with rt hip pain, actinomyces PJI  She had Rt hip replacement in 2018 She always had pain after the surgery 5 years ago, but in June 2023 noted a dark tender spot on the surgical scar- Her PCP gave her levaquin X 10 days on 06/20/21 -wbc was normal  - She says the levaquin improved the pain some what but only to recur She saw Dr.Menz , ortho on 07/07/21. He sent ESR/CRP  ESR was 60 and CRP N  On 08/07/21 she saw surgeon and he did not think there was any thigh abscess She had MRI on 08/20/21 and it showed Well-defined periprosthetic fluid collection along the lateral margin of the right femoral neck extends through the anterior compartment musculature of the proximal right thigh along its the expected site of prior surgical incision. Collection extends into the overlying subcutaneous soft tissues at the anterolateral aspect of the thigh. Although difficult to measure given the elongated, slightly serpiginous course of the collection, the collection measures approximately 11 cm in length. There is no obvious connection to the overlying skin surfac Pt returned to see Dr.Menz on 8/21/with worsening pain and rt hip was aspirated and showed 16K wbc with 90% N- crystals neg- no culture sent She was taken for surgery Dr.Menz's noteread  :" Prior incision was opened at the bottom of the incision distally there is  approximately 2 cm area of necrotic fat which was cultured.  Going deep to this the deep fascia was incised and the tensor muscle retracted posteriorly and the joint capsule exposed with again tissue cultures obtained when opening the capsule fluid was also cultured from the joint fluid but it did not appear very inflammatory.  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 ( initially on day of discharge only the superficial cultures were positive and later the joint capsule and synovial fluid were positive as well)  She was discharged home on IV ceftrixaone She had elevated LFTS thought to be due to ceftriaxone and it was dc on 09/26/21 and she had a few days off meds and a repeat test in 4 days showed normalization of the LFTS- she went on ertapenem ( with a couple of days of Po doxy) She had swelling of rt arm and  Doppler US showed DVT of rt arm and she was hospitalized 10/21/21-10/24/21 and underwent thrombectomy- She was discharged on eliquis- She completed 6 weeks of IV on 10/24/11 and was tested for PO augmentin which she  tolerated well and sent home on that for 4 months- on 12/01/21 she was c/o itching and augmentin was switched to Doxy and she has been on it since then with no complaints    Past Medical History:  Diagnosis Date   Anemia    Anemia, unspecified    Hgb 11.8 - 04/2013   Arthritis    Cataract    Bilateral   CHF (congestive heart failure) (HCC)    Chronic kidney disease    COPD (chronic obstructive pulmonary disease) (HCC)    DDD (degenerative disc disease), lumbar    Degenerative arthritis    Diabetes mellitus type 2, uncomplicated (HCC)     AODM (A1C 6.9%) 04/2013   Diabetes mellitus without complication (HCC)    Dyspnea    with exertion   Esophagitis    Gout    Uric acid 5.4 - 08/2012   Hypertension    Obesity, unspecified    Sarcoidosis, lung (HCC)    reported by pt, Clinically without a biopsy   Sleep apnea    OSA--Use C-PAP    Past Surgical History:  Procedure Laterality Date   ABDOMINAL HYSTERECTOMY     age 56   ANTERIOR HIP REVISION Right 09/04/2021   Procedure: ANTERIOR HIP REVISION;  Surgeon: Kennedy Bucker, MD;  Location: [facility];  Service: Orthopedics;  Laterality: Right;   APPLICATION OF WOUND VAC Right 09/04/2021   Procedure: APPLICATION OF WOUND VAC;  Surgeon: Kennedy Bucker, MD;  Location: [facility];  Service: Orthopedics;  Laterality: Right;   CATARACT EXTRACTION Bilateral    COLONOSCOPY  11/02/2005   Hyperplastic Polyp: CBF 10/2015; Recall Ltr mailed 08/27/2015 (dw)   FOOT SURGERY Right    HALLUX VALGUS REPAIR Right    OOPHORECTOMY     PERIPHERAL VASCULAR THROMBECTOMY Right 10/23/2021   Procedure: PERIPHERAL VASCULAR THROMBECTOMY;  Surgeon: Renford Dills, MD;  Location: [facility];  Service: Cardiovascular;  Laterality: Right;   TOTAL HIP ARTHROPLASTY 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   Procedure: TOTAL HIP ARTHROPLASTY ANTERIOR APPROACH;  Surgeon: Kennedy Bucker, MD;  Location: [facility];  Service: Orthopedics;  Laterality: Right;    Social History   Socioeconomic History   Marital status: Divorced    Spouse name: Not on file   Number of children: 1   Years of education: 2 years college   Highest education level: Not on file  Occupational History   Occupation: retired    Comment: worked at [employer]  Tobacco Use   Smoking status: Former    Packs/day: 0.70    Years: 3.00    Additional pack years: 0.00    Total pack years: 2.10    Types: Cigarettes    Start date: 01/12/1962    Quit date: 01/12/1966    Years since quitting: 56.4   Smokeless tobacco: Never  Vaping Use   Vaping Use: Never used   Substance and Sexual Activity   Alcohol use: No    Alcohol/week: 0.0 standard drinks of alcohol   Drug use: No   Sexual activity: Not on file  Other Topics Concern   Not on file  Social History Narrative   Not on file   Social Determinants of Health   Financial Resource Strain: Not on file  Food Insecurity: No Food Insecurity (10/22/2021)   Hunger Vital Sign    Worried About Running Out of Food in the Last Year: Never true    Ran Out of Food in the Last Year: Never true  Transportation Needs: No  Transportation Needs (10/22/2021)   PRAPARE - Administrator, Civil Service (Medical): No    Lack of Transportation (Non-Medical): No  Physical Activity: Not on file  Stress: Not on file  Social Connections: Not on file  Intimate Partner Violence: Not At Risk (10/22/2021)   Humiliation, Afraid, Rape, and Kick questionnaire    Fear of Current or Ex-Partner: No    Emotionally Abused: No    Physically Abused: No    Sexually Abused: No    Family History  Problem Relation Age of Onset   Diabetes Mother        ovarian cancer   Hypertension Mother    Ovarian cancer Mother    Diabetes Father        hardening of arteries   Heart disease Father    Hypertension Father    Diabetes Sister    Heart disease Sister    Diabetes Brother    Heart disease Brother    Allergies  Allergen Reactions   Ceftriaxone     Abnormal LFTS    Hydralazine Itching   Meloxicam Other (See Comments)    Causes excessive sleepiness fainting   Pioglitazone Itching   Tramadol Other (See Comments)    Causes excessive sleepiness Dizzy/fainting   I? Current Outpatient Medications  Medication Sig Dispense Refill   albuterol (PROVENTIL HFA;VENTOLIN HFA) 108 (90 Base) MCG/ACT inhaler Inhale 2 puffs into the lungs every 6 (six) hours as needed for wheezing or shortness of breath. 1 Inhaler 0   allopurinol (ZYLOPRIM) 100 MG tablet Take by mouth.     amLODipine (NORVASC) 5 MG tablet Take 5 mg by  mouth daily.      apixaban (ELIQUIS) 2.5 MG TABS tablet Take 1 tablet (2.5 mg total) by mouth 2 (two) times daily. 60 tablet 6   budesonide-formoterol (SYMBICORT) 160-4.5 MCG/ACT inhaler Inhale 2 puffs into the lungs 2 (two) times daily.      Calcium Carbonate-Vitamin D3 (CALCIUM 600/VITAMIN D) 600-400 MG-UNIT TABS Take 2 tablets by mouth daily.     cetirizine (ZYRTEC) 10 MG tablet Take 10 mg by mouth at bedtime.      diclofenac sodium (VOLTAREN) 1 % GEL Apply topically 2 (two) times daily as needed (pain).     docusate sodium (COLACE) 100 MG capsule Take 1 capsule (100 mg total) by mouth 2 (two) times daily. 10 capsule 0   doxycycline (VIBRA-TABS) 100 MG tablet Take 1 tablet (100 mg total) by mouth 2 (two) times daily. 60 tablet 3   ferrous sulfate 325 (65 FE) MG tablet Take 325 mg by mouth 2 (two) times daily with a meal.     fexofenadine (ALLEGRA) 180 MG tablet Take 180 mg by mouth daily.      isosorbide mononitrate (IMDUR) 60 MG 24 hr tablet Take 180 mg by mouth daily.      losartan (COZAAR) 50 MG tablet Take 100 mg by mouth daily.      magnesium hydroxide (MILK OF MAGNESIA) 400 MG/5ML suspension Take 30 mLs by mouth every 4 (four) hours as needed. If constipation/ no BM for 2 days     mometasone (NASONEX) 50 MCG/ACT nasal spray Place 2 sprays into the nose daily as needed (allergies).      montelukast (SINGULAIR) 10 MG tablet Take 10 mg by mouth every evening.      omeprazole (PRILOSEC) 20 MG capsule      oxybutynin (DITROPAN) 5 MG tablet      sitaGLIPtin (JANUVIA) 50 MG tablet  Take 50 mg by mouth daily.     spironolactone (ALDACTONE) 25 MG tablet Take 25 mg by mouth daily.      tiotropium (SPIRIVA) 18 MCG inhalation capsule Place 18 mcg into inhaler and inhale daily.     vitamin B-12 (CYANOCOBALAMIN) 1000 MCG tablet Take 1,000 mcg by mouth daily.     vitamin C (ASCORBIC ACID) 500 MG tablet Take 500 mg by mouth daily.      No current facility-administered medications for this visit.      Abtx:  Anti-infectives (From admission, onward)    None       REVIEW OF SYSTEMS:  Const: negative fever, negative chills, negative weight loss Eyes: negative diplopia or visual changes, negative eye pain ENT: negative coryza, negative sore throat Resp: negative cough, hemoptysis, dyspnea Cards: negative for chest pain, palpitations, lower extremity edema GU: negative for frequency, dysuria and hematuria GI: Negative for abdominal pain, diarrhea, bleeding, constipation Skin:  pruritus Heme: negative for easy bruising and gum/nose bleeding MS: rt hip pain resolved Neurolo:negative for headaches, dizziness, vertigo, memory problems  Psych: negative for feelings of anxiety, depression  Endocrine: negative for thyroid, diabetes Allergy/Immunology-PCN rash  Objective:  VITALS:  BP (!) 178/81   Pulse (!) 54   Temp (!) 97.1 F (36.2 C) ( LDA Rt PICC PHYSICAL EXAM:  General: Alert, cooperative, no distress, appears stated age.  Head: Normocephalic, without obvious abnormality, atraumatic. Eyes: Conjunctivae clear, anicteric sclerae. Pupils are equal ENT Nares normal. No drainage or sinus tenderness. Lips, mucosa, and tongue normal. No Thrush Neck: Supple, symmetrical, no adenopathy, thyroid: non tender no carotid bruit and no JVD. Back: No CVA tenderness. Lungs: Clear to auscultation bilaterally. No Wheezing or Rhonchi. No rales. Heart: Regular rate and rhythm, no murmur, rub or gallop. Abdomen: Soft, non-tender,not distended. Bowel sounds normal. No masses Extremities:Normal  Rthip surgical site healed well Ambullates without any walker  Lymph: Cervical, supraclavicular normal. Neurologic: Grossly non-focal Pertinent Labs Lab Results  09/23/21 ESR 56 ( 100 on 09/16/21) CRP 10 ( range 0-10) 12/18/21 ESR 81 CRP 1.7    Microbiology: Joint capsule Actinomyces  Superficial layer - actinomyces Middle layer soft tissue actinomyces  ? 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 LFTS normalized Started on Amoxicillin, but because of pruritus switched to Doxy Nov 2024. She continues to take Doxy and no side effects- she will need for a long time because of actino- she is 10 months into treatment Use SPF 60 to prevent photosensitive reaction  H/o rt arm DVT related to PICC in Oct 2023 S/p mechanical thrombectomy and currently on eliquis- dose reduced to 2.5 mg recently  DM on januvia   HTN on amlodipine  CKD last cr from March 2024 --2.4  Discussed the management with patient? Labs today Follow up 3 months

## 2022-07-13 ENCOUNTER — Ambulatory Visit: Payer: Medicare PPO | Admitting: Podiatry

## 2022-09-10 ENCOUNTER — Other Ambulatory Visit: Payer: Self-pay

## 2022-09-10 DIAGNOSIS — T8450XD Infection and inflammatory reaction due to unspecified internal joint prosthesis, subsequent encounter: Secondary | ICD-10-CM

## 2022-09-10 MED ORDER — DOXYCYCLINE HYCLATE 100 MG PO TABS: 100.0000 mg | ORAL_TABLET | Freq: Two times a day (BID) | ORAL | 0 refills | Status: DC

## 2022-09-24 ENCOUNTER — Inpatient Hospital Stay: Payer: Medicare PPO | Admitting: Infectious Diseases

## 2022-09-25 ENCOUNTER — Other Ambulatory Visit: Payer: Self-pay

## 2022-09-25 DIAGNOSIS — T8450XD Infection and inflammatory reaction due to unspecified internal joint prosthesis, subsequent encounter: Secondary | ICD-10-CM

## 2022-09-25 MED ORDER — DOXYCYCLINE HYCLATE 100 MG PO TABS: 100.0000 mg | ORAL_TABLET | Freq: Two times a day (BID) | ORAL | 0 refills | Status: DC

## 2022-09-29 ENCOUNTER — Inpatient Hospital Stay: Payer: Medicare PPO | Admitting: Infectious Diseases

## 2022-10-03 ENCOUNTER — Other Ambulatory Visit: Payer: Self-pay | Admitting: Infectious Diseases

## 2022-10-03 DIAGNOSIS — T8450XD Infection and inflammatory reaction due to unspecified internal joint prosthesis, subsequent encounter: Secondary | ICD-10-CM

## 2022-10-15 ENCOUNTER — Inpatient Hospital Stay: Payer: Medicare PPO | Admitting: Infectious Diseases

## 2022-10-20 ENCOUNTER — Inpatient Hospital Stay: Payer: Medicare PPO | Admitting: Infectious Diseases

## 2022-10-27 ENCOUNTER — Ambulatory Visit: Payer: Medicare PPO | Attending: Infectious Diseases | Admitting: Infectious Diseases

## 2022-10-27 ENCOUNTER — Encounter: Payer: Self-pay | Admitting: Infectious Diseases

## 2022-10-27 VITALS — BP 159/74 | HR 55 | Temp 97.2°F | Ht 63.0 in | Wt 178.0 lb

## 2022-10-27 DIAGNOSIS — Y798 Miscellaneous orthopedic devices associated with adverse incidents, not elsewhere classified: Secondary | ICD-10-CM | POA: Insufficient documentation

## 2022-10-27 DIAGNOSIS — E1122 Type 2 diabetes mellitus with diabetic chronic kidney disease: Secondary | ICD-10-CM | POA: Diagnosis not present

## 2022-10-27 DIAGNOSIS — Z7984 Long term (current) use of oral hypoglycemic drugs: Secondary | ICD-10-CM | POA: Insufficient documentation

## 2022-10-27 DIAGNOSIS — T8451XA Infection and inflammatory reaction due to internal right hip prosthesis, initial encounter: Secondary | ICD-10-CM | POA: Diagnosis not present

## 2022-10-27 DIAGNOSIS — Z86718 Personal history of other venous thrombosis and embolism: Secondary | ICD-10-CM | POA: Diagnosis not present

## 2022-10-27 DIAGNOSIS — Z7901 Long term (current) use of anticoagulants: Secondary | ICD-10-CM | POA: Diagnosis not present

## 2022-10-27 DIAGNOSIS — M00851 Arthritis due to other bacteria, right hip: Secondary | ICD-10-CM | POA: Insufficient documentation

## 2022-10-27 DIAGNOSIS — Y838 Other surgical procedures as the cause of abnormal reaction of the patient, or of later complication, without mention of misadventure at the time of the procedure: Secondary | ICD-10-CM | POA: Insufficient documentation

## 2022-10-27 DIAGNOSIS — I130 Hypertensive heart and chronic kidney disease with heart failure and stage 1 through stage 4 chronic kidney disease, or unspecified chronic kidney disease: Secondary | ICD-10-CM | POA: Insufficient documentation

## 2022-10-27 DIAGNOSIS — M109 Gout, unspecified: Secondary | ICD-10-CM | POA: Insufficient documentation

## 2022-10-27 DIAGNOSIS — Z96641 Presence of right artificial hip joint: Secondary | ICD-10-CM | POA: Diagnosis present

## 2022-10-27 DIAGNOSIS — T8451XD Infection and inflammatory reaction due to internal right hip prosthesis, subsequent encounter: Secondary | ICD-10-CM

## 2022-10-27 DIAGNOSIS — A429 Actinomycosis, unspecified: Secondary | ICD-10-CM | POA: Diagnosis not present

## 2022-10-27 DIAGNOSIS — T8450XD Infection and inflammatory reaction due to unspecified internal joint prosthesis, subsequent encounter: Secondary | ICD-10-CM

## 2022-10-27 DIAGNOSIS — N189 Chronic kidney disease, unspecified: Secondary | ICD-10-CM | POA: Insufficient documentation

## 2022-10-27 NOTE — Patient Instructions (Signed)
  During your visit today, we discussed your ongoing recovery from a right hip joint infection, your management of gout, and your slightly elevated blood pressure. You've been making good progress with your hip infection, and your mobility has improved. Your gout is stable with your current medication, and your blood pressure may have been elevated due to rushing. We will continue to monitor these conditions.  YOUR PLAN:  -RIGHT HIP JOINT INFECTION: Your hip infection has improved significantly due to your consistent use of antibiotics. We will check your blood for signs of inflammation, which can indicate an active infection. Depending on the results, we may consider stopping the antibiotics.  -GOUT: Your gout, a type of arthritis caused by excess uric acid in the bloodstream, is being well-managed with your current medication, Allopurinol. We will continue this treatment.  -HYPERTENSION: Your blood pressure was slightly high today, which could be due to rushing or recent medication intake. We will continue to monitor this at your next appointment.  INSTRUCTIONS:  Please continue taking your current medications as prescribed. This includes your Doxycycline 100mg  twice a day for your hip infection, Allopurinol for your gout, and Eliquis for blood clot prevention. You have a primary care appointment next week where your blood pressure will be checked. Please ask your PCP to check ESR/CRP/CBC/CMP. . We will also have a follow-up appointment in six months, will let you know once I have the results

## 2022-10-27 NOTE — Progress Notes (Signed)
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 She reports improvement in mobility, attributing the slow recovery to her age. She has been adhering to the prescribed antibiotic regimen, taking Doxycycline one dose in the morning and one in the evening. She recently saw her orthopedic doctor, who was satisfied with her progress and advised a follow-up in two years. She is scheduled to see her primary care doctor next week.  For her gout, she is taking allopurinol twice daily and reports that it is "doing okay." She also recently saw her podiatrist, who trimmed her nails. She has been taking Eliquis twice daily. She also reports a slightly elevated blood pressure, which she attributes to rushing due to traffic.   Following taken from previous note Mackenzie Hill is a 82 y.o. female with a history of  DM, HTN, Gout, CKD rt hip replacement in 2018 was recently in the hospital with rt hip pain, actinomyces PJI  She had Rt hip replacement in 2018 She always had pain after the surgery 5 years ago, but in June 2023 noted a dark tender spot on the surgical scar- Her PCP gave her levaquin X 10 days on 06/20/21 -wbc was normal  - She says the levaquin improved the pain some what but only to recur She saw Dr.Menz , ortho on 07/07/21. He sent ESR/CRP  ESR was 60 and CRP N  On 08/07/21 she saw surgeon and he did not think there was any thigh abscess She had MRI on 08/20/21 and it showed Well-defined periprosthetic fluid collection along the lateral margin of the right femoral neck extends through the anterior compartment musculature of the proximal right thigh along its the expected site of prior surgical incision. Collection extends into the overlying subcutaneous soft tissues at the anterolateral aspect  of the thigh. Although difficult to measure given the elongated, slightly serpiginous course of the collection, the collection measures approximately 11 cm in length. There is no obvious connection to the overlying skin surfac Pt returned to see Dr.Menz on 8/21/with worsening pain and rt hip was aspirated and showed 16K wbc with 90% N- crystals neg- no culture sent She was taken for surgery Dr.Menz's noteread  :" Prior incision was opened at the bottom of the incision distally there is approximately 2 cm area of necrotic fat which was cultured.  Going deep to this the deep fascia was incised and the tensor muscle retracted posteriorly and the joint capsule exposed with again tissue cultures obtained when opening the capsule fluid was also cultured from the joint fluid but it did not appear very inflammatory.  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 ( initially on day of discharge only the superficial cultures were positive and later the joint capsule and synovial fluid were positive as well)  She was discharged home on IV ceftrixaone She had elevated LFTS thought to be due to ceftriaxone and it was dc on 09/26/21 and she  had a few days off meds and a repeat test in 4 days showed normalization of the LFTS- she went on ertapenem ( with a couple of days of Po doxy) She had swelling of rt arm and  Doppler US showed DVT of rt arm and she was hospitalized 10/21/21-10/24/21 and underwent thrombectomy- She was discharged on eliquis- She completed 6 weeks of IV on 10/24/11 and was tested for PO augmentin which she tolerated well and sent home on that for 4 months- on 12/01/21  she was c/o itching and augmentin was switched to Doxy and she has been on it since then with no complaints    Past Medical History:  Diagnosis Date   Anemia    Anemia, unspecified    Hgb 11.8 - 04/2013   Arthritis    Cataract    Bilateral   CHF (congestive heart failure) (HCC)    Chronic kidney disease    COPD (chronic obstructive pulmonary disease) (HCC)    DDD (degenerative disc disease), lumbar    Degenerative arthritis    Diabetes mellitus type 2, uncomplicated (HCC)    AODM (A1C 6.9%) 04/2013   Diabetes mellitus without complication (HCC)    Dyspnea    with exertion   Esophagitis    Gout    Uric acid 5.4 - 08/2012   Hypertension    Obesity, unspecified    Sarcoidosis, lung (HCC)    reported by pt, Clinically without a biopsy   Sleep apnea    OSA--Use C-PAP    Past Surgical History:  Procedure Laterality Date   ABDOMINAL HYSTERECTOMY     age 52   ANTERIOR HIP REVISION Right 09/04/2021   Procedure: ANTERIOR HIP REVISION;  Surgeon: Kennedy Bucker, MD;  Location: [facility];  Service: Orthopedics;  Laterality: Right;   APPLICATION OF WOUND VAC Right 09/04/2021   Procedure: APPLICATION OF WOUND VAC;  Surgeon: Kennedy Bucker, MD;  Location: [facility];  Service: Orthopedics;  Laterality: Right;   CATARACT EXTRACTION Bilateral    COLONOSCOPY  11/02/2005   Hyperplastic Polyp: CBF 10/2015; Recall Ltr mailed 08/27/2015 (dw)   FOOT SURGERY Right    HALLUX VALGUS REPAIR Right    OOPHORECTOMY     PERIPHERAL VASCULAR THROMBECTOMY Right 10/23/2021   Procedure: PERIPHERAL VASCULAR THROMBECTOMY;  Surgeon: Renford Dills, MD;  Location: [facility];  Service: Cardiovascular;  Laterality: Right;   TOTAL HIP ARTHROPLASTY 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   Procedure: TOTAL HIP ARTHROPLASTY ANTERIOR APPROACH;  Surgeon: Kennedy Bucker, MD;  Location: [facility];  Service: Orthopedics;  Laterality: Right;    Social History   Socioeconomic History   Marital status: Divorced    Spouse name: Not on  file   Number of children: 1   Years of education: 2 years college   Highest education level: Not on file  Occupational History   Occupation: retired    Comment: worked at [employer]  Tobacco Use   Smoking status: Former    Current packs/day: 0.00    Average packs/day: 0.7 packs/day for 4.0 years (2.8 ttl pk-yrs)    Types: Cigarettes    Start date: 01/12/1962    Quit date: 01/12/1966    Years since quitting: 56.8   Smokeless tobacco: Never  Vaping Use   Vaping status: Never Used  Substance and Sexual Activity   Alcohol use: No    Alcohol/week: 0.0 standard drinks of alcohol   Drug use: No   Sexual activity: Not on file  Other Topics Concern  Not on file  Social History Narrative   Not on file   Social Determinants of Health   Financial Resource Strain: Not on file  Food Insecurity: No Food Insecurity (10/22/2021)   Hunger Vital Sign    Worried About Running Out of Food in the Last Year: Never true    Ran Out of Food in the Last Year: Never true  Transportation Needs: No Transportation Needs (10/22/2021)   PRAPARE - Administrator, Civil Service (Medical): No    Lack of Transportation (Non-Medical): No  Physical Activity: Not on file  Stress: Not on file  Social Connections: Not on file  Intimate Partner Violence: Not At Risk (10/22/2021)   Humiliation, Afraid, Rape, and Kick questionnaire    Fear of Current or Ex-Partner: No    Emotionally Abused: No    Physically Abused: No    Sexually Abused: No    Family History  Problem Relation Age of Onset   Diabetes Mother        ovarian cancer   Hypertension Mother    Ovarian cancer Mother    Diabetes Father        hardening of arteries   Heart disease Father    Hypertension Father    Diabetes Sister    Heart disease Sister    Diabetes Brother    Heart disease Brother    Allergies  Allergen Reactions   Ceftriaxone     Abnormal LFTS    Hydralazine Itching   Meloxicam Other (See Comments)    Causes  excessive sleepiness fainting   Pioglitazone Itching   Tramadol Other (See Comments)    Causes excessive sleepiness Dizzy/fainting   I? Current Outpatient Medications  Medication Sig Dispense Refill   albuterol (PROVENTIL HFA;VENTOLIN HFA) 108 (90 Base) MCG/ACT inhaler Inhale 2 puffs into the lungs every 6 (six) hours as needed for wheezing or shortness of breath. 1 Inhaler 0   allopurinol (ZYLOPRIM) 100 MG tablet Take by mouth.     amLODipine (NORVASC) 5 MG tablet Take 5 mg by mouth daily.      apixaban (ELIQUIS) 2.5 MG TABS tablet Take 1 tablet (2.5 mg total) by mouth 2 (two) times daily. 60 tablet 6   budesonide-formoterol (SYMBICORT) 160-4.5 MCG/ACT inhaler Inhale 2 puffs into the lungs 2 (two) times daily.      Calcium Carbonate-Vitamin D3 (CALCIUM 600/VITAMIN D) 600-400 MG-UNIT TABS Take 2 tablets by mouth daily.     cetirizine (ZYRTEC) 10 MG tablet Take 10 mg by mouth at bedtime.      diclofenac sodium (VOLTAREN) 1 % GEL Apply topically 2 (two) times daily as needed (pain).     docusate sodium (COLACE) 100 MG capsule Take 1 capsule (100 mg total) by mouth 2 (two) times daily. 10 capsule 0   doxycycline (VIBRA-TABS) 100 MG tablet TAKE 1 TABLET (100 MG TOTAL) BY MOUTH 2 (TWO) TIMES DAILY. 60 tablet 6   ferrous sulfate 325 (65 FE) MG tablet Take 325 mg by mouth 2 (two) times daily with a meal.     fexofenadine (ALLEGRA) 180 MG tablet Take 180 mg by mouth daily.      isosorbide mononitrate (IMDUR) 60 MG 24 hr tablet Take 180 mg by mouth daily.      magnesium hydroxide (MILK OF MAGNESIA) 400 MG/5ML suspension Take 30 mLs by mouth every 4 (four) hours as needed. If constipation/ no BM for 2 days     mometasone (NASONEX) 50 MCG/ACT nasal spray Place 2 sprays  into the nose daily as needed (allergies).      montelukast (SINGULAIR) 10 MG tablet Take 10 mg by mouth every evening.      omeprazole (PRILOSEC) 20 MG capsule      oxybutynin (DITROPAN) 5 MG tablet      sitaGLIPtin (JANUVIA) 50 MG  tablet Take 50 mg by mouth daily.     spironolactone (ALDACTONE) 25 MG tablet Take 25 mg by mouth daily.      tiotropium (SPIRIVA) 18 MCG inhalation capsule Place 18 mcg into inhaler and inhale daily.     vitamin B-12 (CYANOCOBALAMIN) 1000 MCG tablet Take 1,000 mcg by mouth daily.     vitamin C (ASCORBIC ACID) 500 MG tablet Take 500 mg by mouth daily.      No current facility-administered medications for this visit.     Abtx:  Anti-infectives (From admission, onward)    None       REVIEW OF SYSTEMS:  Const: negative fever, negative chills, negative weight loss Eyes: negative diplopia or visual changes, negative eye pain ENT: negative coryza, negative sore throat Resp: negative cough, hemoptysis, dyspnea Cards: negative for chest pain, palpitations, lower extremity edema GU: negative for frequency, dysuria and hematuria GI: Negative for abdominal pain, diarrhea, bleeding, constipation Skin:  N Heme: negative for easy bruising and gum/nose bleeding MS: rt hip pain resolved Neurolo:negative for headaches, dizziness, vertigo, memory problems  Psych: negative for feelings of anxiety, depression  Endocrine: negative for thyroid, diabetes Allergy/Immunology-PCN rash  Objective:  VITALS:  BP (!) 159/74   Pulse (!) 55   Temp (!) 97.2 F (36.2 C) (Temporal)   Ht 5\' 3"  (1.6 m)   Wt 178 lb (80.7 kg)   LMP 05/28/1969 (Approximate) Comment: hysterectomy  BMI 31.53 kg/m   PHYSICAL EXAM:  General: Alert, cooperative, no distress, appears stated age.  Head: Normocephalic, without obvious abnormality, atraumatic. Eyes: Conjunctivae clear, anicteric sclerae. Pupils are equal ENT Nares normal. No drainage or sinus tenderness. Lips, mucosa, and tongue normal. No Thrush Neck: Supple, symmetrical, no adenopathy, thyroid: non tender no carotid bruit and no JVD. Back: No CVA tenderness. Lungs: Clear to auscultation bilaterally. No Wheezing or Rhonchi. No rales. Heart: Regular rate and  rhythm, no murmur, rub or gallop. Abdomen: Soft, non-tender,not distended. Bowel sounds normal. No masses Extremities:Normal  Rthip surgical site healed well Ambullates well Lymph: Cervical, supraclavicular normal. Neurologic: Grossly non-focal Pertinent Labs Lab Results  09/23/21 ESR 56 ( 100 on 09/16/21) CRP 10 ( range 0-10) 12/18/21 ESR 81 CRP 1.7    Microbiology: Joint capsule Actinomyces  Superficial layer - actinomyces Middle layer soft tissue actinomyces  ? 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 LFTS normalized Started on Amoxicillin, but because of pruritus switched to Doxy Nov 2023. She continues to take Doxy and no side effects- month 12  Will do labs today Use SPF 60 to prevent photosensitive reaction  H/o rt arm DVT related to PICC in Oct 2023 S/p mechanical thrombectomy and currently on eliquis- dose reduced to 2.5 mg recently  DM on januvia   HTN on amlodipine High today but she says it is from rushing to the appt  CKD  Discussed the management with patient? Labs today Follow up 6months P.s ESR CRP CMP CBC  She will get it done at her [facility]

## 2022-10-29 ENCOUNTER — Ambulatory Visit (LOCAL_COMMUNITY_HEALTH_CENTER): Payer: Medicare PPO

## 2022-10-29 DIAGNOSIS — Z23 Encounter for immunization: Secondary | ICD-10-CM | POA: Diagnosis not present

## 2022-10-29 DIAGNOSIS — Z719 Counseling, unspecified: Secondary | ICD-10-CM

## 2022-10-29 NOTE — Progress Notes (Signed)
In nurse clinic for covid vaccine Moderna. VIS given. Tolerated well to left delt. No adverse effects noted after . [facility] updated and copy given to patient.

## 2022-11-27 ENCOUNTER — Telehealth: Payer: Self-pay

## 2022-11-27 NOTE — Telephone Encounter (Signed)
Patient called stating that her cardiologist took her off of Eliquis and wanted to inform Dr.Ravishankar. Patient questioned if she still needs to be on abx's. I don't see where patient has had labs done. Patient advised to have labs done and we will contact her with results when reviewed by Dr.Ravishankar.   Mystie Ormand Lesli Albee, CMA

## 2022-11-30 ENCOUNTER — Other Ambulatory Visit
Admission: RE | Admit: 2022-11-30 | Discharge: 2022-11-30 | Disposition: A | Discharge status: Home / Self Care | Payer: Medicare PPO | Attending: Infectious Diseases | Admitting: Infectious Diseases

## 2022-11-30 DIAGNOSIS — T8450XD Infection and inflammatory reaction due to unspecified internal joint prosthesis, subsequent encounter: Secondary | ICD-10-CM | POA: Insufficient documentation

## 2022-11-30 LAB — COMPREHENSIVE METABOLIC PANEL
ALT: 23 U/L (ref 0–44)
AST: 27 U/L (ref 15–41)
Albumin: 3.9 g/dL (ref 3.5–5.0)
Alkaline Phosphatase: 99 U/L (ref 38–126)
Anion gap: 9 (ref 5–15)
BUN: 25 mg/dL — ABNORMAL HIGH (ref 8–23)
CO2: 21 mmol/L — ABNORMAL LOW (ref 22–32)
Calcium: 9.3 mg/dL (ref 8.9–10.3)
Chloride: 109 mmol/L (ref 98–111)
Creatinine, Ser: 1.89 mg/dL — ABNORMAL HIGH (ref 0.44–1.00)
GFR, Estimated: 26 mL/min — ABNORMAL LOW (ref >60)
Glucose, Bld: 127 mg/dL — ABNORMAL HIGH (ref 70–99)
Potassium: 4 mmol/L (ref 3.5–5.1)
Sodium: 139 mmol/L (ref 135–145)
Total Bilirubin: 0.7 mg/dL (ref <1.2)
Total Protein: 7.5 g/dL (ref 6.5–8.1)

## 2022-11-30 LAB — C-REACTIVE PROTEIN: CRP: 0.5 mg/dL (ref <1.0)

## 2022-11-30 LAB — CBC WITH DIFFERENTIAL/PLATELET
Abs Immature Granulocytes: 0.03 10*3/uL (ref 0.00–0.07)
Basophils Absolute: 0 10*3/uL (ref 0.0–0.1)
Basophils Relative: 1 %
Eosinophils Absolute: 0.1 10*3/uL (ref 0.0–0.5)
Eosinophils Relative: 1 %
HCT: 33.7 % — ABNORMAL LOW (ref 36.0–46.0)
Hemoglobin: 11.4 g/dL — ABNORMAL LOW (ref 12.0–15.0)
Immature Granulocytes: 1 %
Lymphocytes Relative: 18 %
Lymphs Abs: 0.9 10*3/uL (ref 0.7–4.0)
MCH: 31.1 pg (ref 26.0–34.0)
MCHC: 33.8 g/dL (ref 30.0–36.0)
MCV: 91.8 fL (ref 80.0–100.0)
Monocytes Absolute: 0.4 10*3/uL (ref 0.1–1.0)
Monocytes Relative: 7 %
Neutro Abs: 3.9 10*3/uL (ref 1.7–7.7)
Neutrophils Relative %: 72 %
Platelets: 261 10*3/uL (ref 150–400)
RBC: 3.67 MIL/uL — ABNORMAL LOW (ref 3.87–5.11)
RDW: 13.6 % (ref 11.5–15.5)
WBC: 5.3 10*3/uL (ref 4.0–10.5)
nRBC: 0 % (ref 0.0–0.2)

## 2022-11-30 LAB — SEDIMENTATION RATE: Sed Rate: 34 mm/h — ABNORMAL HIGH (ref 0–30)

## 2022-12-01 NOTE — Telephone Encounter (Signed)
Patient advised to continue the doxy. Labs have been completed as well. Mackenzie Hill, CMA

## 2023-03-22 ENCOUNTER — Other Ambulatory Visit: Payer: Self-pay | Admitting: Infectious Diseases

## 2023-04-13 ENCOUNTER — Other Ambulatory Visit: Payer: Self-pay | Admitting: Infectious Diseases

## 2023-04-27 ENCOUNTER — Other Ambulatory Visit
Admission: RE | Admit: 2023-04-27 | Discharge: 2023-04-27 | Disposition: A | Discharge status: Home / Self Care | Attending: Infectious Diseases | Admitting: Infectious Diseases

## 2023-04-27 ENCOUNTER — Ambulatory Visit: Payer: Medicare PPO | Attending: Infectious Diseases | Admitting: Infectious Diseases

## 2023-04-27 ENCOUNTER — Encounter: Payer: Self-pay | Admitting: Infectious Diseases

## 2023-04-27 VITALS — BP 165/75 | HR 62 | Temp 97.3°F | Ht 63.0 in | Wt 176.0 lb

## 2023-04-27 DIAGNOSIS — Z96641 Presence of right artificial hip joint: Secondary | ICD-10-CM | POA: Diagnosis not present

## 2023-04-27 DIAGNOSIS — T8452XD Infection and inflammatory reaction due to internal left hip prosthesis, subsequent encounter: Secondary | ICD-10-CM

## 2023-04-27 DIAGNOSIS — E1122 Type 2 diabetes mellitus with diabetic chronic kidney disease: Secondary | ICD-10-CM | POA: Insufficient documentation

## 2023-04-27 DIAGNOSIS — Z87891 Personal history of nicotine dependence: Secondary | ICD-10-CM | POA: Diagnosis not present

## 2023-04-27 DIAGNOSIS — T8450XD Infection and inflammatory reaction due to unspecified internal joint prosthesis, subsequent encounter: Secondary | ICD-10-CM

## 2023-04-27 DIAGNOSIS — Z7901 Long term (current) use of anticoagulants: Secondary | ICD-10-CM | POA: Insufficient documentation

## 2023-04-27 DIAGNOSIS — Z86718 Personal history of other venous thrombosis and embolism: Secondary | ICD-10-CM | POA: Insufficient documentation

## 2023-04-27 DIAGNOSIS — M109 Gout, unspecified: Secondary | ICD-10-CM | POA: Insufficient documentation

## 2023-04-27 DIAGNOSIS — I130 Hypertensive heart and chronic kidney disease with heart failure and stage 1 through stage 4 chronic kidney disease, or unspecified chronic kidney disease: Secondary | ICD-10-CM | POA: Insufficient documentation

## 2023-04-27 DIAGNOSIS — Z5986 Financial insecurity: Secondary | ICD-10-CM | POA: Diagnosis not present

## 2023-04-27 DIAGNOSIS — N189 Chronic kidney disease, unspecified: Secondary | ICD-10-CM | POA: Diagnosis not present

## 2023-04-27 DIAGNOSIS — Z7984 Long term (current) use of oral hypoglycemic drugs: Secondary | ICD-10-CM | POA: Insufficient documentation

## 2023-04-27 LAB — C-REACTIVE PROTEIN: CRP: <0.5 mg/dL (ref <1.0)

## 2023-04-27 LAB — SEDIMENTATION RATE: Sed Rate: 49 mm/h — ABNORMAL HIGH (ref 0–30)

## 2023-04-27 NOTE — Patient Instructions (Signed)
 You have been on antibiotics for 18 months for the rt hip Prosthetic joint infection- We will do Some blood test today and if they are normal will stop Doxy and se ehow you do.follow up 6 months or earlier as needed

## 2023-04-27 NOTE — Progress Notes (Signed)
 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 Has been on Antibiotics since sept 2023 The patient, with a history of right hip joint infection, gout, and hypertension, presents for a follow-up visit. She is doing very well- no pain in rt hip- healed well She is on Doxy for the rt hip PJI with actinomyces - She says she did not get any cold since she has been on doxy- previously she used to het cold and uri in the winter  BP is high this morning but she has not taken her meds yet   Following taken from previous note ALLECIA Hill is a 83 y.o. female with a history of  DM, HTN, Gout, CKD rt hip replacement in 2018 was recently in the hospital with rt hip pain, actinomyces PJI  She had Rt hip replacement in 2018 She always had pain after the surgery 5 years ago, but in June 2023 noted a dark tender spot on the surgical scar- Her PCP gave her levaquin X 10 days on 06/20/21 -wbc was normal  - She says the levaquin improved the pain some what but only to recur She saw Dr.Menz , ortho on 07/07/21. He sent ESR/CRP  ESR was 60 and CRP N  On 08/07/21 she saw surgeon and he did not think there was any thigh abscess She had MRI on 08/20/21 and it showed Well-defined periprosthetic fluid collection along the lateral margin of the right femoral neck extends through the anterior compartment musculature of the proximal right thigh along its the expected site of prior surgical incision. Collection extends into the overlying subcutaneous soft tissues at the anterolateral aspect of the thigh. Although difficult to measure given the elongated, slightly serpiginous course of the collection, the collection measures approximately 11 cm in length. There is no obvious connection to the overlying skin surfac Pt returned to see Dr.Menz on 8/21/with worsening pain and rt hip was aspirated and showed 16K  wbc with 90% N- crystals neg- no culture sent She was taken for surgery Dr.Menz's noteread  :" Prior incision was opened at the bottom of the incision distally there is approximately 2 cm area of necrotic fat which was cultured.  Going deep to this the deep fascia was incised and the tensor muscle retracted posteriorly and the joint capsule exposed with again tissue cultures obtained when opening the capsule fluid was also cultured from the joint fluid but it did not appear very inflammatory.  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 ( initially on day of discharge only the superficial cultures were positive and later the joint capsule and synovial fluid were positive as well)  She was discharged home on IV ceftrixaone She had elevated LFTS thought to be due to ceftriaxone and it was dc on 09/26/21 and she had a few days off meds and a repeat test in 4 days showed normalization of the LFTS- she went on ertapenem ( with a couple of days of Po doxy) She had swelling of rt arm and  Doppler US showed DVT of rt arm and she was hospitalized 10/21/21-10/24/21 and underwent thrombectomy- She was discharged on eliquis- She completed 6 weeks of IV on 10/24/11 and was tested for PO augmentin which she tolerated well and sent home on that for 4 months- on 12/01/21 she was c/o itching and augmentin was switched to Doxy and she has been on it since then with no complaints    Past Medical History:  Diagnosis Date   Anemia    Anemia, unspecified    Hgb 11.8 - 04/2013   Arthritis    Cataract    Bilateral   CHF (congestive heart failure) (HCC)    Chronic kidney disease    COPD  (chronic obstructive pulmonary disease) (HCC)    DDD (degenerative disc disease), lumbar    Degenerative arthritis    Diabetes mellitus type 2, uncomplicated (HCC)    AODM (A1C 6.9%) 04/2013   Diabetes mellitus without complication (HCC)    Dyspnea    with exertion   Esophagitis    Gout    Uric acid 5.4 - 08/2012   Hypertension    Obesity, unspecified    Sarcoidosis, lung (HCC)    reported by pt, Clinically without a biopsy   Sleep apnea    OSA--Use C-PAP    Past Surgical History:  Procedure Laterality Date   ABDOMINAL HYSTERECTOMY     age 89   ANTERIOR HIP REVISION Right 09/04/2021   Procedure: ANTERIOR HIP REVISION;  Surgeon: Kennedy Bucker, MD;  Location: [facility] ORS;  Service: Orthopedics;  Laterality: Right;   APPLICATION OF WOUND VAC Right 09/04/2021   Procedure: APPLICATION OF WOUND VAC;  Surgeon: Kennedy Bucker, MD;  Location: [facility] OR[facility];  Service: Orthopedics;  Laterality: Right;   CATARACT EXTRACTION Bilateral    COLONOSCOPY  11/02/2005   Hyperplastic Polyp: CBF 10/2015; Recall Ltr mailed 08/27/2015 (dw)   FOOT SURGERY Right    HALLUX VALGUS REPAIR Right    OOPHORECTOMY     PERIPHERAL VASCULAR THROMBECTOMY Right 10/23/2021   Procedure: PERIPHERAL VASCULAR THROMBECTOMY;  Surgeon: Renford Dills, MD;  Location: [facility];  Service: Cardiovascular;  Laterality: Right;   TOTAL HIP ARTHROPLASTY 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   Procedure: TOTAL HIP ARTHROPLASTY ANTERIOR APPROACH;  Surgeon: Kennedy Bucker, MD;  Location: [facility] OR[facility];  Service: Orthopedics;  Laterality: Right;    Social History   Socioeconomic History   Marital status: Divorced    Spouse name: Not on file   Number of children: 1   Years of education: 2 years college   Highest education level: Not on file  Occupational History   Occupation: retired    Comment: worked at [employer]  Tobacco Use   Smoking status: Former    Current packs/day: 0.00    Average packs/day: 0.7 packs/day for 4.0 years (2.8 ttl pk-yrs)     Types: Cigarettes    Start date: 01/12/1962    Quit date: 01/12/1966    Years since quitting: 57.3   Smokeless tobacco: Never  Vaping Use   Vaping status: Never Used  Substance and Sexual Activity   Alcohol use: No    Alcohol/week: 0.0 standard drinks of alcohol   Drug use: No   Sexual activity: Not on file  Other Topics Concern   Not on file  Social History Narrative   Not on file   Social Drivers of Health   Financial Resource Strain: High Risk (02/11/2023)   Received from [facility]   Overall Financial Resource  Strain (CARDIA)    Difficulty of Paying Living Expenses: Very hard  Food Insecurity: Food Insecurity Present (02/11/2023)   Received from [facility]   Hunger Vital Sign    Worried About Running Out of Food in the Last Year: Sometimes true    Ran Out of Food in the Last Year: Sometimes true  Transportation Needs: No Transportation Needs (02/11/2023)   Received from [facility] - Transportation    In the past 12 months, has lack of transportation kept you from medical appointments or from getting medications?: No    Lack of Transportation (Non-Medical): No  Physical Activity: Not on file  Stress: Not on file  Social Connections: Not on file  Intimate Partner Violence: Not At Risk (10/22/2021)   Humiliation, Afraid, Rape, and Kick questionnaire    Fear of Current or Ex-Partner: No    Emotionally Abused: No    Physically Abused: No    Sexually Abused: No    Family History  Problem Relation Age of Onset   Diabetes Mother        ovarian cancer   Hypertension Mother    Ovarian cancer Mother    Diabetes Father        hardening of arteries   Heart disease Father    Hypertension Father    Diabetes Sister    Heart disease Sister    Diabetes Brother    Heart disease Brother    Allergies  Allergen Reactions   Ceftriaxone     Abnormal LFTS    Hydralazine Itching   Meloxicam Other (See Comments)     Causes excessive sleepiness fainting   Pioglitazone Itching   Tramadol Other (See Comments)    Causes excessive sleepiness Dizzy/fainting   I? Current Outpatient Medications  Medication Sig Dispense Refill   albuterol (PROVENTIL HFA;VENTOLIN HFA) 108 (90 Base) MCG/ACT inhaler Inhale 2 puffs into the lungs every 6 (six) hours as needed for wheezing or shortness of breath. 1 Inhaler 0   allopurinol (ZYLOPRIM) 100 MG tablet Take by mouth.     amLODipine (NORVASC) 5 MG tablet Take 5 mg by mouth daily.      apixaban (ELIQUIS) 2.5 MG TABS tablet Take 1 tablet (2.5 mg total) by mouth 2 (two) times daily. 60 tablet 6   budesonide-formoterol (SYMBICORT) 160-4.5 MCG/ACT inhaler Inhale 2 puffs into the lungs 2 (two) times daily.      Calcium Carbonate-Vitamin D3 (CALCIUM 600/VITAMIN D) 600-400 MG-UNIT TABS Take 2 tablets by mouth daily.     cetirizine (ZYRTEC) 10 MG tablet Take 10 mg by mouth at bedtime.      diclofenac sodium (VOLTAREN) 1 % GEL Apply topically 2 (two) times daily as needed (pain).     docusate sodium (COLACE) 100 MG capsule Take 1 capsule (100 mg total) by mouth 2 (two) times daily. 10 capsule 0   doxycycline (VIBRA-TABS) 100 MG tablet TAKE 1 TABLET TWICE DAILY 60 tablet 0   fexofenadine (ALLEGRA) 180 MG tablet Take 180 mg by mouth daily.      isosorbide mononitrate (IMDUR) 60 MG 24 hr tablet Take 180 mg by mouth daily.      magnesium hydroxide (MILK OF MAGNESIA) 400 MG/5ML suspension Take 30 mLs by mouth every 4 (four) hours as needed. If constipation/ no BM for 2 days     mometasone (NASONEX) 50 MCG/ACT nasal spray Place 2 sprays into the nose daily as needed (allergies).      montelukast (SINGULAIR)  10 MG tablet Take 10 mg by mouth every evening.      omeprazole (PRILOSEC) 20 MG capsule      oxybutynin (DITROPAN) 5 MG tablet      sitaGLIPtin (JANUVIA) 50 MG tablet Take 50 mg by mouth daily.     spironolactone (ALDACTONE) 25 MG tablet Take 25 mg by mouth daily.      tiotropium  (SPIRIVA) 18 MCG inhalation capsule Place 18 mcg into inhaler and inhale daily.     vitamin B-12 (CYANOCOBALAMIN) 1000 MCG tablet Take 1,000 mcg by mouth daily.     vitamin C (ASCORBIC ACID) 500 MG tablet Take 500 mg by mouth daily.      ferrous sulfate 325 (65 FE) MG tablet Take 325 mg by mouth 2 (two) times daily with a meal.     No current facility-administered medications for this visit.     Abtx:  Anti-infectives (From admission, onward)    None       REVIEW OF SYSTEMS:  Const: negative fever, negative chills, negative weight loss Eyes: negative diplopia or visual changes, negative eye pain ENT: negative coryza, negative sore throat Resp: negative cough, hemoptysis, dyspnea Cards: negative for chest pain, palpitations, lower extremity edema GU: negative for frequency, dysuria and hematuria GI: Negative for abdominal pain, diarrhea, bleeding, constipation Skin:  N Heme: negative for easy bruising and gum/nose bleeding MS: rt hip pain resolved Neurolo:negative for headaches, dizziness, vertigo, memory problems  Psych: negative for feelings of anxiety, depression  Endocrine: negative for thyroid, diabetes Allergy/Immunology-PCN rash  Objective:  VITALS:  BP (!) 165/75   Pulse 62   Temp (!) 97.3 F (36.3 C) (Temporal)   Ht 5\' 3"  (1.6 m)   Wt 176 lb (79.8 kg)   LMP 05/28/1969 (Approximate) Comment: hysterectomy  BMI 31.18 kg/m   PHYSICAL EXAM:  General: Alert, cooperative, no distress, appears stated age.  Head: Normocephalic, without obvious abnormality, atraumatic. Eyes: Conjunctivae clear, anicteric sclerae. Pupils are equal ENT Nares normal. No drainage or sinus tenderness. Lips, mucosa, and tongue normal. No Thrush Neck: Supple, symmetrical, no adenopathy, thyroid: non tender no carotid bruit and no JVD. Back: No CVA tenderness. Lungs: Clear to auscultation bilaterally. No Wheezing or Rhonchi. No rales. Heart: Regular rate and rhythm, no murmur, rub or  gallop. Abdomen: Soft, non-tender,not distended. Bowel sounds normal. No masses Extremities:Normal  Rthip surgical site healed well Ambullates well Lymph: Cervical, supraclavicular normal. Neurologic: Grossly non-focal Pertinent Labs Lab Results  09/23/21 ESR 56 ( 100 on 09/16/21) CRP 10 ( range 0-10) 12/18/21 ESR 81 CRP 1.7    Microbiology: Joint capsule Actinomyces  Superficial layer - actinomyces Middle layer soft tissue actinomyces  ? 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 LFTS normalized Started on Amoxicillin, but because of pruritus switched to Doxy Nov 2023. She continues to take Doxy and no side effects- month 18 Last esr 34  ( was 100 in sept 2023) and crp 0.5 from 11/2022   Discussed with her about discontinuing doxy. She is  hesistant to stop because she is doing well and also not having any colds Use SPF 60 to prevent photosensitive reaction  H/o rt arm DVT related to PICC in Oct 2023 S/p mechanical thrombectomy and was  on eliquis- until  11/2022. Discontinued by [facility] her cardiologist    DM on januvia   HTN on amlodipine   CKD  Discussed the management with patient? Labs today- ESR/CRP If they are normal she will stop Doxy and we will observe off it Follow up 6 months or earlier  ESR 49- continue taking Doxy

## 2023-04-28 ENCOUNTER — Telehealth: Payer: Self-pay

## 2023-04-28 NOTE — Telephone Encounter (Signed)
-----   Message from Alica Inks sent at 04/27/2023  6:24 PM EDT ----- Please ask her to continue taking Doxycycline as ESR 49. thx ----- Message ----- From: Dannis Dy, Lab In [place] Sent: 04/27/2023   9:45 AM EDT To: Alica Inks, MD

## 2023-04-28 NOTE — Telephone Encounter (Signed)
 Patient informed of results and to continue doxy. Patient verbalized understanding.  Cherell Colvin Adel Holt, CMA

## 2023-05-06 ENCOUNTER — Other Ambulatory Visit: Payer: Self-pay | Admitting: Infectious Diseases

## 2023-05-24 ENCOUNTER — Ambulatory Visit (INDEPENDENT_AMBULATORY_CARE_PROVIDER_SITE_OTHER): Payer: Medicare PPO | Admitting: Nurse Practitioner

## 2023-05-24 ENCOUNTER — Encounter (INDEPENDENT_AMBULATORY_CARE_PROVIDER_SITE_OTHER): Payer: Self-pay | Admitting: Nurse Practitioner

## 2023-05-24 VITALS — BP 179/84 | HR 59 | Resp 16 | Ht 63.0 in | Wt 173.0 lb

## 2023-05-24 DIAGNOSIS — I82A11 Acute embolism and thrombosis of right axillary vein: Secondary | ICD-10-CM

## 2023-05-24 DIAGNOSIS — I10 Essential (primary) hypertension: Secondary | ICD-10-CM

## 2023-05-25 ENCOUNTER — Encounter (INDEPENDENT_AMBULATORY_CARE_PROVIDER_SITE_OTHER): Payer: Self-pay | Admitting: Nurse Practitioner

## 2023-05-25 NOTE — Progress Notes (Signed)
 Subjective:    Patient ID: Mackenzie Hill, female    DOB: [birth year], 83 y.o.   MRN: [id number] Chief Complaint  Patient presents with   Follow-up    28yr follow up    Procedure 10/23/2021: 1.          Mechanical thrombectomy of the right brachial vein, axillary vein and subclavian vein 2.    Percutaneous transluminal angioplasty of the right subclavian vein to 8 mm.   She presented to [facility] 10/22/2021 for right upper extremity arm swelling.  She had an infected prosthesis and was managed with IV antibiotics via PICC line.  She is followed by infectious disease who noted that her right upper extremity was markedly swollen and non invasive studies revealed RUE DVT involving the subclavian, axillary and brachial veins.  Since her thrombectomy nearly 2 years ago the patient has not had any significant issues with swelling.  She was on Eliquis  and this was stopped in November by her cardiologist Dr. Beau Bound.  She denies any postphlebitic symptoms or swelling issues.     Review of Systems  All other systems reviewed and are negative.      Objective:    Physical Exam Vitals reviewed.  HENT:     Head: Normocephalic.  Cardiovascular:     Rate and Rhythm: Normal rate.     Pulses: Normal pulses.  Pulmonary:     Effort: Pulmonary effort is normal.  Skin:    General: Skin is warm and dry.  Neurological:     Mental Status: She is alert and oriented to person, place, and time.  Psychiatric:        Mood and Affect: Mood normal.        Behavior: Behavior normal.        Thought Content: Thought content normal.        Judgment: Judgment normal.     BP (!) 179/84   Pulse (!) 59   Resp 16   Ht 5\' 3"  (1.6 m)   Wt 173 lb (78.5 kg)   LMP 05/28/1969 (Approximate) Comment: hysterectomy  BMI 30.65 kg/m   Past Medical History:  Diagnosis Date   Anemia    Anemia, unspecified    Hgb 11.8 - 04/2013   Arthritis    Cataract    Bilateral   CHF (congestive heart failure) (HCC)     Chronic kidney disease    COPD (chronic obstructive pulmonary disease) (HCC)    DDD (degenerative disc disease), lumbar    Degenerative arthritis    Diabetes mellitus type 2, uncomplicated (HCC)    AODM (A1C 6.9%) 04/2013   Diabetes mellitus without complication (HCC)    Dyspnea    with exertion   Esophagitis    Gout    Uric acid 5.4 - 08/2012   Hypertension    Obesity, unspecified    Sarcoidosis, lung (HCC)    reported by pt, Clinically without a biopsy   Sleep apnea    OSA--Use C-PAP    Social History   Socioeconomic History   Marital status: Divorced    Spouse name: Not on file   Number of children: 1   Years of education: 2 years college   Highest education level: Not on file  Occupational History   Occupation: retired    Comment: worked at [employer]  Tobacco Use   Smoking status: Former    Current packs/day: 0.00    Average packs/day: 0.7 packs/day for 4.0 years (2.8 ttl pk-yrs)  Types: Cigarettes    Start date: 01/12/1962    Quit date: 01/12/1966    Years since quitting: 57.4   Smokeless tobacco: Never  Vaping Use   Vaping status: Never Used  Substance and Sexual Activity   Alcohol use: No    Alcohol/week: 0.0 standard drinks of alcohol   Drug use: No   Sexual activity: Not on file  Other Topics Concern   Not on file  Social History Narrative   Not on file   Social Drivers of Health   Financial Resource Strain: High Risk (02/11/2023)   Received from [facility]   Overall Financial Resource Strain (CARDIA)    Difficulty of Paying Living Expenses: Very hard  Food Insecurity: Food Insecurity Present (02/11/2023)   Received from [facility]   Hunger Vital Sign    Worried About Running Out of Food in the Last Year: Sometimes true    Ran Out of Food in the Last Year: Sometimes true  Transportation Needs: No Transportation Needs (02/11/2023)   Received from [facility] Transportation    In the  past 12 months, has lack of transportation kept you from medical appointments or from getting medications?: No    Lack of Transportation (Non-Medical): No  Physical Activity: Not on file  Stress: Not on file  Social Connections: Not on file  Intimate Partner Violence: Not At Risk (10/22/2021)   Humiliation, Afraid, Rape, and Kick questionnaire    Fear of Current or Ex-Partner: No    Emotionally Abused: No    Physically Abused: No    Sexually Abused: No    Past Surgical History:  Procedure Laterality Date   ABDOMINAL HYSTERECTOMY     age 59   ANTERIOR HIP REVISION Right 09/04/2021   Procedure: ANTERIOR HIP REVISION;  Surgeon: Molli Angelucci, MD;  Location: [facility] OR[facility];  Service: Orthopedics;  Laterality: Right;   APPLICATION OF WOUND VAC Right 09/04/2021   Procedure: APPLICATION OF WOUND VAC;  Surgeon: Molli Angelucci, MD;  Location: [facility] OR[facility];  Service: Orthopedics;  Laterality: Right;   CATARACT EXTRACTION Bilateral    COLONOSCOPY  11/02/2005   Hyperplastic Polyp: CBF 10/2015; Recall Ltr mailed 08/27/2015 (dw)   FOOT SURGERY Right    HALLUX VALGUS REPAIR Right    OOPHORECTOMY     PERIPHERAL VASCULAR THROMBECTOMY Right 10/23/2021   Procedure: PERIPHERAL VASCULAR THROMBECTOMY;  Surgeon: Jackquelyn Mass, MD;  Location: [facility];  Service: Cardiovascular;  Laterality: Right;   TOTAL HIP ARTHROPLASTY 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   Procedure: TOTAL HIP ARTHROPLASTY ANTERIOR APPROACH;  Surgeon: Molli Angelucci, MD;  Location: [facility] OR[facility];  Service: Orthopedics;  Laterality: Right;    Family History  Problem Relation Age of Onset   Diabetes Mother        ovarian cancer   Hypertension Mother    Ovarian cancer Mother    Diabetes Father        hardening of arteries   Heart disease Father    Hypertension Father    Diabetes Sister    Heart disease Sister    Diabetes Brother    Heart disease Brother     Allergies  Allergen Reactions   Ceftriaxone      Abnormal LFTS    Hydralazine Itching    Meloxicam Other (See Comments)    Causes excessive sleepiness fainting   Pioglitazone Itching   Tramadol Other (See Comments)    Causes excessive sleepiness Dizzy/fainting  Latest Ref Rng & Units 11/30/2022   10:10 AM 06/23/2022    9:39 AM 03/19/2022   10:36 AM  CBC  WBC 4.0 - 10.5 K/uL 5.3  6.3  6.2   Hemoglobin 12.0 - 15.0 g/dL 82.9  56.2  13.0   Hematocrit 36.0 - 46.0 % 33.7  37.0  32.1   Platelets 150 - 400 K/uL 261  283  252        CMP     Component Value Date/Time   NA 139 11/30/2022 1010   NA 138 01/08/2014 1109   K 4.0 11/30/2022 1010   K 3.8 01/08/2014 1109   CL 109 11/30/2022 1010   CL 103 01/08/2014 1109   CO2 21 (L) 11/30/2022 1010   CO2 27 01/08/2014 1109   GLUCOSE 127 (H) 11/30/2022 1010   GLUCOSE 304 (H) 01/08/2014 1109   BUN 25 (H) 11/30/2022 1010   BUN 29 (H) 01/08/2014 1109   CREATININE 1.89 (H) 11/30/2022 1010   CREATININE 1.66 (H) 01/08/2014 1109   CALCIUM  9.3 11/30/2022 1010   CALCIUM  8.3 (L) 01/08/2014 1109   PROT 7.5 11/30/2022 1010   PROT 6.3 (L) 01/08/2014 1109   ALBUMIN 3.9 11/30/2022 1010   ALBUMIN 3.0 (L) 01/08/2014 1109   AST 27 11/30/2022 1010   AST 17 01/08/2014 1109   ALT 23 11/30/2022 1010   ALT 54 01/08/2014 1109   ALKPHOS 99 11/30/2022 1010   ALKPHOS 130 (H) 01/08/2014 1109   BILITOT 0.7 11/30/2022 1010   BILITOT 0.5 01/08/2014 1109   GFRNONAA 26 (L) 11/30/2022 1010   GFRNONAA 32 (L) 01/08/2014 1109     No results found.     Assessment & Plan:   1. Acute deep vein thrombosis (DVT) of axillary vein of right upper extremity (HCC) (Primary) The patient continues to remain without any significant swelling or recurrence of symptoms in her right upper extremity.  She has been off of Eliquis  for at least 6 months now.  Currently because the patient does not have any symptoms or issues she will follow-up with us  on an as-needed basis.  2. Essential (primary) hypertension Continue antihypertensive medications as  already ordered, these medications have been reviewed and there are no changes at this time.   Current Outpatient Medications on File Prior to Visit  Medication Sig Dispense Refill   albuterol  (PROVENTIL  HFA;VENTOLIN  HFA) 108 (90 Base) MCG/ACT inhaler Inhale 2 puffs into the lungs every 6 (six) hours as needed for wheezing or shortness of breath. 1 Inhaler 0   allopurinol  (ZYLOPRIM ) 100 MG tablet Take by mouth.     amLODipine  (NORVASC ) 5 MG tablet Take 5 mg by mouth daily.      apixaban  (ELIQUIS ) 2.5 MG TABS tablet Take 1 tablet (2.5 mg total) by mouth 2 (two) times daily. 60 tablet 6   budesonide-formoterol  (SYMBICORT) 160-4.5 MCG/ACT inhaler Inhale 2 puffs into the lungs 2 (two) times daily.      Calcium  Carbonate-Vitamin D3 (CALCIUM  600/VITAMIN D) 600-400 MG-UNIT TABS Take 2 tablets by mouth daily.     cetirizine (ZYRTEC) 10 MG tablet Take 10 mg by mouth at bedtime.      diclofenac  sodium (VOLTAREN ) 1 % GEL Apply topically 2 (two) times daily as needed (pain).     docusate sodium  (COLACE) 100 MG capsule Take 1 capsule (100 mg total) by mouth 2 (two) times daily. 10 capsule 0   doxycycline  (VIBRA -TABS) 100 MG tablet TAKE 1 TABLET TWICE DAILY 60 tablet 11   fexofenadine (  ALLEGRA) 180 MG tablet Take 180 mg by mouth daily.      isosorbide  mononitrate (IMDUR ) 60 MG 24 hr tablet Take 180 mg by mouth daily.      JARDIANCE 25 MG TABS tablet Take 25 mg by mouth daily.     magnesium  hydroxide (MILK OF MAGNESIA) 400 MG/5ML suspension Take 30 mLs by mouth every 4 (four) hours as needed. If constipation/ no BM for 2 days     mometasone  (NASONEX ) 50 MCG/ACT nasal spray Place 2 sprays into the nose daily as needed (allergies).      montelukast  (SINGULAIR ) 10 MG tablet Take 10 mg by mouth every evening.      omeprazole (PRILOSEC) 20 MG capsule      oxybutynin  (DITROPAN ) 5 MG tablet      sitaGLIPtin (JANUVIA) 50 MG tablet Take 50 mg by mouth daily.     spironolactone  (ALDACTONE ) 25 MG tablet Take 25 mg by  mouth daily.      tiotropium (SPIRIVA ) 18 MCG inhalation capsule Place 18 mcg into inhaler and inhale daily.     vitamin B-12 (CYANOCOBALAMIN ) 1000 MCG tablet Take 1,000 mcg by mouth daily.     vitamin C  (ASCORBIC ACID ) 500 MG tablet Take 500 mg by mouth daily.      ferrous sulfate 325 (65 FE) MG tablet Take 325 mg by mouth 2 (two) times daily with a meal.     No current facility-administered medications on file prior to visit.    There are no Patient Instructions on file for this visit. No follow-ups on file.   Aarvi Stotts E Britian Jentz, NP

## 2023-10-11 ENCOUNTER — Telehealth: Payer: Self-pay

## 2023-10-11 NOTE — Telephone Encounter (Signed)
 Patient left voicemail in triage wanting to confirm next appointment. Triage staff called patient and made aware of upcoming appointment

## 2023-10-28 ENCOUNTER — Encounter: Payer: Self-pay | Admitting: Infectious Diseases

## 2023-10-28 ENCOUNTER — Ambulatory Visit: Attending: Infectious Diseases | Admitting: Infectious Diseases

## 2023-10-28 VITALS — BP 166/79 | HR 61 | Temp 97.4°F | Wt 176.0 lb

## 2023-10-28 DIAGNOSIS — Z86718 Personal history of other venous thrombosis and embolism: Secondary | ICD-10-CM | POA: Diagnosis not present

## 2023-10-28 DIAGNOSIS — Z792 Long term (current) use of antibiotics: Secondary | ICD-10-CM | POA: Insufficient documentation

## 2023-10-28 DIAGNOSIS — Z5941 Food insecurity: Secondary | ICD-10-CM | POA: Diagnosis not present

## 2023-10-28 DIAGNOSIS — T8451XD Infection and inflammatory reaction due to internal right hip prosthesis, subsequent encounter: Secondary | ICD-10-CM

## 2023-10-28 DIAGNOSIS — Z87891 Personal history of nicotine dependence: Secondary | ICD-10-CM | POA: Diagnosis not present

## 2023-10-28 DIAGNOSIS — Z96641 Presence of right artificial hip joint: Secondary | ICD-10-CM | POA: Diagnosis not present

## 2023-10-28 DIAGNOSIS — Z23 Encounter for immunization: Secondary | ICD-10-CM | POA: Diagnosis not present

## 2023-10-28 DIAGNOSIS — N189 Chronic kidney disease, unspecified: Secondary | ICD-10-CM | POA: Diagnosis not present

## 2023-10-28 DIAGNOSIS — I130 Hypertensive heart and chronic kidney disease with heart failure and stage 1 through stage 4 chronic kidney disease, or unspecified chronic kidney disease: Secondary | ICD-10-CM | POA: Diagnosis not present

## 2023-10-28 DIAGNOSIS — Z79899 Other long term (current) drug therapy: Secondary | ICD-10-CM | POA: Insufficient documentation

## 2023-10-28 DIAGNOSIS — E1122 Type 2 diabetes mellitus with diabetic chronic kidney disease: Secondary | ICD-10-CM | POA: Insufficient documentation

## 2023-10-28 DIAGNOSIS — Z59868 Other specified financial insecurity: Secondary | ICD-10-CM | POA: Insufficient documentation

## 2023-10-28 DIAGNOSIS — Z7984 Long term (current) use of oral hypoglycemic drugs: Secondary | ICD-10-CM | POA: Diagnosis not present

## 2023-10-28 DIAGNOSIS — Z833 Family history of diabetes mellitus: Secondary | ICD-10-CM | POA: Diagnosis not present

## 2023-10-28 DIAGNOSIS — A4289 Other forms of actinomycosis: Secondary | ICD-10-CM

## 2023-10-28 DIAGNOSIS — Z8249 Family history of ischemic heart disease and other diseases of the circulatory system: Secondary | ICD-10-CM | POA: Insufficient documentation

## 2023-10-28 DIAGNOSIS — I509 Heart failure, unspecified: Secondary | ICD-10-CM | POA: Diagnosis not present

## 2023-10-28 NOTE — Progress Notes (Signed)
 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 Has been on Antibiotics since sept 2023 The patient, with a history of right hip joint infection, gout, and hypertension, presents for a follow-up visit. She is doing very well- no pain in rt hip- healed well But she has poor appetite, poor sleep and seeing her PCP for that and hetried her on remeron but she couldn't take it- He has I think prescribed periactin  She is on Doxy for the rt hip PJI with actinomyces - She says she has not had a cold since  since she has been on doxy- previously she used to het cold and uri in the winter  BP is high this morning but she has not taken her meds yet   Following taken from previous note Mackenzie Hill is a 83 y.o. female with a history of  DM, HTN, Gout, CKD rt hip replacement in 2018 was recently in the hospital with rt hip pain, actinomyces PJI  She had Rt hip replacement in 2018 She always had pain after the surgery 5 years ago, but in June 2023 noted a dark tender spot on the surgical scar- Her PCP gave her levaquin X 10 days on 06/20/21 -wbc was normal  - She says the levaquin improved the pain some what but only to recur She saw Dr.Menz , ortho on 07/07/21. He sent ESR/CRP  ESR was 60 and CRP N  On 08/07/21 she saw surgeon and he did not think there was any thigh abscess She had MRI on 08/20/21 and it showed Well-defined periprosthetic fluid collection along the lateral margin of the right femoral neck extends through the anterior compartment musculature of the proximal right thigh along its the expected site of prior surgical incision. Collection extends into the overlying subcutaneous soft tissues at the anterolateral aspect of the thigh. Although difficult to measure given the elongated, slightly serpiginous course of the collection, the collection measures approximately 11 cm in  length. There is no obvious connection to the overlying skin surfac Pt returned to see Dr.Menz on 8/21/with worsening pain and rt hip was aspirated and showed 16K wbc with 90% N- crystals neg- no culture sent She was taken for surgery Dr.Menz's noteread  : Prior incision was opened at the bottom of the incision distally there is approximately 2 cm area of necrotic fat which was cultured.  Going deep to this the deep fascia was incised and the tensor muscle retracted posteriorly and the joint capsule exposed with again tissue cultures obtained when opening the capsule fluid was also cultured from the joint fluid but it did not appear very inflammatory.  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 which blocked the view of the hip and this was excised with use of osteotome   The cup was exposed and the head dislocated with removal of the bipolar head with some difficulty secondary to scar tissue Multiple tissue culture and fluid culture sent  4 cultures taken were all positive for actinomyces neuii ( initially on day of discharge only the superficial cultures were positive and later the joint capsule and synovial fluid were positive as well)  She was discharged home on IV ceftrixaone She had elevated LFTS thought to be due to ceftriaxone  and it was dc on 09/26/21 and she had a few days off meds  and a repeat test in 4 days showed normalization of the LFTS- she went on ertapenem  ( with a couple of days of Po doxy) She had swelling of rt arm and  Doppler US  showed DVT of rt arm and she was hospitalized 10/21/21-10/24/21 and underwent thrombectomy- She was discharged on eliquis - She completed 6 weeks of IV on 10/24/11 and was tested for PO augmentin which she tolerated well and sent home on that for 4 months- on 12/01/21 she was c/o itching and augmentin was switched to Doxy and she has been on it since then with no complaints    Past Medical History:  Diagnosis Date    Anemia    Anemia, unspecified    Hgb 11.8 - 04/2013   Arthritis    Cataract    Bilateral   CHF (congestive heart failure) (HCC)    Chronic kidney disease    COPD (chronic obstructive pulmonary disease) (HCC)    DDD (degenerative disc disease), lumbar    Degenerative arthritis    Diabetes mellitus type 2, uncomplicated (HCC)    AODM (A1C 6.9%) 04/2013   Diabetes mellitus without complication (HCC)    Dyspnea    with exertion   Esophagitis    Gout    Uric acid 5.4 - 08/2012   Hypertension    Obesity, unspecified    Sarcoidosis, lung    reported by pt, Clinically without a biopsy   Sleep apnea    OSA--Use C-PAP    Past Surgical History:  Procedure Laterality Date   ABDOMINAL HYSTERECTOMY     age 40   ANTERIOR HIP REVISION Right 09/04/2021   Procedure: ANTERIOR HIP REVISION;  Surgeon: Kathlynn Sharper, MD;  Location: [facility];  Service: Orthopedics;  Laterality: Right;   APPLICATION OF WOUND VAC Right 09/04/2021   Procedure: APPLICATION OF WOUND VAC;  Surgeon: Kathlynn Sharper, MD;  Location: [facility];  Service: Orthopedics;  Laterality: Right;   CATARACT EXTRACTION Bilateral    COLONOSCOPY  11/02/2005   Hyperplastic Polyp: CBF 10/2015; Recall Ltr mailed 08/27/2015 (dw)   FOOT SURGERY Right    HALLUX VALGUS REPAIR Right    OOPHORECTOMY     PERIPHERAL VASCULAR THROMBECTOMY Right 10/23/2021   Procedure: PERIPHERAL VASCULAR THROMBECTOMY;  Surgeon: Jama Cordella MATSU, MD;  Location: [facility];  Service: Cardiovascular;  Laterality: Right;   TOTAL HIP ARTHROPLASTY 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   Procedure: TOTAL HIP ARTHROPLASTY ANTERIOR APPROACH;  Surgeon: Kathlynn Sharper, MD;  Location: [facility];  Service: Orthopedics;  Laterality: Right;    Social History   Socioeconomic History   Marital status: Divorced    Spouse name: Not on file   Number of children: 1   Years of education: 2 years college   Highest education level: Not on file  Occupational History   Occupation: retired     Comment: worked at [employer]  Tobacco Use   Smoking status: Former    Current packs/day: 0.00    Average packs/day: 0.7 packs/day for 4.0 years (2.8 ttl pk-yrs)    Types: Cigarettes    Start date: 01/12/1962    Quit date: 01/12/1966    Years since quitting: 57.8   Smokeless tobacco: Never  Vaping Use   Vaping status: Never Used  Substance and Sexual Activity   Alcohol use: No    Alcohol/week: 0.0 standard drinks of alcohol   Drug use: No   Sexual activity: Not on file  Other Topics Concern   Not on file  Social History Narrative  Not on file   Social Drivers of Health   Financial Resource Strain: High Risk (10/05/2023)   Received [facility]   Overall Financial Resource Strain (CARDIA)    Difficulty of Paying Living Expenses: Very hard  Food Insecurity: Food Insecurity Present (10/05/2023)   [facility] from [facility]   Hunger Vital Sign    Within the past 12 months, you worried that your food would run out before you got the money to buy more.: Sometimes true    Within the past 12 months, the food you bought just didn't last and you didn't have money to get more.: Often true  Transportation Needs: No Transportation Needs (10/05/2023)   Received from [facility]    In the past 12 months, has lack of transportation kept you from medical appointments or from getting medications?: No    Lack of Transportation (Non-Medical): No  Physical Activity: Not on file  Stress: Not on file  Social Connections: Not on file  Intimate Partner Violence: Not At Risk (10/22/2021)   Humiliation, Afraid, Rape, and Kick questionnaire    Fear of Current or Ex-Partner: No    Emotionally Abused: No    Physically Abused: No    Sexually Abused: No    Family History  Problem Relation Age of Onset   Diabetes Mother        ovarian cancer   Hypertension Mother    Ovarian cancer Mother    Diabetes Father         hardening of arteries   Heart disease Father    Hypertension Father    Diabetes Sister    Heart disease Sister    Diabetes Brother    Heart disease Brother    Allergies  Allergen Reactions   Ceftriaxone      Abnormal LFTS    Hydralazine Itching   Meloxicam Other (See Comments)    Causes excessive sleepiness fainting   Pioglitazone Itching   Tramadol Other (See Comments)    Causes excessive sleepiness Dizzy/fainting   I? Current Outpatient Medications  Medication Sig Dispense Refill   albuterol  (PROVENTIL  HFA;VENTOLIN  HFA) 108 (90 Base) MCG/ACT inhaler Inhale 2 puffs into the lungs every 6 (six) hours as needed for wheezing or shortness of breath. 1 Inhaler 0   allopurinol  (ZYLOPRIM ) 100 MG tablet Take by mouth.     amLODipine  (NORVASC ) 5 MG tablet Take 5 mg by mouth daily.      apixaban  (ELIQUIS ) 2.5 MG TABS tablet Take 1 tablet (2.5 mg total) by mouth 2 (two) times daily. 60 tablet 6   budesonide-formoterol  (SYMBICORT) 160-4.5 MCG/ACT inhaler Inhale 2 puffs into the lungs 2 (two) times daily.      Calcium  Carbonate-Vitamin D3 (CALCIUM  600/VITAMIN D) 600-400 MG-UNIT TABS Take 2 tablets by mouth daily.     cetirizine (ZYRTEC) 10 MG tablet Take 10 mg by mouth at bedtime.      diclofenac  sodium (VOLTAREN ) 1 % GEL Apply topically 2 (two) times daily as needed (pain).     docusate sodium  (COLACE) 100 MG capsule Take 1 capsule (100 mg total) by mouth 2 (two) times daily. 10 capsule 0   doxycycline  (VIBRA -TABS) 100 MG tablet TAKE 1 TABLET TWICE DAILY 60 tablet 11   ferrous sulfate 325 (65 FE) MG tablet Take 325 mg by mouth 2 (two) times daily with a meal.     fexofenadine (ALLEGRA) 180 MG tablet Take 180 mg by mouth daily.  isosorbide  mononitrate (IMDUR ) 60 MG 24 hr tablet Take 180 mg by mouth daily.      JARDIANCE 25 MG TABS tablet Take 25 mg by mouth daily.     magnesium  hydroxide (MILK OF MAGNESIA) 400 MG/5ML suspension Take 30 mLs by mouth every 4 (four) hours as needed. If  constipation/ no BM for 2 days     mometasone  (NASONEX ) 50 MCG/ACT nasal spray Place 2 sprays into the nose daily as needed (allergies).      montelukast  (SINGULAIR ) 10 MG tablet Take 10 mg by mouth every evening.      omeprazole (PRILOSEC) 20 MG capsule      oxybutynin  (DITROPAN ) 5 MG tablet      sitaGLIPtin (JANUVIA) 50 MG tablet Take 50 mg by mouth daily.     spironolactone  (ALDACTONE ) 25 MG tablet Take 25 mg by mouth daily.      tiotropium (SPIRIVA ) 18 MCG inhalation capsule Place 18 mcg into inhaler and inhale daily.     vitamin B-12 (CYANOCOBALAMIN ) 1000 MCG tablet Take 1,000 mcg by mouth daily.     vitamin C  (ASCORBIC ACID ) 500 MG tablet Take 500 mg by mouth daily.      No current facility-administered medications for this visit.     Abtx:  Anti-infectives (From admission, onward)    None       REVIEW OF SYSTEMS:  Const: negative fever, negative chills, negative weight loss Eyes: negative diplopia or visual changes, negative eye pain ENT: negative coryza, negative sore throat Resp: negative cough, hemoptysis, dyspnea Cards: negative for chest pain, palpitations, lower extremity edema GU: negative for frequency, dysuria and hematuria GI: Negative for abdominal pain, diarrhea, bleeding, constipation Skin:  N Heme: negative for easy bruising and gum/nose bleeding MS: rt hip pain resolved Neurolo:negative for headaches, dizziness, vertigo, memory problems  Psych: negative for feelings of anxiety, depression  Endocrine: negative for thyroid , diabetes Allergy/Immunology-PCN rash  Objective:  VITALS:  LMP 05/28/1969 (Approximate) Comment: hysterectomy  PHYSICAL EXAM:  General: Alert, cooperative, no distress, appears stated age.  Head: Normocephalic, without obvious abnormality, atraumatic. Eyes: Conjunctivae clear, anicteric sclerae. Pupils are equal ENT Nares normal. No drainage or sinus tenderness. Lips, mucosa, and tongue normal. No Thrush Neck: Supple, symmetrical,  no adenopathy, thyroid : non tender no carotid bruit and no JVD. Back: No CVA tenderness. Lungs: Clear to auscultation bilaterally. No Wheezing or Rhonchi. No rales. Heart: Regular rate and rhythm, no murmur, rub or gallop. Abdomen: Soft, non-tender,not distended. Bowel sounds normal. No masses Extremities:Normal  Rthip surgical site healed well Ambullates well Lymph: Cervical, supraclavicular normal. Neurologic: Grossly non-focal Pertinent Labs Lab Results  09/23/21 ESR 56 ( 100 on 09/16/21) CRP 10 ( range 0-10) 12/18/21 ESR 81 CRP 1.7    Microbiology: Joint capsule Actinomyces  Superficial layer - actinomyces Middle layer soft tissue actinomyces  ? Impression/Recommendation Rt total hip replacement in July 2018  Actinomyces infection-of PJI right hip  on 09/04/21 underwent irrigation and debridement of rt hip, excision of heterotopic calcification  Completed 6 weeks of IV antibiotic on 10/23/21- Ceftriaxone  ws changed to ertapenem  because of Abnoral LFTS  9/18 . LFTS normalized Started on Amoxicillin , but because of pruritus switched to Doxy Nov 2023. She continues to take Doxy and no side effects- month 18 Last esr 49 from April 2025 ( was 100 in sept 2023) and crp <0.5    Poor appetite, lack of energy poor sleep- could be due to other cause. ? Doxy related Pt  Discussed with her about  discontinuing doxy.  Especially with poor appetite- she is also wanting to  to stop Doxy and see . She will observe whether the pain recurs and let me know an start Doxy if that happens  Will need dental prophylaxis    H/o rt arm DVT related to PICC in Oct 2023 S/p mechanical thrombectomy and was  on eliquis - until 11/2022. Discontinued by [facility] her cardiologist    DM on januvia   HTN on amlodipine , spirinolactone   CKD stable  Discussed the management with patient? stop Doxy and we will observe off it Follow up 1 month or earlier

## 2023-10-28 NOTE — Patient Instructions (Signed)
 You have been on antibiotic for the past 2 years for a rt hip PJI with actinomycetes- this has resolved completely- you have been on doxyccyline- now you have poor appetite no energy which could be due to other cause, but doxy may do that as well- so we can stopthe doxy and se ehow you are doing  Watch for any pain in the rt hip See whether your appetite and energy improves Call me if the pain starts in your hip again and you would need to go back on Doxy Today you got the flu vaccine Will follow in 1 month

## 2023-11-22 NOTE — Progress Notes (Signed)
 Mackenzie Hill is a  83 y.o. female who presents for  CHIEF COMPLAINT Chief Complaint  Patient presents with  . Annual Exam    Subjective: History of Present Illness  Pt in NAD. Here for yearly eval. Obese with BMI>30. HTN stable on meds. Has DM not on insulin  and gout on Allopurinol . Also with CKD. Weight stable. MMG scheduled. Sleeping OK. Feels OK. No fever or HA's. Denies Cp or SOB. Some DOE. No palpitations. No LE edema. No change in bowels or bladder.    Past Medical History:  Diagnosis Date  . Anemia    Hgb 11.8- 04/2013  . CHF (congestive heart failure) (CMS/HHS-HCC)   . Chronic kidney disease, unspecified CKD stage   . COPD (chronic obstructive pulmonary disease) (CMS/HHS-HCC)   . Degenerative arthritis   . Diabetes mellitus type 2, uncomplicated (CMS/HHS-HCC)    AODM (A1C 6.9%)- 04/2013  . Esophagitis   . Gout    Uric acid 5.4- 08/2012  . Hypertension   . Obesity   . Sarcoidosis    Clinically without a biopsy  . Sleep apnea    With CPAP   Patient Active Problem List  Diagnosis  . HTN (hypertension)  . AODM (A1c 7.4% - 06/2013)  . Hypertension  . Gout  . Esophagitis  . Obesity  . Degenerative arthritis  . Diabetes mellitus type 2, uncomplicated (CMS/HHS-HCC)  . Anemia  . DOE (dyspnea on exertion)  . Diastolic dysfunction  . OSA on CPAP  . Type 2 diabetes with nephropathy (HCC)  . CKD (chronic kidney disease), stage III (CMS-HCC)  . Arthralgia of multiple sites  . Great toe pain, left  . Chronic gouty arthropathy without tophi  . Colon cancer screening  . Osteoarthritis of multiple joints  . Encounter for long-term (current) use of high-risk medication  . Primary localized osteoarthritis of right hip  . Infection of prosthetic total hip joint, initial encounter  . GERD without esophagitis  . Acute deep vein thrombosis (DVT) of right upper extremity (CMS/HHS-HCC)    Past Surgical History:  Procedure Laterality Date  . COLONOSCOPY  11/02/2005    Hyperplastic Polyp: CBF 10/2015; Recall Ltr mailed 08/27/2015 (dw)  . COLOGUARD  12/23/2015   Negative: CBF 12/2018  . Total hip arhtroplsty anterior approach  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   Dr.Menz  . UNLISTED PROCEDURE PELVIS/HIP JOINT Right 2023   had to be flushed due to an injection and had a PICC line placed; got a blood clot from this as well  . EYE SURGERY Bilateral    cataract surgery- July and August 2021  . Hallux valgus repair Left   . HYSTERECTOMY    . OOPHORECTOMY       Current Outpatient Medications:  .  albuterol  (VENTOLIN  HFA) 90 mcg/actuation inhaler, Inhale 2 inhalations into the lungs every 4 (four) hours as needed for Wheezing., Disp: 1 Inhaler, Rfl: 5 .  allopurinoL  (ZYLOPRIM ) 100 MG tablet, Take 2 tablets (200 mg total) by mouth once daily, Disp: 180 tablet, Rfl: 1 .  amLODIPine  (NORVASC ) 5 MG tablet, TAKE 1 TABLET TWICE DAILY, Disp: 180 tablet, Rfl: 3 .  ascorbic acid , vitamin C , (VITAMIN C ) 500 MG tablet, Take 1 tablet (500 mg total) by mouth once daily, Disp: 90 tablet, Rfl: 3 .  calcium  carbonate-vitamin D3 (CALTRATE 600+D) 600 mg(1,500mg ) -400 unit tablet, Take 2 tablets by mouth once daily., Disp: , Rfl:  .  cyanocobalamin  (VITAMIN B12) 1000 MCG tablet, Take 1,000 mcg by mouth once daily., Disp: ,  Rfl:  .  diclofenac  (VOLTAREN ) 1 % topical gel, Apply 2 g topically 4 (four) times daily., Disp: 100 g, Rfl: 3 .  ferrous sulfate 325 (65 FE) MG EC tablet, Take 1 tablet (325 mg total) by mouth 2 (two) times daily with meals, Disp: 180 tablet, Rfl: 3 .  isosorbide  mononitrate (IMDUR ) 60 MG ER tablet, TAKE 3 TABLETS ONE TIME DAILY, Disp: 270 tablet, Rfl: 3 .  JANUVIA 50 mg tablet, TAKE 1 TABLET (50 MG TOTAL) BY MOUTH ONCE DAILY, Disp: 90 tablet, Rfl: 3 .  levocetirizine (XYZAL ) 5 MG tablet, TAKE 1 TABLET BY MOUTH EVERY DAY IN THE EVENING, Disp: 90 tablet, Rfl: 3 .  mometasone  (NASONEX ) 50 mcg/actuation nasal spray, by Nasal route, Disp: , Rfl:  .  montelukast  (SINGULAIR ) 10 mg  tablet, TAKE 1 TABLET ONE TIME DAILY, Disp: 90 tablet, Rfl: 3 .  omeprazole (PRILOSEC) 20 MG DR capsule, TAKE 1 CAPSULE ONE TIME DAILY, Disp: 90 capsule, Rfl: 3 .  oxyBUTYnin  (DITROPAN ) 5 mg tablet, TAKE 1 TABLET TWICE DAILY, Disp: 180 tablet, Rfl: 3 .  spironolactone  (ALDACTONE ) 25 MG tablet, TAKE 1 TABLET ONE TIME DAILY, Disp: 90 tablet, Rfl: 3 .  SYMBICORT 160-4.5 mcg/actuation inhaler, INHALE 2 PUFFS TWICE DAILY, Disp: 30.6 g, Rfl: 1 .  TRUE METRIX GLUCOSE TEST STRIP test strip, TEST BLOOD SUGAR EVERY DAY, Disp: 100 strip, Rfl: 5 .  TRUEPLUS LANCETS, TEST BLOOD SUGAR EVERY DAY (FASTING) AND AS NEEDED IF SYMPTOMATIC, Disp: 200 each, Rfl: 5 .  alcohol swabs (BD ALCOHOL SWABS) PadM, Apply 1 Swab topically 4 (four) times daily before meals and nightly., Disp: 400 each, Rfl: 3 .  blood glucose meter kit, by XX route as directed, Disp: 1 each, Rfl: 0  Actos [pioglitazone], Ceftriaxone , Hydralazine, Meloxicam, Penicillins, Tramadol, and Empagliflozin  Social History   Socioeconomic History  . Marital status: Widowed  Occupational History  . Occupation: Retired  Tobacco Use  . Smoking status: Former    Current packs/day: 0.00    Average packs/day: 0.7 packs/day for 3.0 years (2.1 ttl pk-yrs)    Types: Cigarettes    Start date: 01/13/1963    Quit date: 01/12/1966    Years since quitting: 57.8    Passive exposure: Past  . Smokeless tobacco: Never  Vaping Use  . Vaping status: Never Used  Substance and Sexual Activity  . Alcohol use: No  . Drug use: No  . Sexual activity: Never  Social History Narrative   Marital Status- Widowed   Lives by herself   Employment- Retired   Exercise hx- Occasional- does exercises at home   Religious Affiliation- [employer]   Social Drivers of Health   Financial Resource Strain: High Risk (11/22/2023)   Overall Financial Resource Strain (CARDIA)   . Difficulty of Paying Living Expenses: Very hard  Food Insecurity: Food Insecurity Present (11/22/2023)    Hunger Vital Sign   . Worried About Programme Researcher, Broadcasting/film/video in the Last Year: Never true   . Ran Out of Food in the Last Year: Often true  Transportation Needs: No Transportation Needs (11/22/2023)   PRAPARE - Transportation   . Lack of Transportation (Medical): No   . Lack of Transportation (Non-Medical): No  Housing Stability: Low Risk  (11/22/2023)   Housing Stability Vital Sign   . Unable to Pay for Housing in the Last Year: No   . Number of Times Moved in the Last Year: 0   . Homeless in the Last Year: No  Family History  Problem Relation Name Age of Onset  . High blood pressure (Hypertension) Mother    . Diabetes Mother    . Ovarian cancer Mother    . High blood pressure (Hypertension) Father    . Diabetes Father    . Heart disease Father    . Heart disease Sister    . Diabetes Sister    . No Known Problems Sister    . No Known Problems Sister    . No Known Problems Sister    . Heart disease Brother    . Diabetes Brother    . No Known Problems Brother    . No Known Problems Brother    . No Known Problems Brother    . No Known Problems Brother      A comprehensive ROS was negative except for HPI  PE: BP 122/70   Pulse 66   Ht 160 cm (5' 2.99)   Wt 78.9 kg (174 lb)   SpO2 98%   BMI 30.83 kg/m  General: Alert oriented x3  Eyes: Sclera and conjunctiva clear; pupils equal round and reactive to light and accommodation; extraocular movements intact Ears: External ears and canal normal; tympanic membranes normal.   Nose: Mucosa healthy without drainage or ulceration Oropharynx: No suspicious lesions Neck: No swelling, masses, stiffness, pain, limited movement, carotid pulses normal bilaterally, thyroid  normal size, no masses palpated. No bruits heard. Lungs: Respirations unlabored; clear to auscultation bilaterally Back: No spinal deformity Cardiovascular: Heart regular rate and rhythm with 2/6 systolic murmur noted. No gallops, or rubs Abdomen: Soft; non tender;  non distended;  no masses or organomegaly Lymph Nodes: No significant cervical, supraclavicular, or axillary lymphadenopathy noted Musculoskeletal: No active joint inflammation Extremities: Normal, no edema Pulses: Dorsalis pedis palpable and symmetric bilaterally Neurologic: Alert and oriented; speech intact; face symmetrical; moves all extremities well    Goals     . * Patient Goals (pt-stated)      Eat better        Office Visit on 09/22/2023  Component Date Value Ref Range Status  . WBC (White Blood Cell Count) 09/22/2023 5.6  4.1 - 10.2 10^3/uL Final  . RBC (Red Blood Cell Count) 09/22/2023 3.35 (L)  4.04 - 5.48 10^6/uL Final  . Hemoglobin 09/22/2023 10.7 (L)  12.0 - 15.0 gm/dL Final  . Hematocrit 90/89/7974 32.0 (L)  35.0 - 47.0 % Final  . MCV (Mean Corpuscular Volume) 09/22/2023 95.5  80.0 - 100.0 fl Final  . MCH (Mean Corpuscular Hemoglobin) 09/22/2023 31.9 (H)  27.0 - 31.2 pg Final  . MCHC (Mean Corpuscular Hemoglobin * 09/22/2023 33.4  32.0 - 36.0 gm/dL Final  . Platelet Count 09/22/2023 245  150 - 450 10^3/uL Final  . RDW-CV (Red Cell Distribution Widt* 09/22/2023 14.1  11.6 - 14.8 % Final  . MPV (Mean Platelet Volume) 09/22/2023 10.3  9.4 - 12.4 fl Final  . Neutrophils 09/22/2023 3.95  1.50 - 7.80 10^3/uL Final  . Lymphocytes 09/22/2023 1.06  1.00 - 3.60 10^3/uL Final  . Monocytes 09/22/2023 0.47  0.00 - 1.50 10^3/uL Final  . Eosinophils 09/22/2023 0.05  0.00 - 0.55 10^3/uL Final  . Basophils 09/22/2023 0.04  0.00 - 0.09 10^3/uL Final  . Neutrophil % 09/22/2023 70.2 (H)  32.0 - 70.0 % Final  . Lymphocyte % 09/22/2023 18.9  10.0 - 50.0 % Final  . Monocyte % 09/22/2023 8.4  4.0 - 13.0 % Final  . Eosinophil % 09/22/2023 0.9 (L)  1.0 - 5.0 %  Final  . Basophil% 09/22/2023 0.7  0.0 - 2.0 % Final  . Immature Granulocyte % 09/22/2023 0.9 (H)  <=0.7 % Final  . Immature Granulocyte Count 09/22/2023 0.05  <=0.06 10^3/L Final  . Glucose 09/22/2023 106  70 - 110 mg/dL Final  .  Sodium 90/89/7974 139  136 - 145 mmol/L Final  . Potassium 09/22/2023 4.9  3.6 - 5.1 mmol/L Final  . Chloride 09/22/2023 109  97 - 109 mmol/L Final  . Carbon Dioxide (CO2) 09/22/2023 24.1  22.0 - 32.0 mmol/L Final  . Urea Nitrogen (BUN) 09/22/2023 32 (H)  7 - 25 mg/dL Final  . Creatinine 90/89/7974 2.0 (H)  0.6 - 1.1 mg/dL Final  . Glomerular Filtration Rate (eGFR) 09/22/2023 24 (L)  >60 mL/min/1.73sq m Final  . Calcium  09/22/2023 9.7  8.7 - 10.3 mg/dL Final  . AST  90/89/7974 15  8 - 39 U/L Final  . ALT  09/22/2023 18  5 - 38 U/L Final  . Alk Phos (alkaline Phosphatase) 09/22/2023 114 (H)  34 - 104 U/L Final  . Albumin 09/22/2023 4.0  3.5 - 4.8 g/dL Final  . Bilirubin, Total 09/22/2023 0.5  0.3 - 1.2 mg/dL Final  . Protein, Total 09/22/2023 6.6  6.1 - 7.9 g/dL Final  . A/G Ratio 90/89/7974 1.5  1.0 - 5.0 gm/dL Final  . Thyroid  Stimulating Hormone (TSH) 09/22/2023 1.333  0.450-5.330 uIU/ml uIU/mL Final  . Color 09/22/2023 Colorless  Colorless, Straw, Light Yellow, Yellow, Dark Yellow Final  . Clarity 09/22/2023 Clear  Clear Final  . Specific Gravity 09/22/2023 1.013  1.005 - 1.030 Final  . pH, Urine 09/22/2023 5.5  5.0 - 8.0 Final  . Protein, Urinalysis 09/22/2023 Trace (!)  Negative mg/dL Final  . Glucose, Urinalysis 09/22/2023 Negative  Negative mg/dL Final  . Ketones, Urinalysis 09/22/2023 Negative  Negative mg/dL Final  . Blood, Urinalysis 09/22/2023 Negative  Negative Final  . Nitrite, Urinalysis 09/22/2023 Negative  Negative Final  . Leukocyte Esterase, Urinalysis 09/22/2023 2+ (!)  Negative Final  . Bilirubin, Urinalysis 09/22/2023 Negative  Negative Final  . Urobilinogen, Urinalysis 09/22/2023 0.2  0.2 - 1.0 mg/dL Final  . WBC, UA 90/89/7974 7 (H)  <=5 /hpf Final  . Red Blood Cells, Urinalysis 09/22/2023 <1  <=3 /hpf Final  . Bacteria, Urinalysis 09/22/2023 0-5  0 - 5 /hpf Final  . Squamous Epithelial Cells, Urinaly* 09/22/2023 0  /hpf Final  . Vitamin B12 09/22/2023 1,394   >300 pg/mL Final  . C Reactive Protein - [facility] 09/22/2023 <1  0 - 10 mg/L Final  . Sed Rate - [facility] 09/22/2023 24  0 - 40 mm/hr Final  . Urine Culture, Routine - [facility] 09/23/2023 Final report   Final  . Result 1 - [facility] 09/23/2023 Comment   Final  Ancillary Procedure on 07/08/2023  Component Date Value Ref Range Status  . Urine Culture, Routine - [facility] 07/08/2023 Final report (!)   Final  . Result 1 - [facility] 07/08/2023 Comment (!)   Final  . Result 2 - [facility] 07/08/2023 Comment   Final  . Antimicrobial Susceptibility - Lab* 07/08/2023 Comment   Final  Office Visit on 07/08/2023  Component Date Value Ref Range Status  . WBC (White Blood Cell Count) 07/08/2023 5.8  4.1 - 10.2 10^3/uL Final  . RBC (Red Blood Cell Count) 07/08/2023 3.63 (L)  4.04 - 5.48 10^6/uL Final  . Hemoglobin 07/08/2023 11.5 (L)  12.0 - 15.0 gm/dL Final  . Hematocrit 93/73/7974 34.4 (L)  35.0 - 47.0 % Final  . MCV (Mean Corpuscular Volume) 07/08/2023 94.8  80.0 - 100.0 fl Final  . MCH (Mean Corpuscular Hemoglobin) 07/08/2023 31.7 (H)  27.0 - 31.2 pg Final  . MCHC (Mean Corpuscular Hemoglobin * 07/08/2023 33.4  32.0 - 36.0 gm/dL Final  . Platelet Count 07/08/2023 279  150 - 450 10^3/uL Final  . RDW-CV (Red Cell Distribution Widt* 07/08/2023 13.2  11.6 - 14.8 % Final  . MPV (Mean Platelet Volume) 07/08/2023 10.7  9.4 - 12.4 fl Final  . Neutrophils 07/08/2023 3.81  1.50 - 7.80 10^3/uL Final  . Lymphocytes 07/08/2023 1.43  1.00 - 3.60 10^3/uL Final  . Monocytes 07/08/2023 0.43  0.00 - 1.50 10^3/uL Final  . Eosinophils 07/08/2023 0.10  0.00 - 0.55 10^3/uL Final  . Basophils 07/08/2023 0.04  0.00 - 0.09 10^3/uL Final  . Neutrophil % 07/08/2023 65.2  32.0 - 70.0 % Final  . Lymphocyte % 07/08/2023 24.5  10.0 - 50.0 % Final  . Monocyte % 07/08/2023 7.4  4.0 - 13.0 % Final  . Eosinophil % 07/08/2023 1.7  1.0 - 5.0 % Final  . Basophil% 07/08/2023 0.7  0.0 - 2.0 % Final  . Immature Granulocyte % 07/08/2023 0.5   <=0.7 % Final  . Immature Granulocyte Count 07/08/2023 0.03  <=0.06 10^3/L Final  . Glucose 07/08/2023 105  70 - 110 mg/dL Final  . Sodium 93/73/7974 138  136 - 145 mmol/L Final  . Potassium 07/08/2023 4.5  3.6 - 5.1 mmol/L Final  . Chloride 07/08/2023 107  97 - 109 mmol/L Final  . Carbon Dioxide (CO2) 07/08/2023 23.8  22.0 - 32.0 mmol/L Final  . Urea Nitrogen (BUN) 07/08/2023 36 (H)  7 - 25 mg/dL Final  . Creatinine 93/73/7974 2.1 (H)  0.6 - 1.1 mg/dL Final  . Glomerular Filtration Rate (eGFR) 07/08/2023 23 (L)  >60 mL/min/1.73sq m Final  . Calcium  07/08/2023 10.3  8.7 - 10.3 mg/dL Final  . AST  93/73/7974 19  8 - 39 U/L Final  . ALT  07/08/2023 17  5 - 38 U/L Final  . Alk Phos (alkaline Phosphatase) 07/08/2023 123 (H)  34 - 104 U/L Final  . Albumin 07/08/2023 4.1  3.5 - 4.8 g/dL Final  . Bilirubin, Total 07/08/2023 0.5  0.3 - 1.2 mg/dL Final  . Protein, Total 07/08/2023 7.0  6.1 - 7.9 g/dL Final  . A/G Ratio 93/73/7974 1.4  1.0 - 5.0 gm/dL Final  . Uric Acid 93/73/7974 2.8  2.3 - 6.6 mg/dL Final  . Color 93/73/7974 Colorless  Colorless, Straw, Light Yellow, Yellow, Dark Yellow Final  . Clarity 07/08/2023 Clear  Clear Final  . Specific Gravity 07/08/2023 1.008  1.005 - 1.030 Final  . pH, Urine 07/08/2023 5.0  5.0 - 8.0 Final  . Protein, Urinalysis 07/08/2023 Negative  Negative mg/dL Final  . Glucose, Urinalysis 07/08/2023 Negative  Negative mg/dL Final  . Ketones, Urinalysis 07/08/2023 Negative  Negative mg/dL Final  . Blood, Urinalysis 07/08/2023 Negative  Negative Final  . Nitrite, Urinalysis 07/08/2023 Negative  Negative Final  . Leukocyte Esterase, Urinalysis 07/08/2023 3+ (!)  Negative Final  . Bilirubin, Urinalysis 07/08/2023 Negative  Negative Final  . Urobilinogen, Urinalysis 07/08/2023 0.2  0.2 - 1.0 mg/dL Final  . WBC, UA 93/73/7974 29 (H)  <=5 /hpf Final  . Red Blood Cells, Urinalysis 07/08/2023 5 (H)  <=3 /hpf Final  . Bacteria, Urinalysis 07/08/2023 0-5  0 - 5 /hpf  Final  . Squamous Epithelial Cells, [place]*  07/08/2023 0  /hpf Final  . Hemoglobin A1C 07/08/2023 6.9 (H)  4.2 - 5.6 % Final  . Average Blood Glucose (Calc) 07/08/2023 151  mg/dL Final  Office Visit on 03/09/2023  Component Date Value Ref Range Status  . WBC (White Blood Cell Count) 03/09/2023 5.5  4.1 - 10.2 10^3/uL Final  . RBC (Red Blood Cell Count) 03/09/2023 3.65 (L)  4.04 - 5.48 10^6/uL Final  . Hemoglobin 03/09/2023 11.7 (L)  12.0 - 15.0 gm/dL Final  . Hematocrit 97/74/7974 35.2  35.0 - 47.0 % Final  . MCV (Mean Corpuscular Volume) 03/09/2023 96.4  80.0 - 100.0 fl Final  . MCH (Mean Corpuscular Hemoglobin) 03/09/2023 32.1 (H)  27.0 - 31.2 pg Final  . MCHC (Mean Corpuscular Hemoglobin * 03/09/2023 33.2  32.0 - 36.0 gm/dL Final  . Platelet Count 03/09/2023 244  150 - 450 10^3/uL Final  . RDW-CV (Red Cell Distribution Widt* 03/09/2023 13.6  11.6 - 14.8 % Final  . MPV (Mean Platelet Volume) 03/09/2023 10.3  9.4 - 12.4 fl Final  . Neutrophils 03/09/2023 3.54  1.50 - 7.80 10^3/uL Final  . Lymphocytes 03/09/2023 1.36  1.00 - 3.60 10^3/uL Final  . Monocytes 03/09/2023 0.44  0.00 - 1.50 10^3/uL Final  . Eosinophils 03/09/2023 0.09  0.00 - 0.55 10^3/uL Final  . Basophils 03/09/2023 0.04  0.00 - 0.09 10^3/uL Final  . Neutrophil % 03/09/2023 64.5  32.0 - 70.0 % Final  . Lymphocyte % 03/09/2023 24.7  10.0 - 50.0 % Final  . Monocyte % 03/09/2023 8.0  4.0 - 13.0 % Final  . Eosinophil % 03/09/2023 1.6  1.0 - 5.0 % Final  . Basophil% 03/09/2023 0.7  0.0 - 2.0 % Final  . Immature Granulocyte % 03/09/2023 0.5  <=0.7 % Final  . Immature Granulocyte Count 03/09/2023 0.03  <=0.06 10^3/L Final  . Glucose 03/09/2023 132 (H)  70 - 110 mg/dL Final  . Sodium 97/74/7974 139  136 - 145 mmol/L Final  . Potassium 03/09/2023 4.4  3.6 - 5.1 mmol/L Final  . Chloride 03/09/2023 107  97 - 109 mmol/L Final  . Carbon Dioxide (CO2) 03/09/2023 23.1  22.0 - 32.0 mmol/L Final  . Urea Nitrogen (BUN) 03/09/2023 37 (H)   7 - 25 mg/dL Final  . Creatinine 97/74/7974 2.0 (H)  0.6 - 1.1 mg/dL Final  . Glomerular Filtration Rate (eGFR) 03/09/2023 24 (L)  >60 mL/min/1.73sq m Final  . Calcium  03/09/2023 10.0  8.7 - 10.3 mg/dL Final  . AST  97/74/7974 19  8 - 39 U/L Final  . ALT  03/09/2023 19  5 - 38 U/L Final  . Alk Phos (alkaline Phosphatase) 03/09/2023 116 (H)  34 - 104 U/L Final  . Albumin 03/09/2023 4.1  3.5 - 4.8 g/dL Final  . Bilirubin, Total 03/09/2023 0.6  0.3 - 1.2 mg/dL Final  . Protein, Total 03/09/2023 6.7  6.1 - 7.9 g/dL Final  . A/G Ratio 97/74/7974 1.6  1.0 - 5.0 gm/dL Final  . Uric Acid 97/74/7974 3.2  2.3 - 6.6 mg/dL Final  . Color 97/74/7974 Colorless  Colorless, Straw, Light Yellow, Yellow, Dark Yellow Final  . Clarity 03/09/2023 Clear  Clear Final  . Specific Gravity 03/09/2023 1.005  1.005 - 1.030 Final  . pH, Urine 03/09/2023 5.0  5.0 - 8.0 Final  . Protein, Urinalysis 03/09/2023 Negative  Negative mg/dL Final  . Glucose, Urinalysis 03/09/2023 Negative  Negative mg/dL Final  . Ketones, Urinalysis 03/09/2023 Negative  Negative mg/dL Final  .  Blood, Urinalysis 03/09/2023 Negative  Negative Final  . Nitrite, Urinalysis 03/09/2023 Negative  Negative Final  . Leukocyte Esterase, Urinalysis 03/09/2023 Trace (!)  Negative Final  . Bilirubin, Urinalysis 03/09/2023 Negative  Negative Final  . Urobilinogen, Urinalysis 03/09/2023 0.2  0.2 - 1.0 mg/dL Final  . WBC, UA 97/74/7974 3  <=5 /hpf Final  . Red Blood Cells, Urinalysis 03/09/2023 0  <=3 /hpf Final  . Bacteria, Urinalysis 03/09/2023 0-5  0 - 5 /hpf Final  . Squamous Epithelial Cells, Urinaly* 03/09/2023 0  /hpf Final  . Hemoglobin A1C 03/09/2023 7.0 (H)  4.2 - 5.6 % Final  . Average Blood Glucose (Calc) 03/09/2023 154  mg/dL Final   DIAGNOSIS: Primary hypertension  (primary encounter diagnosis)  Depression screening (Z13.31)  Type 2 diabetes with nephropathy (HCC)  Class 1 obesity with serious comorbidity in adult, unspecified BMI,  unspecified obesity type  Stage 3 chronic kidney disease, unspecified whether stage 3a or 3b CKD (CMS-HCC)  Chronic gouty arthropathy without tophi   PLAN: No change in care. Labs today. F/u with Cardiology. MMG as scheduled. RTC 3 mo, sooner if needed     Attestation Statement:   I personally performed the service. (TP)  Reyes JONETTA Costa, MD, MD  *Some images could not be shown.

## 2023-11-30 ENCOUNTER — Ambulatory Visit: Attending: Infectious Diseases | Admitting: Infectious Diseases

## 2023-11-30 ENCOUNTER — Encounter: Payer: Self-pay | Admitting: Infectious Diseases

## 2023-11-30 VITALS — BP 162/73 | HR 67 | Temp 97.3°F | Ht 63.0 in | Wt 176.0 lb

## 2023-11-30 DIAGNOSIS — N189 Chronic kidney disease, unspecified: Secondary | ICD-10-CM | POA: Insufficient documentation

## 2023-11-30 DIAGNOSIS — Z96641 Presence of right artificial hip joint: Secondary | ICD-10-CM | POA: Diagnosis not present

## 2023-11-30 DIAGNOSIS — M25551 Pain in right hip: Secondary | ICD-10-CM | POA: Insufficient documentation

## 2023-11-30 DIAGNOSIS — Z86718 Personal history of other venous thrombosis and embolism: Secondary | ICD-10-CM | POA: Diagnosis not present

## 2023-11-30 DIAGNOSIS — T8450XD Infection and inflammatory reaction due to unspecified internal joint prosthesis, subsequent encounter: Secondary | ICD-10-CM

## 2023-11-30 DIAGNOSIS — T8451XD Infection and inflammatory reaction due to internal right hip prosthesis, subsequent encounter: Secondary | ICD-10-CM | POA: Diagnosis not present

## 2023-11-30 DIAGNOSIS — I509 Heart failure, unspecified: Secondary | ICD-10-CM | POA: Diagnosis present

## 2023-11-30 DIAGNOSIS — M109 Gout, unspecified: Secondary | ICD-10-CM | POA: Diagnosis not present

## 2023-11-30 DIAGNOSIS — Z87440 Personal history of urinary (tract) infections: Secondary | ICD-10-CM | POA: Insufficient documentation

## 2023-11-30 DIAGNOSIS — Y848 Other medical procedures as the cause of abnormal reaction of the patient, or of later complication, without mention of misadventure at the time of the procedure: Secondary | ICD-10-CM | POA: Insufficient documentation

## 2023-11-30 DIAGNOSIS — E1122 Type 2 diabetes mellitus with diabetic chronic kidney disease: Secondary | ICD-10-CM | POA: Diagnosis not present

## 2023-11-30 DIAGNOSIS — L905 Scar conditions and fibrosis of skin: Secondary | ICD-10-CM | POA: Diagnosis not present

## 2023-11-30 DIAGNOSIS — I130 Hypertensive heart and chronic kidney disease with heart failure and stage 1 through stage 4 chronic kidney disease, or unspecified chronic kidney disease: Secondary | ICD-10-CM | POA: Diagnosis not present

## 2023-11-30 DIAGNOSIS — A4289 Other forms of actinomycosis: Secondary | ICD-10-CM

## 2023-11-30 NOTE — Patient Instructions (Signed)
 You are here for follow up of rt hip infection- you have completed 1 year of antibiotic and we stopped it a month ago- you are doing well- I will follow up in 6 months. Continue to follow up with your PCP

## 2023-11-30 NOTE — Progress Notes (Signed)
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 After a year of antibiuotic we stoipped doxy a month ago and she is here for follow up and doing well- no hip pain-  Her appetite has also improved She had gone to her PCP for an anuual visit and they checked her routine urine and diagnosed her with UTI  pseudomonas and put her on cipro- she had some dysuria for 2 weeks she states and it has cleared-    Following taken from previous note Mackenzie Hill is a 83 y.o. female with a history of  DM, HTN, Gout, CKD rt hip replacement in 2018 was recently in the hospital with rt hip pain, actinomyces PJI  She had Rt hip replacement in 2018 She always had pain after the surgery 5 years ago, but in June 2023 noted a dark tender spot on the surgical scar- Her PCP gave her levaquin X 10 days on 06/20/21 -wbc was normal  - She says the levaquin improved the pain some what but only to recur She saw Dr.Menz , ortho on 07/07/21. He sent ESR/CRP  ESR was 60 and CRP N  On 08/07/21 she saw surgeon and he did not think there was any thigh abscess She had MRI on 08/20/21 and it showed Well-defined periprosthetic fluid collection along the lateral margin of the right femoral neck extends through the anterior compartment musculature of the proximal right thigh along its the expected site of prior surgical incision. Collection extends into the overlying subcutaneous soft tissues at the anterolateral aspect of the thigh. Although difficult to measure given the elongated, slightly serpiginous course of the collection, the collection measures approximately 11 cm in length. There is no obvious connection to the overlying skin surfac Pt returned to see Dr.Menz on 8/21/with worsening pain and rt hip was aspirated and showed 16K wbc with 90% N- crystals neg- no culture sent She was taken for surgery Dr.Menz's  noteread  : Prior incision was opened at the bottom of the incision distally there is approximately 2 cm area of necrotic fat which was cultured.  Going deep to this the deep fascia was incised and the tensor muscle retracted posteriorly and the joint capsule exposed with again tissue cultures obtained when opening the capsule fluid was also cultured from the joint fluid but it did not appear very inflammatory.  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 which blocked the view of the hip and this was excised with use of osteotome   The cup was exposed and the head dislocated with removal of the bipolar head with some difficulty secondary to scar tissue Multiple tissue culture and fluid culture sent  4 cultures taken were all positive for actinomyces neuii ( initially on day of discharge only the superficial cultures were positive and later the joint capsule and synovial fluid were positive as well)  She was discharged home on IV ceftrixaone She had elevated LFTS thought to be due to ceftriaxone  and it was dc on 09/26/21 and she had a few days off meds and a repeat test in 4 days showed normalization of the LFTS- she went on ertapenem  ( with a couple of days of Po doxy) She had swelling of rt arm and  Doppler US  showed DVT of rt arm and she was hospitalized 10/21/21-10/24/21 and underwent thrombectomy-  She was discharged on eliquis - She completed 6 weeks of IV on 10/24/11 and was tested for PO augmentin which she tolerated well and sent home on that for 4 months- on 12/01/21 she was c/o itching and augmentin was switched to Doxy and she has been on it since then with no complaints unti oct 2025 when it was stopped . She was having lose of appetite, low energy  and since she had taken enough antibiotics we decided to stop and observe    Past Medical History:  Diagnosis Date   Anemia    Anemia, unspecified    Hgb 11.8 - 04/2013   Arthritis    Cataract    Bilateral    CHF (congestive heart failure) (HCC)    Chronic kidney disease    COPD (chronic obstructive pulmonary disease) (HCC)    DDD (degenerative disc disease), lumbar    Degenerative arthritis    Diabetes mellitus type 2, uncomplicated (HCC)    AODM (A1C 6.9%) 04/2013   Diabetes mellitus without complication (HCC)    Dyspnea    with exertion   Esophagitis    Gout    Uric acid 5.4 - 08/2012   Hypertension    Obesity, unspecified    Sarcoidosis, lung    reported by pt, Clinically without a biopsy   Sleep apnea    OSA--Use C-PAP    Past Surgical History:  Procedure Laterality Date   ABDOMINAL HYSTERECTOMY     age 41   ANTERIOR HIP REVISION Right 09/04/2021   Procedure: ANTERIOR HIP REVISION;  Surgeon: Kathlynn Sharper, MD;  Location: [facility];  Service: Orthopedics;  Laterality: Right;   APPLICATION OF WOUND VAC Right 09/04/2021   Procedure: APPLICATION OF WOUND VAC;  Surgeon: Kathlynn Sharper, MD;  Location: [facility];  Service: Orthopedics;  Laterality: Right;   CATARACT EXTRACTION Bilateral    COLONOSCOPY  11/02/2005   Hyperplastic Polyp: CBF 10/2015; Recall Ltr mailed 08/27/2015 (dw)   FOOT SURGERY Right    HALLUX VALGUS REPAIR Right    OOPHORECTOMY     PERIPHERAL VASCULAR THROMBECTOMY Right 10/23/2021   Procedure: PERIPHERAL VASCULAR THROMBECTOMY;  Surgeon: Jama Cordella MATSU, MD;  Location: [facility];  Service: Cardiovascular;  Laterality: Right;   TOTAL HIP ARTHROPLASTY 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   Procedure: TOTAL HIP ARTHROPLASTY ANTERIOR APPROACH;  Surgeon: Kathlynn Sharper, MD;  Location: [facility];  Service: Orthopedics;  Laterality: Right;    Social History   Socioeconomic History   Marital status: Divorced    Spouse name: Not on file   Number of children: 1   Years of education: 2 years college   Highest education level: Not on file  Occupational History   Occupation: retired    Comment: worked at [employer]  Tobacco Use   Smoking status: Former    Current packs/day: 0.00     Average packs/day: 0.7 packs/day for 4.0 years (2.8 ttl pk-yrs)    Types: Cigarettes    Start date: 01/12/1962    Quit date: 01/12/1966    Years since quitting: 57.9   Smokeless tobacco: Never  Vaping Use   Vaping status: Never Used  Substance and Sexual Activity   Alcohol use: No    Alcohol/week: 0.0 standard drinks of alcohol   Drug use: No   Sexual activity: Not on file  Other Topics Concern   Not on file  Social History Narrative   Not on file   Social Drivers of Health   Financial Resource Strain: High Risk (  11/22/2023)   Received from [facility]   Overall Financial Resource Strain (CARDIA)    Difficulty of Paying Living Expenses: Very hard  Food Insecurity: Food Insecurity Present (11/22/2023)   Received from [facility]   Hunger Vital Sign    Within the past 12 months, you worried that your food would run out before you got the money to buy more.: Never true    Within the past 12 months, the food you bought just didn't last and you didn't have money to get more.: Often true  Transportation Needs: No Transportation Needs (11/22/2023)   Received from [facility]    In the past 12 months, has lack of transportation kept you from medical appointments or from getting medications?: No    Lack of Transportation (Non-Medical): No  Physical Activity: Not on file  Stress: Not on file  Social Connections: Not on file  Intimate Partner Violence: Not At Risk (10/22/2021)   Humiliation, Afraid, Rape, and Kick questionnaire    Fear of Current or Ex-Partner: No    Emotionally Abused: No    Physically Abused: No    Sexually Abused: No    Family History  Problem Relation Age of Onset   Diabetes Mother        ovarian cancer   Hypertension Mother    Ovarian cancer Mother    Diabetes Father        hardening of arteries   Heart disease Father    Hypertension Father    Diabetes Sister    Heart disease  Sister    Diabetes Brother    Heart disease Brother    Allergies  Allergen Reactions   Ceftriaxone      Abnormal LFTS    Hydralazine Itching   Meloxicam Other (See Comments)    Causes excessive sleepiness fainting   Pioglitazone Itching   Tramadol Other (See Comments)    Causes excessive sleepiness Dizzy/fainting   Empagliflozin Palpitations   I? Current Outpatient Medications  Medication Sig Dispense Refill   albuterol  (PROVENTIL  HFA;VENTOLIN  HFA) 108 (90 Base) MCG/ACT inhaler Inhale 2 puffs into the lungs every 6 (six) hours as needed for wheezing or shortness of breath. 1 Inhaler 0   allopurinol  (ZYLOPRIM ) 100 MG tablet Take by mouth.     amLODipine  (NORVASC ) 5 MG tablet Take 5 mg by mouth daily.      apixaban  (ELIQUIS ) 2.5 MG TABS tablet Take 1 tablet (2.5 mg total) by mouth 2 (two) times daily. 60 tablet 6   budesonide-formoterol  (SYMBICORT) 160-4.5 MCG/ACT inhaler Inhale 2 puffs into the lungs 2 (two) times daily.      Calcium  Carbonate-Vitamin D3 (CALCIUM  600/VITAMIN D) 600-400 MG-UNIT TABS Take 2 tablets by mouth daily.     cetirizine (ZYRTEC) 10 MG tablet Take 10 mg by mouth at bedtime.      ciprofloxacin (CIPRO) 500 MG tablet Take 500 mg by mouth 2 (two) times daily. for 7 days     diclofenac  sodium (VOLTAREN ) 1 % GEL Apply topically 2 (two) times daily as needed (pain).     docusate sodium  (COLACE) 100 MG capsule Take 1 capsule (100 mg total) by mouth 2 (two) times daily. 10 capsule 0   ferrous sulfate 325 (65 FE) MG tablet Take 325 mg by mouth 2 (two) times daily with a meal.     fexofenadine (ALLEGRA) 180 MG tablet Take 180 mg by mouth daily.      isosorbide  mononitrate (  IMDUR ) 60 MG 24 hr tablet Take 180 mg by mouth daily.      JARDIANCE 25 MG TABS tablet Take 25 mg by mouth daily.     magnesium  hydroxide (MILK OF MAGNESIA) 400 MG/5ML suspension Take 30 mLs by mouth every 4 (four) hours as needed. If constipation/ no BM for 2 days     mometasone  (NASONEX ) 50 MCG/ACT  nasal spray Place 2 sprays into the nose daily as needed (allergies).      montelukast  (SINGULAIR ) 10 MG tablet Take 10 mg by mouth every evening.      omeprazole (PRILOSEC) 20 MG capsule      sitaGLIPtin (JANUVIA) 50 MG tablet Take 50 mg by mouth daily.     spironolactone  (ALDACTONE ) 25 MG tablet Take 25 mg by mouth daily.      tiotropium (SPIRIVA ) 18 MCG inhalation capsule Place 18 mcg into inhaler and inhale daily.     vitamin B-12 (CYANOCOBALAMIN ) 1000 MCG tablet Take 1,000 mcg by mouth daily.     vitamin C  (ASCORBIC ACID ) 500 MG tablet Take 500 mg by mouth daily.      doxycycline  (VIBRA -TABS) 100 MG tablet TAKE 1 TABLET TWICE DAILY (Patient not taking: Reported on 11/30/2023) 60 tablet 11   MYRBETRIQ 25 MG TB24 tablet Take 25 mg by mouth daily.     No current facility-administered medications for this visit.     Abtx:  Anti-infectives (From admission, onward)    None       REVIEW OF SYSTEMS:  Const: negative fever, negative chills, negative weight loss Eyes: negative diplopia or visual changes, negative eye pain ENT: negative coryza, negative sore throat Resp: negative cough, hemoptysis, dyspnea Cards: negative for chest pain, palpitations, lower extremity edema GU: negative for frequency, dysuria and hematuria GI: Negative for abdominal pain, diarrhea, bleeding, constipation Skin:  N Heme: negative for easy bruising and gum/nose bleeding MS: rt hip pain resolved Neurolo:negative for headaches, dizziness, vertigo, memory problems  Psych: negative for feelings of anxiety, depression  Endocrine: negative for thyroid , diabetes Allergy/Immunology-PCN rash  Objective:  VITALS:  BP (!) 162/73   Pulse 67   Temp (!) 97.3 F (36.3 C) (Temporal)   Ht 5' 3 (1.6 m)   Wt 176 lb (79.8 kg)   LMP 05/28/1969 (Approximate) Comment: hysterectomy  SpO2 98%   BMI 31.18 kg/m   PHYSICAL EXAM:  General: Alert, cooperative, no distress, appears stated age.  Head: Normocephalic,  without obvious abnormality, atraumatic. Eyes: Conjunctivae clear, anicteric sclerae. Pupils are equal ENT Nares normal. No drainage or sinus tenderness. Lips, mucosa, and tongue normal. No Thrush Neck: Supple, symmetrical, no adenopathy, thyroid : non tender no carotid bruit and no JVD. Back: No CVA tenderness. Lungs: Clear to auscultation bilaterally. No Wheezing or Rhonchi. No rales. Heart: Regular rate and rhythm, no murmur, rub or gallop. Abdomen: Soft, non-tender,not distended. Bowel sounds normal. No masses Extremities:Normal  Rthip surgical site healed well Ambullates well Lymph: Cervical, supraclavicular normal. Neurologic: Grossly non-focal Pertinent Labs Lab Results  09/23/21 ESR 56 ( 100 on 09/16/21) CRP 10 ( range 0-10) 12/18/21 ESR 81 CRP 1.7    Microbiology: Joint capsule Actinomyces  Superficial layer - actinomyces Middle layer soft tissue actinomyces  ? Impression/Recommendation Rt total hip replacement in July 2018  Actinomyces infection-of PJI right hip  on 09/04/21 underwent irrigation and debridement of rt hip, excision of heterotopic calcification  Completed 6 weeks of IV antibiotic on 10/23/21- Ceftriaxone  ws changed to ertapenem  because of Abnoral LFTS  9/18 .  LFTS normalized Started on Amoxicillin , but because of pruritus switched to Doxy Nov 2023. Last esr 49 from April 2025 ( was 100 in sept 2023) and crp <0.5  After 18 months on Doxy We stopped Doxy in October  and her appetite has improved and she is doing well - No hip pain and she is walking well   Recent Uti with pseudomonas- is on cipro given by her PCP   H/o rt arm DVT related to PICC in Oct 2023 S/p mechanical thrombectomy and was  on eliquis - until 11/2022. Discontinued by [facility] her cardiologist    Will need dental prophylaxis   DM on januvia   HTN on amlodipine , spirinolactone   CKD stable  Discussed the management with patient?  Follow up in 6 months if needed

## 2024-01-03 ENCOUNTER — Other Ambulatory Visit: Payer: Self-pay | Admitting: Internal Medicine

## 2024-01-03 DIAGNOSIS — R001 Bradycardia, unspecified: Secondary | ICD-10-CM

## 2024-01-03 DIAGNOSIS — R0602 Shortness of breath: Secondary | ICD-10-CM

## 2024-02-24 ENCOUNTER — Other Ambulatory Visit

## 2024-05-30 ENCOUNTER — Ambulatory Visit: Admitting: Infectious Diseases
